# Patient Record
Sex: Female | Born: 1950 | Race: White | Hispanic: No | Smoking: Former smoker
Health system: Southern US, Community
[De-identification: ages and names within clinical notes are randomized; demographics above are authoritative.]

## PROBLEM LIST (undated history)

## (undated) DIAGNOSIS — C4491 Basal cell carcinoma of skin, unspecified: Secondary | ICD-10-CM

## (undated) DIAGNOSIS — C259 Malignant neoplasm of pancreas, unspecified: Secondary | ICD-10-CM

## (undated) DIAGNOSIS — R0789 Other chest pain: Secondary | ICD-10-CM

## (undated) DIAGNOSIS — IMO0001 Reserved for inherently not codable concepts without codable children: Secondary | ICD-10-CM

## (undated) DIAGNOSIS — F329 Major depressive disorder, single episode, unspecified: Secondary | ICD-10-CM

## (undated) DIAGNOSIS — K219 Gastro-esophageal reflux disease without esophagitis: Secondary | ICD-10-CM

## (undated) DIAGNOSIS — F32A Depression, unspecified: Secondary | ICD-10-CM

## (undated) DIAGNOSIS — J449 Chronic obstructive pulmonary disease, unspecified: Secondary | ICD-10-CM

## (undated) DIAGNOSIS — M549 Dorsalgia, unspecified: Secondary | ICD-10-CM

## (undated) DIAGNOSIS — R1084 Generalized abdominal pain: Secondary | ICD-10-CM

## (undated) DIAGNOSIS — I10 Essential (primary) hypertension: Secondary | ICD-10-CM

## (undated) DIAGNOSIS — F419 Anxiety disorder, unspecified: Secondary | ICD-10-CM

## (undated) DIAGNOSIS — R0609 Other forms of dyspnea: Secondary | ICD-10-CM

## (undated) DIAGNOSIS — R0600 Dyspnea, unspecified: Secondary | ICD-10-CM

## (undated) HISTORY — PX: CORONARY ARTERY BYPASS GRAFT: SHX141

## (undated) HISTORY — PX: TONSILLECTOMY: SUR1361

## (undated) HISTORY — DX: Gastro-esophageal reflux disease without esophagitis: K21.9

## (undated) HISTORY — PX: CARPAL TUNNEL RELEASE: SHX101

## (undated) HISTORY — PX: COLONOSCOPY: SHX174

## (undated) HISTORY — DX: Chronic obstructive pulmonary disease, unspecified: J44.9

---

## 1998-05-18 ENCOUNTER — Other Ambulatory Visit: Admission: RE | Admit: 1998-05-18 | Discharge: 1998-05-18 | Payer: Self-pay | Admitting: *Deleted

## 1998-05-31 ENCOUNTER — Ambulatory Visit (HOSPITAL_COMMUNITY): Admission: RE | Admit: 1998-05-31 | Discharge: 1998-05-31 | Payer: Self-pay | Admitting: *Deleted

## 1998-06-04 ENCOUNTER — Ambulatory Visit (HOSPITAL_COMMUNITY): Admission: RE | Admit: 1998-06-04 | Discharge: 1998-06-04 | Payer: Self-pay | Admitting: *Deleted

## 1998-06-04 ENCOUNTER — Encounter: Payer: Self-pay | Admitting: *Deleted

## 1998-06-07 ENCOUNTER — Encounter: Payer: Self-pay | Admitting: *Deleted

## 1998-06-09 ENCOUNTER — Ambulatory Visit (HOSPITAL_COMMUNITY): Admission: RE | Admit: 1998-06-09 | Discharge: 1998-06-09 | Payer: Self-pay | Admitting: *Deleted

## 2004-05-20 ENCOUNTER — Emergency Department: Payer: Self-pay | Admitting: Unknown Physician Specialty

## 2004-05-20 ENCOUNTER — Other Ambulatory Visit: Payer: Self-pay

## 2008-06-30 DIAGNOSIS — I10 Essential (primary) hypertension: Secondary | ICD-10-CM | POA: Insufficient documentation

## 2009-07-03 ENCOUNTER — Emergency Department: Payer: Self-pay | Admitting: Emergency Medicine

## 2010-04-13 ENCOUNTER — Emergency Department: Payer: Self-pay | Admitting: Emergency Medicine

## 2011-04-03 DIAGNOSIS — C4491 Basal cell carcinoma of skin, unspecified: Secondary | ICD-10-CM

## 2011-04-03 HISTORY — DX: Basal cell carcinoma of skin, unspecified: C44.91

## 2011-04-03 HISTORY — PX: BASAL CELL CARCINOMA EXCISION: SHX1214

## 2012-04-10 ENCOUNTER — Encounter (HOSPITAL_COMMUNITY): Payer: Self-pay | Admitting: Emergency Medicine

## 2012-04-10 ENCOUNTER — Emergency Department (HOSPITAL_COMMUNITY)
Admission: EM | Admit: 2012-04-10 | Discharge: 2012-04-10 | Disposition: A | Discharge status: Home / Self Care | Payer: Self-pay | Attending: Emergency Medicine | Admitting: Emergency Medicine

## 2012-04-10 DIAGNOSIS — Z8659 Personal history of other mental and behavioral disorders: Secondary | ICD-10-CM | POA: Insufficient documentation

## 2012-04-10 DIAGNOSIS — Z79899 Other long term (current) drug therapy: Secondary | ICD-10-CM | POA: Insufficient documentation

## 2012-04-10 DIAGNOSIS — R6889 Other general symptoms and signs: Secondary | ICD-10-CM | POA: Insufficient documentation

## 2012-04-10 DIAGNOSIS — IMO0001 Reserved for inherently not codable concepts without codable children: Secondary | ICD-10-CM | POA: Insufficient documentation

## 2012-04-10 DIAGNOSIS — R059 Cough, unspecified: Secondary | ICD-10-CM | POA: Insufficient documentation

## 2012-04-10 DIAGNOSIS — R899 Unspecified abnormal finding in specimens from other organs, systems and tissues: Secondary | ICD-10-CM

## 2012-04-10 DIAGNOSIS — M2550 Pain in unspecified joint: Secondary | ICD-10-CM | POA: Insufficient documentation

## 2012-04-10 DIAGNOSIS — Z87891 Personal history of nicotine dependence: Secondary | ICD-10-CM | POA: Insufficient documentation

## 2012-04-10 DIAGNOSIS — R5381 Other malaise: Secondary | ICD-10-CM | POA: Insufficient documentation

## 2012-04-10 DIAGNOSIS — R05 Cough: Secondary | ICD-10-CM | POA: Insufficient documentation

## 2012-04-10 DIAGNOSIS — R509 Fever, unspecified: Secondary | ICD-10-CM | POA: Insufficient documentation

## 2012-04-10 HISTORY — DX: Depression, unspecified: F32.A

## 2012-04-10 HISTORY — DX: Dorsalgia, unspecified: M54.9

## 2012-04-10 HISTORY — DX: Major depressive disorder, single episode, unspecified: F32.9

## 2012-04-10 LAB — CBC WITH DIFFERENTIAL/PLATELET
Eosinophils Absolute: 0.1 10*3/uL (ref 0.0–0.7)
Hemoglobin: 15.2 g/dL — ABNORMAL HIGH (ref 12.0–15.0)
Lymphocytes Relative: 31 % (ref 12–46)
Lymphs Abs: 2.2 10*3/uL (ref 0.7–4.0)
MCH: 31.1 pg (ref 26.0–34.0)
Monocytes Relative: 8 % (ref 3–12)
Neutro Abs: 4.2 10*3/uL (ref 1.7–7.7)
Neutrophils Relative %: 60 % (ref 43–77)
RBC: 4.89 MIL/uL (ref 3.87–5.11)
WBC: 7.1 10*3/uL (ref 4.0–10.5)

## 2012-04-10 LAB — URINALYSIS, ROUTINE W REFLEX MICROSCOPIC
Bilirubin Urine: NEGATIVE
Nitrite: NEGATIVE
Specific Gravity, Urine: <1.005 — ABNORMAL LOW (ref 1.005–1.030)
pH: 5.5 (ref 5.0–8.0)

## 2012-04-10 LAB — BASIC METABOLIC PANEL
BUN: 9 mg/dL (ref 6–23)
Chloride: 103 mEq/L (ref 96–112)
GFR calc Af Amer: >90 mL/min (ref >90)
Glucose, Bld: 87 mg/dL (ref 70–99)
Potassium: 4.5 mEq/L (ref 3.5–5.1)

## 2012-04-10 NOTE — ED Provider Notes (Signed)
History     CSN: [id number]  Arrival date & time 04/10/12  4098   First MD Initiated Contact with Patient 04/10/12 [id number]      Chief Complaint  Patient presents with  . Abnormal Lab    (Consider location/radiation/quality/duration/timing/severity/associated sxs/prior treatment) HPI Comments: Patient is a 62 year old female who presented to her primary physician earlier this week due to to generally not feeling well, having body aches, and at times coughing up phlegm with clots of blood in it. She was told by her physician that she may have a" bacterial infection" and she was started on antibiotics. The patient also received blood work at that time. The patient was called by her physicians and told that her potassium was 6.6 and she should go to the emergency department on last evening. The patient did not have a ride and presents to the emergency department today for evaluation of this elevated potassium. A the patient denies palpitations. There's been no numbness or tingling. There has been some fatigue and weakness present. The patient denies any polyuria or excessive thirst. She has not taken anything for this issue at this time.  The history is provided by the patient.    Past Medical History  Diagnosis Date  . Back pain   . Depression     Past Surgical History  Procedure Date  . Basal cell carcinoma excision     History reviewed. No pertinent family history.  History  Substance Use Topics  . Smoking status: Former Smoker    Quit date: 03/02/2012  . Smokeless tobacco: Not on file  . Alcohol Use: No    OB History    Grav Para Term Preterm Abortions TAB SAB Ect Mult Living                  Review of Systems  Constitutional: Positive for fever and fatigue. Negative for activity change.       All ROS Neg except as noted in HPI  HENT: Negative for nosebleeds and neck pain.   Eyes: Negative for photophobia and discharge.  Respiratory: Positive for cough. Negative for  shortness of breath and wheezing.   Cardiovascular: Negative for chest pain and palpitations.  Gastrointestinal: Negative for abdominal pain and blood in stool.  Genitourinary: Negative for dysuria, frequency and hematuria.  Musculoskeletal: Positive for myalgias and arthralgias. Negative for back pain.  Skin: Negative.   Neurological: Negative for dizziness, seizures and speech difficulty.  Psychiatric/Behavioral: Negative for hallucinations and confusion.    Allergies  Cortisone; Penicillins; and Tramadol  Home Medications   Current Outpatient Rx  Name  Route  Sig  Dispense  Refill  . DICYCLOMINE HCL 10 MG PO CAPS   Oral   Take 10 mg by mouth 4 (four) times daily.         Marland Kitchen METRONIDAZOLE 500 MG PO TABS   Oral   Take 500 mg by mouth 2 (two) times daily. For 7 days started on 04/08/12         . QUETIAPINE FUMARATE 100 MG PO TABS   Oral   Take 50 mg by mouth at bedtime.           BP 159/117  Pulse 102  SpO2 98%  Physical Exam  Nursing note and vitals reviewed. Constitutional: She is oriented to person, place, and time. She appears well-developed and well-nourished.  Non-toxic appearance.  HENT:  Head: Normocephalic.  Right Ear: Tympanic membrane and external ear normal.  Left Ear: Tympanic membrane  and external ear normal.  Eyes: EOM and lids are normal. Pupils are equal, round, and reactive to light.  Neck: Normal range of motion. Neck supple. Carotid bruit is not present.  Cardiovascular: Normal rate, regular rhythm, normal heart sounds, intact distal pulses and normal pulses.   Pulmonary/Chest: No respiratory distress.       Course breath sounds, no focal consolidation. Symmetrical rise and fall of the chest. Pt speaking in complete sentences.  Abdominal: Soft. Bowel sounds are normal. There is no tenderness. There is no guarding.  Musculoskeletal: Normal range of motion.  Lymphadenopathy:       Head (right side): No submandibular adenopathy present.        Head (left side): No submandibular adenopathy present.    She has no cervical adenopathy.  Neurological: She is alert and oriented to person, place, and time. She has normal strength. No cranial nerve deficit or sensory deficit.  Skin: Skin is warm and dry.  Psychiatric: Her speech is normal. Her mood appears anxious.    ED Course  Procedures (including critical care time)   Labs Reviewed  BASIC METABOLIC PANEL   No results found.   No diagnosis found.    MDM  I have reviewed nursing notes, vital signs, and all appropriate lab and imaging results for this patient. Patient conversing with family member during the entire emergency room visit in complete sentences without any problem whatsoever. There was no hemoptysis noted during this emergency department visit. The patient was told that her potassium was elevated at 6.6 by her primary care physician. The physician requested the patient be evaluated and recheck. The electrocardiogram showed no peak T waves or other abnormalities. The basic metabolic panel was completely normal the potassium at that time was 4.5. The urine analysis was well within normal limits. In the complete blood count was normal.  The patient was informed of these findings. The patient ambulated in the [facility] without any problem whatsoever. It was felt to be safe for the patient be discharged home she will have close followup by her primary care physician.       Kathie Dike, [place] 04/16/12 2225

## 2012-04-10 NOTE — ED Notes (Signed)
Pt went to doctor due to coughing up blood clot. ptstaes also has had diffuse generalized body aches for months. Pt states doctor told her she had bacterial infection and started her on antibiotics. Pt states doctors called and told her K+ level was 6.6.

## 2012-04-17 NOTE — ED Provider Notes (Signed)
Medical screening examination/treatment/procedure(s) were performed by non-physician practitioner and as supervising physician I was immediately available for consultation/collaboration.    [name] M [name], DO 04/17/12 1716 

## 2013-09-10 DIAGNOSIS — J449 Chronic obstructive pulmonary disease, unspecified: Secondary | ICD-10-CM | POA: Insufficient documentation

## 2014-12-27 ENCOUNTER — Ambulatory Visit: Payer: Self-pay | Attending: Oncology

## 2015-01-03 ENCOUNTER — Ambulatory Visit
Admission: RE | Admit: 2015-01-03 | Discharge: 2015-01-03 | Disposition: A | Discharge status: Home / Self Care | Payer: Self-pay | Source: Ambulatory Visit | Attending: Oncology | Admitting: Oncology

## 2015-01-03 ENCOUNTER — Encounter: Payer: Self-pay | Admitting: *Deleted

## 2015-01-03 ENCOUNTER — Ambulatory Visit: Payer: Self-pay | Attending: Oncology | Admitting: *Deleted

## 2015-01-03 VITALS — BP 145/95 | HR 74 | Temp 96.3°F | Ht 63.0 in | Wt 117.0 lb

## 2015-01-03 DIAGNOSIS — Z1231 Encounter for screening mammogram for malignant neoplasm of breast: Secondary | ICD-10-CM | POA: Insufficient documentation

## 2015-01-03 DIAGNOSIS — Z0000 Encounter for general adult medical examination without abnormal findings: Secondary | ICD-10-CM

## 2015-01-03 NOTE — Progress Notes (Signed)
Subjective:     Patient ID: Shirley Barton, female   DOB: 1950/12/26, 64 y.o.   MRN: [id number]  HPI   Review of Systems     Objective:   Physical Exam  Pulmonary/Chest: Right breast exhibits no inverted nipple, no mass, no nipple discharge, no skin change and no tenderness. Left breast exhibits no inverted nipple, no mass, no nipple discharge, no skin change and no tenderness. Breasts are symmetrical.  Bilateral induration noted under bilateral nipples when patient raises her arms       Assessment:     64 year old White female presents to [facility] for clinical breast exam and mammogram.  Patient has never had a mammogram.  Taught self breast awareness.  Patient had a flat affect.  Stated she had depression but could not take her meds because they made her sick.  On clinical breast exam the patient could barely raise her arms, and could only get them to a 45 degree position, due to pain.  Asked if she could tolerate a mammogram, and she did states she would do it.   The breast exam is unremarkable except for bilateral tenderness.  Taught self breast awareness.  Patient has been screened for eligibility.  She does not have any insurance, Medicare or Medicaid.  She also meets financial eligibility.  Hand-out given on the Affordable Care Act.     Plan:     Screening mammogram ordered.  Numbers given to [facility] clinics for further evaluation and treatment of her depression.  Will follow-up per protocol.

## 2015-01-04 ENCOUNTER — Encounter: Payer: Self-pay | Admitting: *Deleted

## 2015-01-04 NOTE — Progress Notes (Signed)
Letter mailed from the [facility] to inform patient of her normal mammogram results.  Patient is to follow-up with annual screening in one year.  HSIS to Christy. 

## 2015-02-07 IMAGING — US US ABDOMEN LIMITED
1 series · 14 of 25 positions shown · non-contrast
Comparison: None.

CLINICAL DATA: Initial evaluation for acute right-sided abdominal
pain, vomiting.

EXAM:
US ABDOMEN LIMITED - RIGHT UPPER QUADRANT

[Series 1: us abdomen limited · 0.22mm/px · 14 of 44 slices shown]
[im 1/44]
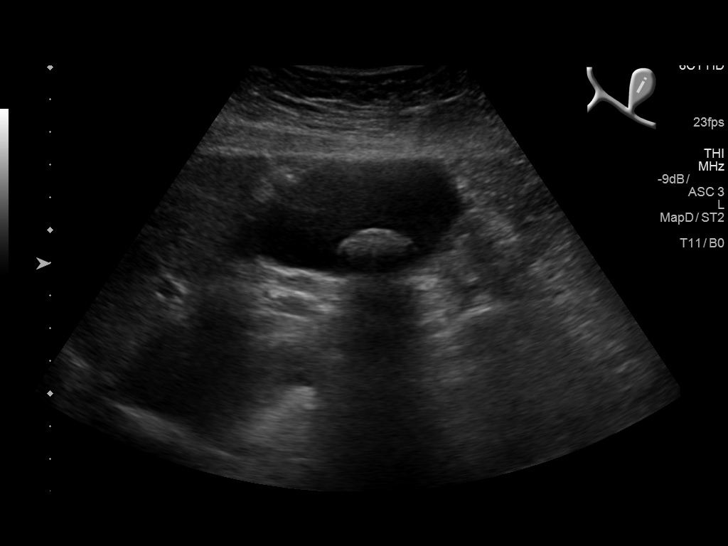
[im 4/44]
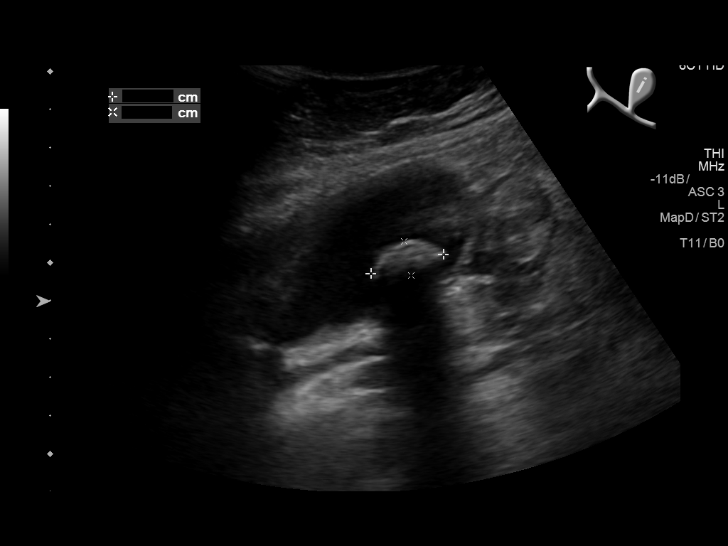
[im 8/44]
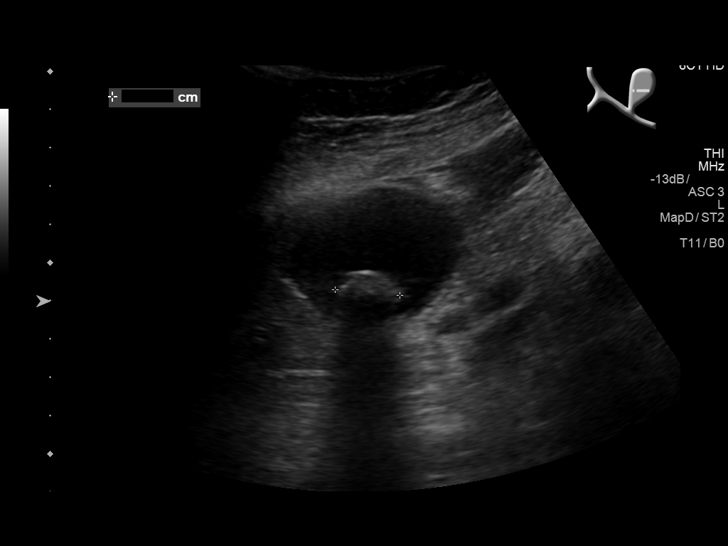
[im 11/44]
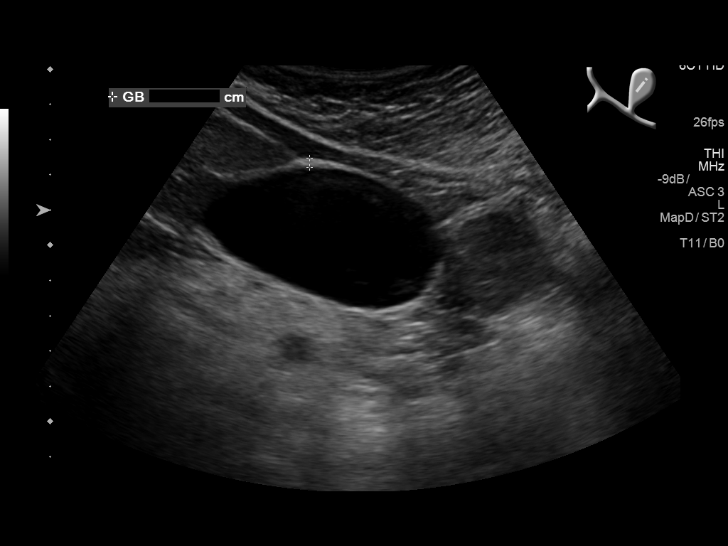
[im 15/44]
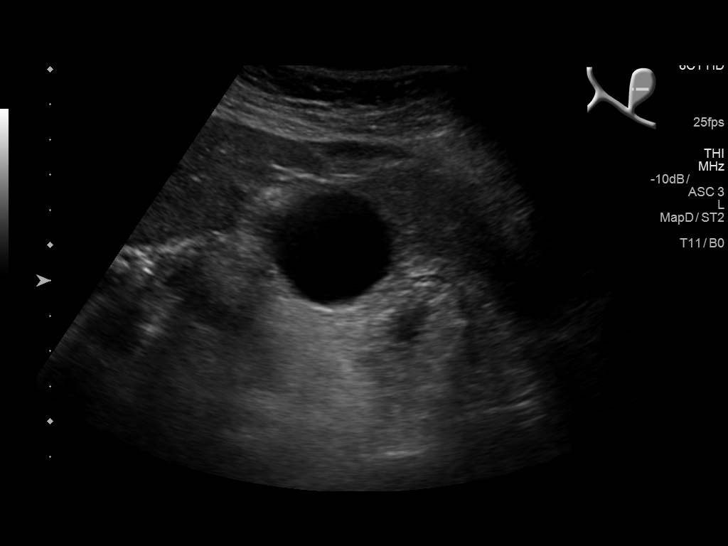
[im 17/44]
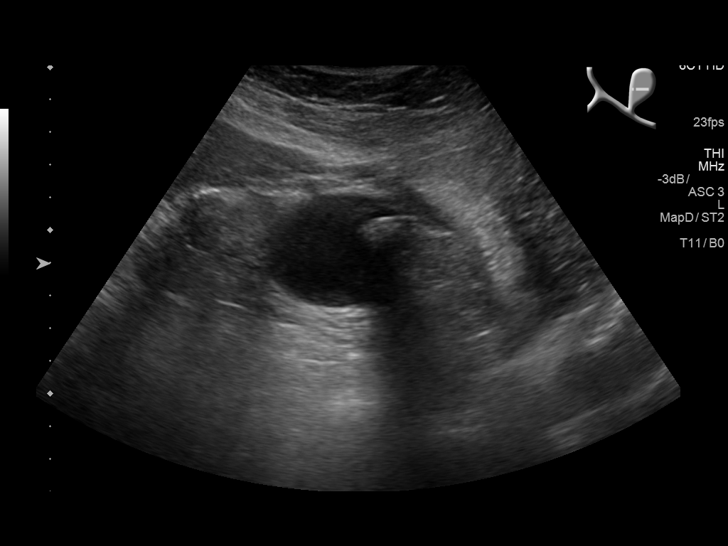
[im 20/44]
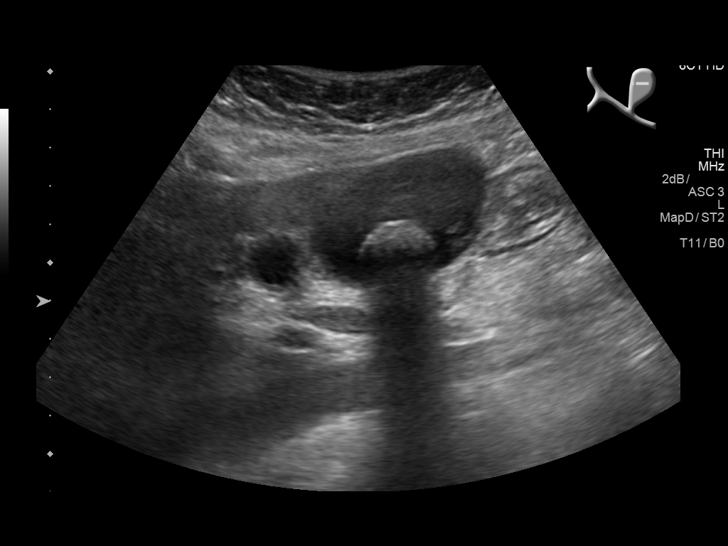
[im 24/44]
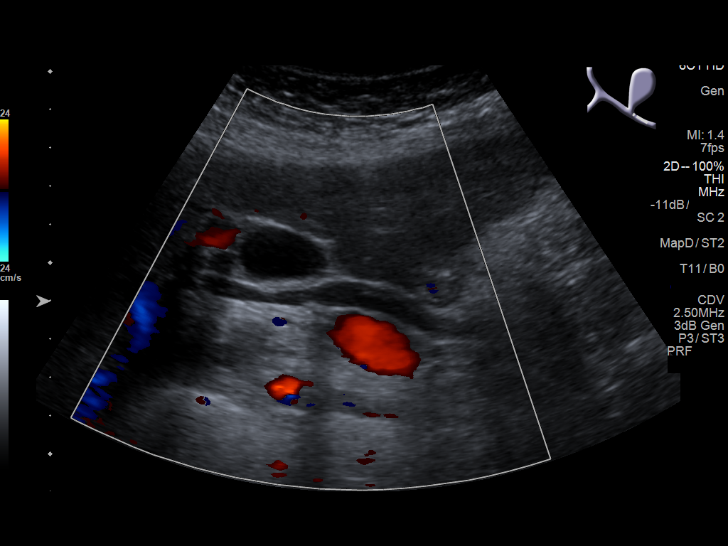
[im 27/44]
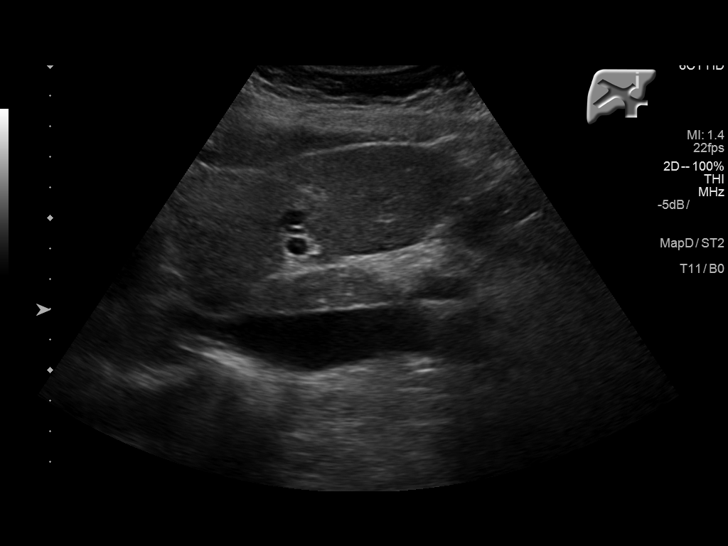
[im 29/44]
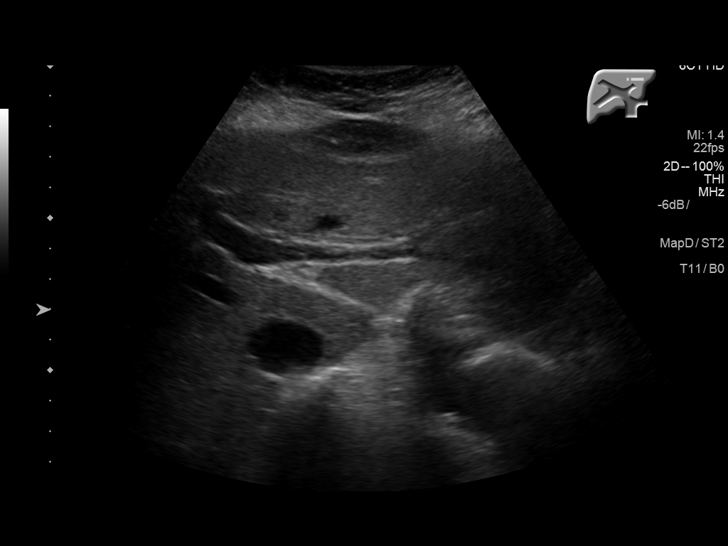
[im 33/44]
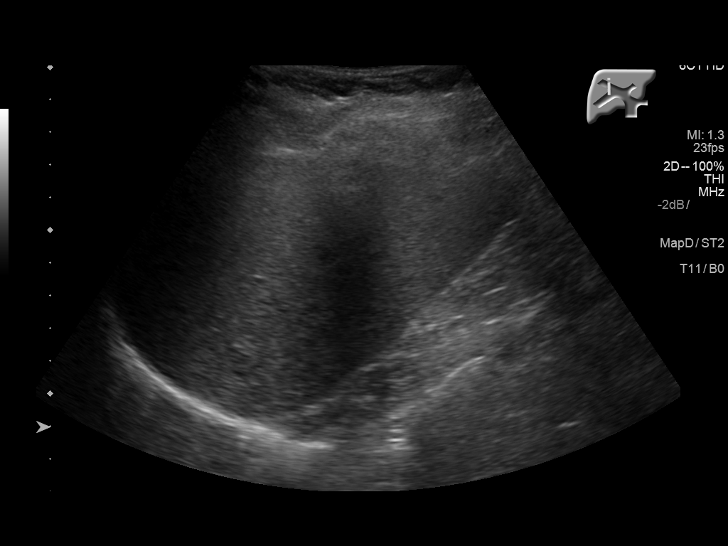
[im 36/44]
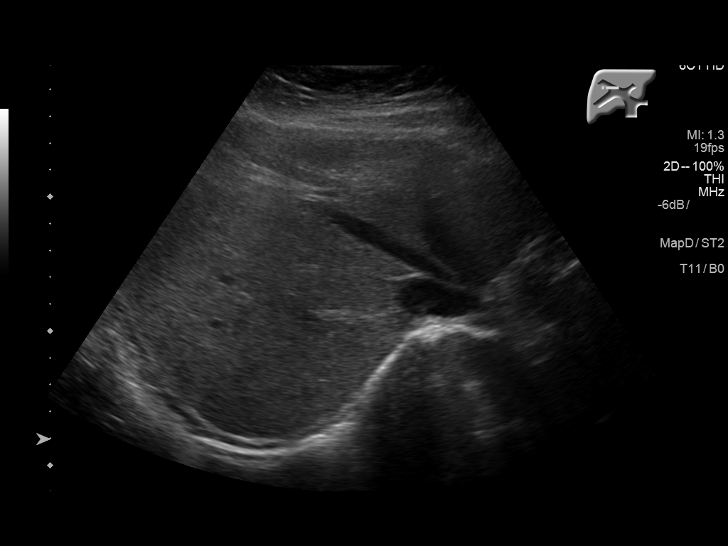
[im 40/44]
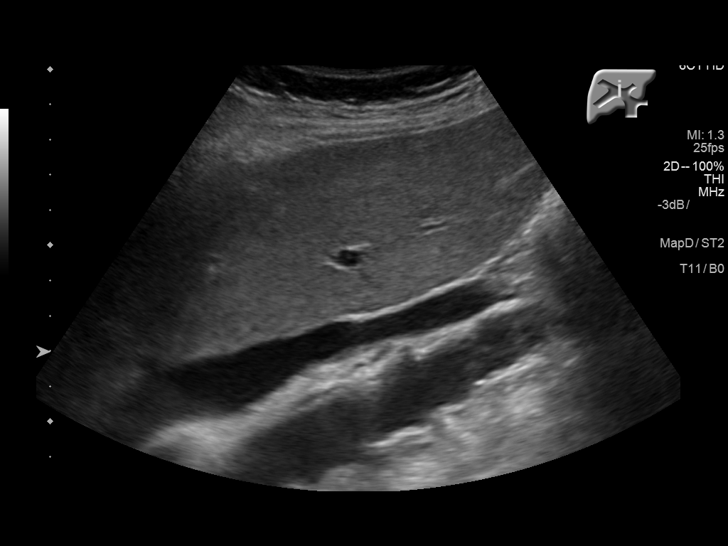
[im 44/44]
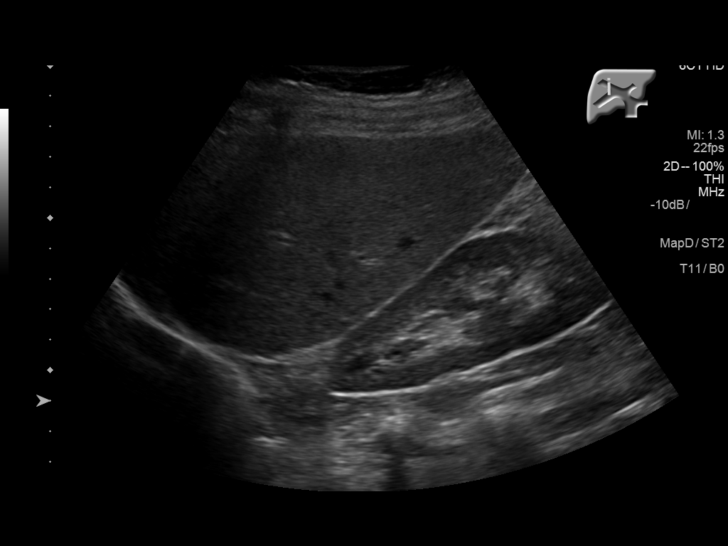

[14 of 25 positions shown; findings below may reference images not displayed]

FINDINGS: Gallbladder:

2 cm shadowing echogenic stone present within the gallbladder lumen.
Small amount of gallbladder sludge. Gallbladder wall measure within
normal limits at 2-3 mm. No free pericholecystic fluid. The
evaluation for sonographic Murphy's sign limited as the patient was
medicated.

Common bile duct:

Diameter: 6 mm

Liver:

No focal lesion identified. Within normal limits in parenchymal
echogenicity.
IMPRESSION: Cholelithiasis with gallbladder sludge. No other sonographic
evidence for acute cholecystitis. No biliary dilatation.

## 2015-08-28 ENCOUNTER — Emergency Department: Payer: Medicare Other

## 2015-08-28 ENCOUNTER — Encounter: Payer: Self-pay | Admitting: Radiology

## 2015-08-28 ENCOUNTER — Emergency Department
Admission: EM | Admit: 2015-08-28 | Discharge: 2015-08-28 | Disposition: A | Discharge status: Home / Self Care | Payer: Medicare Other | Attending: Emergency Medicine | Admitting: Emergency Medicine

## 2015-08-28 DIAGNOSIS — F329 Major depressive disorder, single episode, unspecified: Secondary | ICD-10-CM | POA: Diagnosis not present

## 2015-08-28 DIAGNOSIS — Z87891 Personal history of nicotine dependence: Secondary | ICD-10-CM | POA: Insufficient documentation

## 2015-08-28 DIAGNOSIS — R1031 Right lower quadrant pain: Secondary | ICD-10-CM | POA: Diagnosis present

## 2015-08-28 DIAGNOSIS — K8020 Calculus of gallbladder without cholecystitis without obstruction: Secondary | ICD-10-CM | POA: Diagnosis not present

## 2015-08-28 DIAGNOSIS — R109 Unspecified abdominal pain: Secondary | ICD-10-CM

## 2015-08-28 LAB — CBC
HCT: 47 % (ref 35.0–47.0)
Hemoglobin: 15.9 g/dL (ref 12.0–16.0)
MCH: 29.9 pg (ref 26.0–34.0)
MCHC: 33.9 g/dL (ref 32.0–36.0)
MCV: 88.2 fL (ref 80.0–100.0)
PLATELETS: 177 10*3/uL (ref 150–440)
RBC: 5.33 MIL/uL — ABNORMAL HIGH (ref 3.80–5.20)
RDW: 13.3 % (ref 11.5–14.5)
WBC: 9.7 10*3/uL (ref 3.6–11.0)

## 2015-08-28 LAB — COMPREHENSIVE METABOLIC PANEL
ALT: 17 U/L (ref 14–54)
AST: 22 U/L (ref 15–41)
Albumin: 4.3 g/dL (ref 3.5–5.0)
Alkaline Phosphatase: 54 U/L (ref 38–126)
Anion gap: 9 (ref 5–15)
BILIRUBIN TOTAL: 0.6 mg/dL (ref 0.3–1.2)
BUN: 12 mg/dL (ref 6–20)
CALCIUM: 9.3 mg/dL (ref 8.9–10.3)
CO2: 26 mmol/L (ref 22–32)
CREATININE: 0.86 mg/dL (ref 0.44–1.00)
Chloride: 103 mmol/L (ref 101–111)
GFR calc Af Amer: >60 mL/min (ref >60)
Glucose, Bld: 113 mg/dL — ABNORMAL HIGH (ref 65–99)
POTASSIUM: 3.7 mmol/L (ref 3.5–5.1)
Sodium: 138 mmol/L (ref 135–145)
TOTAL PROTEIN: 7.4 g/dL (ref 6.5–8.1)

## 2015-08-28 LAB — LIPASE, BLOOD: Lipase: 32 U/L (ref 11–51)

## 2015-08-28 LAB — TROPONIN I: Troponin I: <0.03 ng/mL (ref <0.031)

## 2015-08-28 LAB — URINALYSIS COMPLETE WITH MICROSCOPIC (ARMC ONLY)
BILIRUBIN URINE: NEGATIVE
Bacteria, UA: NONE SEEN
GLUCOSE, UA: NEGATIVE mg/dL
Ketones, ur: NEGATIVE mg/dL
NITRITE: NEGATIVE
Protein, ur: NEGATIVE mg/dL
SPECIFIC GRAVITY, URINE: 1.014 (ref 1.005–1.030)
pH: 5 (ref 5.0–8.0)

## 2015-08-28 IMAGING — CT CT ABD-PELV W/ CM
2 of 5 series · 16 of 46 positions shown, 18 images · IV contrast (iopamidol)
Comparison: None.

CLINICAL DATA: Right-sided abdominal pain 6 days.

EXAM:
CT ABDOMEN AND PELVIS WITH CONTRAST
TECHNIQUE: Multidetector CT imaging of the abdomen and pelvis was performed
using the standard protocol following bolus administration of
intravenous contrast.
CONTRAST:  100mL [UI] IOPAMIDOL ([UI]) INJECTION 61%

[Series 2: routine abd pel with · axial · 0.59mm/px · z∈[-662,-308]mm · 13 of 81 slices shown, 15 images]
[im 5/81  soft-tissue]
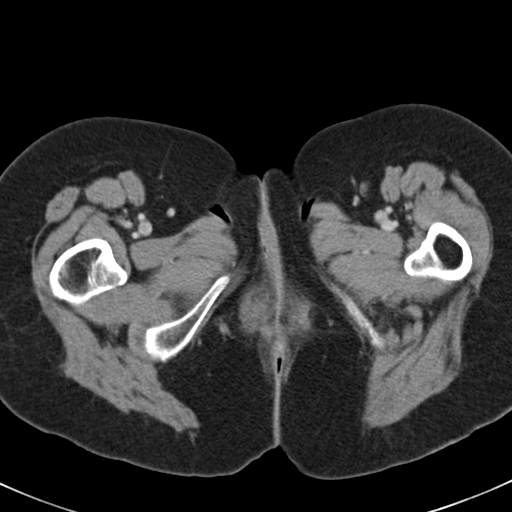
[im 5/81  bone]
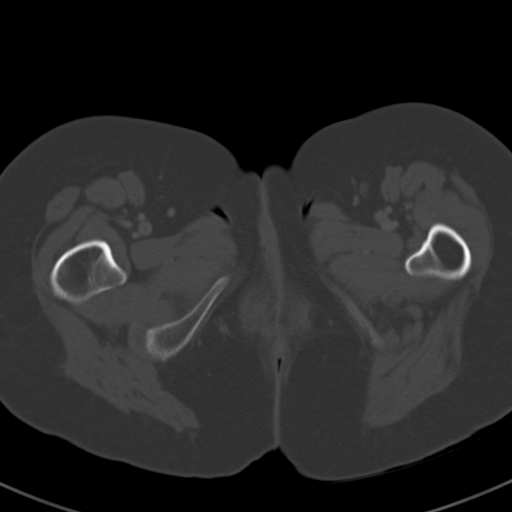
[im 13/81  soft-tissue]
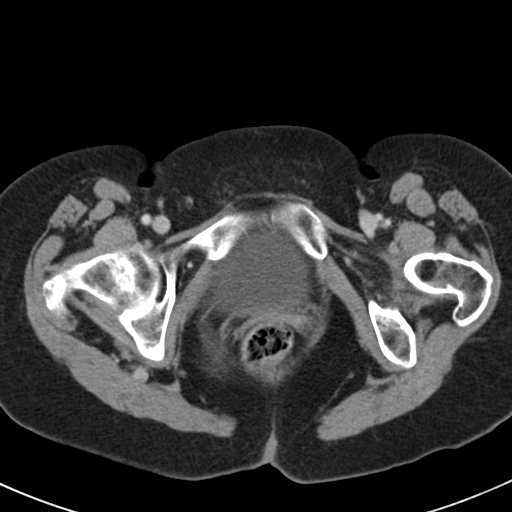
[im 17/81  soft-tissue]
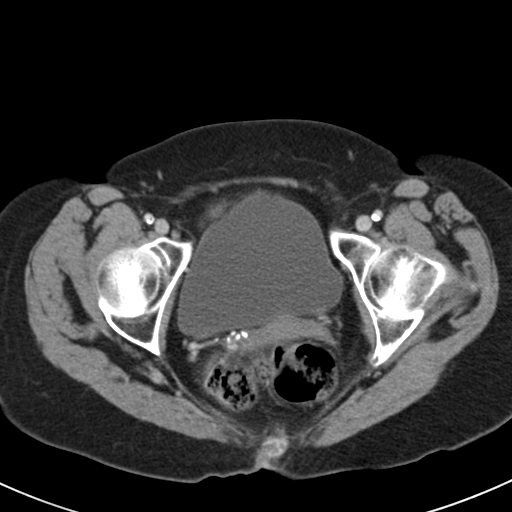
[im 22/81  soft-tissue]
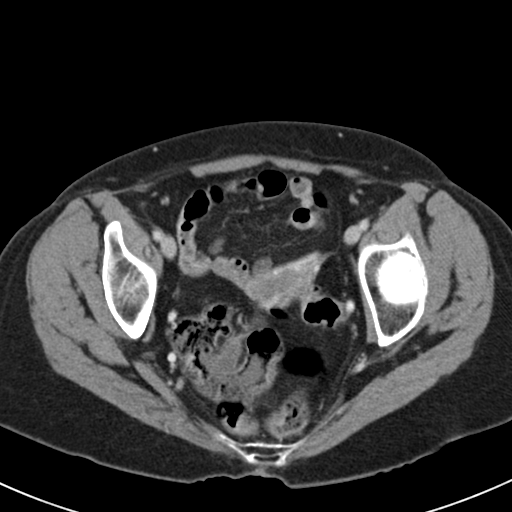
[im 30/81  soft-tissue]
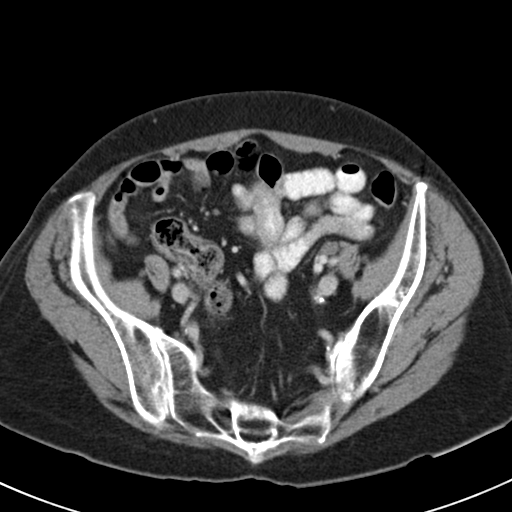
[im 34/81  soft-tissue]
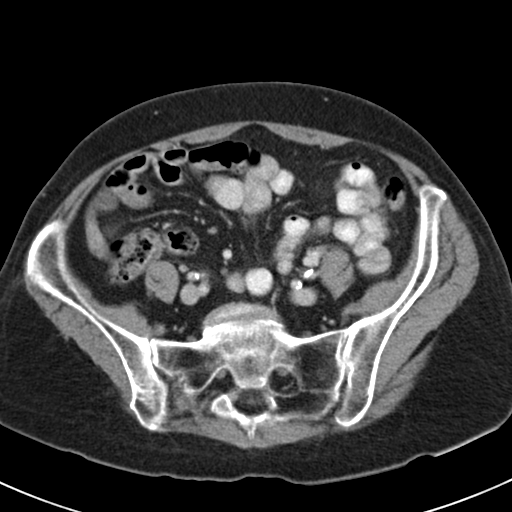
[im 43/81  soft-tissue]
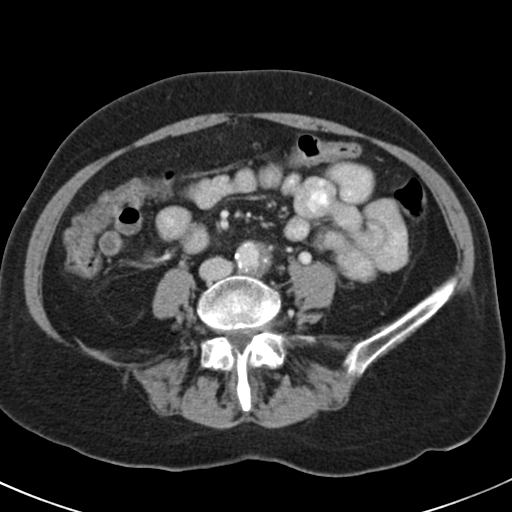
[im 47/81  soft-tissue]
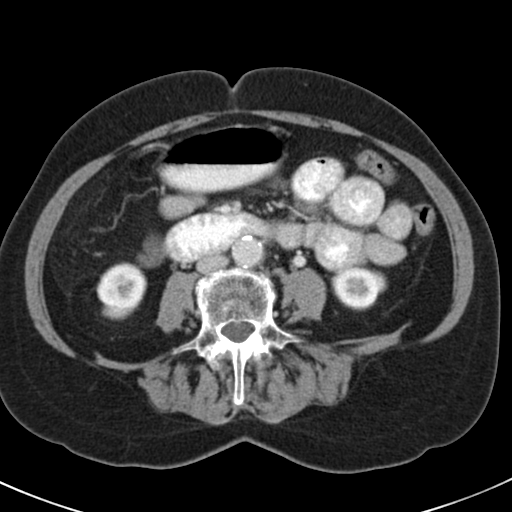
[im 51/81  soft-tissue]
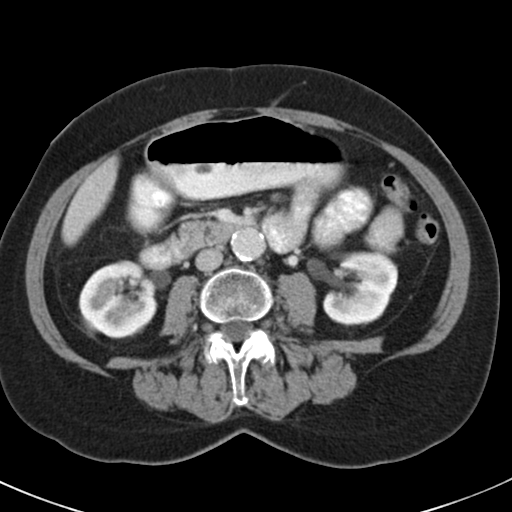
[im 51/81  bone]
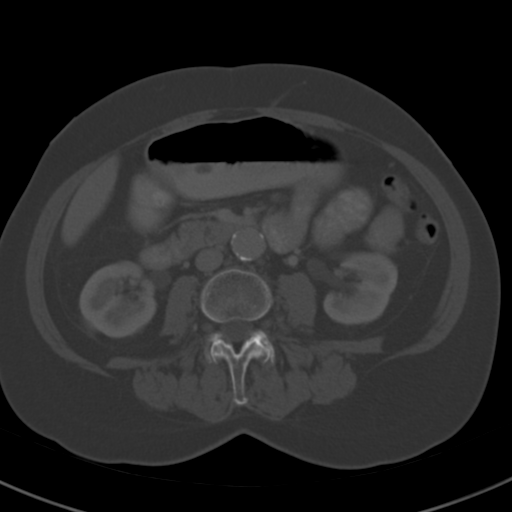
[im 59/81  soft-tissue]
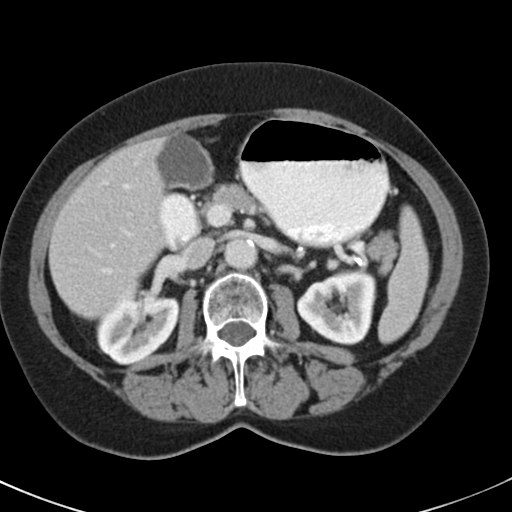
[im 64/81  soft-tissue]
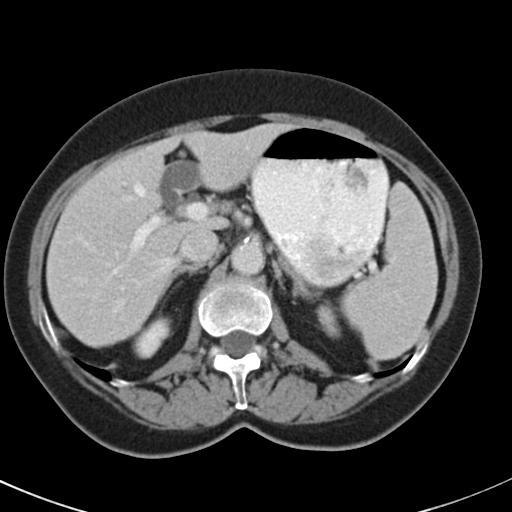
[im 68/81  soft-tissue]
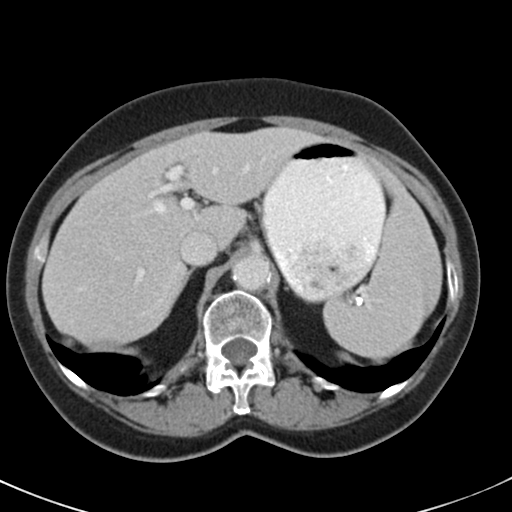
[im 76/81  soft-tissue]
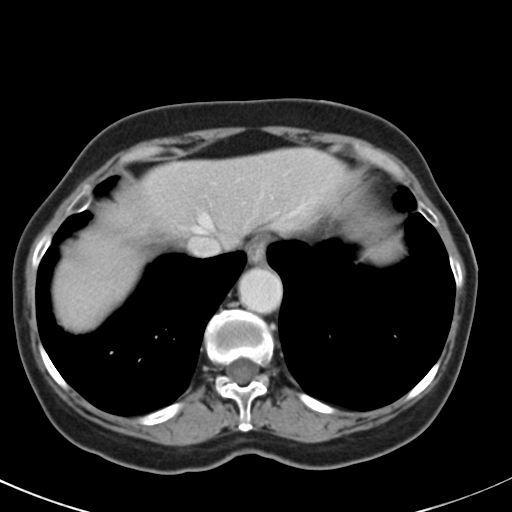

[Series 5: cor routine abd pel with · coronal · 0.56mm/px · 3 of 118 slices shown]
[im 40/118  soft-tissue]
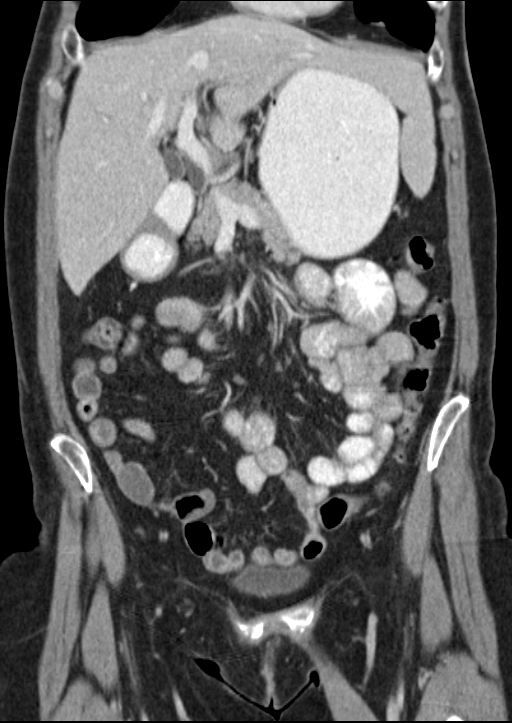
[im 53/118  soft-tissue]
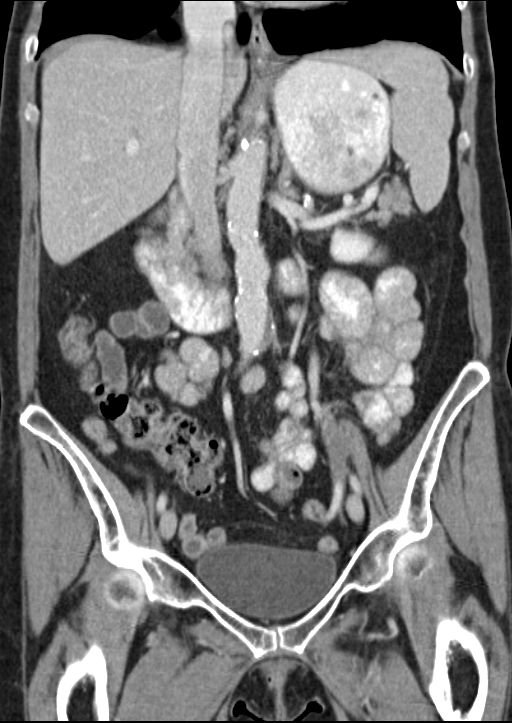
[im 66/118  soft-tissue]
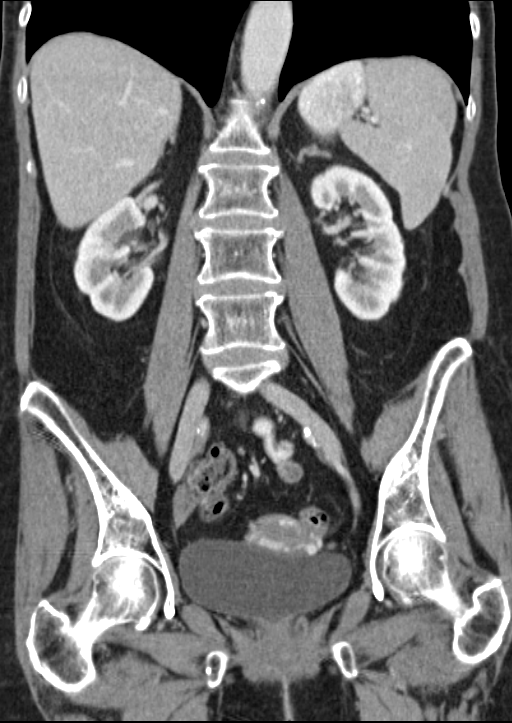

[16 of 46 positions shown; findings below may reference images not displayed]

FINDINGS: Lower chest:  Lung bases clear.  No infiltrate or effusion.

Hepatobiliary: Liver normal in size and contour. No liver lesion.
Gallbladder and bile ducts normal.

Pancreas: Negative

Spleen: Negative

Adrenals/Urinary Tract: Normal and symmetric renal excretion of
contrast. No renal mass or obstruction. No urinary tract calculi.
Urinary bladder normal.

Stomach/Bowel: Negative for bowel obstruction. No bowel mass or
edema. Normal appendix. Cecum deep in the pelvis. Negative for
diverticulitis.

Vascular/Lymphatic: Atherosclerotic calcification in the aorta and
iliacs. No aneurysm. No lymphadenopathy.

Reproductive: Atrophic uterus without mass lesion. Prominent veins
around the uterus and right adnexa. Prominent left gonadal vein. No
pelvic mass.

Other: No free-fluid

Musculoskeletal: No significant skeletal abnormality.
IMPRESSION: No acute abnormality. No cause for right abdominal pain identified.
Normal appendix.

## 2015-08-28 MED ORDER — ONDANSETRON HCL 4 MG/2ML IJ SOLN
4.0000 mg | Freq: Once | INTRAMUSCULAR | Status: AC
  Administered 2015-08-28: 4 mg via INTRAVENOUS

## 2015-08-28 MED ORDER — ONDANSETRON 4 MG PO TBDP: 4.0000 mg | ORAL_TABLET | Freq: Four times a day (QID) | ORAL | Status: DC | PRN

## 2015-08-28 MED ORDER — DIATRIZOATE MEGLUMINE & SODIUM 66-10 % PO SOLN
15.0000 mL | Freq: Once | ORAL | Status: AC
  Administered 2015-08-28: 15 mL via ORAL

## 2015-08-28 MED ORDER — MORPHINE SULFATE (PF) 2 MG/ML IV SOLN
INTRAVENOUS | Status: AC
  Filled 2015-08-28: qty 1

## 2015-08-28 MED ORDER — IOPAMIDOL (ISOVUE-300) INJECTION 61%
100.0000 mL | Freq: Once | INTRAVENOUS | Status: AC | PRN
  Administered 2015-08-28: 100 mL via INTRAVENOUS
  Filled 2015-08-28: qty 100

## 2015-08-28 MED ORDER — MORPHINE SULFATE (PF) 4 MG/ML IV SOLN
4.0000 mg | Freq: Once | INTRAVENOUS | Status: AC
  Administered 2015-08-28: 4 mg via INTRAVENOUS

## 2015-08-28 MED ORDER — SODIUM CHLORIDE 0.9 % IV BOLUS (SEPSIS)
1000.0000 mL | Freq: Once | INTRAVENOUS | Status: AC
  Administered 2015-08-28: 1000 mL via INTRAVENOUS

## 2015-08-28 MED ORDER — ONDANSETRON HCL 4 MG/2ML IJ SOLN
INTRAMUSCULAR | Status: AC
  Filled 2015-08-28: qty 2

## 2015-08-28 MED ORDER — MORPHINE SULFATE (PF) 2 MG/ML IV SOLN
2.0000 mg | Freq: Once | INTRAVENOUS | Status: AC
  Administered 2015-08-28: 2 mg via INTRAVENOUS

## 2015-08-28 MED ORDER — MORPHINE SULFATE (PF) 4 MG/ML IV SOLN
INTRAVENOUS | Status: AC
  Administered 2015-08-28: 4 mg via INTRAVENOUS
  Filled 2015-08-28: qty 1

## 2015-08-28 NOTE — ED Notes (Addendum)
Pt reports right side abd pain for past 6 days and long with multiple episode of vomiting. Denies diarrhea.

## 2015-08-28 NOTE — ED Provider Notes (Signed)
[facility] Emergency Department Provider Note  ____________________________________________  Time seen: Approximately 5:12 PM  I have reviewed the triage vital signs and the nursing notes.   HISTORY  Chief Complaint Abdominal Pain    HPI Shirley Barton is a 65 y.o. female history of hypertension, depression, and chronic lower back pain.  Patient reports she's had increasing pain over the right side of the abdomen with some nausea, occasional episodes of vomiting, and increasing discomfort primarily over the right lower abdomen. She thought she might have "appendicitis."  She reports she has vomited up acid. She is having some radiation of pain into the right lower back. No injury. No weakness numbness or tingling in the lower legs. She has chronic poor circulation in the feet, but reports no changes.     Past Medical History  Diagnosis Date  . Back pain   . Depression     There are no active problems to display for this patient.   Past Surgical History  Procedure Laterality Date  . Basal cell carcinoma excision      Current Outpatient Rx  Name  Route  Sig  Dispense  Refill  . dicyclomine (BENTYL) 10 MG capsule   Oral   Take 10 mg by mouth 4 (four) times daily.         . metroNIDAZOLE (FLAGYL) 500 MG tablet   Oral   Take 500 mg by mouth 2 (two) times daily. For 7 days started on 04/08/12         . ondansetron (ZOFRAN ODT) 4 MG disintegrating tablet   Oral   Take 1 tablet (4 mg total) by mouth every 6 (six) hours as needed for nausea or vomiting.   20 tablet   0   . QUEtiapine (SEROQUEL) 100 MG tablet   Oral   Take 50 mg by mouth at bedtime.           Allergies Cortisone; Tramadol; Cortizone-10; and Penicillins  No family history on file.  Social History Social History  Substance Use Topics  . Smoking status: Former Smoker    Quit date: 03/02/2012  . Smokeless tobacco: None  . Alcohol Use: No    Review of  Systems Constitutional: No fever/chills Eyes: No visual changes. ENT: No sore throat. Cardiovascular: Denies chest pain. Respiratory: Denies shortness of breath. Gastrointestinal: No abdominal pain.  No nausea, no vomiting.  No diarrhea.  No constipation. Genitourinary: Negative for dysuria. Musculoskeletal: Negative for back pain. Skin: Negative for rash. Neurological: Negative for headaches, focal weakness or numbness.  10-point ROS otherwise negative.  ____________________________________________   PHYSICAL EXAM:  VITAL SIGNS: ED Triage Vitals  Enc Vitals Group     BP 08/28/15 1531 155/103 mmHg     Pulse Rate 08/28/15 1531 99     Resp 08/28/15 1531 18     Temp 08/28/15 1531 98.7 F (37.1 C)     Temp Source 08/28/15 1531 Oral     SpO2 08/28/15 1531 95 %     Weight 08/28/15 1531 120 lb (54.432 kg)     Height 08/28/15 1531 5\' 3"  (1.6 m)     Head Cir --      Peak Flow --      Pain Score 08/28/15 1534 10     Pain Loc --      Pain Edu? --      Excl. in [place]? --    Constitutional: Alert and oriented. Well appearing and in no acute distress. Eyes:  Conjunctivae are normal. PERRL. EOMI. Head: Atraumatic. Nose: No congestion/rhinnorhea. Mouth/Throat: Mucous membranes are moist.  Oropharynx non-erythematous. Neck: No stridor.   Cardiovascular: Normal rate, regular rhythm. Grossly normal heart sounds.  Good peripheral circulation. Respiratory: Normal respiratory effort.  No retractions. Lungs CTAB. Gastrointestinal: Soft and nontenderExcept for some moderate discomfort to palpation of the right lower quadrant and right flank without rebound or guarding.. No distention. No abdominal bruits. No CVA tenderness. Musculoskeletal: No lower extremity tenderness nor edema.  No joint effusions.No lumbar or thoracic tenderness. Normal palpable dorsalis pedis and posterior tibial pulses bilateral. 5 out of 5 strength with normal sensation in lower extremities bilateral. Neurologic:  Normal  speech and language. No gross focal neurologic deficits are appreciated. No gait instability. Skin:  Skin is warm, dry and intact. No rash noted. Psychiatric: Mood and affect are normal. Speech and behavior are normal.  ____________________________________________   LABS (all labs ordered are listed, but only abnormal results are displayed)  Labs Reviewed  COMPREHENSIVE METABOLIC PANEL - Abnormal; Notable for the following:    Glucose, Bld 113 (*)    All other components within normal limits  CBC - Abnormal; Notable for the following:    RBC 5.33 (*)    All other components within normal limits  URINALYSIS COMPLETEWITH MICROSCOPIC ([facility] ONLY) - Abnormal; Notable for the following:    Color, Urine YELLOW (*)    APPearance CLEAR (*)    Hgb urine dipstick 2+ (*)    Leukocytes, UA TRACE (*)    Squamous Epithelial / LPF 0-5 (*)    All other components within normal limits  LIPASE, BLOOD  TROPONIN I   ____________________________________________  EKG  ED ECG REPORT I, Kyel Purk, the attending physician, personally viewed and interpreted this ECG.  Date: 08/28/2015 EKG Time: 1540 Rate: 100 Rhythm: normal sinus rhythm QRS Axis: normal Intervals: normal ST/T Wave abnormalities: normal Conduction Disturbances: none Narrative Interpretation: Sinus tachycardia, otherwise normal ____________________________________________  RADIOLOGY  CT Abdomen Pelvis W Contrast (Final result) Result time: 08/28/15 18:29:04   Final result by Rad Results In Interface (08/28/15 18:29:04)   Narrative:   CLINICAL DATA: Right-sided abdominal pain 6 days.  EXAM: CT ABDOMEN AND PELVIS WITH CONTRAST  TECHNIQUE: Multidetector CT imaging of the abdomen and pelvis was performed using the standard protocol following bolus administration of intravenous contrast.  CONTRAST: 176mL ISOVUE-300 IOPAMIDOL (ISOVUE-300) INJECTION 61%  COMPARISON: None.  FINDINGS: Lower chest: Lung bases  clear. No infiltrate or effusion.  Hepatobiliary: Liver normal in size and contour. No liver lesion. Gallbladder and bile ducts normal.  Pancreas: Negative  Spleen: Negative  Adrenals/Urinary Tract: Normal and symmetric renal excretion of contrast. No renal mass or obstruction. No urinary tract calculi. Urinary bladder normal.  Stomach/Bowel: Negative for bowel obstruction. No bowel mass or edema. Normal appendix. Cecum deep in the pelvis. Negative for diverticulitis.  Vascular/Lymphatic: Atherosclerotic calcification in the aorta and iliacs. No aneurysm. No lymphadenopathy.  Reproductive: Atrophic uterus without mass lesion. Prominent veins around the uterus and right adnexa. Prominent left gonadal vein. No pelvic mass.  Other: No free-fluid  Musculoskeletal: No significant skeletal abnormality.  IMPRESSION: No acute abnormality. No cause for right abdominal pain identified. Normal appendix.   Electronically Signed By: Franchot Gallo M.D. On: 08/28/2015 18:29       ____________________________________________   PROCEDURES  Procedure(s) performed: None  Critical Care performed: No  ____________________________________________   INITIAL IMPRESSION / ASSESSMENT AND PLAN / ED COURSE  Pertinent labs & imaging results that were available  during my care of the patient were reviewed by me and considered in my medical decision making (see chart for details).  Differential diagnosis includes but is not limited to, abdominal perforation, aortic dissection, cholecystitis, appendicitis, diverticulitis, colitis, esophagitis/gastritis, kidney stone, pyelonephritis, urinary tract infection, aortic aneurysm. All are considered in decision and treatment plan. Based upon the patient's presentation and risk factors, received a CT scan to evaluate for right-sided etiologies especially appendicitis, cholecystitis, and felt to be less likely an acute vascular disease or other  intra-abdominal etiology such as bowel obstruction.  ----------------------------------------- 7:51 PM on 08/28/2015 -----------------------------------------  Patient reports pain is improved. On repeat exam she has mild tenderness on the right side of the abdomen, but no rebound guarding or peritonitis. She appears comfortable at this time. No vomiting, or nausea. EKG was performed and is appears normal and troponin is negative without acute cardiac or pulmonary complaints.  Her CT does not demonstrate any acute intra-abdominal pathology.  ED ECG REPORT I, Sanchez Hemmer, the attending physician, personally viewed and interpreted this ECG.  Date: 08/28/2015 EKG Time: 1953 Rate: 80 Rhythm: normal sinus rhythm QRS Axis: normal Intervals: normal ST/T Wave abnormalities: normal Conduction Disturbances: none Narrative Interpretation: unremarkable  Discussed with the patient, and after further discussing that there is no clear cause of her pain and she reports that she has had symptoms like this intermittently for quite some time and she seen doctors before and been told that there is no clear cause. Today there is no evidence of acute intra-abdominal pathology are clear cause for her right-sided abdominal pain. She has no cardiac or pulmonary symptoms and her EKG and troponin are normal. I am a little unclear as to cause, and we discussed antiemetics and pain control with careful outpatient follow-up and return precautions which the patient is agreeable to. However, she reports that she is not able to tolerate any medications like tramadol, Percocet or hydrocodone. She will treat her pain with over-the-counter medication, prescription Zofran, and follow-up with her primary.  Return precautions and treatment recommendations and follow-up discussed with the patient who is agreeable with the plan.  Patient's family driving her home    ____________________________________________   FINAL  CLINICAL IMPRESSION(S) / ED DIAGNOSES  Final diagnoses:  Right sided abdominal pain      Delman Kitten, MD 08/28/15 2000

## 2015-08-28 NOTE — Discharge Instructions (Signed)
You were seen in the emergency room for abdominal pain. It is important that you follow up closely with your primary care doctor in the next couple of days.  If you're unable to see her primary care doctor you may return to the emergency room or go to the [place] walk-in clinic in 1 or 2 days for reexam.  Please return to the emergency room right away if you are to develop a fever, severe nausea, your pain becomes severe or worsens, you are unable to keep food down, begin vomiting any dark or bloody fluid, you develop any dark or bloody stools, feel dehydrated, or other new concerns or symptoms arise.   Abdominal Pain, Adult Many things can cause abdominal pain. Usually, abdominal pain is not caused by a disease and will improve without treatment. It can often be observed and treated at home. Your health care provider will do a physical exam and possibly order blood tests and X-rays to help determine the seriousness of your pain. However, in many cases, more time must pass before a clear cause of the pain can be found. Before that point, your health care provider may not know if you need more testing or further treatment. HOME CARE INSTRUCTIONS Monitor your abdominal pain for any changes. The following actions may help to alleviate any discomfort you are experiencing:  Only take over-the-counter or prescription medicines as directed by your health care provider.  Do not take laxatives unless directed to do so by your health care provider.  Try a clear liquid diet (broth, tea, or water) as directed by your health care provider. Slowly move to a bland diet as tolerated. SEEK MEDICAL CARE IF:  You have unexplained abdominal pain.  You have abdominal pain associated with nausea or diarrhea.  You have pain when you urinate or have a bowel movement.  You experience abdominal pain that wakes you in the night.  You have abdominal pain that is worsened or improved by eating food.  You have  abdominal pain that is worsened with eating fatty foods.  You have a fever. SEEK IMMEDIATE MEDICAL CARE IF:  Your pain does not go away within 2 hours.  You keep throwing up (vomiting).  Your pain is felt only in portions of the abdomen, such as the right side or the left lower portion of the abdomen.  You pass bloody or black tarry stools. MAKE SURE YOU:  Understand these instructions.  Will watch your condition.  Will get help right away if you are not doing well or get worse.   This information is not intended to replace advice given to you by your health care provider. Make sure you discuss any questions you have with your health care provider.   Document Released: 12/27/2004 Document Revised: 12/08/2014 Document Reviewed: 11/26/2012 Elsevier Interactive Patient Education [employer].

## 2015-09-01 ENCOUNTER — Other Ambulatory Visit: Payer: Self-pay | Admitting: Student

## 2015-09-01 DIAGNOSIS — M7581 Other shoulder lesions, right shoulder: Secondary | ICD-10-CM

## 2015-09-01 DIAGNOSIS — M25511 Pain in right shoulder: Secondary | ICD-10-CM

## 2015-09-01 DIAGNOSIS — M7541 Impingement syndrome of right shoulder: Secondary | ICD-10-CM

## 2015-09-07 ENCOUNTER — Encounter: Payer: Self-pay | Admitting: *Deleted

## 2015-09-13 ENCOUNTER — Encounter: Payer: Self-pay | Admitting: *Deleted

## 2015-09-14 ENCOUNTER — Other Ambulatory Visit: Payer: Self-pay | Admitting: General Surgery

## 2015-09-14 ENCOUNTER — Encounter: Payer: Self-pay | Admitting: General Surgery

## 2015-09-14 ENCOUNTER — Ambulatory Visit: Payer: Self-pay | Admitting: General Surgery

## 2015-09-14 ENCOUNTER — Ambulatory Visit (INDEPENDENT_AMBULATORY_CARE_PROVIDER_SITE_OTHER): Payer: Medicare Other | Admitting: General Surgery

## 2015-09-14 VITALS — BP 144/84 | HR 68 | Temp 97.9°F | Resp 12 | Ht 64.0 in | Wt 117.0 lb

## 2015-09-14 DIAGNOSIS — K8020 Calculus of gallbladder without cholecystitis without obstruction: Secondary | ICD-10-CM | POA: Diagnosis not present

## 2015-09-14 NOTE — Patient Instructions (Addendum)
The patient is aware to call back for any questions or concerns. Laparoscopic Cholecystectomy Laparoscopic cholecystectomy is surgery to remove the gallbladder. The gallbladder is located in the upper right part of the abdomen, behind the liver. It is a storage sac for bile, which is produced in the liver. Bile aids in the digestion and absorption of fats. Cholecystectomy is often done for inflammation of the gallbladder (cholecystitis). This condition is usually caused by a buildup of gallstones (cholelithiasis) in the gallbladder. Gallstones can block the flow of bile, and that can result in inflammation and pain. In severe cases, emergency surgery may be required. If emergency surgery is not required, you will have time to prepare for the procedure. Laparoscopic surgery is an alternative to open surgery. Laparoscopic surgery has a shorter recovery time. Your common bile duct may also need to be examined during the procedure. If stones are found in the common bile duct, they may be removed. LET [facility] CARE PROVIDER KNOW ABOUT:  Any allergies you have.  All medicines you are taking, including vitamins, herbs, eye drops, creams, and over-the-counter medicines.  Previous problems you or members of your family have had with the use of anesthetics.  Any blood disorders you have.  Previous surgeries you have had.  Any medical conditions you have. RISKS AND COMPLICATIONS Generally, this is a safe procedure. However, problems may occur, including:  Infection.  Bleeding.  Allergic reactions to medicines.  Damage to other structures or organs.  A stone remaining in the common bile duct.  A bile leak from the cyst duct that is clipped when your gallbladder is removed.  The need to convert to open surgery, which requires a larger incision in the abdomen. This may be necessary if your surgeon thinks that it is not safe to continue with a laparoscopic procedure. BEFORE THE PROCEDURE  Ask  your health care provider about:  Changing or stopping your regular medicines. This is especially important if you are taking diabetes medicines or blood thinners.  Taking medicines such as aspirin and ibuprofen. These medicines can thin your blood. Do not take these medicines before your procedure if your health care provider instructs you not to.  Follow instructions from your health care provider about eating or drinking restrictions.  Let your health care provider know if you develop a cold or an infection before surgery.  Plan to have someone take you home after the procedure.  Ask your health care provider how your surgical site will be marked or identified.  You may be given antibiotic medicine to help prevent infection. PROCEDURE  To reduce your risk of infection:  Your health care team will wash or sanitize their hands.  Your skin will be washed with soap.  An IV tube may be inserted into one of your veins.  You will be given a medicine to make you fall asleep (general anesthetic).  A breathing tube will be placed in your mouth.  The surgeon will make several small cuts (incisions) in your abdomen.  A thin, lighted tube (laparoscope) that has a tiny camera on the end will be inserted through one of the small incisions. The camera on the laparoscope will send a picture to a TV screen (monitor) in the operating room. This will give the surgeon a good view inside your abdomen.  A gas will be pumped into your abdomen. This will expand your abdomen to give the surgeon more room to perform the surgery.  Other tools that are needed for  the procedure will be inserted through the other incisions. The gallbladder will be removed through one of the incisions.  After your gallbladder has been removed, the incisions will be closed with stitches (sutures), staples, or skin glue.  Your incisions may be covered with a bandage (dressing). The procedure may vary among health care  providers and hospitals. AFTER THE PROCEDURE  Your blood pressure, heart rate, breathing rate, and blood oxygen level will be monitored often until the medicines you were given have worn off.  You will be given medicines as needed to control your pain.   This information is not intended to replace advice given to you by your health care provider. Make sure you discuss any questions you have with your health care provider.   Document Released: 03/19/2005 Document Revised: 12/08/2014 Document Reviewed: 10/29/2012 Elsevier Interactive Patient Education 2016 [place].   Patient's surgery has been scheduled for 09-16-15 at [facility].

## 2015-09-14 NOTE — H&P (Signed)
HPI Shirley Barton is a 64 y.o. female. Here today for evaluation of gall stones. She states that for a couple of years she has had abdominal pains on and off. This is usually in the right upper quadrant and epigastric area. In November 2016 the pain started getting worse. The pain is in her shoulders, back and under her right ribs. Initially, symptoms were aggravated with food, although there was no particular dietary intolerance. She states the pain has been constant since May. She has been eating liquids, canned fruit and jello for a week. She states clear liquid comes up described as vomit. She does states she has had some diarrhea as well, somewhat explosive at times when the pain is most severe. She states the stool color has been on occasion yellow with mucous, no blood.  The patient reports that her PCP had been somewhat at a loss of the source of her pain. When she questioned the possibility of gallstones she reports being told that her laboratory studies were normal.  CT and ultrasound was 08-28-15 with a trip to the ED. Her weight has fluctuated up and down. No weight loss over 5 pounds over the last month. She is here today with her daughter, Aretha Parrot, who was present for the interview and exam.  HPI  Past Medical History  Diagnosis Date  . Back pain   . Depression   . COPD (chronic obstructive pulmonary disease) ([place])   . GERD (gastroesophageal reflux disease)     Past Surgical History  Procedure Laterality Date  . Basal cell carcinoma excision  2013  . Tonsillectomy    . Colonoscopy  27"s    [place]    Family History  Problem Relation Age of Onset  . Colon polyps Sister   . Cerebral palsy Daughter     Social History Social History  Substance Use Topics  . Smoking status: Former Smoker    Quit date: 03/02/2012  . Smokeless tobacco: Never Used  . Alcohol Use: No    Allergies    Allergen Reactions  . Tramadol   . Cortisone Rash    In 2013  . Cortizone-10 [Hydrocortisone] Rash  . Penicillins Rash    Current Outpatient Prescriptions  Medication Sig Dispense Refill  . Fluticasone-Salmeterol (ADVAIR) 100-50 MCG/DOSE AEPB Inhale 1 puff into the lungs 2 (two) times daily.    . ondansetron (ZOFRAN ODT) 4 MG disintegrating tablet Take 1 tablet (4 mg total) by mouth every 6 (six) hours as needed for nausea or vomiting. 20 tablet 0  . pantoprazole (PROTONIX) 40 MG tablet Take by mouth.    . QUEtiapine (SEROQUEL) 100 MG tablet Take 50 mg by mouth at bedtime.     No current facility-administered medications for this visit.    Review of Systems Review of Systems  Constitutional: Negative.  Respiratory: Negative.  Cardiovascular: Negative.  Gastrointestinal: Positive for nausea, vomiting, abdominal pain and diarrhea.    Blood pressure 144/84, pulse 68, temperature 97.9 F (36.6 C), temperature source Oral, resp. rate 12, height 5\' 4"  (1.626 m), weight 117 lb (53.071 kg), last menstrual period 01/03/1995.  Physical Exam Physical Exam  Constitutional: She is oriented to person, place, and time. She appears well-developed and well-nourished.  HENT:  Mouth/Throat: Oropharynx is clear and moist.  Eyes: Conjunctivae are normal. No scleral icterus.  Neck: Neck supple.  Cardiovascular: Normal rate, regular rhythm and normal heart sounds.  Pulses:  Carotid pulses are 2+ on the right side, and 2+ on the  left side.  Femoral pulses are 2+ on the right side, and 2+ on the left side.  Dorsalis pedis pulses are 1+ on the right side, and 1+ on the left side.   Posterior tibial pulses are 1+ on the right side, and 1+ on the left side.  No lower leg edema.  No carotid bruits identified.  Pulmonary/Chest: Effort normal and breath sounds normal.  Abdominal: Soft. Normal appearance and bowel sounds are  normal. There is tenderness in the right upper quadrant and left upper quadrant. There is no rigidity, no rebound and no guarding.    Lymphadenopathy:   She has no cervical adenopathy.   Right: No inguinal adenopathy present.   Left: No inguinal adenopathy present.  Neurological: She is alert and oriented to person, place, and time.  Skin: Skin is warm and dry.  Psychiatric: Her behavior is normal.    Data Reviewed ED laboratory, CT and ultrasound results independently reviewed. Cholelithiasis without radiologic evidence of acute cholecystitis on ultrasound. CT scan notable for diffuse calcification in the aorta.  GI notes of 09/07/2015 reviewed. History reports dysphagia for solid foods not mentioned today. Plans for upper lower endoscopy when her present subacute abdominal issues have resolved.    Assessment    Symptomatic cholelithiasis.  Family history: Polyps (sister).  History of gastroesophageal reflux.    Plan    Indications for surgery were reviewed. It's unlikely that her reflux symptoms will significantly change after surgery. The possibility that cholecystectomy may not resolve all of her abdominal plain was discussed.     Laparoscopic Cholecystectomy with Intraoperative Cholangiogram. The procedure, including it's potential risks and complications (including but not limited to infection, bleeding, injury to intra-abdominal organs or bile ducts, bile leak, poor cosmetic result, sepsis and death) were discussed with the patient in detail. Non-operative options, including their inherent risks (acute calculous cholecystitis with possible choledocholithiasis or gallstone pancreatitis, with the risk of ascending cholangitis, sepsis, and death) were discussed as well. The patient expressed and understanding of what we discussed and wishes to proceed with laparoscopic cholecystectomy. The patient further understands that if it is technically not possible, or it is  unsafe to proceed laparoscopically, that I will convert to an open cholecystectomy.  Patient's surgery has been scheduled for 09-16-15 at [facility].   PCP: Pricilla Loveless PA This information has been scribed by Karie Fetch RN, BSN,BC.   Robert Bellow 09/14/2015, 10:14 AM

## 2015-09-14 NOTE — Progress Notes (Signed)
Patient ID: Shirley Barton, female   DOB: September 20, 1950, 65 y.o.   MRN: [id number]  Chief Complaint  Patient presents with  . Other    gall stones    HPI Shirley Barton is a 65 y.o. female.  Here today for evaluation of gall stones. She states that for a couple of years she has had abdominal pains on and off. This is usually in the right upper quadrant and epigastric area. In November  2016  the pain started getting worse. The pain is in her shoulders, back and under her right ribs. Initially, symptoms were aggravated with food, although there was no particular dietary intolerance. She states the pain has been constant since May. She has been eating liquids, canned fruit and jello for a week. She states clear liquid comes up described as vomit. She does states she has had some diarrhea as well, somewhat explosive at times when the pain is most severe. She states the stool color has been on occasion yellow with mucous, no blood.  The patient reports that her PCP had been somewhat at a loss of the source of her pain. When she questioned the possibility of gallstones she reports being told that her laboratory studies were normal.  CT and ultrasound was 08-28-15 with a trip to the ED. Her weight has fluctuated up and down. No weight loss over 5 pounds over the last month. She is here today with her daughter, Shirley Barton, who was present for the interview and exam.  HPI  Past Medical History  Diagnosis Date  . Back pain   . Depression   . COPD (chronic obstructive pulmonary disease) ([place])   . GERD (gastroesophageal reflux disease)     Past Surgical History  Procedure Laterality Date  . Basal cell carcinoma excision  2013  . Tonsillectomy    . Colonoscopy  41"s        Family History  Problem Relation Age of Onset  . Colon polyps Sister   . Cerebral palsy Daughter     Social History Social History  Substance Use Topics  . Smoking status: Former Smoker    Quit date:  03/02/2012  . Smokeless tobacco: Never Used  . Alcohol Use: No    Allergies  Allergen Reactions  . Tramadol   . Cortisone Rash    In 2013  . Cortizone-10 [Hydrocortisone] Rash  . Penicillins Rash    Current Outpatient Prescriptions  Medication Sig Dispense Refill  . Fluticasone-Salmeterol (ADVAIR) 100-50 MCG/DOSE AEPB Inhale 1 puff into the lungs 2 (two) times daily.    . ondansetron (ZOFRAN ODT) 4 MG disintegrating tablet Take 1 tablet (4 mg total) by mouth every 6 (six) hours as needed for nausea or vomiting. 20 tablet 0  . pantoprazole (PROTONIX) 40 MG tablet Take by mouth.    . QUEtiapine (SEROQUEL) 100 MG tablet Take 50 mg by mouth at bedtime.     No current facility-administered medications for this visit.    Review of Systems Review of Systems  Constitutional: Negative.   Respiratory: Negative.   Cardiovascular: Negative.   Gastrointestinal: Positive for nausea, vomiting, abdominal pain and diarrhea.    Blood pressure 144/84, pulse 68, temperature 97.9 F (36.6 C), temperature source Oral, resp. rate 12, height 5\' 4"  (1.626 m), weight 117 lb (53.071 kg), last menstrual period 01/03/1995.  Physical Exam Physical Exam  Constitutional: She is oriented to person, place, and time. She appears well-developed and well-nourished.  HENT:  Mouth/Throat: Oropharynx is clear  and moist.  Eyes: Conjunctivae are normal. No scleral icterus.  Neck: Neck supple.  Cardiovascular: Normal rate, regular rhythm and normal heart sounds.   Pulses:      Carotid pulses are 2+ on the right side, and 2+ on the left side.      Femoral pulses are 2+ on the right side, and 2+ on the left side.      Dorsalis pedis pulses are 1+ on the right side, and 1+ on the left side.       Posterior tibial pulses are 1+ on the right side, and 1+ on the left side.  No lower leg edema.  No carotid bruits identified.  Pulmonary/Chest: Effort normal and breath sounds normal.  Abdominal: Soft. Normal  appearance and bowel sounds are normal. There is tenderness in the right upper quadrant and left upper quadrant. There is no rigidity, no rebound and no guarding.    Lymphadenopathy:    She has no cervical adenopathy.       Right: No inguinal adenopathy present.       Left: No inguinal adenopathy present.  Neurological: She is alert and oriented to person, place, and time.  Skin: Skin is warm and dry.  Psychiatric: Her behavior is normal.    Data Reviewed ED laboratory, CT and ultrasound results independently reviewed. Cholelithiasis without radiologic evidence of acute cholecystitis on ultrasound. CT scan notable for diffuse calcification in the aorta.  GI notes of 09/07/2015 reviewed. History reports dysphagia for solid foods not mentioned today. Plans for upper lower endoscopy when her present subacute abdominal issues have resolved.    Assessment    Symptomatic cholelithiasis.  Family history: Polyps (sister).  History of gastroesophageal reflux.    Plan    Indications for surgery were reviewed. It's unlikely that her reflux symptoms will significantly change after surgery. The possibility that cholecystectomy may not resolve all of her abdominal plain was discussed.     Laparoscopic Cholecystectomy with Intraoperative Cholangiogram. The procedure, including it's potential risks and complications (including but not limited to infection, bleeding, injury to intra-abdominal organs or bile ducts, bile leak, poor cosmetic result, sepsis and death) were discussed with the patient in detail. Non-operative options, including their inherent risks (acute calculous cholecystitis with possible choledocholithiasis or gallstone pancreatitis, with the risk of ascending cholangitis, sepsis, and death) were discussed as well. The patient expressed and understanding of what we discussed and wishes to proceed with laparoscopic cholecystectomy. The patient further understands that if it is  technically not possible, or it is unsafe to proceed laparoscopically, that I will convert to an open cholecystectomy.  Patient's surgery has been scheduled for 09-16-15 at [facility].   PCP:  Pricilla Loveless PA This information has been scribed by Karie Fetch RN, BSN,BC.   Robert Bellow 09/14/2015, 10:14 AM

## 2015-09-15 ENCOUNTER — Encounter: Payer: Self-pay | Admitting: *Deleted

## 2015-09-15 ENCOUNTER — Other Ambulatory Visit: Payer: Medicare Other

## 2015-09-15 NOTE — Patient Instructions (Signed)
  Your procedure is scheduled on: 09/16/15 Report to Day Surgery. [facility] SECOND FLOOR To find out your arrival time please call [phone] between 1PM - 3PM on  09/15/15  Remember: Instructions that are not followed completely may result in serious medical risk, up to and including death, or upon the discretion of your surgeon and anesthesiologist your surgery may need to be rescheduled.    __X__ 1. Do not eat food or drink liquids after midnight. No gum chewing or hard candies.     __X__ 2. No Alcohol for 24 hours before or after surgery.   __X__ 3. Do Not Smoke For 24 Hours Prior to Your Surgery.   ____ 4. Bring all medications with you on the day of surgery if instructed.    __X__ 5. Notify your doctor if there is any change in your medical condition     (cold, fever, infections).       Do not wear jewelry, make-up, hairpins, clips or nail polish.  Do not wear lotions, powders, or perfumes. You may wear deodorant.  Do not shave 48 hours prior to surgery. Men may shave face and neck.  Do not bring valuables to the hospital.    [facility] is not responsible for any belongings or valuables.               Contacts, dentures or bridgework may not be worn into surgery.  Leave your suitcase in the car. After surgery it may be brought to your room.  For patients admitted to the hospital, discharge time is determined by your                treatment team.   Patients discharged the day of surgery will not be allowed to drive home.   Please read over the following fact sheets that you were given:   Surgical Site Infection Prevention and Anesthesia Post-op Instructions   _X___ Take these medicines the morning of surgery with A SIP OF WATER:    1. PROTONIX  2.   3.   4.  5.  6.  ____ Fleet Enema (as directed)   ____ Use CHG Soap as directed       COULD NOT COME TO GET CHG SOAP  __X__ Use inhalers on the day of surgery  ____ Stop metformin 2 days prior to  surgery    ____ Take 1/2 of usual insulin dose the night before surgery and none on the morning of surgery.   ____ Stop Coumadin/Plavix/aspirin on   ____ Stop Anti-inflammatories on    ____ Stop supplements until after surgery.    ____ Bring C-Pap to the hospital.

## 2015-09-16 ENCOUNTER — Ambulatory Visit: Payer: Medicare Other | Admitting: Anesthesiology

## 2015-09-16 ENCOUNTER — Encounter: Payer: Self-pay | Admitting: *Deleted

## 2015-09-16 ENCOUNTER — Encounter
Admission: RE | Disposition: A | Discharge status: Home / Self Care | Payer: Self-pay | Source: Ambulatory Visit | Attending: General Surgery

## 2015-09-16 ENCOUNTER — Ambulatory Visit: Payer: Medicare Other

## 2015-09-16 ENCOUNTER — Ambulatory Visit
Admission: RE | Admit: 2015-09-16 | Discharge: 2015-09-16 | Disposition: A | Discharge status: Home / Self Care | Payer: Medicare Other | Source: Ambulatory Visit | Attending: General Surgery | Admitting: General Surgery

## 2015-09-16 DIAGNOSIS — K8010 Calculus of gallbladder with chronic cholecystitis without obstruction: Secondary | ICD-10-CM | POA: Insufficient documentation

## 2015-09-16 DIAGNOSIS — J449 Chronic obstructive pulmonary disease, unspecified: Secondary | ICD-10-CM | POA: Insufficient documentation

## 2015-09-16 DIAGNOSIS — K219 Gastro-esophageal reflux disease without esophagitis: Secondary | ICD-10-CM | POA: Insufficient documentation

## 2015-09-16 DIAGNOSIS — Z820 Family history of epilepsy and other diseases of the nervous system: Secondary | ICD-10-CM | POA: Insufficient documentation

## 2015-09-16 DIAGNOSIS — Z85828 Personal history of other malignant neoplasm of skin: Secondary | ICD-10-CM | POA: Insufficient documentation

## 2015-09-16 DIAGNOSIS — Z79899 Other long term (current) drug therapy: Secondary | ICD-10-CM | POA: Diagnosis not present

## 2015-09-16 DIAGNOSIS — Z880 Allergy status to penicillin: Secondary | ICD-10-CM | POA: Insufficient documentation

## 2015-09-16 DIAGNOSIS — Z87891 Personal history of nicotine dependence: Secondary | ICD-10-CM | POA: Diagnosis not present

## 2015-09-16 DIAGNOSIS — Z8371 Family history of colonic polyps: Secondary | ICD-10-CM | POA: Insufficient documentation

## 2015-09-16 DIAGNOSIS — F329 Major depressive disorder, single episode, unspecified: Secondary | ICD-10-CM | POA: Diagnosis not present

## 2015-09-16 DIAGNOSIS — Z888 Allergy status to other drugs, medicaments and biological substances status: Secondary | ICD-10-CM | POA: Insufficient documentation

## 2015-09-16 DIAGNOSIS — K8044 Calculus of bile duct with chronic cholecystitis without obstruction: Secondary | ICD-10-CM | POA: Diagnosis not present

## 2015-09-16 DIAGNOSIS — K8020 Calculus of gallbladder without cholecystitis without obstruction: Secondary | ICD-10-CM

## 2015-09-16 HISTORY — PX: CHOLECYSTECTOMY: SHX55

## 2015-09-16 IMAGING — CR DG CHOLANGIOGRAM OPERATIVE
1 series · 1 of 1 positions shown · non-contrast
Comparison: Ultrasound [DATE]

CLINICAL DATA: Cholelithiasis

EXAM:
INTRAOPERATIVE CHOLANGIOGRAM
TECHNIQUE: Cholangiographic image from the C-arm fluoroscopic device submitted
for interpretation post-operatively. Please see the procedural
report for the amount of contrast and the fluoroscopy time utilized.

[1]
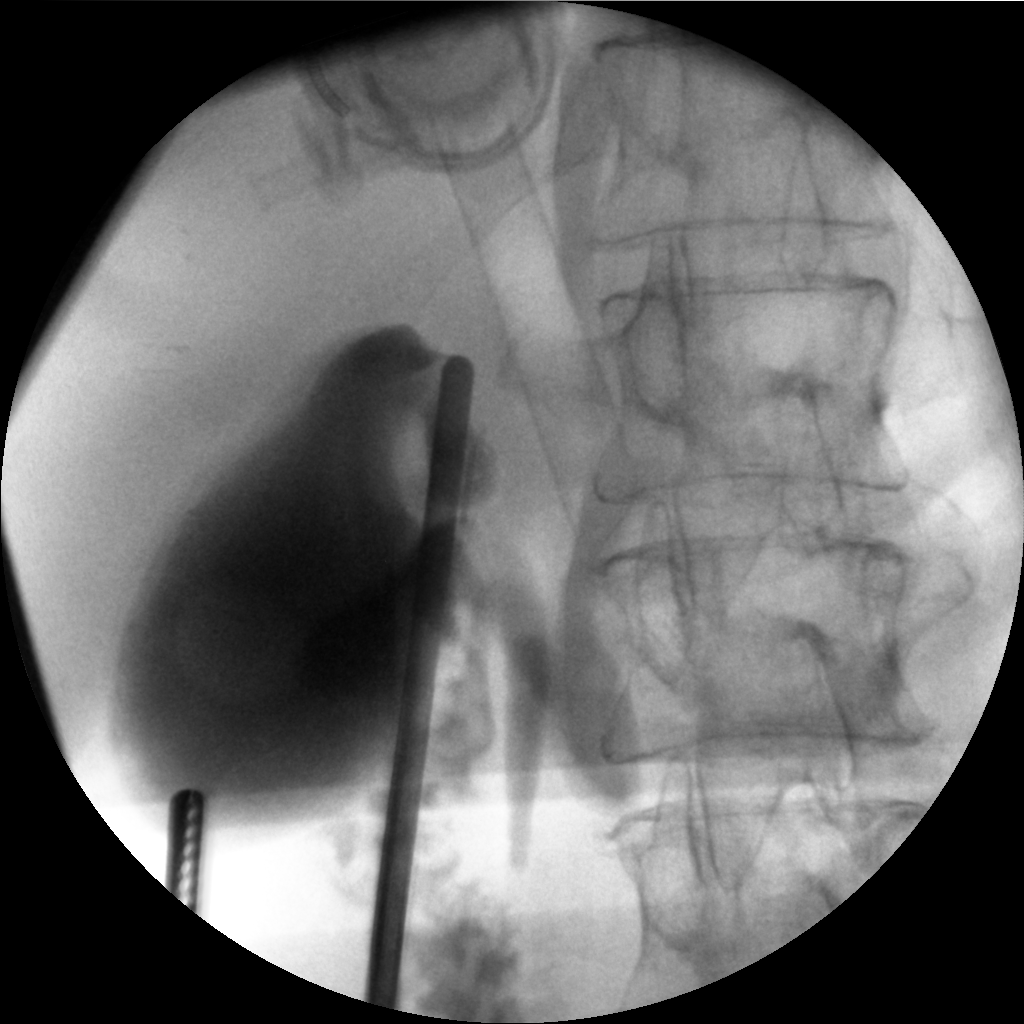

[1 of 1 positions shown; findings below may reference images not displayed]

FINDINGS: incomplete opacification of the CBD. Some contrast is noted in the
duodenum. The intrahepatic ducts are non-opacified. There is
contrast in the gallbladder.
IMPRESSION: 1. Incomplete opacification of CBD.

## 2015-09-16 SURGERY — LAPAROSCOPIC CHOLECYSTECTOMY WITH INTRAOPERATIVE CHOLANGIOGRAM
Anesthesia: General | Wound class: Clean Contaminated

## 2015-09-16 MED ORDER — ROCURONIUM BROMIDE 100 MG/10ML IV SOLN
INTRAVENOUS | Status: DC | PRN
  Administered 2015-09-16: 10 mg via INTRAVENOUS
  Administered 2015-09-16: 40 mg via INTRAVENOUS

## 2015-09-16 MED ORDER — SODIUM CHLORIDE 0.9 % IJ SOLN
INTRAMUSCULAR | Status: AC
  Filled 2015-09-16: qty 50

## 2015-09-16 MED ORDER — EPHEDRINE SULFATE 50 MG/ML IJ SOLN
INTRAMUSCULAR | Status: DC | PRN
  Administered 2015-09-16: 10 mg via INTRAVENOUS

## 2015-09-16 MED ORDER — HYDROMORPHONE HCL 1 MG/ML IJ SOLN
0.2500 mg | INTRAMUSCULAR | Status: DC | PRN
  Administered 2015-09-16 (×2): 0.5 mg via INTRAVENOUS

## 2015-09-16 MED ORDER — HYDROCODONE-ACETAMINOPHEN 5-325 MG PO TABS
1.0000 | ORAL_TABLET | ORAL | Status: DC | PRN
  Administered 2015-09-16: 1 via ORAL

## 2015-09-16 MED ORDER — SUGAMMADEX SODIUM 200 MG/2ML IV SOLN
INTRAVENOUS | Status: DC | PRN
  Administered 2015-09-16: 200 mg via INTRAVENOUS

## 2015-09-16 MED ORDER — METOCLOPRAMIDE HCL 5 MG/ML IJ SOLN
INTRAMUSCULAR | Status: AC
  Filled 2015-09-16: qty 2

## 2015-09-16 MED ORDER — HYDROCODONE-ACETAMINOPHEN 5-325 MG PO TABS: 1.0000 | ORAL_TABLET | ORAL | Status: DC | PRN

## 2015-09-16 MED ORDER — ONDANSETRON HCL 4 MG/2ML IJ SOLN
INTRAMUSCULAR | Status: AC
  Filled 2015-09-16: qty 2

## 2015-09-16 MED ORDER — DEXAMETHASONE SODIUM PHOSPHATE 10 MG/ML IJ SOLN
4.0000 mg | Freq: Once | INTRAMUSCULAR | Status: AC
  Administered 2015-09-16: 4 mg via INTRAVENOUS

## 2015-09-16 MED ORDER — LACTATED RINGERS IV SOLN
INTRAVENOUS | Status: DC
  Administered 2015-09-16 (×3): via INTRAVENOUS

## 2015-09-16 MED ORDER — FENTANYL CITRATE (PF) 100 MCG/2ML IJ SOLN
25.0000 ug | INTRAMUSCULAR | Status: DC | PRN
  Administered 2015-09-16 (×2): 50 ug via INTRAVENOUS
  Administered 2015-09-16 (×2): 25 ug via INTRAVENOUS

## 2015-09-16 MED ORDER — PROPOFOL 10 MG/ML IV BOLUS
INTRAVENOUS | Status: DC | PRN
  Administered 2015-09-16: 120 mg via INTRAVENOUS

## 2015-09-16 MED ORDER — DEXAMETHASONE SODIUM PHOSPHATE 10 MG/ML IJ SOLN
INTRAMUSCULAR | Status: DC | PRN
  Administered 2015-09-16: 10 mg via INTRAVENOUS

## 2015-09-16 MED ORDER — HYDROMORPHONE HCL 1 MG/ML IJ SOLN
INTRAMUSCULAR | Status: AC
  Administered 2015-09-16: 0.5 mg via INTRAVENOUS
  Filled 2015-09-16: qty 1

## 2015-09-16 MED ORDER — LIDOCAINE HCL (CARDIAC) 20 MG/ML IV SOLN
INTRAVENOUS | Status: DC | PRN
  Administered 2015-09-16: 100 mg via INTRAVENOUS

## 2015-09-16 MED ORDER — DEXAMETHASONE SODIUM PHOSPHATE 10 MG/ML IJ SOLN
INTRAMUSCULAR | Status: AC
  Filled 2015-09-16: qty 1

## 2015-09-16 MED ORDER — KETOROLAC TROMETHAMINE 30 MG/ML IJ SOLN
INTRAMUSCULAR | Status: DC | PRN
  Administered 2015-09-16: 30 mg via INTRAVENOUS

## 2015-09-16 MED ORDER — METOCLOPRAMIDE HCL 5 MG/ML IJ SOLN
10.0000 mg | Freq: Once | INTRAMUSCULAR | Status: AC | PRN
  Administered 2015-09-16: 10 mg via INTRAVENOUS

## 2015-09-16 MED ORDER — ACETAMINOPHEN 10 MG/ML IV SOLN
INTRAVENOUS | Status: AC
  Filled 2015-09-16: qty 100

## 2015-09-16 MED ORDER — SUCCINYLCHOLINE CHLORIDE 20 MG/ML IJ SOLN
INTRAMUSCULAR | Status: DC | PRN
  Administered 2015-09-16: 100 mg via INTRAVENOUS

## 2015-09-16 MED ORDER — ONDANSETRON HCL 4 MG/2ML IJ SOLN
INTRAMUSCULAR | Status: DC | PRN
  Administered 2015-09-16: 4 mg via INTRAVENOUS

## 2015-09-16 MED ORDER — MIDAZOLAM HCL 2 MG/2ML IJ SOLN
INTRAMUSCULAR | Status: DC | PRN
  Administered 2015-09-16: 2 mg via INTRAVENOUS

## 2015-09-16 MED ORDER — FENTANYL CITRATE (PF) 100 MCG/2ML IJ SOLN
INTRAMUSCULAR | Status: DC | PRN
  Administered 2015-09-16: 50 ug via INTRAVENOUS
  Administered 2015-09-16: 100 ug via INTRAVENOUS
  Administered 2015-09-16 (×2): 50 ug via INTRAVENOUS

## 2015-09-16 MED ORDER — PROMETHAZINE HCL 25 MG/ML IJ SOLN
12.5000 mg | INTRAMUSCULAR | Status: AC
  Administered 2015-09-16: 12.5 mg via INTRAVENOUS
  Filled 2015-09-16: qty 1

## 2015-09-16 MED ORDER — FENTANYL CITRATE (PF) 100 MCG/2ML IJ SOLN
INTRAMUSCULAR | Status: AC
  Administered 2015-09-16: 50 ug via INTRAVENOUS
  Filled 2015-09-16: qty 2

## 2015-09-16 MED ORDER — ONDANSETRON HCL 4 MG/2ML IJ SOLN
4.0000 mg | Freq: Once | INTRAMUSCULAR | Status: AC
  Administered 2015-09-16: 4 mg via INTRAVENOUS

## 2015-09-16 MED ORDER — ONDANSETRON HCL 4 MG/2ML IJ SOLN
INTRAMUSCULAR | Status: AC
  Administered 2015-09-16: 4 mg via INTRAVENOUS
  Filled 2015-09-16: qty 2

## 2015-09-16 MED ORDER — PHENYLEPHRINE HCL 10 MG/ML IJ SOLN
INTRAMUSCULAR | Status: DC | PRN
  Administered 2015-09-16: 100 ug via INTRAVENOUS

## 2015-09-16 MED ORDER — MIDAZOLAM HCL 5 MG/5ML IJ SOLN
1.0000 mg | Freq: Once | INTRAMUSCULAR | Status: AC
  Administered 2015-09-16: 1 mg via INTRAVENOUS

## 2015-09-16 MED ORDER — ACETAMINOPHEN 10 MG/ML IV SOLN
INTRAVENOUS | Status: DC | PRN
  Administered 2015-09-16: 1000 mg via INTRAVENOUS

## 2015-09-16 MED ORDER — SODIUM CHLORIDE 0.9 % IV SOLN
INTRAVENOUS | Status: DC | PRN
  Administered 2015-09-16: 30 mL

## 2015-09-16 MED ORDER — SODIUM CHLORIDE 0.9 % IJ SOLN
INTRAMUSCULAR | Status: AC
  Filled 2015-09-16: qty 10

## 2015-09-16 MED ORDER — HYDROCODONE-ACETAMINOPHEN 5-325 MG PO TABS
ORAL_TABLET | ORAL | Status: AC
  Filled 2015-09-16: qty 1

## 2015-09-16 MED ORDER — MIDAZOLAM HCL 5 MG/5ML IJ SOLN
INTRAMUSCULAR | Status: AC
  Filled 2015-09-16: qty 5

## 2015-09-16 SURGICAL SUPPLY — 42 items
APPLIER CLIP ROT 10 11.4 M/L (STAPLE) ×2
APR CLP MED LRG 11.4X10 (STAPLE) ×1
BLADE SURG 11 STRL SS SAFETY (MISCELLANEOUS) ×2 IMPLANT
CANISTER SUCT 1200ML W/VALVE (MISCELLANEOUS) ×2 IMPLANT
CANNULA DILATOR 10 W/SLV (CANNULA) ×2 IMPLANT
CANNULA DILATOR 5 W/SLV (CANNULA) ×4 IMPLANT
CATH CHOLANG 76X19 KUMAR (CATHETERS) ×2 IMPLANT
CHLORAPREP W/TINT 26ML (MISCELLANEOUS) ×2 IMPLANT
CLIP APPLIE ROT 10 11.4 M/L (STAPLE) ×1 IMPLANT
CONRAY 60ML FOR OR (MISCELLANEOUS) ×2 IMPLANT
DISSECTOR KITTNER STICK (MISCELLANEOUS) ×1 IMPLANT
DISSECTORS/KITTNER STICK (MISCELLANEOUS)
DRAPE SHEET LG 3/4 BI-LAMINATE (DRAPES) ×2 IMPLANT
DRESSING TELFA 4X3 1S ST N-ADH (GAUZE/BANDAGES/DRESSINGS) ×2 IMPLANT
DRSG TEGADERM 2-3/8X2-3/4 SM (GAUZE/BANDAGES/DRESSINGS) ×8 IMPLANT
DRSG TEGADERM 4X4.75 (GAUZE/BANDAGES/DRESSINGS) ×1 IMPLANT
DRSG TELFA 3X8 NADH (GAUZE/BANDAGES/DRESSINGS) ×2 IMPLANT
ELECT REM PT RETURN 9FT ADLT (ELECTROSURGICAL) ×2
ELECTRODE REM PT RTRN 9FT ADLT (ELECTROSURGICAL) ×1 IMPLANT
ENDOPOUCH RETRIEVER 10 (MISCELLANEOUS) ×1 IMPLANT
GLOVE BIO SURGEON STRL SZ7.5 (GLOVE) ×5 IMPLANT
GLOVE INDICATOR 8.0 STRL GRN (GLOVE) ×4 IMPLANT
GOWN STRL REUS W/ TWL LRG LVL3 (GOWN DISPOSABLE) ×3 IMPLANT
GOWN STRL REUS W/TWL LRG LVL3 (GOWN DISPOSABLE) ×6
IRRIGATION STRYKERFLOW (MISCELLANEOUS) ×1 IMPLANT
IRRIGATOR STRYKERFLOW (MISCELLANEOUS) ×2
IV LACTATED RINGERS 1000ML (IV SOLUTION) ×2 IMPLANT
KIT RM TURNOVER STRD PROC AR (KITS) ×2 IMPLANT
LABEL OR SOLS (LABEL) ×1 IMPLANT
NDL INSUFF ACCESS 14 VERSASTEP (NEEDLE) ×2 IMPLANT
NS IRRIG 500ML POUR BTL (IV SOLUTION) ×2 IMPLANT
PACK LAP CHOLECYSTECTOMY (MISCELLANEOUS) ×2 IMPLANT
PAD DRESSING TELFA 3X8 NADH (GAUZE/BANDAGES/DRESSINGS) IMPLANT
SCISSORS METZENBAUM CVD 33 (INSTRUMENTS) ×2 IMPLANT
SEAL FOR SCOPE WARMER C3101 (MISCELLANEOUS) ×1 IMPLANT
STRIP CLOSURE SKIN 1/2X4 (GAUZE/BANDAGES/DRESSINGS) ×2 IMPLANT
SUT VIC AB 0 CT2 27 (SUTURE) ×2 IMPLANT
SUT VIC AB 4-0 FS2 27 (SUTURE) ×2 IMPLANT
SWABSTK COMLB BENZOIN TINCTURE (MISCELLANEOUS) ×2 IMPLANT
TROCAR XCEL NON-BLD 11X100MML (ENDOMECHANICALS) ×2 IMPLANT
TUBING INSUFFLATOR HI FLOW (MISCELLANEOUS) ×2 IMPLANT
WATER STERILE IRR 1000ML POUR (IV SOLUTION) ×1 IMPLANT

## 2015-09-16 NOTE — Transfer of Care (Signed)
Immediate Anesthesia Transfer of Care Note  Patient: Shirley Barton  Procedure(s) Performed: Procedure(s): LAPAROSCOPIC CHOLECYSTECTOMY WITH INTRAOPERATIVE CHOLANGIOGRAM (N/A)  Patient Location: PACU  Anesthesia Type:General  Level of Consciousness: sedated  Airway & Oxygen Therapy: Patient Spontanous Breathing and Patient connected to face mask oxygen  Post-op Assessment: Report given to RN and Post -op Vital signs reviewed and stable  Post vital signs: Reviewed and stable  Last Vitals:  Filed Vitals:   09/16/15 0748  BP: 130/93  Pulse: 92  Temp: 36.8 C  Resp: 16    Last Pain:  Filed Vitals:   09/16/15 0753  PainSc: 8          Complications: No apparent anesthesia complications

## 2015-09-16 NOTE — Op Note (Signed)
Preoperative diagnosis: Chronic cholecystitis and cholelithiasis.  Postoperative diagnosis: Same.  Operative procedure: Laparoscopic cholecystectomy with intraoperative cholangiograms, lysis of adhesions.  Operating surgeon: Ollen Bowl, M.D.  Anesthesia: Gen. endotracheal.  Estimated blood loss: 5 mL.  Clinical note: This 65 year old woman has had episodic abdominal pain and ultrasound showed evidence of cholelithiasis. She had somewhat atypical symptoms were no other pathology identified she was offered cholecystectomy in an attempt to relieve her abdominal discomfort.  Operative note: With the patient under adequate general endotracheal anesthesia the abdomen was prepped with ChloraPrep and draped. An Trendelenburg position a varies needle was placed through trans-umbilical incision. After assuring intra-abdominal location without pain drop test the abdomen was insufflated with CO2 a 10 mmHg pressure. A 10 mm Depth port was expanded and inspection showed that there were adhesions of the omentum to the anterior abdominal wall. A free space was identified and an 11 mm XL port was placed in the epigastrium. Looking from the epigastric port there were just his band of adhesions that could be taken down once the lateral ports were placed. No evidence of intestinal involvement or injury. No other intra-abdominal pathology. 2-5 mm Step ports were placed under direct vision. The patient was placed in the reverse Trendelenburg position and rolled to the left. The gallbladder was rarely tensely distended but did not require decompression prior to dissection. The neck of the gallbladder was cleared and the cystic duct and cystic duct junction with the common bile duct was identified. Cystic artery was appreciated. Cholangiogram showed free flow in the distal common bile duct and the duodenum. I do repositioning the patient and additional fluid of the proximal portion was not visualized. With clear  visualization of the anatomy the cystic duct was doubly clipped and divided as was the cystic artery. The gallbladder was removed from the liver bed making some hook cautery dissection. It was delivered to the umbilical port site. The gallbladder was opened and a large stone crushed to allow extraction. Inspection showed good hemostasis. The right upper quadrant was irrigated with lactated Ringer solution. The abdomen was then desufflated and ports removed under direct vision. The fascia at the umbilicus was closed with a 0 Vicryl figure-of-eight suture. Skin incisions were closed with 4-0 Vicryl subcuticular suture. Benzoin, Steri-Strips, Telfa and Tegaderm dressings were applied.  The patient tolerated the procedure well and was taken to recovery in stable condition.

## 2015-09-16 NOTE — Progress Notes (Signed)
C/o nausea; Dr. Benjamine Mola notified and ordered decadron. Given as per order

## 2015-09-16 NOTE — Discharge Instructions (Signed)

## 2015-09-16 NOTE — Progress Notes (Signed)
Continues to c/o nausea. Phenergan ordered and given.

## 2015-09-16 NOTE — Anesthesia Preprocedure Evaluation (Signed)
Anesthesia Evaluation  Patient identified by MRN, date of birth, ID band Patient awake    Reviewed: Allergy & Precautions, NPO status , Patient's Chart, lab work & pertinent test results  Airway Mallampati: I  TM Distance: >3 FB Neck ROM: Full    Dental  (+) Upper Dentures, Lower Dentures   Pulmonary COPD,  COPD inhaler, former smoker,    Pulmonary exam normal        Cardiovascular Exercise Tolerance: Good negative cardio ROS   Rhythm:Regular Rate:Normal     Neuro/Psych Depression    GI/Hepatic GERD  Medicated and Controlled,  Endo/Other    Renal/GU      Musculoskeletal   Abdominal Normal abdominal exam  (+)   Peds  Hematology   Anesthesia Other Findings   Reproductive/Obstetrics                             Anesthesia Physical Anesthesia Plan  ASA: II  Anesthesia Plan: General   Post-op Pain Management:    Induction: Intravenous  Airway Management Planned: Oral ETT  Additional Equipment:   Intra-op Plan:   Post-operative Plan: Extubation in OR  Informed Consent: I have reviewed the patients History and Physical, chart, labs and discussed the procedure including the risks, benefits and alternatives for the proposed anesthesia with the patient or authorized representative who has indicated his/her understanding and acceptance.     Plan Discussed with: CRNA and Surgeon  Anesthesia Plan Comments:         Anesthesia Quick Evaluation

## 2015-09-16 NOTE — Anesthesia Postprocedure Evaluation (Signed)
Anesthesia Post Note  Patient: Shirley Barton  Procedure(s) Performed: Procedure(s) (LRB): LAPAROSCOPIC CHOLECYSTECTOMY WITH INTRAOPERATIVE CHOLANGIOGRAM (N/A)  Patient location during evaluation: PACU Level of consciousness: awake Pain management: pain level controlled Vital Signs Assessment: post-procedure vital signs reviewed and stable Respiratory status: spontaneous breathing Cardiovascular status: blood pressure returned to baseline Postop Assessment: no headache Anesthetic complications: no    Last Vitals:  Filed Vitals:   09/16/15 1010 09/16/15 1020  BP: 138/91 156/93  Pulse: 68 92  Temp:    Resp: 12 15    Last Pain:  Filed Vitals:   09/16/15 1023  PainSc: 8                  Dennise Bamber M

## 2015-09-16 NOTE — H&P (Signed)
No change in clinical exam or history. Less anxious today. For cholecystectomy.

## 2015-09-16 NOTE — Anesthesia Procedure Notes (Signed)
Procedure Name: Intubation Date/Time: 09/16/2015 8:43 AM Performed by: Nelda Marseille Pre-anesthesia Checklist: Patient identified, Patient being monitored, Timeout performed, Emergency Drugs available and Suction available Patient Re-evaluated:Patient Re-evaluated prior to inductionOxygen Delivery Method: Circle system utilized Preoxygenation: Pre-oxygenation with 100% oxygen Intubation Type: IV induction Ventilation: Mask ventilation without difficulty Laryngoscope Size: Mac and 3 Grade View: Grade I Tube type: Oral Tube size: 7.0 mm Number of attempts: 1 Airway Equipment and Method: Stylet Placement Confirmation: ETT inserted through vocal cords under direct vision,  positive ETCO2 and breath sounds checked- equal and bilateral Secured at: 21 cm Tube secured with: Tape Dental Injury: Teeth and Oropharynx as per pre-operative assessment

## 2015-09-19 ENCOUNTER — Encounter: Payer: Self-pay | Admitting: General Surgery

## 2015-09-19 LAB — SURGICAL PATHOLOGY

## 2015-09-26 ENCOUNTER — Ambulatory Visit (INDEPENDENT_AMBULATORY_CARE_PROVIDER_SITE_OTHER): Payer: Medicare Other | Admitting: General Surgery

## 2015-09-26 VITALS — BP 140/86 | HR 89 | Temp 98.0°F | Resp 13 | Ht 64.0 in | Wt 117.0 lb

## 2015-09-26 DIAGNOSIS — K8020 Calculus of gallbladder without cholecystitis without obstruction: Secondary | ICD-10-CM

## 2015-09-26 NOTE — Patient Instructions (Signed)
May use a heating pad for comfort. Try to eat all meals. Follow up in 2 months.

## 2015-09-26 NOTE — Progress Notes (Signed)
Patient ID: Shirley Barton, female   DOB: 1950/06/15, 65 y.o.   MRN: [id number]  Chief Complaint  Patient presents with  . Routine Post Op    HPI Shirley Barton is a 65 y.o. female here for a post op visit from gallbladder removal surgery done on 09/16/15. She reports that she has had minimal stomach and right sided pain since surgery, markedly improved over the preoperative condition.. She reports some chills for the past 2 days, but no fevers. She reports she does not have much of an appetite, but no problems with any foods that she does eat. Nausea and vomiting which had been prominent prior to the procedure has resolved completely. I personally reviewed the patient's history. HPI  Past Medical History  Diagnosis Date  . Back pain   . Depression   . COPD (chronic obstructive pulmonary disease) ([place])   . GERD (gastroesophageal reflux disease)     Past Surgical History  Procedure Laterality Date  . Basal cell carcinoma excision  2013  . Tonsillectomy    . Colonoscopy  1990"s    [place]  . Carpal tunnel release Left 90s  . Cholecystectomy N/A 09/16/2015    Procedure: LAPAROSCOPIC CHOLECYSTECTOMY WITH INTRAOPERATIVE CHOLANGIOGRAM;  Surgeon: Robert Bellow, MD;  Location: [facility] ORS;  Service: General;  Laterality: N/A;    Family History  Problem Relation Age of Onset  . Colon polyps Sister   . Cerebral palsy Daughter     Social History Social History  Substance Use Topics  . Smoking status: Former Smoker    Quit date: 03/02/2012  . Smokeless tobacco: Never Used  . Alcohol Use: No    Allergies  Allergen Reactions  . Tramadol   . Cortisone Rash    In 2013  . Cortizone-10 [Hydrocortisone] Rash  . Penicillins Rash    Current Outpatient Prescriptions  Medication Sig Dispense Refill  . Fluticasone-Salmeterol (ADVAIR) 100-50 MCG/DOSE AEPB Inhale 1 puff into the lungs 2 (two) times daily.    Marland Kitchen HYDROcodone-acetaminophen (NORCO) 5-325 MG tablet Take 1 tablet by mouth every  4 (four) hours as needed for moderate pain or severe pain. 30 tablet 0  . ondansetron (ZOFRAN ODT) 4 MG disintegrating tablet Take 1 tablet (4 mg total) by mouth every 6 (six) hours as needed for nausea or vomiting. 20 tablet 0  . pantoprazole (PROTONIX) 40 MG tablet Take by mouth.    . QUEtiapine (SEROQUEL) 100 MG tablet Take 50 mg by mouth at bedtime.     No current facility-administered medications for this visit.    Review of Systems Review of Systems  Constitutional: Negative.   Respiratory: Negative.   Cardiovascular: Negative.   Gastrointestinal: Negative.     Blood pressure 140/86, pulse 89, temperature 98 F (36.7 C), resp. rate 13, height 5\' 4"  (1.626 m), weight 117 lb (53.071 kg), last menstrual period 01/03/1995.  Physical Exam Physical Exam  Constitutional: She is oriented to person, place, and time. She appears well-developed and well-nourished.  Eyes: Conjunctivae are normal. No scleral icterus.  Neck: Neck supple.  Cardiovascular: Normal rate, regular rhythm and normal heart sounds.   Pulmonary/Chest: Effort normal and breath sounds normal.  Abdominal: Soft. Normal appearance and bowel sounds are normal.  Lymphadenopathy:    She has no cervical adenopathy.  Neurological: She is alert and oriented to person, place, and time.  Skin: Skin is warm and dry.  There is a 4 x 8 mm scab on the left lower chest.  Small amount  of bruising at the lateral port site.   Psychiatric: She has a normal mood and affect.    Data Reviewed A. GALLBLADDER; LAPAROSCOPIC CHOLECYSTECTOMY:  - CHRONIC CHOLECYSTITIS WITH CHOLELITHIASIS.  - NEGATIVE FOR MALIGNANCY.   Assessment    Doing well status post cholecystectomy.    Plan    I anticipate the patient will continue to show study improvement. The patient will return in 2 months for final exam.    PCP: Franchot Gallo This has been scribed by Lesly Rubenstein LPN    Robert Bellow 09/27/2015, 7:02 PM

## 2015-10-05 ENCOUNTER — Telehealth: Payer: Self-pay | Admitting: *Deleted

## 2015-10-05 NOTE — Telephone Encounter (Signed)
Patient had gallbladder surgery on 09/16/15 and is feeling sick and can not eat. She wants to know is there anything that she can take and should she be feeling like this after almost a month?

## 2015-10-05 NOTE — Telephone Encounter (Signed)
I talked with the patient and she states that since thursday late evening she has been having sweats with chills. She has not taken her temperature. Her vision is "fuzzy", she states it is like this when she has an infection. She vomited this morning but none since. She is having right sided abdominal pain. She has had 4 BM's today runny to soft and brown in color. No appetite but drinking Gatorade. She is aware that Dr Bary Castilla has gone for the today and I will get in touch with her tomorrow.

## 2015-10-06 ENCOUNTER — Encounter: Payer: Self-pay | Admitting: General Surgery

## 2015-10-06 ENCOUNTER — Ambulatory Visit (INDEPENDENT_AMBULATORY_CARE_PROVIDER_SITE_OTHER): Payer: Medicare Other | Admitting: General Surgery

## 2015-10-06 VITALS — BP 144/92 | HR 92 | Temp 98.8°F | Resp 12 | Ht 64.5 in | Wt 117.0 lb

## 2015-10-06 DIAGNOSIS — R109 Unspecified abdominal pain: Secondary | ICD-10-CM | POA: Insufficient documentation

## 2015-10-06 DIAGNOSIS — G8929 Other chronic pain: Secondary | ICD-10-CM | POA: Insufficient documentation

## 2015-10-06 DIAGNOSIS — R1084 Generalized abdominal pain: Secondary | ICD-10-CM

## 2015-10-06 MED ORDER — METOCLOPRAMIDE HCL 5 MG PO TABS: 5.0000 mg | ORAL_TABLET | Freq: Two times a day (BID) | ORAL | Status: DC

## 2015-10-06 MED ORDER — METOCLOPRAMIDE HCL 10 MG PO TABS: 10.0000 mg | ORAL_TABLET | Freq: Four times a day (QID) | ORAL | Status: DC

## 2015-10-06 MED ORDER — METOCLOPRAMIDE HCL 10 MG PO TABS: 5.0000 mg | ORAL_TABLET | Freq: Two times a day (BID) | ORAL | Status: DC

## 2015-10-06 NOTE — Patient Instructions (Signed)
The patient is aware to call back for any questions or concerns. Additional nutritional supplement to diet

## 2015-10-06 NOTE — Progress Notes (Signed)
Patient ID: Shirley Barton, female   DOB: 12-10-1950, 65 y.o.   MRN: [id number]  Chief Complaint  Patient presents with  . Follow-up    HPI Shirley Barton is a 65 y.o. female.  the patient and she states that since thursday late evening she has been having sweats with chills. She states she is The sweats begin at the waist and then rise up to her scalp which will be drenched at the end. This has occurred about 3 times. He reports since her teenage years when she ran a high fever for the better part of a week her temperature does not rise when she has infections and that her blood work always remains normal. She has not taken her temperature. Her vision is "fuzzy", she states it is like this when she has an infection. She vomited (regurgitated) this morning but none since. She is having right sided abdominal pain. She has had 4 BM's today runny to soft and brown in color. No appetite but drinking Gatorade.  Today she states she has had 3 BM brown/yellow and loose. She states her abdomen is still painful which is making her feel sick on her stomach. She spit up clear liquid this morning.This was a frequent occurrence for several years prior to her recent cholecystectomy. She reports that she just leans over the commode and liquid comes out as she reported like a " spigot". She states her abdomen is "puffy". The pronounced nausea and vomiting that she was experiencing prior to cholecystectomy has resolved.  She is here today with her sister, [name].  I personally reviewed the patient's history.  HPI  Past Medical History  Diagnosis Date  . Back pain   . Depression   . COPD (chronic obstructive pulmonary disease) ([place])   . GERD (gastroesophageal reflux disease)     Past Surgical History  Procedure Laterality Date  . Basal cell carcinoma excision  2013  . Tonsillectomy    . Colonoscopy  1990"s    [place]  . Carpal tunnel release Left 90s  . Cholecystectomy N/A 09/16/2015   Procedure: LAPAROSCOPIC CHOLECYSTECTOMY WITH INTRAOPERATIVE CHOLANGIOGRAM;  Surgeon: Robert Bellow, MD;  Location: [facility] ORS;  Service: General;  Laterality: N/A;    Family History  Problem Relation Age of Onset  . Colon polyps Sister   . Cerebral palsy Daughter     Social History Social History  Substance Use Topics  . Smoking status: Former Smoker    Quit date: 03/02/2012  . Smokeless tobacco: Never Used  . Alcohol Use: No    Allergies  Allergen Reactions  . Tramadol   . Cortisone Rash    In 2013  . Cortizone-10 [Hydrocortisone] Rash  . Penicillins Rash    Current Outpatient Prescriptions  Medication Sig Dispense Refill  . acetaminophen (TYLENOL) 325 MG tablet Take 650 mg by mouth every 6 (six) hours as needed.    . Fluticasone-Salmeterol (ADVAIR) 100-50 MCG/DOSE AEPB Inhale 1 puff into the lungs 2 (two) times daily.    . ondansetron (ZOFRAN ODT) 4 MG disintegrating tablet Take 1 tablet (4 mg total) by mouth every 6 (six) hours as needed for nausea or vomiting. 20 tablet 0  . pantoprazole (PROTONIX) 40 MG tablet Take by mouth.    . QUEtiapine (SEROQUEL) 100 MG tablet Take 50 mg by mouth at bedtime.    . metoCLOPramide (REGLAN) 5 MG tablet Take 1 tablet (5 mg total) by mouth 2 (two) times daily. 30 tablet 0  No current facility-administered medications for this visit.    Review of Systems Review of Systems  Constitutional: Positive for chills.  Respiratory: Negative.   Gastrointestinal: Positive for nausea, vomiting and abdominal pain. Negative for constipation.  Neurological: Positive for dizziness and light-headedness.    Blood pressure 144/92, pulse 92, temperature 98.8 F (37.1 C), temperature source Oral, resp. rate 12, height 5' 4.5" (1.638 m), weight 117 lb (53.071 kg), last menstrual period 01/03/1995, SpO2 95 %.  The patient's weight is unchanged from last week.  Physical Exam Physical Exam  Constitutional: She is oriented to person, place, and  time. She appears well-developed and well-nourished.  HENT:  Mouth/Throat: Oropharynx is clear and moist.  Eyes: Conjunctivae are normal. No scleral icterus.  Neck: Neck supple.  Cardiovascular: Normal rate, regular rhythm and normal heart sounds.   Pulmonary/Chest: Effort normal and breath sounds normal.  Abdominal: Soft. Normal appearance and bowel sounds are normal. She exhibits no fluid wave and no ascites. There is no hepatosplenomegaly. There is tenderness in the right upper quadrant, right lower quadrant and left lower quadrant. There is CVA tenderness.    Abdominal port sites well healed. Tender lower abdomen. Right CVA tenderness and lower rib cage.   Musculoskeletal:       Back:  Lymphadenopathy:    She has no cervical adenopathy.  Neurological: She is alert and oriented to person, place, and time.  Skin: Skin is warm and dry.  Psychiatric: Her behavior is normal.    Data Reviewed Cholangiograms did not show filling of the hepatic ducts. Chronic cholecystitis on pathology.  Assessment    The patient's report of sweating beginning at the waist and extending of the head is very peculiar. This is difficult to explain based on any infectious process. Her blood pressures at the high end of normal, but nothing to make me suspect pheochromocytoma. (CT scan did not show any adrenal abnormality in May 2017).  The presence of brown stools would speak against a bile duct injury, but not necessarily a small leak.    Plan         Recommend Met B, CBC, Hepatic function, and U/A. Additional nutritional supplement to diet (ensure/boost slashed Slim fast)  The patient will benefit from endoscopy sometime in the future, and presently has a follow-up at the GI clinic on July 10. PCP:  [place], Lattie Corns  This information has been scribed by Karie Fetch RN, BSN,BC.   Robert Bellow 10/06/2015, 7:27 PM

## 2015-10-07 LAB — CBC WITH DIFFERENTIAL/PLATELET
BASOS: 0 %
Basophils Absolute: 0 10*3/uL (ref 0.0–0.2)
EOS (ABSOLUTE): 0.1 10*3/uL (ref 0.0–0.4)
EOS: 1 %
HEMATOCRIT: 49.2 % — AB (ref 34.0–46.6)
HEMOGLOBIN: 16 g/dL — AB (ref 11.1–15.9)
IMMATURE GRANS (ABS): 0 10*3/uL (ref 0.0–0.1)
IMMATURE GRANULOCYTES: 0 %
Lymphocytes Absolute: 2.7 10*3/uL (ref 0.7–3.1)
Lymphs: 29 %
MCH: 29.6 pg (ref 26.6–33.0)
MCHC: 32.5 g/dL (ref 31.5–35.7)
MCV: 91 fL (ref 79–97)
MONOCYTES: 6 %
MONOS ABS: 0.6 10*3/uL (ref 0.1–0.9)
NEUTROS PCT: 64 %
Neutrophils Absolute: 5.9 10*3/uL (ref 1.4–7.0)
Platelets: 251 10*3/uL (ref 150–379)
RBC: 5.41 x10E6/uL — ABNORMAL HIGH (ref 3.77–5.28)
RDW: 13.8 % (ref 12.3–15.4)
WBC: 9.3 10*3/uL (ref 3.4–10.8)

## 2015-10-07 LAB — HEPATIC FUNCTION PANEL
ALK PHOS: 69 IU/L (ref 39–117)
ALT: 16 IU/L (ref 0–32)
AST: 19 IU/L (ref 0–40)
Albumin: 4.7 g/dL (ref 3.6–4.8)
BILIRUBIN TOTAL: 0.5 mg/dL (ref 0.0–1.2)
BILIRUBIN, DIRECT: 0.13 mg/dL (ref 0.00–0.40)
Total Protein: 7.5 g/dL (ref 6.0–8.5)

## 2015-10-07 LAB — URINALYSIS, ROUTINE W REFLEX MICROSCOPIC
Bilirubin, UA: NEGATIVE
GLUCOSE, UA: NEGATIVE
Ketones, UA: NEGATIVE
Nitrite, UA: NEGATIVE
PH UA: 6 (ref 5.0–7.5)
PROTEIN UA: NEGATIVE
RBC, UA: NEGATIVE
Specific Gravity, UA: 1.02 (ref 1.005–1.030)
Urobilinogen, Ur: 0.2 mg/dL (ref 0.2–1.0)

## 2015-10-07 LAB — MICROSCOPIC EXAMINATION
BACTERIA UA: NONE SEEN
CASTS: NONE SEEN /LPF

## 2015-10-07 LAB — BASIC METABOLIC PANEL
BUN/Creatinine Ratio: 9 — ABNORMAL LOW (ref 12–28)
BUN: 7 mg/dL — ABNORMAL LOW (ref 8–27)
CO2: 26 mmol/L (ref 18–29)
CREATININE: 0.77 mg/dL (ref 0.57–1.00)
Calcium: 9.9 mg/dL (ref 8.7–10.3)
Chloride: 97 mmol/L (ref 96–106)
GFR calc Af Amer: 95 mL/min/{1.73_m2} (ref >59)
GFR, EST NON AFRICAN AMERICAN: 82 mL/min/{1.73_m2} (ref >59)
Glucose: 90 mg/dL (ref 65–99)
Potassium: 4.4 mmol/L (ref 3.5–5.2)
SODIUM: 141 mmol/L (ref 134–144)

## 2015-10-10 ENCOUNTER — Other Ambulatory Visit: Payer: Self-pay | Admitting: Gastroenterology

## 2015-10-10 DIAGNOSIS — R1084 Generalized abdominal pain: Secondary | ICD-10-CM

## 2015-10-11 DIAGNOSIS — R0789 Other chest pain: Secondary | ICD-10-CM | POA: Insufficient documentation

## 2015-10-11 DIAGNOSIS — R0609 Other forms of dyspnea: Secondary | ICD-10-CM | POA: Insufficient documentation

## 2015-10-13 ENCOUNTER — Telehealth: Payer: Self-pay | Admitting: *Deleted

## 2015-10-13 NOTE — Telephone Encounter (Signed)
-----   Message from Robert Bellow, MD sent at 10/13/2015 10:04 AM EDT ----- Reviewed notes from GI and cardiology evaluation. See how abdominal pain is doing. Thanks. ----- Message -----    From: [facility] Lab Results In Interface    Sent: 10/07/2015   5:37 AM      To: Robert Bellow, MD

## 2015-10-13 NOTE — Telephone Encounter (Signed)
I called and she states the pain is maybe some better but still has occasional sharp pains. She has a CT scan scheduled for next week., July 19th. Appreciates all your care.

## 2015-10-19 ENCOUNTER — Ambulatory Visit
Admission: RE | Admit: 2015-10-19 | Discharge: 2015-10-19 | Disposition: A | Discharge status: Home / Self Care | Payer: Medicare Other | Source: Ambulatory Visit | Attending: Gastroenterology | Admitting: Gastroenterology

## 2015-10-19 DIAGNOSIS — I700 Atherosclerosis of aorta: Secondary | ICD-10-CM | POA: Diagnosis not present

## 2015-10-19 DIAGNOSIS — J439 Emphysema, unspecified: Secondary | ICD-10-CM | POA: Insufficient documentation

## 2015-10-19 DIAGNOSIS — R1084 Generalized abdominal pain: Secondary | ICD-10-CM

## 2015-10-19 HISTORY — DX: Essential (primary) hypertension: I10

## 2015-10-19 IMAGING — CT CT ABD-PELV W/ CM
1 of 3 series · 13 of 32 positions shown, 17 images · IV contrast (APPLIED)
Comparison: Intraoperative images during laparoscopic
cholecystectomy - [DATE]; CT abdomen pelvis - [DATE]; right
upper quadrant abdominal ultrasound - [DATE]

CLINICAL DATA: History of cholecystectomy, now with postprandial
abdominal pain.

EXAM:
CT ABDOMEN AND PELVIS WITH CONTRAST
TECHNIQUE: Multidetector CT imaging of the abdomen and pelvis was performed
using the standard protocol following bolus administration of
intravenous contrast.
CONTRAST:  100mL [GZ] IOPAMIDOL ([GZ]) INJECTION 61%

[Series 2: axial st · axial · 0.61mm/px · z∈[-1160,-814]mm · 13 of 79 slices shown, 17 images]
[im 5/79  soft-tissue]
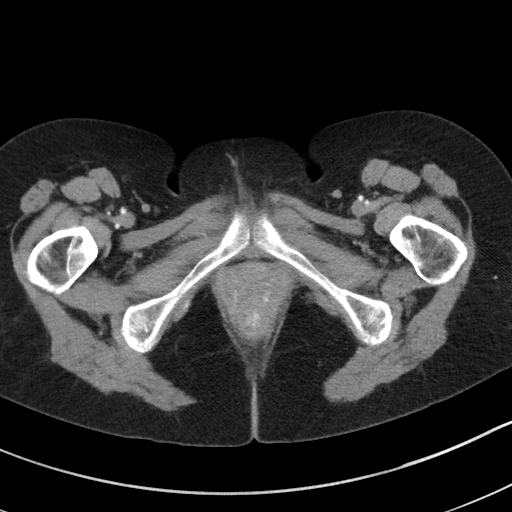
[im 5/79  bone]
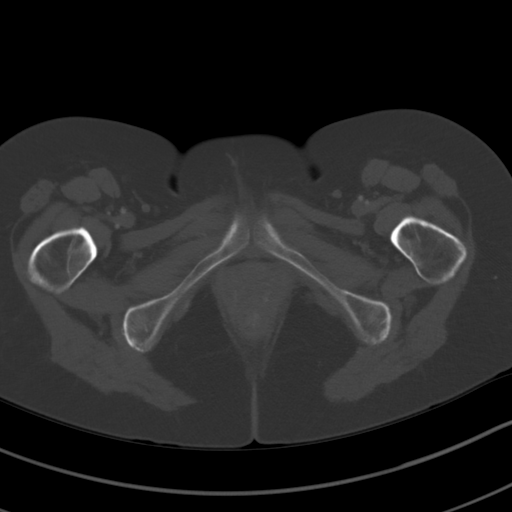
[im 13/79  soft-tissue]
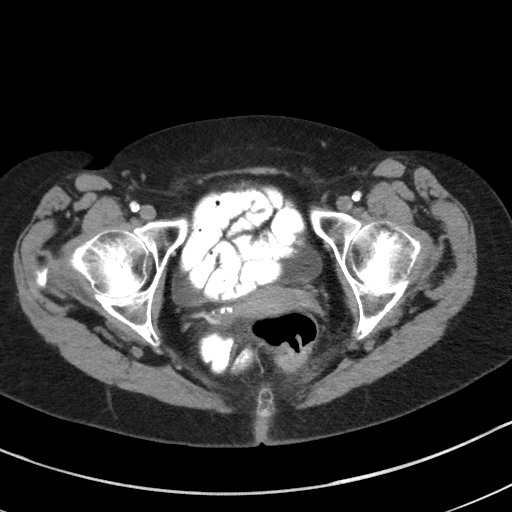
[im 21/79  soft-tissue]
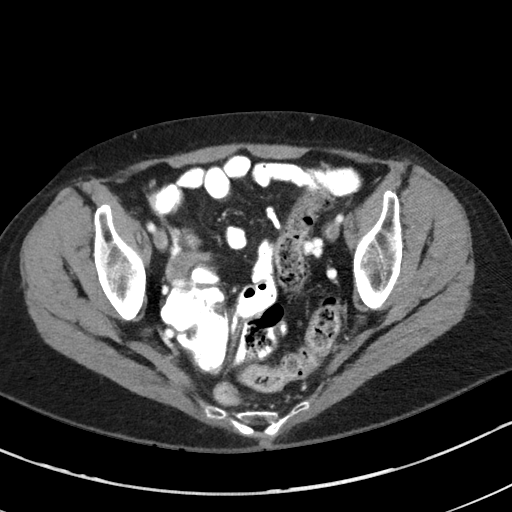
[im 25/79  soft-tissue]
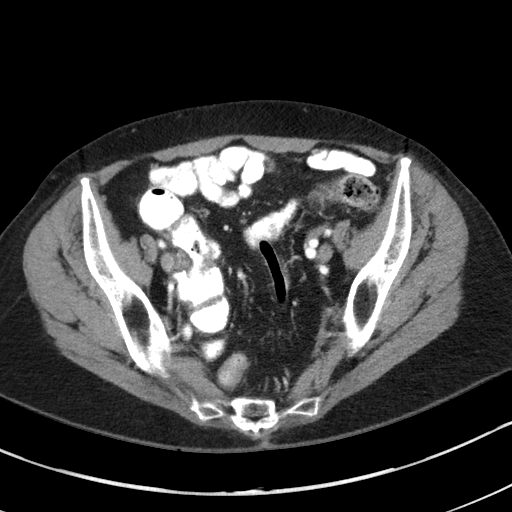
[im 33/79  soft-tissue]
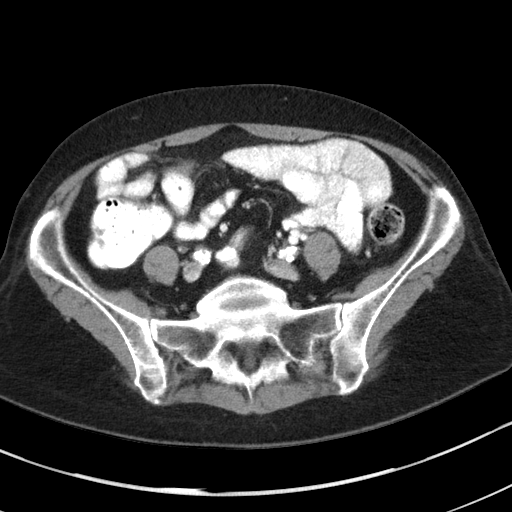
[im 42/79  soft-tissue]
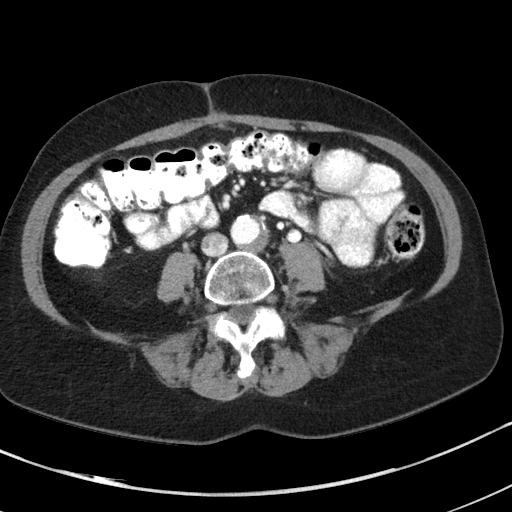
[im 46/79  soft-tissue]
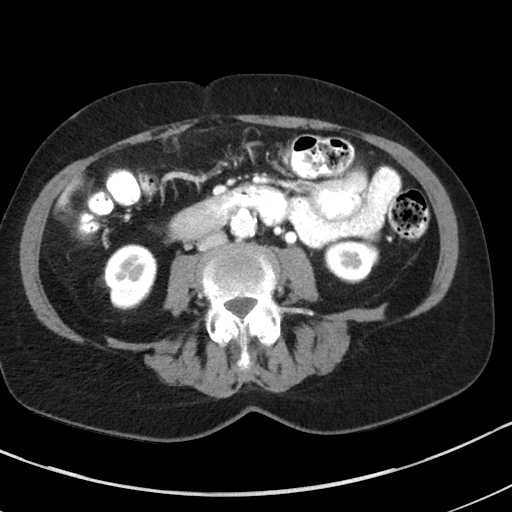
[im 54/79  soft-tissue]
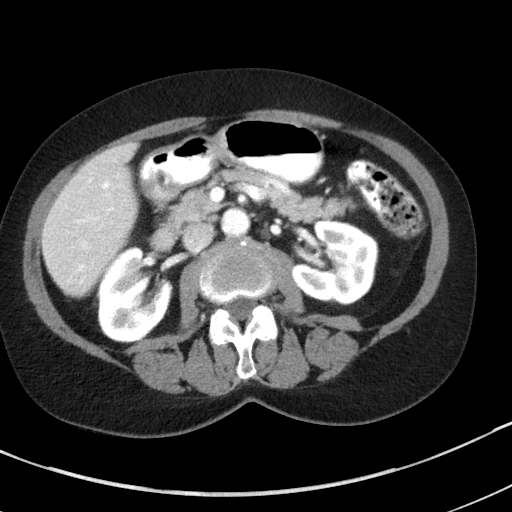
[im 58/79  soft-tissue]
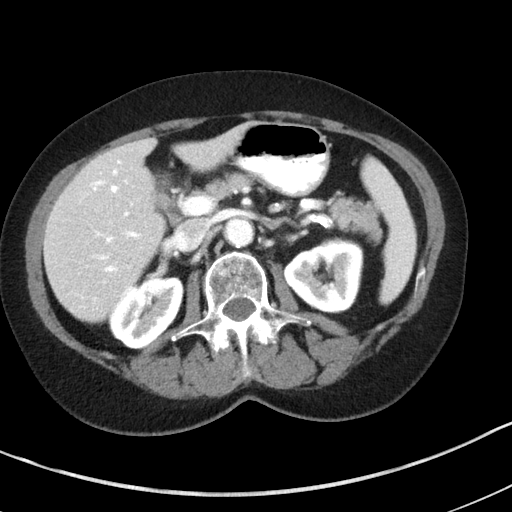
[im 58/79  bone]
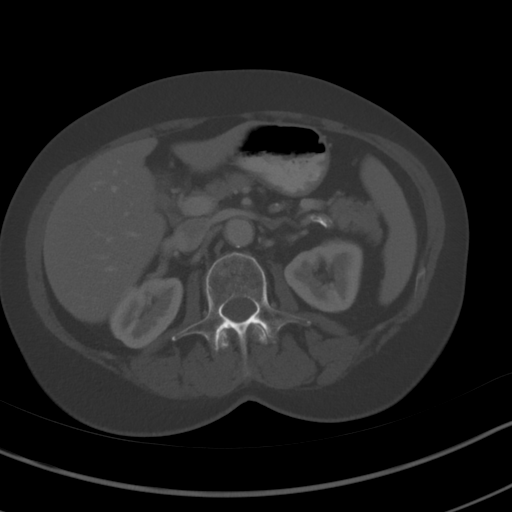
[im 62/79  lung]
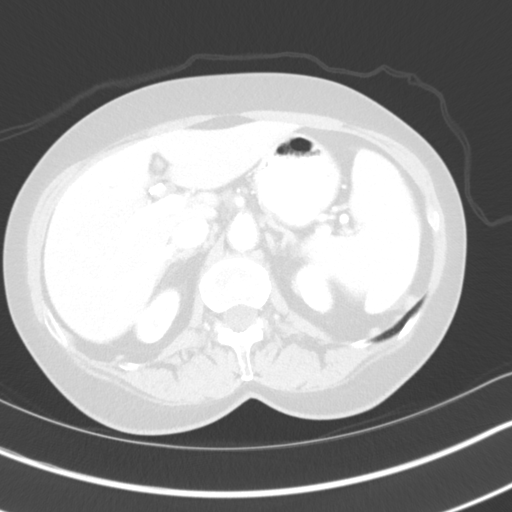
[im 66/79  soft-tissue]
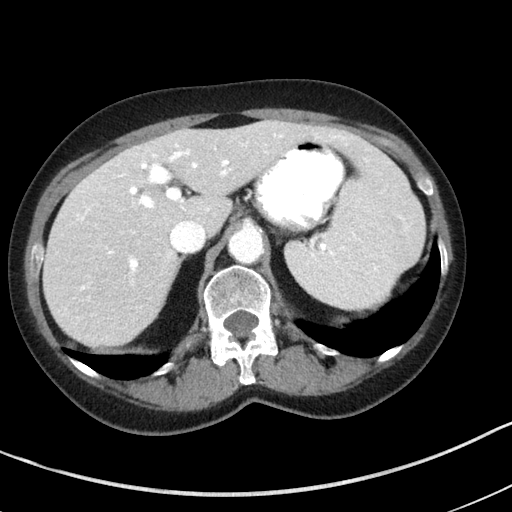
[im 66/79  lung]
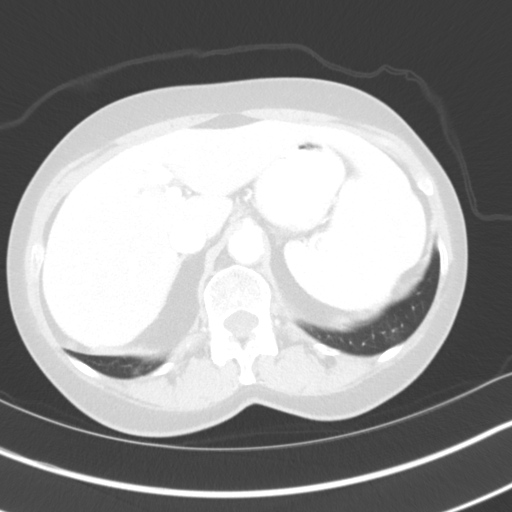
[im 70/79  lung]
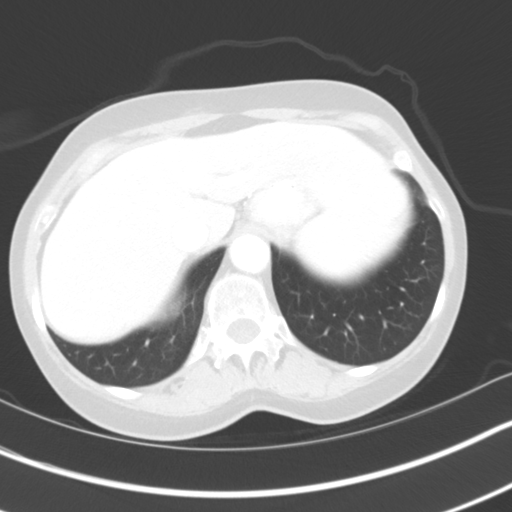
[im 74/79  soft-tissue]
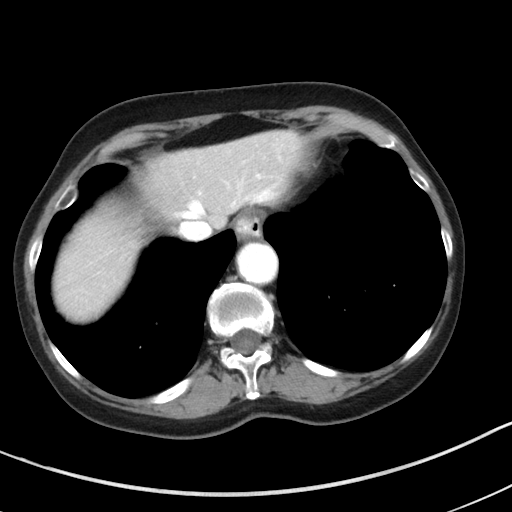
[im 74/79  lung]
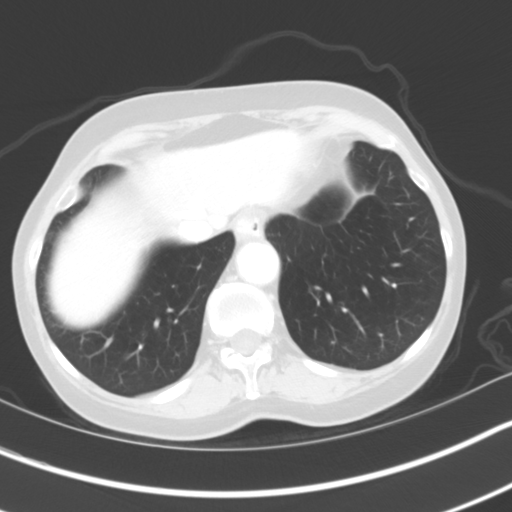

[13 of 32 positions shown; findings below may reference images not displayed]

FINDINGS: Lower chest: Limited visualization of the lower thorax demonstrates
advanced centrilobular emphysematous change. No focal airspace
opacities. No pleural effusion or pneumothorax.

Hepatobiliary: Normal hepatic contour. There is a minimal amount of
focal fatty infiltration adjacent to the fissure for ligamentum
teres. No discrete hepatic lesions. Post cholecystectomy. No intra
or extrahepatic biliary duct dilatation. No evidence of radiopaque
choledocholithiasis. No ascites.

Pancreas: Normal appearance of the pancreas.

Spleen: Normal appearance of the spleen.

Adrenals/Urinary Tract: There is symmetric enhancement and excretion
of the bilateral kidneys. No definite renal stones this postcontrast
examination. No discrete renal lesions. No urine obstruction or
perinephric stranding.

Normal appearance of the bilateral adrenal glands.

Normal appearance of the urinary bladder given degree distention.

Stomach/Bowel: Suspected small hiatal hernia. Ingested enteric
contrast extends to the level of the proximal descending colon.
Moderate large stool burden without evidence of enteric obstruction.
Normal appearance of the terminal ileum and air-filled appendix. No
evidence of enteric obstruction. No pneumoperitoneum, pneumatosis or
portal venous gas.

Vascular/Lymphatic: Moderate to large amount of eccentric mixed
calcified and noncalcified atherosclerotic plaque within a mildly
ectatic abdominal aorta. The abdominal aorta measures approximately
2.1 x 2.3 cm (as measured in greatest oblique short axis axial -
image 39, series 2 and coronal - image 41, series 5) dimensions
respectively. There is a moderate amount of crescentic largely
noncalcified thrombus/plaque within this dominant ectatic portion of
the abdominal aorta. No abdominal aortic dissection or periaortic
stranding.

Suspected severe hemodynamically significant narrowings involving
the bilateral common iliac arteries as well as the origins of the
bilateral external iliac arteries, incompletely evaluated on this
non CTA examination.

Reproductive: Age-appropriate atrophy of the pelvic organs. No
discrete adnexal lesion. No free fluid in the pelvic cul-de-sac.

Other: Regional soft tissues appear normal.

Musculoskeletal: No acute or aggressive osseous abnormalities. Mild
to moderate DDD of L5-S1 with disc space height loss, endplate
irregularity and small posteriorly directed disc osteophyte complex
at this location.
IMPRESSION: 1. No explanation for patient's persistent postprandial abdominal
pain. Specifically, no evidence of complication following recent
cholecystectomy. No radio opaque choledocholithiasis or intra /
extrahepatic biliary duct dilatation
2. Moderate to large amount of atherosclerotic plaque within the
abdominal aorta with suspected hemodynamically significant
narrowings involving the bilateral common and external iliac
arteries, incompletely evaluated on this non CTA examination.
Correlation for PAD symptoms is recommended - aortic Atherosclerosis
([GZ]-170.0)
3. Mild ectasia of the infrarenal abdominal aorta measuring 2.3 cm
in diameter.
4. Advanced emphysematous change within the imaged lung bases.
Emphysema. ([GZ]-[GZ])

## 2015-10-19 MED ORDER — IOPAMIDOL (ISOVUE-300) INJECTION 61%
100.0000 mL | Freq: Once | INTRAVENOUS | Status: AC | PRN
  Administered 2015-10-19: 100 mL via INTRAVENOUS

## 2015-11-01 ENCOUNTER — Other Ambulatory Visit: Payer: Self-pay | Admitting: Gastroenterology

## 2015-11-01 DIAGNOSIS — R109 Unspecified abdominal pain: Secondary | ICD-10-CM

## 2015-11-01 DIAGNOSIS — R935 Abnormal findings on diagnostic imaging of other abdominal regions, including retroperitoneum: Secondary | ICD-10-CM

## 2015-11-14 ENCOUNTER — Ambulatory Visit
Admission: RE | Admit: 2015-11-14 | Discharge: 2015-11-14 | Disposition: A | Discharge status: Home / Self Care | Payer: Medicare Other | Source: Ambulatory Visit | Attending: Gastroenterology | Admitting: Gastroenterology

## 2015-11-14 DIAGNOSIS — J439 Emphysema, unspecified: Secondary | ICD-10-CM | POA: Insufficient documentation

## 2015-11-14 DIAGNOSIS — I708 Atherosclerosis of other arteries: Secondary | ICD-10-CM | POA: Insufficient documentation

## 2015-11-14 DIAGNOSIS — Z9049 Acquired absence of other specified parts of digestive tract: Secondary | ICD-10-CM | POA: Diagnosis not present

## 2015-11-14 DIAGNOSIS — R935 Abnormal findings on diagnostic imaging of other abdominal regions, including retroperitoneum: Secondary | ICD-10-CM | POA: Insufficient documentation

## 2015-11-14 DIAGNOSIS — I700 Atherosclerosis of aorta: Secondary | ICD-10-CM | POA: Diagnosis not present

## 2015-11-14 DIAGNOSIS — R109 Unspecified abdominal pain: Secondary | ICD-10-CM | POA: Insufficient documentation

## 2015-11-14 DIAGNOSIS — K449 Diaphragmatic hernia without obstruction or gangrene: Secondary | ICD-10-CM | POA: Insufficient documentation

## 2015-11-14 HISTORY — DX: Basal cell carcinoma of skin, unspecified: C44.91

## 2015-11-14 LAB — POCT I-STAT CREATININE: Creatinine, Ser: 0.9 mg/dL (ref 0.44–1.00)

## 2015-11-14 IMAGING — CT CT CTA ABD/PEL W/CM AND/OR W/O CM
3 of 4 series · 10 of 36 positions shown, 16 images · IV contrast (isovue)
Comparison: [DATE]

CLINICAL DATA: Right leg pain with peripheral vascular disease.
Aortoiliac atherosclerosis by recent CT [DATE].

EXAM:
CT ANGIOGRAPHY ABDOMEN AND PELVIS
TECHNIQUE: Multidetector CT imaging of the abdomen and pelvis was performed
using the standard protocol during bolus administration of
intravenous contrast. Multiplanar reconstructed images including
MIPs were obtained and reviewed to evaluate the vascular anatomy.
CONTRAST:  100 cc Isovue 370

[Series 4: axial arterial · axial · arterial · 0.65mm/px · z∈[-1038,-936]mm · 3 of 215 slices shown]
[im 21/215  soft-tissue]
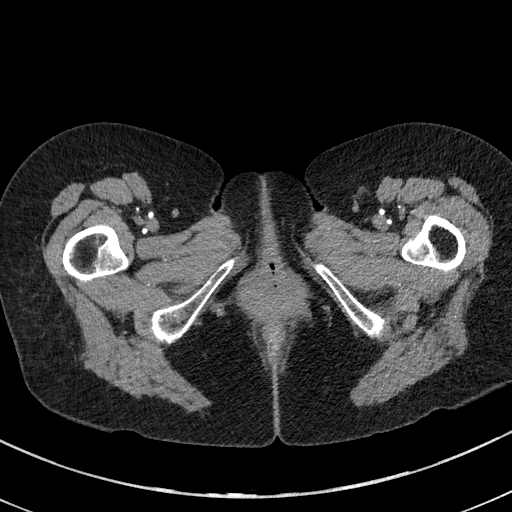
[im 41/215  soft-tissue]
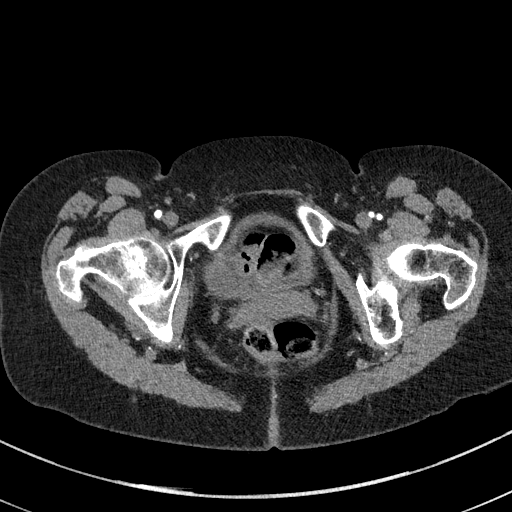
[im 72/215  soft-tissue]
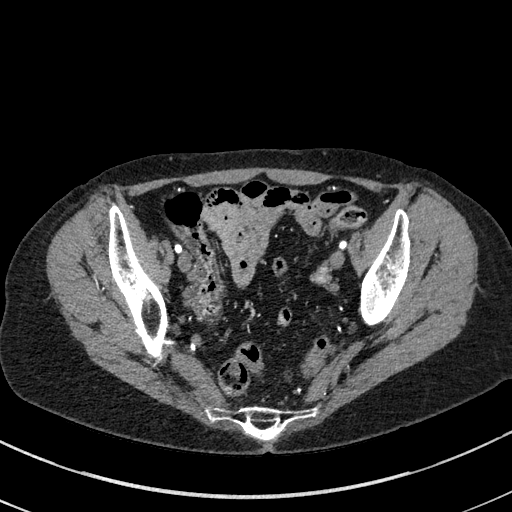

[Series 7: axial venous · axial · portal-venous · 0.63mm/px · z∈[-1015,-715]mm · 6 of 86 slices shown, 11 images]
[im 13/86  soft-tissue]
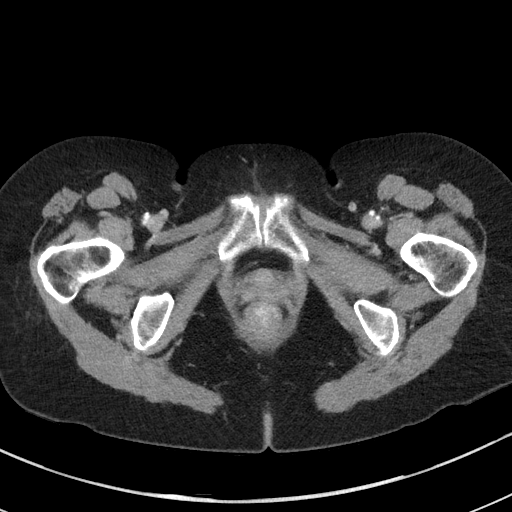
[im 13/86  bone]
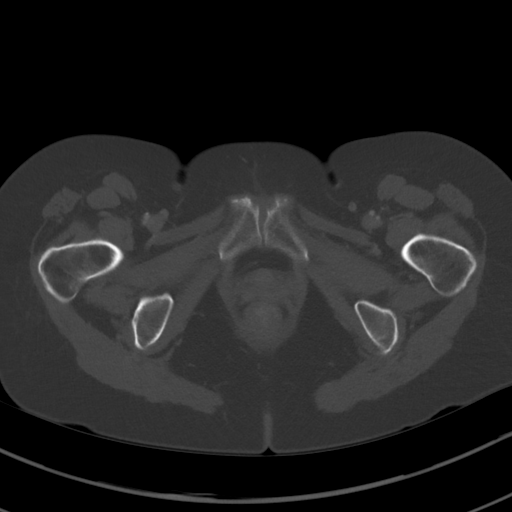
[im 25/86  soft-tissue]
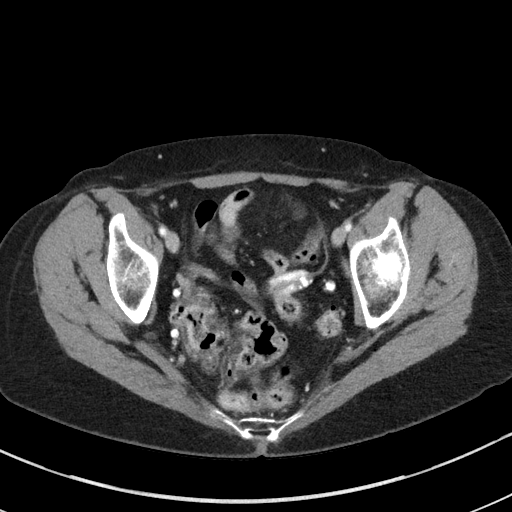
[im 37/86  soft-tissue]
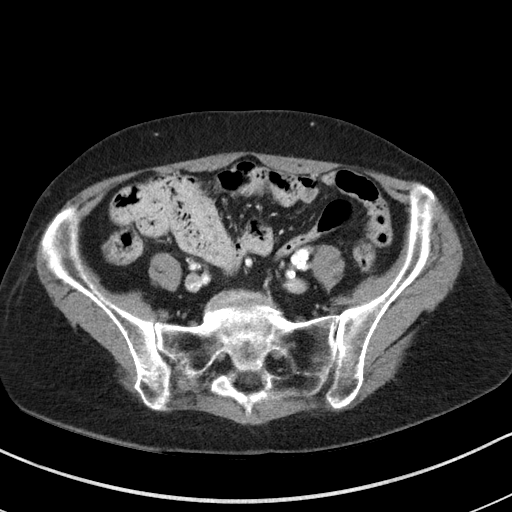
[im 37/86  lung]
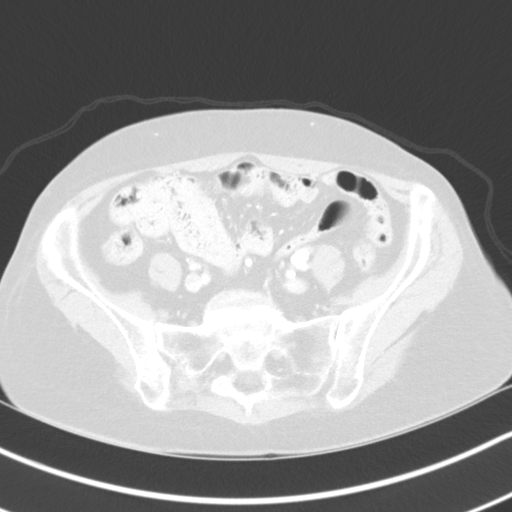
[im 49/86  soft-tissue]
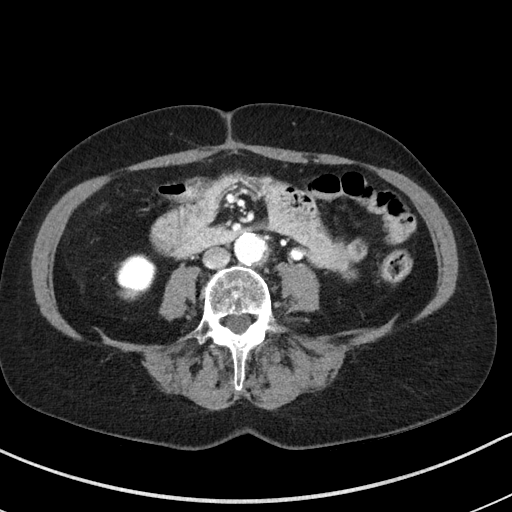
[im 49/86  lung]
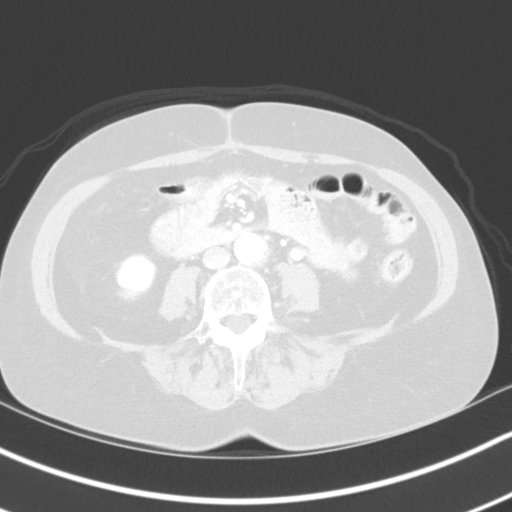
[im 61/86  soft-tissue]
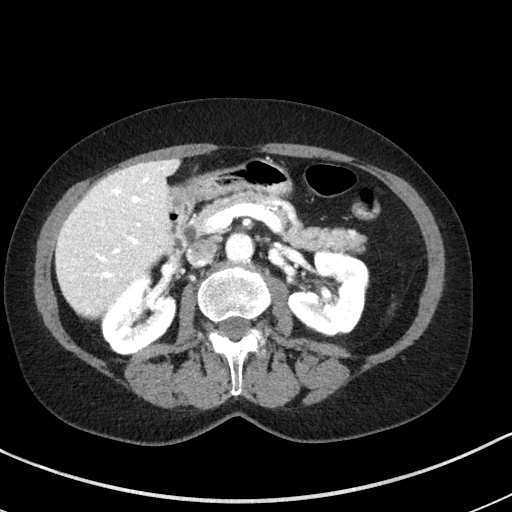
[im 61/86  lung]
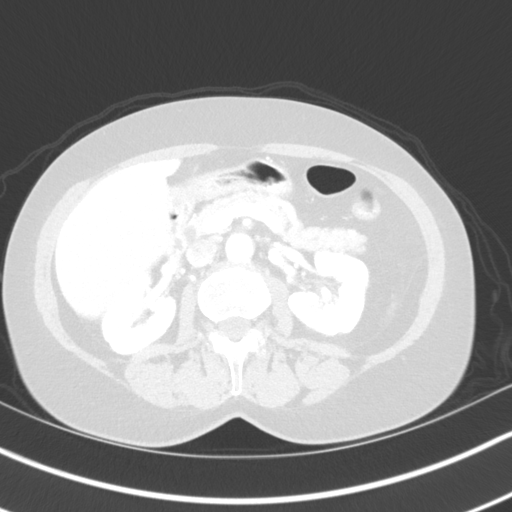
[im 73/86  soft-tissue]
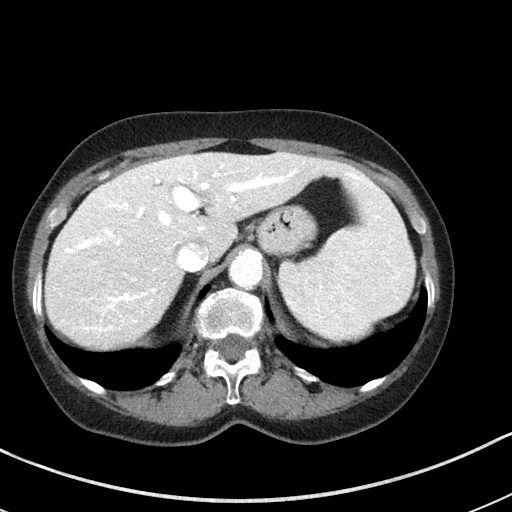
[im 73/86  lung]
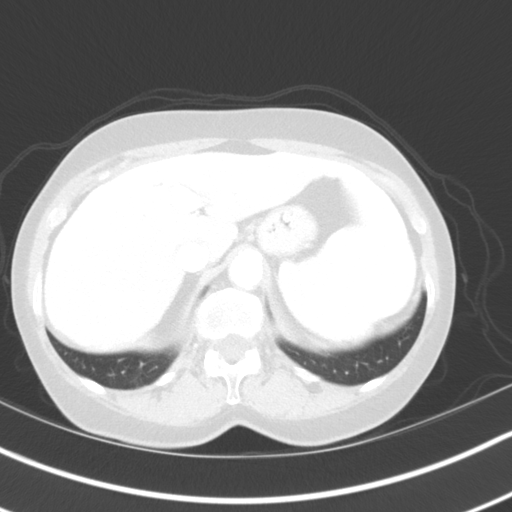

[Series 14: sagittals · sagittal · 0.48mm/px · 1 of 167 slices shown, 2 images]
[im 56/167  soft-tissue]
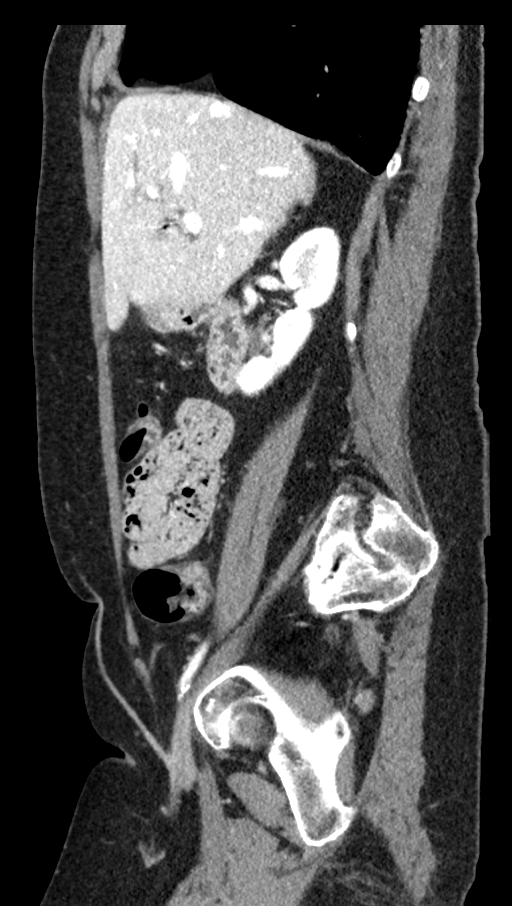
[im 56/167  bone]
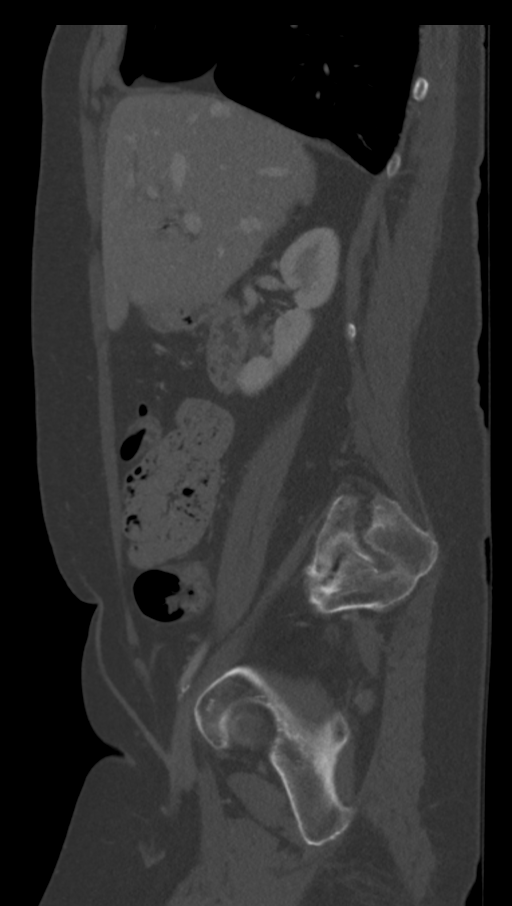

[10 of 36 positions shown; findings below may reference images not displayed]

FINDINGS: Arterial findings:

Aorta: Diffuse atherosclerotic changes of the lower thoracic and
abdominal aorta with scattered calcification and ectasia. Distal
infrarenal aorta maximal diameter 2.3 x 2.3 cm, image 39 series 7.
Negative for dissection or significant aneurysm. No occlusion or
retroperitoneal hemorrhage.

Celiac axis:          Widely patent origin and branches.

Superior mesenteric: Minor atherosclerotic origin with slight
luminal narrowing, less than 50%.

Left renal: Minor atherosclerotic origin without stenosis or
occlusion. No accessory renal artery.

Right renal: Irregular atherosclerotic origin with a small ulcerated
plaque proximally. No associated significant stenosis or occlusion.
No accessory renal artery.

Inferior mesenteric:  Small in caliber but remains patent.

Left iliac: Heavily calcified atherosclerotic left common iliac
artery with luminal narrowing estimated greater than 50%, image 112.
Irregularity and luminal narrowings appear to extend throughout the
entire left common iliac artery without occlusion. Left internal and
external iliac arteries are also atherosclerotic and small in
caliber but remain patent.

Right iliac: Heavily calcified right common iliac artery without
occlusion. Luminal narrowing estimated less than 50%. Right internal
and external iliac arteries are atherosclerotic, small in caliber
but remain patent. No iliac occlusion.

Venous findings: Hepatic, portal, splenic, mesenteric, and renal
veins appear patent. IVC and iliac veins are poorly opacified.

Review of the MIP images confirms the above findings.

Nonvascular findings:

Lower chest: Lung bases appear hyperinflated compatible with
emphysema. Small hiatal hernia noted. No pericardial or pleural
effusion.

Remote cholecystectomy evident. Stable mild biliary prominence as
before. No biliary obstruction or intrahepatic biliary dilatation.
No focal hepatic abnormality. Spleen is mildly enlarged, unchanged.

Pancreas, adrenal glands, and kidneys are within normal limits for
age and demonstrate no acute process.

Negative for bowel obstruction, significant dilatation, ileus, or
free air. Low-lying cecum within the pelvis. Stable air distended
appendix without acute inflammation. No abdominal or pelvic fluid
collection, abscess, or hemorrhage.

No inguinal abnormality or hernia.

No acute osseous finding. Minor thoracic and lumbar degenerative
changes.
IMPRESSION: Abdominal aortoiliac atherosclerosis with mild infrarenal distal
aorta ectasia, maximal diameter 2.3 cm.

Bilateral pelvic iliac atherosclerosis left greater the right. Left
common iliac proximal stenosis is estimated greater than 50%. Right
iliac mild luminal narrowing estimated less than 50%. No iliac
occlusion.

Small hiatal hernia

Pulmonary emphysema

Remote cholecystectomy

## 2015-11-14 MED ORDER — IOPAMIDOL (ISOVUE-370) INJECTION 76%
100.0000 mL | Freq: Once | INTRAVENOUS | Status: AC | PRN
  Administered 2015-11-14: 100 mL via INTRAVENOUS

## 2015-11-28 ENCOUNTER — Ambulatory Visit: Payer: Medicare Other | Admitting: General Surgery

## 2015-12-06 ENCOUNTER — Other Ambulatory Visit: Payer: Self-pay | Admitting: Vascular Surgery

## 2015-12-26 ENCOUNTER — Other Ambulatory Visit
Admission: RE | Admit: 2015-12-26 | Discharge: 2015-12-26 | Disposition: A | Discharge status: Home / Self Care | Payer: Medicare Other | Source: Ambulatory Visit | Attending: Vascular Surgery | Admitting: Vascular Surgery

## 2015-12-26 DIAGNOSIS — Z79899 Other long term (current) drug therapy: Secondary | ICD-10-CM | POA: Insufficient documentation

## 2015-12-26 DIAGNOSIS — Z880 Allergy status to penicillin: Secondary | ICD-10-CM | POA: Diagnosis not present

## 2015-12-26 DIAGNOSIS — Z888 Allergy status to other drugs, medicaments and biological substances status: Secondary | ICD-10-CM | POA: Insufficient documentation

## 2015-12-26 DIAGNOSIS — J449 Chronic obstructive pulmonary disease, unspecified: Secondary | ICD-10-CM | POA: Diagnosis not present

## 2015-12-26 DIAGNOSIS — K219 Gastro-esophageal reflux disease without esophagitis: Secondary | ICD-10-CM | POA: Insufficient documentation

## 2015-12-26 DIAGNOSIS — I70219 Atherosclerosis of native arteries of extremities with intermittent claudication, unspecified extremity: Secondary | ICD-10-CM | POA: Diagnosis not present

## 2015-12-26 DIAGNOSIS — Z87891 Personal history of nicotine dependence: Secondary | ICD-10-CM | POA: Diagnosis not present

## 2015-12-26 DIAGNOSIS — Z01812 Encounter for preprocedural laboratory examination: Secondary | ICD-10-CM | POA: Diagnosis present

## 2015-12-26 DIAGNOSIS — R1013 Epigastric pain: Secondary | ICD-10-CM | POA: Insufficient documentation

## 2015-12-26 LAB — CREATININE, SERUM: Creatinine, Ser: 0.69 mg/dL (ref 0.44–1.00)

## 2015-12-26 LAB — BUN: BUN: 6 mg/dL (ref 6–20)

## 2015-12-27 ENCOUNTER — Ambulatory Visit
Admission: RE | Admit: 2015-12-27 | Discharge: 2015-12-27 | Disposition: A | Discharge status: Home / Self Care | Payer: Medicare Other | Source: Ambulatory Visit | Attending: Vascular Surgery | Admitting: Vascular Surgery

## 2015-12-27 ENCOUNTER — Encounter: Payer: Self-pay | Admitting: *Deleted

## 2015-12-27 ENCOUNTER — Encounter
Admission: RE | Disposition: A | Discharge status: Home / Self Care | Payer: Self-pay | Source: Ambulatory Visit | Attending: Vascular Surgery

## 2015-12-27 DIAGNOSIS — E119 Type 2 diabetes mellitus without complications: Secondary | ICD-10-CM | POA: Diagnosis not present

## 2015-12-27 DIAGNOSIS — I719 Aortic aneurysm of unspecified site, without rupture: Secondary | ICD-10-CM | POA: Diagnosis not present

## 2015-12-27 DIAGNOSIS — Z8673 Personal history of transient ischemic attack (TIA), and cerebral infarction without residual deficits: Secondary | ICD-10-CM | POA: Diagnosis not present

## 2015-12-27 DIAGNOSIS — I509 Heart failure, unspecified: Secondary | ICD-10-CM | POA: Insufficient documentation

## 2015-12-27 DIAGNOSIS — Z880 Allergy status to penicillin: Secondary | ICD-10-CM | POA: Diagnosis not present

## 2015-12-27 DIAGNOSIS — Z9049 Acquired absence of other specified parts of digestive tract: Secondary | ICD-10-CM | POA: Insufficient documentation

## 2015-12-27 DIAGNOSIS — J449 Chronic obstructive pulmonary disease, unspecified: Secondary | ICD-10-CM | POA: Diagnosis not present

## 2015-12-27 DIAGNOSIS — K219 Gastro-esophageal reflux disease without esophagitis: Secondary | ICD-10-CM | POA: Diagnosis not present

## 2015-12-27 DIAGNOSIS — Z8679 Personal history of other diseases of the circulatory system: Secondary | ICD-10-CM | POA: Diagnosis not present

## 2015-12-27 DIAGNOSIS — Z85118 Personal history of other malignant neoplasm of bronchus and lung: Secondary | ICD-10-CM | POA: Insufficient documentation

## 2015-12-27 DIAGNOSIS — I110 Hypertensive heart disease with heart failure: Secondary | ICD-10-CM | POA: Insufficient documentation

## 2015-12-27 DIAGNOSIS — E7800 Pure hypercholesterolemia, unspecified: Secondary | ICD-10-CM | POA: Insufficient documentation

## 2015-12-27 DIAGNOSIS — I499 Cardiac arrhythmia, unspecified: Secondary | ICD-10-CM | POA: Diagnosis not present

## 2015-12-27 DIAGNOSIS — Z853 Personal history of malignant neoplasm of breast: Secondary | ICD-10-CM | POA: Insufficient documentation

## 2015-12-27 DIAGNOSIS — Z888 Allergy status to other drugs, medicaments and biological substances status: Secondary | ICD-10-CM | POA: Insufficient documentation

## 2015-12-27 DIAGNOSIS — I252 Old myocardial infarction: Secondary | ICD-10-CM | POA: Diagnosis not present

## 2015-12-27 DIAGNOSIS — I70213 Atherosclerosis of native arteries of extremities with intermittent claudication, bilateral legs: Secondary | ICD-10-CM | POA: Diagnosis not present

## 2015-12-27 HISTORY — DX: Reserved for inherently not codable concepts without codable children: IMO0001

## 2015-12-27 HISTORY — DX: Anxiety disorder, unspecified: F41.9

## 2015-12-27 HISTORY — PX: PERIPHERAL VASCULAR CATHETERIZATION: SHX172C

## 2015-12-27 SURGERY — LOWER EXTREMITY ANGIOGRAPHY
Anesthesia: Moderate Sedation | Laterality: Bilateral

## 2015-12-27 MED ORDER — MIDAZOLAM HCL 2 MG/2ML IJ SOLN
INTRAMUSCULAR | Status: DC | PRN
  Administered 2015-12-27 (×2): 1 mg via INTRAVENOUS
  Administered 2015-12-27: 0.5 mg via INTRAVENOUS
  Administered 2015-12-27: 2 mg via INTRAVENOUS
  Administered 2015-12-27: 0.5 mg via INTRAVENOUS

## 2015-12-27 MED ORDER — LIDOCAINE HCL (PF) 1 % IJ SOLN
INTRAMUSCULAR | Status: AC
  Filled 2015-12-27: qty 30

## 2015-12-27 MED ORDER — CLINDAMYCIN PHOSPHATE 300 MG/50ML IV SOLN
300.0000 mg | Freq: Once | INTRAVENOUS | Status: AC
  Administered 2015-12-27: 300 mg via INTRAVENOUS

## 2015-12-27 MED ORDER — DIPHENHYDRAMINE HCL 50 MG/ML IJ SOLN
12.5000 mg | Freq: Once | INTRAMUSCULAR | Status: AC
  Administered 2015-12-27: 12.5 mg via INTRAVENOUS

## 2015-12-27 MED ORDER — ONDANSETRON HCL 4 MG/2ML IJ SOLN
INTRAMUSCULAR | Status: AC
  Filled 2015-12-27: qty 2

## 2015-12-27 MED ORDER — LABETALOL HCL 5 MG/ML IV SOLN
10.0000 mg | INTRAVENOUS | Status: DC | PRN
  Administered 2015-12-27: 10 mg via INTRAVENOUS

## 2015-12-27 MED ORDER — ASPIRIN 325 MG PO TABS
325.0000 mg | ORAL_TABLET | ORAL | Status: AC
  Administered 2015-12-27: 325 mg via ORAL

## 2015-12-27 MED ORDER — MORPHINE SULFATE (PF) 2 MG/ML IV SOLN
INTRAVENOUS | Status: AC
  Filled 2015-12-27: qty 1

## 2015-12-27 MED ORDER — HYDRALAZINE HCL 20 MG/ML IJ SOLN: 5.0000 mg | INTRAMUSCULAR | Status: DC | PRN

## 2015-12-27 MED ORDER — METOPROLOL TARTRATE 5 MG/5ML IV SOLN: 5.0000 mg | Freq: Four times a day (QID) | INTRAVENOUS | Status: DC

## 2015-12-27 MED ORDER — HEPARIN SODIUM (PORCINE) 1000 UNIT/ML IJ SOLN
INTRAMUSCULAR | Status: AC
  Filled 2015-12-27: qty 1

## 2015-12-27 MED ORDER — DOCUSATE SODIUM 100 MG PO CAPS: 100.0000 mg | ORAL_CAPSULE | Freq: Every day | ORAL | Status: DC

## 2015-12-27 MED ORDER — FENTANYL CITRATE (PF) 100 MCG/2ML IJ SOLN
INTRAMUSCULAR | Status: AC
  Filled 2015-12-27: qty 2

## 2015-12-27 MED ORDER — METHYLPREDNISOLONE SODIUM SUCC 125 MG IJ SOLR: 125.0000 mg | INTRAMUSCULAR | Status: DC | PRN

## 2015-12-27 MED ORDER — FENTANYL CITRATE (PF) 100 MCG/2ML IJ SOLN
INTRAMUSCULAR | Status: DC | PRN
  Administered 2015-12-27: 25 ug via INTRAVENOUS
  Administered 2015-12-27: 50 ug via INTRAVENOUS
  Administered 2015-12-27 (×3): 25 ug via INTRAVENOUS
  Administered 2015-12-27: 50 ug via INTRAVENOUS

## 2015-12-27 MED ORDER — CLOPIDOGREL BISULFATE 75 MG PO TABS: 75.0000 mg | ORAL_TABLET | Freq: Every day | ORAL | 4 refills | Status: DC

## 2015-12-27 MED ORDER — DIPHENHYDRAMINE HCL 50 MG/ML IJ SOLN
INTRAMUSCULAR | Status: AC
  Filled 2015-12-27: qty 1

## 2015-12-27 MED ORDER — HYDROMORPHONE HCL 1 MG/ML IJ SOLN
1.0000 mg | Freq: Once | INTRAMUSCULAR | Status: AC
  Administered 2015-12-27: 1 mg via INTRAVENOUS

## 2015-12-27 MED ORDER — CLINDAMYCIN PHOSPHATE 300 MG/50ML IV SOLN
INTRAVENOUS | Status: AC
  Filled 2015-12-27: qty 50

## 2015-12-27 MED ORDER — ALUM & MAG HYDROXIDE-SIMETH 200-200-20 MG/5ML PO SUSP
ORAL | Status: AC
  Filled 2015-12-27: qty 30

## 2015-12-27 MED ORDER — MIDAZOLAM HCL 5 MG/5ML IJ SOLN
INTRAMUSCULAR | Status: AC
  Filled 2015-12-27: qty 5

## 2015-12-27 MED ORDER — OXYCODONE HCL 5 MG PO TABS
5.0000 mg | ORAL_TABLET | ORAL | Status: DC | PRN
  Filled 2015-12-27: qty 2

## 2015-12-27 MED ORDER — PANTOPRAZOLE SODIUM 40 MG PO TBEC: 40.0000 mg | DELAYED_RELEASE_TABLET | Freq: Every day | ORAL | Status: DC

## 2015-12-27 MED ORDER — CLOPIDOGREL BISULFATE 75 MG PO TABS
75.0000 mg | ORAL_TABLET | ORAL | Status: AC
  Administered 2015-12-27: 75 mg via ORAL

## 2015-12-27 MED ORDER — ACETAMINOPHEN 325 MG RE SUPP
325.0000 mg | RECTAL | Status: DC | PRN
  Filled 2015-12-27: qty 2

## 2015-12-27 MED ORDER — SODIUM CHLORIDE 0.9 % IV SOLN
INTRAVENOUS | Status: DC
  Administered 2015-12-27: 10:00:00 via INTRAVENOUS

## 2015-12-27 MED ORDER — LABETALOL HCL 5 MG/ML IV SOLN
INTRAVENOUS | Status: AC
  Filled 2015-12-27: qty 4

## 2015-12-27 MED ORDER — ALUM & MAG HYDROXIDE-SIMETH 200-200-20 MG/5ML PO SUSP
30.0000 mL | Freq: Once | ORAL | Status: AC
  Administered 2015-12-27: 30 mL via ORAL

## 2015-12-27 MED ORDER — ASPIRIN EC 325 MG PO TBEC
DELAYED_RELEASE_TABLET | ORAL | Status: AC
  Filled 2015-12-27: qty 1

## 2015-12-27 MED ORDER — LABETALOL HCL 5 MG/ML IV SOLN
INTRAVENOUS | Status: AC
  Administered 2015-12-27: 10 mg via INTRAVENOUS
  Filled 2015-12-27: qty 4

## 2015-12-27 MED ORDER — CLOPIDOGREL BISULFATE 75 MG PO TABS
ORAL_TABLET | ORAL | Status: AC
  Administered 2015-12-27: 75 mg via ORAL
  Filled 2015-12-27: qty 1

## 2015-12-27 MED ORDER — HEPARIN (PORCINE) IN NACL 2-0.9 UNIT/ML-% IJ SOLN
INTRAMUSCULAR | Status: AC
  Filled 2015-12-27: qty 1000

## 2015-12-27 MED ORDER — ACETAMINOPHEN 325 MG PO TABS: 325.0000 mg | ORAL_TABLET | ORAL | Status: DC | PRN

## 2015-12-27 MED ORDER — MORPHINE SULFATE (PF) 4 MG/ML IV SOLN
2.0000 mg | INTRAVENOUS | Status: DC | PRN
  Administered 2015-12-27: 2 mg via INTRAVENOUS

## 2015-12-27 MED ORDER — ONDANSETRON HCL 4 MG/2ML IJ SOLN
4.0000 mg | Freq: Four times a day (QID) | INTRAMUSCULAR | Status: DC | PRN
  Administered 2015-12-27: 4 mg via INTRAVENOUS

## 2015-12-27 MED ORDER — HEPARIN SODIUM (PORCINE) 1000 UNIT/ML IJ SOLN
INTRAMUSCULAR | Status: DC | PRN
  Administered 2015-12-27: 4000 [IU] via INTRAVENOUS

## 2015-12-27 MED ORDER — FAMOTIDINE 20 MG PO TABS: 40.0000 mg | ORAL_TABLET | ORAL | Status: DC | PRN

## 2015-12-27 MED ORDER — ONDANSETRON HCL 4 MG/2ML IJ SOLN: 4.0000 mg | Freq: Four times a day (QID) | INTRAMUSCULAR | Status: DC | PRN

## 2015-12-27 MED ORDER — HYDROMORPHONE HCL 1 MG/ML IJ SOLN
INTRAMUSCULAR | Status: AC
  Administered 2015-12-27: 1 mg via INTRAVENOUS
  Filled 2015-12-27: qty 1

## 2015-12-27 MED ORDER — ALUM & MAG HYDROXIDE-SIMETH 200-200-20 MG/5ML PO SUSP: 15.0000 mL | ORAL | Status: DC | PRN

## 2015-12-27 SURGICAL SUPPLY — 19 items
BALLN LUTONIX DCB 5X80X130 (BALLOONS) ×4
BALLN LUTONIX DCB 7X40X130 (BALLOONS) ×2
BALLN LUTONIX DCB 7X60X130 (BALLOONS) ×2
BALLOON LUTONIX DCB 5X80X130 (BALLOONS) IMPLANT
BALLOON LUTONIX DCB 7X40X130 (BALLOONS) IMPLANT
BALLOON LUTONIX DCB 7X60X130 (BALLOONS) IMPLANT
CATH PIG 70CM (CATHETERS) ×2 IMPLANT
DEVICE CLOSURE MYNXGRIP 6/7F (Vascular Products) ×1 IMPLANT
DEVICE PRESTO INFLATION (MISCELLANEOUS) ×2 IMPLANT
DEVICE STARCLOSE SE CLOSURE (Vascular Products) ×1 IMPLANT
PACK ANGIOGRAPHY (CUSTOM PROCEDURE TRAY) ×2 IMPLANT
SET INTRO CAPELLA COAXIAL (SET/KITS/TRAYS/PACK) ×3 IMPLANT
SHEATH BRITE TIP 5FRX11 (SHEATH) ×2 IMPLANT
SHEATH BRITE TIP 6FRX11 (SHEATH) ×2 IMPLANT
STENT OMNILINK ELITE 6X39X80 (Permanent Stent) ×2 IMPLANT
SYR MEDRAD MARK V 150ML (SYRINGE) ×2 IMPLANT
TUBING CONTRAST HIGH PRESS 72 (TUBING) ×2 IMPLANT
WIRE J 3MM .035X145CM (WIRE) ×2 IMPLANT
WIRE MAGIC TORQUE 260C (WIRE) ×2 IMPLANT

## 2015-12-27 NOTE — Discharge Instructions (Signed)
Angiogram, Care After °Refer to this sheet in the next few weeks. These instructions provide you with information about caring for yourself after your procedure. Your health care provider may also give you more specific instructions. Your treatment has been planned according to current medical practices, but problems sometimes occur. Call your health care provider if you have any problems or questions after your procedure. °WHAT TO EXPECT AFTER THE PROCEDURE °After your procedure, it is typical to have the following: °· Bruising at the catheter insertion site that usually fades within 1-2 weeks. °· Blood collecting in the tissue (hematoma) that may be painful to the touch. It should usually decrease in size and tenderness within 1-2 weeks. °HOME CARE INSTRUCTIONS °· Take medicines only as directed by your health care provider. °· You may shower 24-48 hours after the procedure or as directed by your health care provider. Remove the bandage (dressing) and gently wash the site with plain soap and water. Pat the area dry with a clean towel. Do not rub the site, because this may cause bleeding. °· Do not take baths, swim, or use a hot tub until your health care provider approves. °· Check your insertion site every day for redness, swelling, or drainage. °· Do not apply powder or lotion to the site. °· Do not lift over 10 lb (4.5 kg) for 5 days after your procedure or as directed by your health care provider. °· Ask your health care provider when it is okay to: °¨ Return to work or school. °¨ Resume usual physical activities or sports. °¨ Resume sexual activity. °· Do not drive home if you are discharged the same day as the procedure. Have someone else drive you. °· You may drive 24 hours after the procedure unless otherwise instructed by your health care provider. °· Do not operate machinery or power tools for 24 hours after the procedure or as directed by your health care provider. °· If your procedure was done as an  outpatient procedure, which means that you went home the same day as your procedure, a responsible adult should be with you for the first 24 hours after you arrive home. °· Keep all follow-up visits as directed by your health care provider. This is important. °SEEK MEDICAL CARE IF: °· You have a fever. °· You have chills. °· You have increased bleeding from the catheter insertion site. Hold pressure on the site. °SEEK IMMEDIATE MEDICAL CARE IF: °· You have unusual pain at the catheter insertion site. °· You have redness, warmth, or swelling at the catheter insertion site. °· You have drainage (other than a small amount of blood on the dressing) from the catheter insertion site. °· The catheter insertion site is bleeding, and the bleeding does not stop after 30 minutes of holding steady pressure on the site. °· The area near or just beyond the catheter insertion site becomes pale, cool, tingly, or numb. °  °This information is not intended to replace advice given to you by your health care provider. Make sure you discuss any questions you have with your health care provider. °  °Document Released: 10/05/2004 Document Revised: 04/09/2014 Document Reviewed: 08/20/2012 °Elsevier Interactive Patient Education ©2016 [employer] ° °

## 2015-12-27 NOTE — Op Note (Signed)
[facility]  Percutaneous Study/Intervention Procedural Note   Date of Surgery: 12/27/2015  Surgeon:Currie Dennin, Dolores Lory   Pre-operative Diagnosis: Atherosclerotic occlusive disease bilateral lower extremities with lifestyle limiting claudication bilaterally and mild rest pain symptoms on the left  Post-operative diagnosis:  Same  Procedure(s) Performed:  1.  Abdominal aortogram  2.  Bilateral distal runoff  3.  Percutaneous transluminal angioplasty and stent placement right common iliac artery; "kissing balloon" technique  4.  Percutaneous transluminal and plasty and stent placement left common iliac artery; "kissing balloon" technique  5.  Percutaneous transluminal angioplasty right external iliac to 5 mm  6.  Percutaneous transluminal angioplasty left external iliac to 5 mm  7.  Ultrasound guided access bilateral common femoral arteries   Anesthesia: Conscious sedation was administered under my direct supervision. IV Versed plus fentanyl were utilized. Continuous ECG, pulse oximetry and blood pressure was monitored throughout the entire procedure. Conscious sedation was for a total of 70 minutes.  Sheath: 6 French sheath retrograde right common femoral artery; 6 French sheath retrograde left common femoral artery  Contrast: 80 cc  Fluoroscopy Time: 26.4 minutes  Indications:  Patient was sent to the office for abdominal pain of a nonspecific etiology is in association with severe leg pain. CT angios had already been done and on review of the study with the patient her visceral vessels were noted to be widely patent however her common iliac and external iliac arteries were noted to have profound disease. The risks and benefits for iliac intervention were reviewed all questions were answered patient has agreed to proceed.  Procedure:  Shirley Barton a 65 y.o. female who was identified and appropriate procedural time out was performed.  The patient was then  placed supine on the table and prepped and draped in the usual sterile fashion.  Ultrasound was used to evaluate the right common femoral artery.  It was patent .  A digital ultrasound image was acquired.  A Seldinger needle was used to access the right common femoral artery under direct ultrasound guidance and a permanent image was performed.  A 0.035 J wire was advanced without resistance and a 5Fr sheath was placed.    The pigtail catheter was then positioned at the level of T12 and an AP image of the aorta was obtained. After review the images the pigtail catheter was repositioned above the aortic bifurcation and bilateral oblique views of the pelvis were obtained. Subsequently the detector was returned to the AP position and bilateral lower extremity runoff was obtained.  After review the images the ultrasound was delivered back onto the sterile field. The left common femoral was then imaged with the ultrasound it was noted to be echolucent and pulsatile indicating patency. Images recorded for the permanent record. Under real-time visualization a microneedle was inserted into the anterior wall the common femoral artery microwire was then advanced without difficulty under fluoroscopic guidance followed by placement of the micro-sheath.  A Magic torque wire was then negotiated under fluoroscopic guidance into the aorta. 6 French sheath was then placed.  4000 Units of heparin was given and allowed to circulate for proximally 4 minutes.  The right sheath was then upsized to a 6 [place] sheath as well after a Magic torque wire was advanced through the pigtail catheter. Magnified images of the aortic bifurcation were then made using hand injection contrast from the femoral sheaths. After appropriate sizing a 6 x 40 Omnilink stent was selected for the right and a 6  x 40 Omnilink stent was selected for the left. There were then advanced and positioned just above the aortic bifurcation. Insufflation for full  expansion of the stents was performed simultaneously. Follow-up imaging was then performed and a 7 x 40 Lutonix balloon was advanced up the right and then a 7 x 60 Lutonix balloon was advanced up the left. Simultaneous inflation to 10 atm for was performed for 1 minute. Follow-up imaging demonstrated excellent result.  Attention was then turned to the external iliac arteries LAO projection was then obtained of the right external iliac artery and subsequently a 5 x 80 Lutonix balloon was advanced up the right side inflation was to 10 atm for 2 full minutes. Follow-up imaging demonstrated an excellent result with wide patency and less then 5% residual stenosis throughout the entire course of the external iliac. In a similar fashion the left side was then treated in the RAO projection a 5 x 80 Lutonix balloon was used and the inflation was to 10 atm 2 full minutes. Residual stenosis noted on follow-up imaging was less than 5%.  Oblique views were then obtained of the groins in succession and a minx device was used on the left Star close device was used on the right. Both were deployed without difficulty. There were no immediate complication on the right on the left a small clinically insignificant hematoma was noted and so pressure was held for 10 minutes with complete resolution.   Findings:   Aortogram:  The abdominal aorta is opacified with a bolus injection contrast. Demonstrates diffuse disease but there are no hemodynamically significant lesions noted until the distal aortic bifurcation where bilateral greater than 70% ostial iliac lesions are identified. There is moderate poststenotic dilatation noted of the common iliac arteries as well. There is greater than 60% stenosis of the external iliac arteries bilaterally.  Right Lower Extremity:  The right common femoral is patent as is the profunda femoris and SFA.  Left Lower Extremity:  The left common femoral is patent although quite small and has  moderate disease this is the reason the minx device was selected for this side. Profunda femoris and SFA are patent  Following placement of the iliac stents there is now wide patency with less than 5% residual stenosis with rapid flow through the aortic bifurcation bilaterally. Also successful intervention within the external iliacs with smooth widely patent external iliac arteries.  Summary:  Successful reconstruction of the distal aorta and bilateral iliac arteries  Disposition: Patient was taken to the recovery room in stable condition having tolerated the procedure well.  Aarav Burgett, Dolores Lory 12/27/2015,12:21 PM

## 2015-12-27 NOTE — H&P (Signed)
[facility] History & Physical Update  The patient was interviewed and re-examined.  The patient's previous History and Physical has been reviewed and is unchanged.  There is no change in the plan of care. We plan to proceed with the scheduled procedure.  Shirley Barton, Dolores Lory, MD  12/27/2015, 10:49 AM

## 2015-12-28 ENCOUNTER — Encounter: Payer: Self-pay | Admitting: Vascular Surgery

## 2016-01-10 ENCOUNTER — Telehealth (INDEPENDENT_AMBULATORY_CARE_PROVIDER_SITE_OTHER): Payer: Self-pay

## 2016-01-10 NOTE — Telephone Encounter (Signed)
Spoke with patient regarding what Shirley Barton. Stated was the best course of action. See note below.

## 2016-01-10 NOTE — Telephone Encounter (Signed)
If any fever, nausea, vomiting or drainage from wound she should be seen. Right now she can apply warm compresses to the area with elevation. If it does not start to decrease in size in one week we should bring her in to be seen.

## 2016-01-10 NOTE — Telephone Encounter (Signed)
Patient called stating she has a knot at her left side incision the size of an egg, it creates pain down her leg and in her private parts. No fever what to do ?

## 2016-01-23 ENCOUNTER — Encounter (INDEPENDENT_AMBULATORY_CARE_PROVIDER_SITE_OTHER): Payer: Medicare Other

## 2016-01-23 ENCOUNTER — Ambulatory Visit (INDEPENDENT_AMBULATORY_CARE_PROVIDER_SITE_OTHER): Payer: Medicare Other | Admitting: Vascular Surgery

## 2016-01-25 ENCOUNTER — Ambulatory Visit (INDEPENDENT_AMBULATORY_CARE_PROVIDER_SITE_OTHER): Payer: Medicare Other

## 2016-01-25 ENCOUNTER — Encounter (INDEPENDENT_AMBULATORY_CARE_PROVIDER_SITE_OTHER): Payer: Self-pay

## 2016-01-25 ENCOUNTER — Other Ambulatory Visit (INDEPENDENT_AMBULATORY_CARE_PROVIDER_SITE_OTHER): Payer: Self-pay | Admitting: Vascular Surgery

## 2016-01-25 ENCOUNTER — Encounter (INDEPENDENT_AMBULATORY_CARE_PROVIDER_SITE_OTHER): Payer: Self-pay | Admitting: Vascular Surgery

## 2016-01-25 ENCOUNTER — Ambulatory Visit (INDEPENDENT_AMBULATORY_CARE_PROVIDER_SITE_OTHER): Payer: Medicare Other | Admitting: Vascular Surgery

## 2016-01-25 VITALS — BP 136/98 | HR 98 | Resp 16 | Ht 64.5 in | Wt 124.0 lb

## 2016-01-25 DIAGNOSIS — I739 Peripheral vascular disease, unspecified: Secondary | ICD-10-CM | POA: Diagnosis not present

## 2016-01-25 DIAGNOSIS — I70213 Atherosclerosis of native arteries of extremities with intermittent claudication, bilateral legs: Secondary | ICD-10-CM

## 2016-01-25 DIAGNOSIS — I771 Stricture of artery: Secondary | ICD-10-CM

## 2016-01-25 NOTE — Progress Notes (Signed)
Subjective:    Patient ID: Shirley Barton, female    DOB: 09-05-1950, 65 y.o.   MRN: [id number] Chief Complaint  Patient presents with  . Routine Post Op    3 week follow up   Patient presents for her first post-procedure follow up. She is s/p angioplasty and stent placement of her bilateral iliac arteries on 12/27/15. Her post-procedure course has been uneventful. She reports an overall improvement in her symptoms. The patient underwent an ABI which showed Right ABI: 0.98 and Left 0.98 (no previous for comparison). The patient denies any claudication like symptoms, rest pain or ulcers to her feet / toes.    Review of Systems  Constitutional: Negative.   HENT: Negative.   Eyes: Negative.   Respiratory: Negative.   Cardiovascular: Negative.   Gastrointestinal: Negative.   Endocrine: Negative.   Genitourinary: Negative.   Musculoskeletal: Negative.   Skin: Negative.   Allergic/Immunologic: Negative.   Neurological: Negative.   Hematological: Negative.   Psychiatric/Behavioral: Negative.       Objective:   Physical Exam  Constitutional: She is oriented to person, place, and time. She appears well-developed and well-nourished.  HENT:  Head: Normocephalic and atraumatic.  Eyes: Conjunctivae and EOM are normal. Pupils are equal, round, and reactive to light.  Neck: Normal range of motion.  Cardiovascular: Normal rate, normal heart sounds and intact distal pulses.   Pulses:      Radial pulses are 2+ on the right side, and 2+ on the left side.       Dorsalis pedis pulses are 2+ on the right side, and 2+ on the left side.       Posterior tibial pulses are 2+ on the right side, and 2+ on the left side.  Pulmonary/Chest: Effort normal and breath sounds normal.  Abdominal: Soft. Bowel sounds are normal.  Musculoskeletal: Normal range of motion. She exhibits no edema.  Neurological: She is alert and oriented to person, place, and time.  Skin: Skin is warm and dry.  Psychiatric: She  has a normal mood and affect. Her behavior is normal. Judgment and thought content normal.   BP (!) 136/98 (BP Location: Left Arm)   Pulse 98   Resp 16   Ht 5' 4.5" (1.638 m)   Wt 124 lb (56.2 kg)   LMP 01/03/1995 (Approximate)   BMI 20.96 kg/m   Past Medical History:  Diagnosis Date  . Anxiety   . Back pain   . Basal cell carcinoma of skin 2013   resected from Left scalp area.   Marland Kitchen COPD (chronic obstructive pulmonary disease) ([place])   . Depression   . GERD (gastroesophageal reflux disease)   . Hypertension   . Shortness of breath dyspnea    Social History   Social History  . Marital status: Divorced    Spouse name: N/A  . Number of children: N/A  . Years of education: N/A   Occupational History  . Not on file.   Social History Main Topics  . Smoking status: Former Smoker    Quit date: 03/02/2012  . Smokeless tobacco: Never Used  . Alcohol use No  . Drug use: No  . Sexual activity: Not on file   Other Topics Concern  . Not on file   Social History Narrative  . No narrative on file   Past Surgical History:  Procedure Laterality Date  . BASAL CELL CARCINOMA EXCISION  2013  . CARPAL TUNNEL RELEASE Left 90s  . CHOLECYSTECTOMY N/A 09/16/2015  Procedure: LAPAROSCOPIC CHOLECYSTECTOMY WITH INTRAOPERATIVE CHOLANGIOGRAM;  Surgeon: Robert Bellow, MD;  Location: [facility] ORS;  Service: General;  Laterality: N/A;  . COLONOSCOPY  [id number]   [place]  . PERIPHERAL VASCULAR CATHETERIZATION Bilateral 12/27/2015   Procedure: Lower Extremity Angiography;  Surgeon: Katha Cabal, MD;  Location: [facility];  Service: Cardiovascular;  Laterality: Bilateral;  . TONSILLECTOMY     Family History  Problem Relation Age of Onset  . Colon polyps Sister   . Cerebral palsy Daughter    Allergies  Allergen Reactions  . Tramadol   . Cortisone Rash    In 2013  . Cortizone-10 [Hydrocortisone] Rash  . Penicillins Rash      Assessment & Plan:  Patient presents for her  first post-procedure follow up. She is s/p angioplasty and stent placement of her bilateral iliac arteries on 12/27/15. Her post-procedure course has been uneventful. She reports an overall improvement in her symptoms. The patient underwent an ABI which showed Right ABI: 0.98 and Left 0.98 (no previous for comparison). The patient denies any claudication like symptoms, rest pain or ulcers to her feet / toes.   1. PAD (peripheral artery disease) (HCC) - Improved Normal ABI's and improvement in symptoms s/p intervention. No additional intervention needed at this time. Patient to follow up in three months with ABI and aorto-iliac duplex.   2. Iliac artery stenosis, bilateral (HCC) - Improved Bilateral iliac stents in place with improvement in symptoms. Patient to continue taking Plavix and ASA daily.   3. Atherosclerosis of native artery of both lower extremities with intermittent claudication (HCC) - Improved Symptoms improved with normal ABI's s/p intervention.  Current Outpatient Prescriptions on File Prior to Visit  Medication Sig Dispense Refill  . acetaminophen (TYLENOL) 325 MG tablet Take 650 mg by mouth every 6 (six) hours as needed.    . clopidogrel (PLAVIX) 75 MG tablet Take 1 tablet (75 mg total) by mouth daily. 30 tablet 4  . Fluticasone-Salmeterol (ADVAIR) 100-50 MCG/DOSE AEPB Inhale 1 puff into the lungs 2 (two) times daily.    . metoCLOPramide (REGLAN) 5 MG tablet Take 1 tablet (5 mg total) by mouth 2 (two) times daily. 30 tablet 0  . ondansetron (ZOFRAN ODT) 4 MG disintegrating tablet Take 1 tablet (4 mg total) by mouth every 6 (six) hours as needed for nausea or vomiting. 20 tablet 0  . pantoprazole (PROTONIX) 40 MG tablet Take by mouth.     No current facility-administered medications on file prior to visit.     There are no Patient Instructions on file for this visit. Return in about 3 months (around 04/26/2016) for ABI and Aorto-Iliac.   Merrissa Giacobbe A Kodi Guerrera, PA-C

## 2016-01-26 ENCOUNTER — Telehealth: Payer: Self-pay | Admitting: *Deleted

## 2016-01-26 NOTE — Telephone Encounter (Signed)
I had called patient since she was unable to come to her 2 month follow up post lap cholecystectomy on 09-16-15. She has been seeing Dr Delana Meyer in vascular and has not had her upper endoscopy as of yet. She states she is doing well from her surgery and wants to tell Dr Bary Castilla "Thank you-Thank You-Thank You". The patient is aware to call back for any questions or concerns.

## 2016-04-26 ENCOUNTER — Encounter (INDEPENDENT_AMBULATORY_CARE_PROVIDER_SITE_OTHER): Payer: Medicare Other

## 2016-04-26 ENCOUNTER — Ambulatory Visit (INDEPENDENT_AMBULATORY_CARE_PROVIDER_SITE_OTHER): Payer: Medicare Other | Admitting: Vascular Surgery

## 2016-05-17 ENCOUNTER — Encounter: Payer: Self-pay | Admitting: *Deleted

## 2016-05-18 ENCOUNTER — Encounter: Payer: Self-pay | Admitting: Anesthesiology

## 2016-05-18 ENCOUNTER — Encounter: Admission: RE | Payer: Self-pay | Source: Ambulatory Visit

## 2016-05-18 ENCOUNTER — Ambulatory Visit: Admission: RE | Admit: 2016-05-18 | Payer: Medicare Other | Source: Ambulatory Visit | Admitting: Gastroenterology

## 2016-05-18 SURGERY — ESOPHAGOGASTRODUODENOSCOPY (EGD) WITH PROPOFOL
Anesthesia: General

## 2016-05-18 SURGERY — EGD (ESOPHAGOGASTRODUODENOSCOPY)
Anesthesia: General

## 2016-06-07 ENCOUNTER — Ambulatory Visit (INDEPENDENT_AMBULATORY_CARE_PROVIDER_SITE_OTHER): Payer: Medicare Other | Admitting: Vascular Surgery

## 2016-06-07 ENCOUNTER — Encounter (INDEPENDENT_AMBULATORY_CARE_PROVIDER_SITE_OTHER): Payer: Medicare Other

## 2016-07-26 ENCOUNTER — Other Ambulatory Visit (INDEPENDENT_AMBULATORY_CARE_PROVIDER_SITE_OTHER): Payer: Self-pay | Admitting: Vascular Surgery

## 2016-09-20 ENCOUNTER — Other Ambulatory Visit: Payer: Self-pay | Admitting: Gastroenterology

## 2016-09-20 DIAGNOSIS — R1310 Dysphagia, unspecified: Secondary | ICD-10-CM

## 2016-09-27 ENCOUNTER — Ambulatory Visit
Admission: RE | Admit: 2016-09-27 | Discharge: 2016-09-27 | Disposition: A | Discharge status: Home / Self Care | Payer: Medicare Other | Source: Ambulatory Visit | Attending: Gastroenterology | Admitting: Gastroenterology

## 2016-09-27 DIAGNOSIS — K449 Diaphragmatic hernia without obstruction or gangrene: Secondary | ICD-10-CM | POA: Diagnosis not present

## 2016-09-27 DIAGNOSIS — R1310 Dysphagia, unspecified: Secondary | ICD-10-CM | POA: Diagnosis not present

## 2016-09-27 IMAGING — RF DG ESOPHAGUS
7 series · 14 of 14 positions shown · non-contrast
Comparison: None in PACs

CLINICAL DATA: Four years of dysphagia, gastroesophageal reflux.
The patient has chronic nausea and vomiting. Previous
cholecystectomy.

EXAM:
ESOPHOGRAM / BARIUM SWALLOW / BARIUM TABLET STUDY
TECHNIQUE: Combined double contrast and single contrast examination performed
using effervescent crystals, thick barium liquid, and thin barium
liquid. The patient was observed with fluoroscopy swallowing a 13 mm
barium sulphate tablet.
FLUOROSCOPY TIME:  Fluoroscopy Time:  0 minutes, 48 seconds
Radiation Exposure Index (if provided by the fluoroscopic device):
407 micro Gy per meters square
Number of Acquired Spot Images: 4 +3 video loops.

[Series 1: fluoro_barium 2fps_bw · 0.18mm/px · 1 of 1 slices shown (1 of 7)]
[im 1/1]
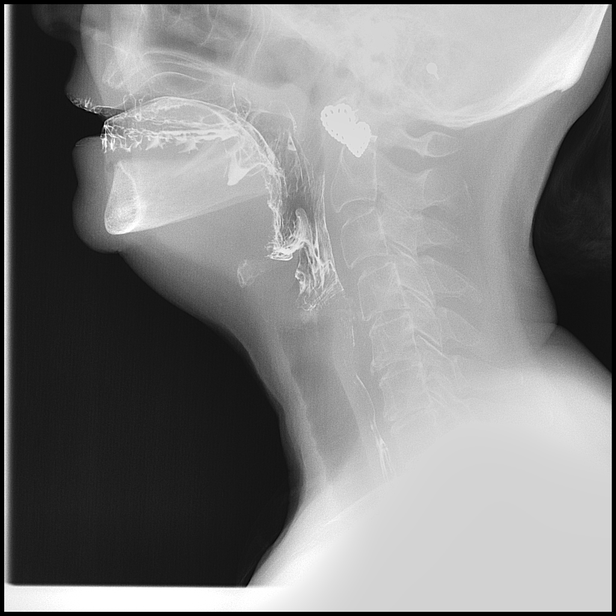

[Series 2: fluoro_barium 2fps_bw · 0.18mm/px · 1 of 1 slices shown (2 of 7)]
[im 1/1]
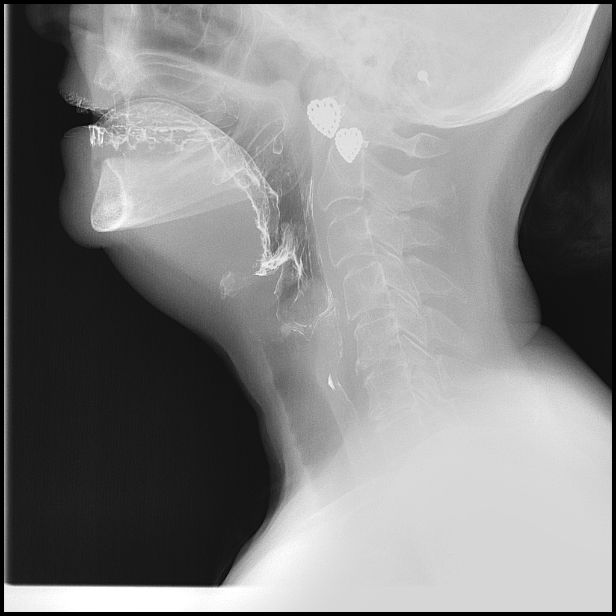

[Series 3: fluoro_barium 2fps_bw · 0.18mm/px · 1 of 1 slices shown (3 of 7)]
[im 1/1]
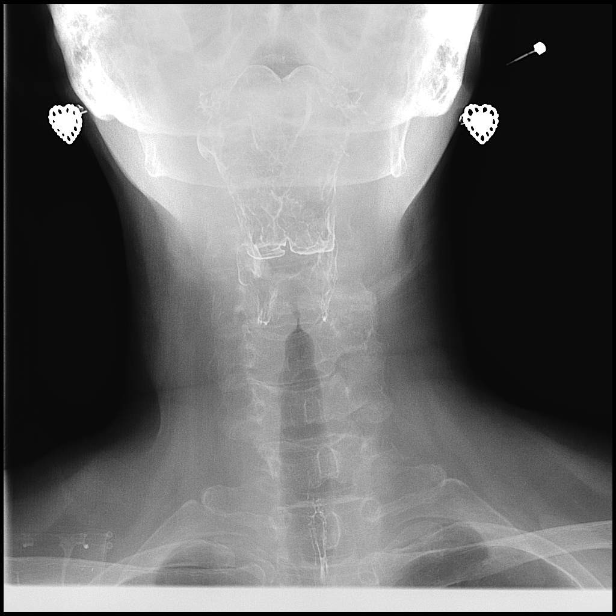

[Series 4: fluoro_barium 2fps_bw · 0.18mm/px · 3 of 12 frames shown (4 of 7)]
[frame 2/12]
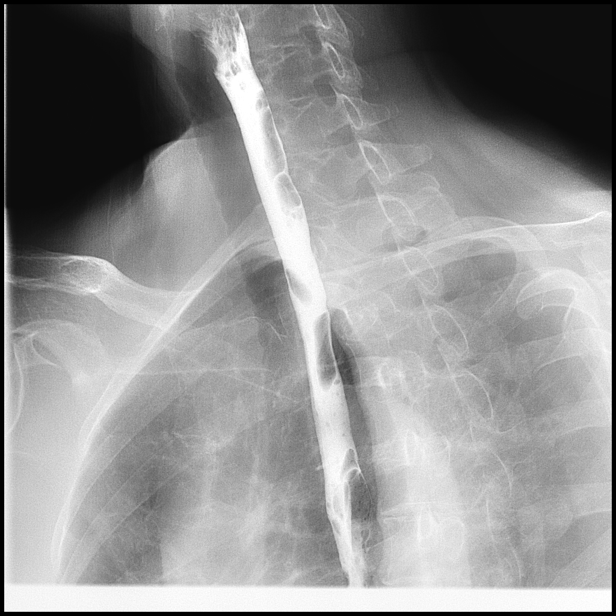
[frame 7/12]
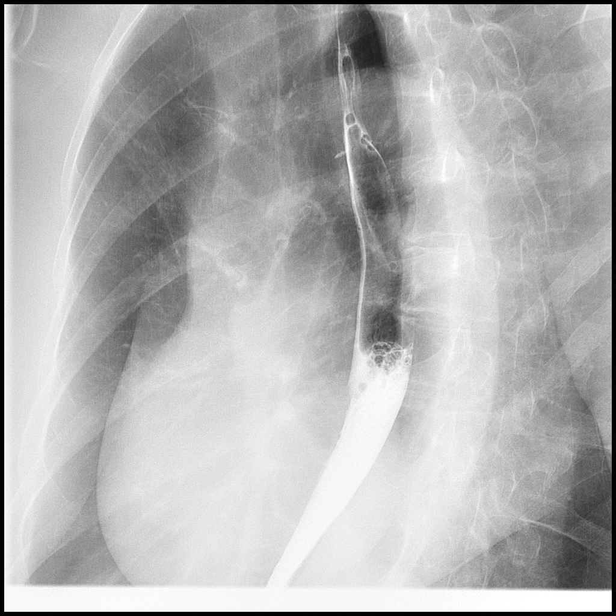
[frame 11/12]
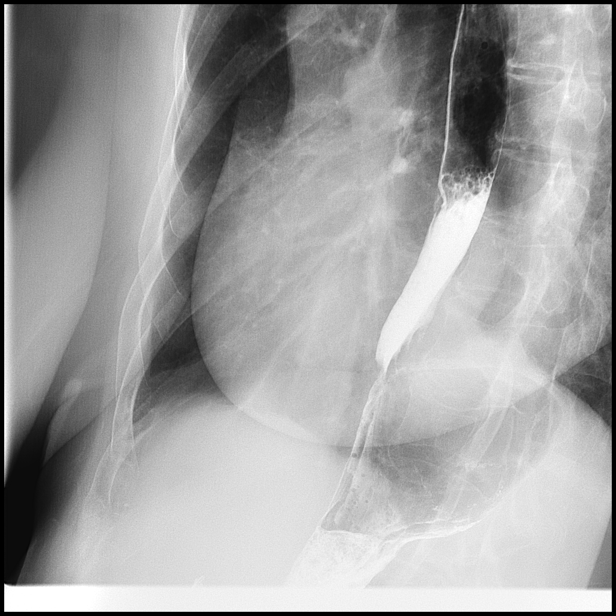

[Series 5: fluoro_barium 2fps_bw · 0.18mm/px · 4 of 11 frames shown (5 of 7)]
[frame 2/11]
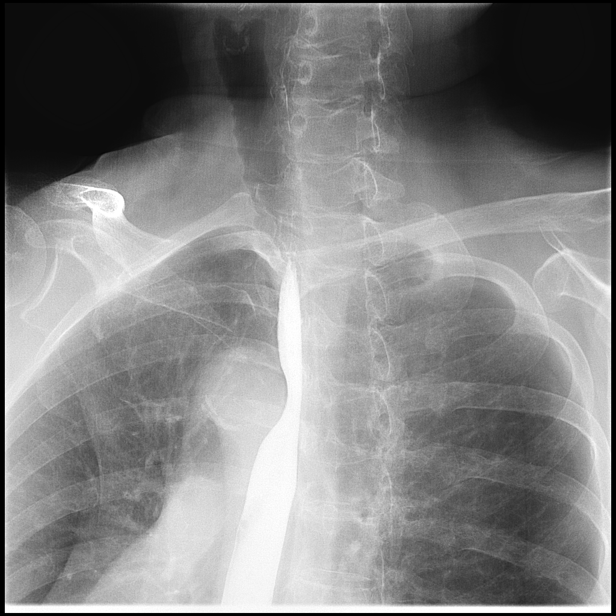
[frame 5/11]
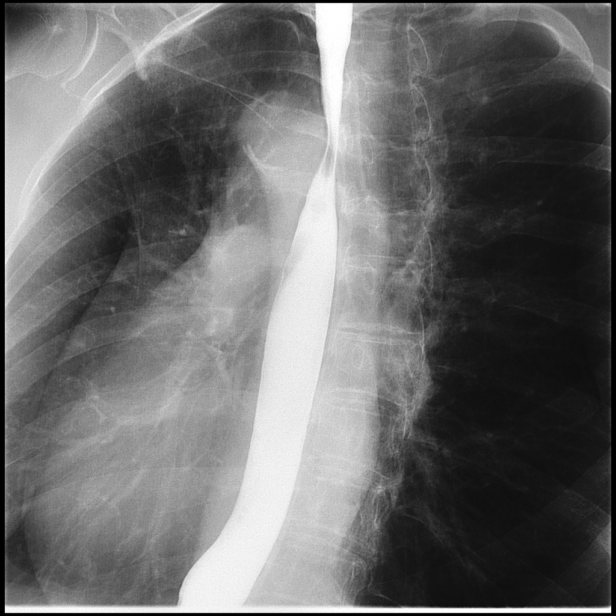
[frame 6/11]
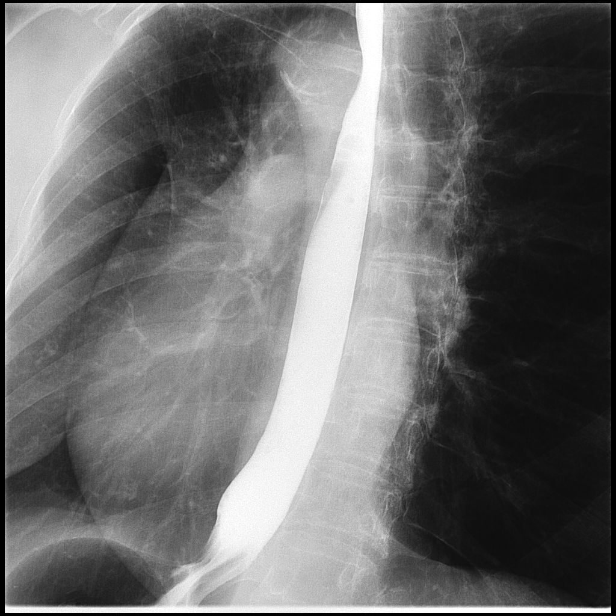
[frame 10/11]
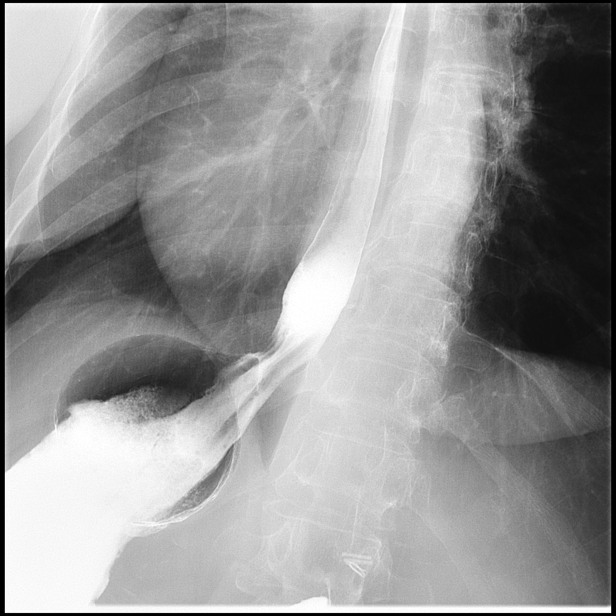

[Series 6: fluoro_barium 2fps_bw · 0.18mm/px · 3 of 20 frames shown (6 of 7)]
[frame 4/20]
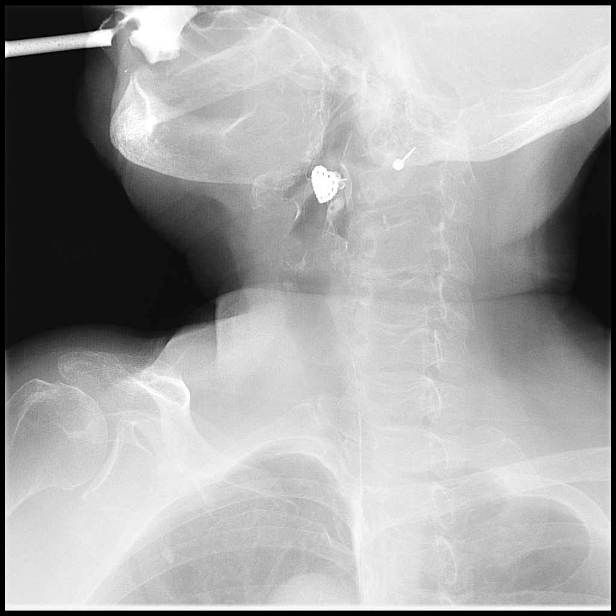
[frame 11/20]
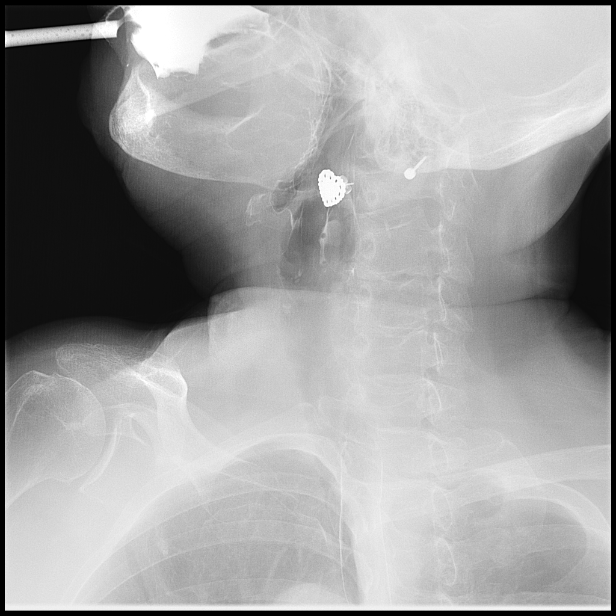
[frame 18/20]
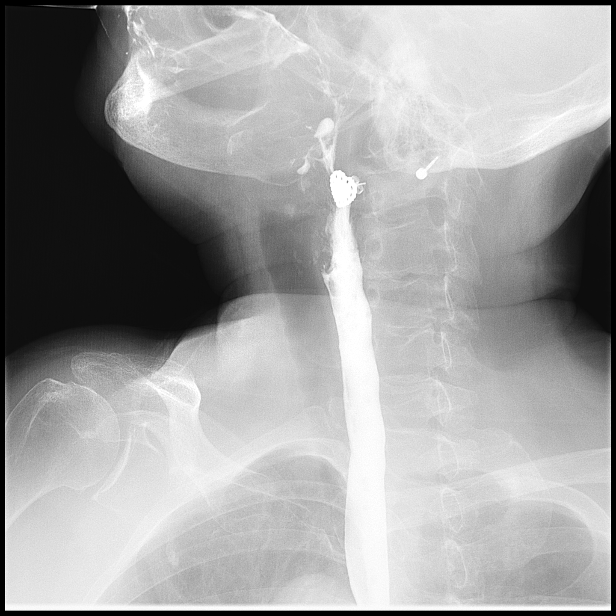

[Series 7: fluoro_barium 2fps_bw · 0.18mm/px · 1 of 1 slices shown (7 of 7)]
[im 1/1]
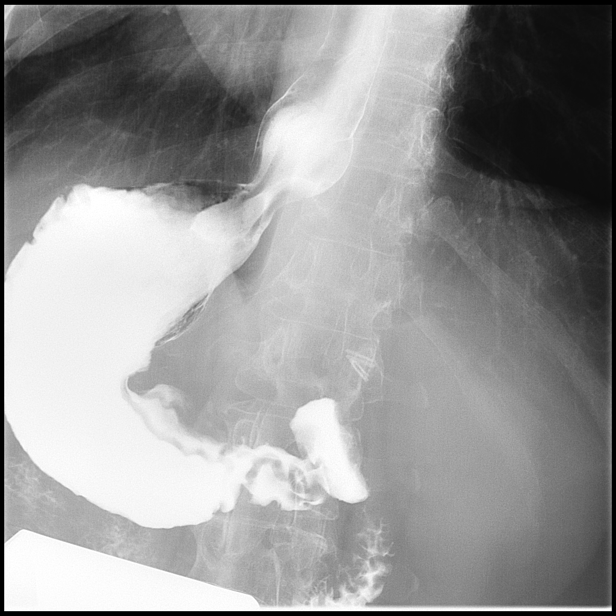

[14 of 14 positions shown; findings below may reference images not displayed]

FINDINGS: The patient was nauseated at the outset of the study. This became
worse during the study in resulted in limitation of the technique.
The cervical esophagus distended well. There was no laryngeal
penetration of the barium. Mild prominence of the cricopharyngeus
muscle impression on the posterior wall of the cervical esophagus
was noted. There are mild degenerative disc changes in the mid and
lower cervical spine. The thoracic esophagus distended well. There
was a small reducible hiatal hernia. No reflux was observed. The
barium tablet passed without difficulty. Survey views of the stomach
were normal.
IMPRESSION: Limited study due to the patient's nausea. Normal esophageal
motility. Small reducible hiatal hernia. No evidence of stricture
nor esophagitis.

## 2016-11-08 ENCOUNTER — Encounter (INDEPENDENT_AMBULATORY_CARE_PROVIDER_SITE_OTHER): Payer: Medicare Other

## 2016-11-08 ENCOUNTER — Ambulatory Visit (INDEPENDENT_AMBULATORY_CARE_PROVIDER_SITE_OTHER): Payer: Medicare Other | Admitting: Vascular Surgery

## 2017-02-04 ENCOUNTER — Encounter: Payer: Self-pay | Admitting: *Deleted

## 2017-02-05 ENCOUNTER — Ambulatory Visit: Payer: Medicare Other | Admitting: Anesthesiology

## 2017-02-05 ENCOUNTER — Ambulatory Visit
Admission: RE | Admit: 2017-02-05 | Discharge: 2017-02-05 | Disposition: A | Discharge status: Home / Self Care | Payer: Medicare Other | Source: Ambulatory Visit | Attending: Gastroenterology | Admitting: Gastroenterology

## 2017-02-05 ENCOUNTER — Encounter
Admission: RE | Disposition: A | Discharge status: Home / Self Care | Payer: Self-pay | Source: Ambulatory Visit | Attending: Gastroenterology

## 2017-02-05 DIAGNOSIS — J449 Chronic obstructive pulmonary disease, unspecified: Secondary | ICD-10-CM | POA: Insufficient documentation

## 2017-02-05 DIAGNOSIS — R112 Nausea with vomiting, unspecified: Secondary | ICD-10-CM | POA: Insufficient documentation

## 2017-02-05 DIAGNOSIS — Z880 Allergy status to penicillin: Secondary | ICD-10-CM | POA: Insufficient documentation

## 2017-02-05 DIAGNOSIS — K449 Diaphragmatic hernia without obstruction or gangrene: Secondary | ICD-10-CM | POA: Insufficient documentation

## 2017-02-05 DIAGNOSIS — I739 Peripheral vascular disease, unspecified: Secondary | ICD-10-CM | POA: Insufficient documentation

## 2017-02-05 DIAGNOSIS — F418 Other specified anxiety disorders: Secondary | ICD-10-CM | POA: Insufficient documentation

## 2017-02-05 DIAGNOSIS — Z87891 Personal history of nicotine dependence: Secondary | ICD-10-CM | POA: Insufficient documentation

## 2017-02-05 DIAGNOSIS — Z7902 Long term (current) use of antithrombotics/antiplatelets: Secondary | ICD-10-CM | POA: Diagnosis not present

## 2017-02-05 DIAGNOSIS — K219 Gastro-esophageal reflux disease without esophagitis: Secondary | ICD-10-CM | POA: Diagnosis not present

## 2017-02-05 DIAGNOSIS — R1013 Epigastric pain: Secondary | ICD-10-CM | POA: Diagnosis present

## 2017-02-05 DIAGNOSIS — K2970 Gastritis, unspecified, without bleeding: Secondary | ICD-10-CM | POA: Insufficient documentation

## 2017-02-05 DIAGNOSIS — Z791 Long term (current) use of non-steroidal anti-inflammatories (NSAID): Secondary | ICD-10-CM | POA: Diagnosis not present

## 2017-02-05 DIAGNOSIS — I10 Essential (primary) hypertension: Secondary | ICD-10-CM | POA: Insufficient documentation

## 2017-02-05 DIAGNOSIS — Z79899 Other long term (current) drug therapy: Secondary | ICD-10-CM | POA: Insufficient documentation

## 2017-02-05 HISTORY — DX: Other forms of dyspnea: R06.09

## 2017-02-05 HISTORY — DX: Other chest pain: R07.89

## 2017-02-05 HISTORY — PX: ESOPHAGOGASTRODUODENOSCOPY (EGD) WITH PROPOFOL: SHX5813

## 2017-02-05 HISTORY — DX: Generalized abdominal pain: R10.84

## 2017-02-05 HISTORY — DX: Dyspnea, unspecified: R06.00

## 2017-02-05 SURGERY — ESOPHAGOGASTRODUODENOSCOPY (EGD) WITH PROPOFOL
Anesthesia: General

## 2017-02-05 MED ORDER — FENTANYL CITRATE (PF) 100 MCG/2ML IJ SOLN
INTRAMUSCULAR | Status: DC | PRN
  Administered 2017-02-05 (×2): 50 ug via INTRAVENOUS

## 2017-02-05 MED ORDER — LIDOCAINE HCL (PF) 1 % IJ SOLN
INTRAMUSCULAR | Status: AC
  Administered 2017-02-05: 0.3 mL via INTRADERMAL
  Filled 2017-02-05: qty 2

## 2017-02-05 MED ORDER — SODIUM CHLORIDE 0.9 % IV SOLN
INTRAVENOUS | Status: DC
  Administered 2017-02-05: 1000 mL via INTRAVENOUS

## 2017-02-05 MED ORDER — PROPOFOL 500 MG/50ML IV EMUL
INTRAVENOUS | Status: DC | PRN
  Administered 2017-02-05: 120 ug/kg/min via INTRAVENOUS

## 2017-02-05 MED ORDER — PHENYLEPHRINE HCL 10 MG/ML IJ SOLN
INTRAMUSCULAR | Status: AC
  Filled 2017-02-05: qty 1

## 2017-02-05 MED ORDER — PROPOFOL 500 MG/50ML IV EMUL
INTRAVENOUS | Status: AC
  Filled 2017-02-05: qty 50

## 2017-02-05 MED ORDER — SODIUM CHLORIDE 0.9 % IV SOLN: INTRAVENOUS | Status: DC

## 2017-02-05 MED ORDER — LIDOCAINE HCL (PF) 2 % IJ SOLN
INTRAMUSCULAR | Status: AC
  Filled 2017-02-05: qty 10

## 2017-02-05 MED ORDER — MIDAZOLAM HCL 2 MG/2ML IJ SOLN
INTRAMUSCULAR | Status: DC | PRN
  Administered 2017-02-05: 2 mg via INTRAVENOUS

## 2017-02-05 MED ORDER — LIDOCAINE HCL (PF) 1 % IJ SOLN
2.0000 mL | Freq: Once | INTRAMUSCULAR | Status: AC
  Administered 2017-02-05: 0.3 mL via INTRADERMAL

## 2017-02-05 MED ORDER — PROPOFOL 10 MG/ML IV BOLUS
INTRAVENOUS | Status: DC | PRN
  Administered 2017-02-05: 30 mg via INTRAVENOUS

## 2017-02-05 MED ORDER — FENTANYL CITRATE (PF) 100 MCG/2ML IJ SOLN
INTRAMUSCULAR | Status: AC
  Filled 2017-02-05: qty 2

## 2017-02-05 MED ORDER — MIDAZOLAM HCL 2 MG/2ML IJ SOLN
INTRAMUSCULAR | Status: AC
  Filled 2017-02-05: qty 2

## 2017-02-05 MED ORDER — PHENYLEPHRINE HCL 10 MG/ML IJ SOLN
INTRAMUSCULAR | Status: DC | PRN
  Administered 2017-02-05: 100 ug via INTRAVENOUS

## 2017-02-05 NOTE — Anesthesia Post-op Follow-up Note (Signed)
Anesthesia QCDR form completed.        

## 2017-02-05 NOTE — Anesthesia Postprocedure Evaluation (Signed)
Anesthesia Post Note  Patient: Shirley Barton  Procedure(s) Performed: ESOPHAGOGASTRODUODENOSCOPY (EGD) WITH PROPOFOL (N/A )  Patient location during evaluation: PACU Anesthesia Type: General Level of consciousness: awake Pain management: pain level controlled Vital Signs Assessment: post-procedure vital signs reviewed and stable Respiratory status: spontaneous breathing Cardiovascular status: stable Anesthetic complications: no     Last Vitals:  Vitals:   02/05/17 1100 02/05/17 1110  BP: (!) 119/104 (!) 132/118  Pulse: 81 82  Resp: 15 15  Temp:    SpO2: 100% 99%    Last Pain:  Vitals:   02/05/17 1040  TempSrc: Tympanic                 VAN STAVEREN,Ceylin Dreibelbis

## 2017-02-05 NOTE — Transfer of Care (Signed)
Immediate Anesthesia Transfer of Care Note  Patient: Shirley Barton  Procedure(s) Performed: ESOPHAGOGASTRODUODENOSCOPY (EGD) WITH PROPOFOL (N/A )  Patient Location: PACU  Anesthesia Type:General  Level of Consciousness: awake  Airway & Oxygen Therapy: Patient Spontanous Breathing and Patient connected to nasal cannula oxygen  Post-op Assessment: Report given to RN and Post -op Vital signs reviewed and stable  Post vital signs: Reviewed  Last Vitals:  Vitals:   02/05/17 0854  BP: (!) 119/95  Pulse: 89  Resp: 17  Temp: (!) 35.6 C  SpO2: 99%    Last Pain:  Vitals:   02/05/17 0854  TempSrc: Tympanic         Complications: No apparent anesthesia complications

## 2017-02-05 NOTE — Anesthesia Preprocedure Evaluation (Signed)
Anesthesia Evaluation  Patient identified by MRN, date of birth, ID band Patient awake    Reviewed: Allergy & Precautions, NPO status , Patient's Chart, lab work & pertinent test results  Airway Mallampati: II       Dental  (+) Upper Dentures, Lower Dentures   Pulmonary COPD,  COPD inhaler, former smoker,     + decreased breath sounds      Cardiovascular Exercise Tolerance: Good hypertension, Pt. on medications + Peripheral Vascular Disease and + DOE   Rhythm:Regular     Neuro/Psych Anxiety Depression    GI/Hepatic Neg liver ROS, GERD  Medicated,  Endo/Other  negative endocrine ROS  Renal/GU negative Renal ROS     Musculoskeletal   Abdominal Normal abdominal exam  (+)   Peds negative pediatric ROS (+)  Hematology negative hematology ROS (+)   Anesthesia Other Findings   Reproductive/Obstetrics                             Anesthesia Physical Anesthesia Plan  ASA: III  Anesthesia Plan: General   Post-op Pain Management:    Induction: Intravenous  PONV Risk Score and Plan: 3 and 0  Airway Management Planned: Natural Airway and Nasal Cannula  Additional Equipment:   Intra-op Plan:   Post-operative Plan:   Informed Consent: I have reviewed the patients History and Physical, chart, labs and discussed the procedure including the risks, benefits and alternatives for the proposed anesthesia with the patient or authorized representative who has indicated his/her understanding and acceptance.     Plan Discussed with: Surgeon  Anesthesia Plan Comments:         Anesthesia Quick Evaluation

## 2017-02-05 NOTE — H&P (Signed)
Outpatient short stay form Pre-procedure 02/05/2017 10:10 AM Lollie Sails MD  Primary Physician: Dr. Ellard Artis Ashkin  Reason for visit:  EGD  History of present illness:  Patient is a 66 year old female presenting today as above. She has been having problems with epigastric discomfort nausea and vomiting. This is a chronic issue for at least 8-9 months. She will vomit undigested foods or bile. She does not regurgitate foods. It have a barium upper GI series it was essentially normal with the exception of a small hiatal hernia. It is of note that there was no evidence of stricture or mass.  Patient does take Plavix daily however is held that for a week.    Current Facility-Administered Medications:  .  0.9 %  sodium chloride infusion, , Intravenous, Continuous, Lollie Sails, MD, Last Rate: 20 mL/hr at 02/05/17 0913, 1,000 mL at 02/05/17 0913 .  0.9 %  sodium chloride infusion, , Intravenous, Continuous, Lollie Sails, MD  Medications Prior to Admission  Medication Sig Dispense Refill Last Dose  . cyanocobalamin 2000 MCG tablet Take 2,000 mcg daily by mouth.     . dicyclomine (BENTYL) 10 MG capsule Take 10 mg 4 (four) times daily -  before meals and at bedtime by mouth.     Marland Kitchen ipratropium (ATROVENT) 0.03 % nasal spray Place 2 sprays every 12 (twelve) hours into both nostrils.     . mometasone (ELOCON) 0.1 % lotion Apply daily topically.     . sucralfate (CARAFATE) 1 g tablet Take 1 g 4 (four) times daily -  with meals and at bedtime by mouth.     Marland Kitchen acetaminophen (TYLENOL) 325 MG tablet Take 650 mg by mouth every 6 (six) hours as needed.   Taking  . clopidogrel (PLAVIX) 75 MG tablet TAKE 1 TABLET BY MOUTH ONCE A DAY 30 tablet 4 01/29/2017  . Fluticasone-Salmeterol (ADVAIR) 100-50 MCG/DOSE AEPB Inhale 1 puff into the lungs 2 (two) times daily.   Taking  . metoCLOPramide (REGLAN) 5 MG tablet Take 1 tablet (5 mg total) by mouth 2 (two) times daily. 30 tablet 0 Taking  . ondansetron  (ZOFRAN ODT) 4 MG disintegrating tablet Take 1 tablet (4 mg total) by mouth every 6 (six) hours as needed for nausea or vomiting. 20 tablet 0 Taking  . pantoprazole (PROTONIX) 40 MG tablet Take by mouth.   Taking     Allergies  Allergen Reactions  . Tramadol   . Cortisone Rash    In 2013  . Cortizone-10 [Hydrocortisone] Rash  . Penicillins Rash     Past Medical History:  Diagnosis Date  . Abdominal pain, generalized   . Anxiety   . Atypical chest pain   . Back pain   . Basal cell carcinoma of skin 2013   resected from Left scalp area.   Marland Kitchen COPD (chronic obstructive pulmonary disease) ([place])   . Depression   . DOE (dyspnea on exertion)   . GERD (gastroesophageal reflux disease)   . Hypertension   . Shortness of breath dyspnea     Review of systems:      Physical Exam    Heart and lungs: Regular rate and rhythm without rub or gallop, lungs are bilaterally clear.    HEENT: Normocephalic atraumatic eyes are anicteric    Other:     Pertinant exam for procedure: Soft mild tenderness to palpation the epigastric region. There are no masses or rebound. Bowel sounds are positive normoactive    Planned proceedures: EGD and  indicated procedures. I have discussed the risks benefits and complications of procedures to include not limited to bleeding, infection, perforation and the risk of sedation and the patient wishes to proceed.    Lollie Sails, MD Gastroenterology 02/05/2017  10:10 AM

## 2017-02-05 NOTE — Op Note (Signed)
[facility] Gastroenterology Patient Name: Shirley Barton Procedure Date: 02/05/2017 9:59 AM MRN: [id number] Account #: [id number] Date of Birth: 09/23/1950 Admit Type: Outpatient Age: 66 Room: [facility] ENDO ROOM 3 Gender: Female Note Status: Finalized Procedure:            Upper GI endoscopy Indications:          Epigastric abdominal pain, Nausea with vomiting Providers:            Lollie Sails, MD Referring MD:         Neldon Labella. Ashkin MD (Referring MD) Medicines:            Monitored Anesthesia Care Complications:        No immediate complications. Procedure:            Pre-Anesthesia Assessment:                       - ASA Grade Assessment: III - A patient with severe                        systemic disease.                       After obtaining informed consent, the endoscope was                        passed under direct vision. Throughout the procedure,                        the patient's blood pressure, pulse, and oxygen                        saturations were monitored continuously. The Endoscope                        was introduced through the and advanced to the third                        part of duodenum. The upper GI endoscopy was                        accomplished without difficulty. The patient tolerated                        the procedure well. Findings:      The Z-line was variable and was found 41 cm from the incisors. Biopsies       were taken with a cold forceps for histology.      A small hiatal hernia was found. The Z-line was a variable distance from       incisors; the hiatal hernia was sliding. Biopsies were taken with a cold       forceps for histology.      Diffuse and patchy moderate inflammation characterized by adherent       blood, congestion (edema), erosions, erythema and friability was found       in the gastric body and in the gastric antrum. Biopsies were taken with       a cold forceps for histology. Biopsies were taken  with a cold forceps       for Helicobacter pylori testing.      The examined duodenum was normal.  The cardia and gastric fundus were normal on retroflexion otherwise. Impression:           - Z-line variable, 41 cm from the incisors. Biopsied.                       - Small hiatal hernia. Biopsied.                       - Erosive gastritis. Biopsied.                       - Normal examined duodenum. Recommendation:       - Discharge patient to home.                       - Use Protonix (pantoprazole) 40 mg PO BID for 1 month.                       - Use Protonix (pantoprazole) 40 mg PO daily.                       - Use metoclopramide (Reglan) 5 mg PO QID; 30 min AC                        and HS.                       - Return to GI clinic in 1 month. Procedure Code(s):    --- Professional ---                       [phone], Esophagogastroduodenoscopy, flexible, transoral;                        with biopsy, single or multiple Diagnosis Code(s):    --- Professional ---                       K22.8, Other specified diseases of esophagus                       K44.9, Diaphragmatic hernia without obstruction or                        gangrene                       K29.60, Other gastritis without bleeding                       R10.13, Epigastric pain                       R11.2, Nausea with vomiting, unspecified CPT copyright 2016 American Medical Association. All rights reserved. The codes documented in this report are preliminary and upon coder review may  be revised to meet current compliance requirements. Lollie Sails, MD 02/05/2017 10:49:10 AM This report has been signed electronically. Number of Addenda: 0 Note Initiated On: 02/05/2017 9:59 AM      [facility]

## 2017-02-06 ENCOUNTER — Encounter: Payer: Self-pay | Admitting: Gastroenterology

## 2017-02-06 LAB — SURGICAL PATHOLOGY

## 2017-04-06 ENCOUNTER — Emergency Department
Admission: EM | Admit: 2017-04-06 | Discharge: 2017-04-06 | Disposition: A | Discharge status: Home / Self Care | Payer: Commercial Managed Care - HMO | Attending: Student in an Organized Health Care Education/Training Program | Admitting: Student in an Organized Health Care Education/Training Program

## 2017-04-06 ENCOUNTER — Encounter: Payer: Self-pay | Admitting: Emergency Medicine

## 2017-04-06 ENCOUNTER — Emergency Department: Payer: Commercial Managed Care - HMO

## 2017-04-06 DIAGNOSIS — Z951 Presence of aortocoronary bypass graft: Secondary | ICD-10-CM | POA: Diagnosis not present

## 2017-04-06 DIAGNOSIS — I10 Essential (primary) hypertension: Secondary | ICD-10-CM | POA: Diagnosis not present

## 2017-04-06 DIAGNOSIS — N760 Acute vaginitis: Secondary | ICD-10-CM | POA: Insufficient documentation

## 2017-04-06 DIAGNOSIS — Z7902 Long term (current) use of antithrombotics/antiplatelets: Secondary | ICD-10-CM | POA: Diagnosis not present

## 2017-04-06 DIAGNOSIS — J449 Chronic obstructive pulmonary disease, unspecified: Secondary | ICD-10-CM | POA: Insufficient documentation

## 2017-04-06 DIAGNOSIS — Z79899 Other long term (current) drug therapy: Secondary | ICD-10-CM | POA: Diagnosis not present

## 2017-04-06 DIAGNOSIS — Z87891 Personal history of nicotine dependence: Secondary | ICD-10-CM | POA: Insufficient documentation

## 2017-04-06 DIAGNOSIS — Z85828 Personal history of other malignant neoplasm of skin: Secondary | ICD-10-CM | POA: Insufficient documentation

## 2017-04-06 DIAGNOSIS — Z880 Allergy status to penicillin: Secondary | ICD-10-CM | POA: Diagnosis not present

## 2017-04-06 DIAGNOSIS — R1031 Right lower quadrant pain: Secondary | ICD-10-CM | POA: Diagnosis present

## 2017-04-06 DIAGNOSIS — B9689 Other specified bacterial agents as the cause of diseases classified elsewhere: Secondary | ICD-10-CM | POA: Diagnosis not present

## 2017-04-06 LAB — COMPREHENSIVE METABOLIC PANEL
ALT: 11 U/L — ABNORMAL LOW (ref 14–54)
ANION GAP: 8 (ref 5–15)
AST: 21 U/L (ref 15–41)
Albumin: 4.8 g/dL (ref 3.5–5.0)
Alkaline Phosphatase: 66 U/L (ref 38–126)
BILIRUBIN TOTAL: 1.4 mg/dL — AB (ref 0.3–1.2)
BUN: 7 mg/dL (ref 6–20)
CO2: 29 mmol/L (ref 22–32)
Calcium: 9.5 mg/dL (ref 8.9–10.3)
Chloride: 103 mmol/L (ref 101–111)
Creatinine, Ser: 0.71 mg/dL (ref 0.44–1.00)
GFR calc Af Amer: >60 mL/min (ref >60)
Glucose, Bld: 86 mg/dL (ref 65–99)
Potassium: 4.4 mmol/L (ref 3.5–5.1)
Sodium: 140 mmol/L (ref 135–145)
TOTAL PROTEIN: 8.2 g/dL — AB (ref 6.5–8.1)

## 2017-04-06 LAB — URINALYSIS, COMPLETE (UACMP) WITH MICROSCOPIC
BACTERIA UA: NONE SEEN
BILIRUBIN URINE: NEGATIVE
GLUCOSE, UA: NEGATIVE mg/dL
HGB URINE DIPSTICK: NEGATIVE
Ketones, ur: NEGATIVE mg/dL
LEUKOCYTES UA: NEGATIVE
NITRITE: NEGATIVE
Protein, ur: NEGATIVE mg/dL
pH: 6 (ref 5.0–8.0)

## 2017-04-06 LAB — LIPASE, BLOOD: Lipase: 24 U/L (ref 11–51)

## 2017-04-06 LAB — CBC
HEMATOCRIT: 48.1 % — AB (ref 35.0–47.0)
HEMOGLOBIN: 16.1 g/dL — AB (ref 12.0–16.0)
MCH: 29.9 pg (ref 26.0–34.0)
MCHC: 33.4 g/dL (ref 32.0–36.0)
MCV: 89.6 fL (ref 80.0–100.0)
Platelets: 181 10*3/uL (ref 150–440)
RBC: 5.37 MIL/uL — ABNORMAL HIGH (ref 3.80–5.20)
RDW: 13.4 % (ref 11.5–14.5)
WBC: 9 10*3/uL (ref 3.6–11.0)

## 2017-04-06 LAB — WET PREP, GENITAL
SPERM: NONE SEEN
Trich, Wet Prep: NONE SEEN
Yeast Wet Prep HPF POC: NONE SEEN

## 2017-04-06 LAB — CHLAMYDIA/NGC RT PCR (ARMC ONLY)
Chlamydia Tr: NOT DETECTED
N gonorrhoeae: NOT DETECTED

## 2017-04-06 IMAGING — CT CT ABD-PELV W/ CM
2 of 5 series · 16 of 46 positions shown, 18 images · IV contrast (iopamidol)
Comparison: [DATE]

CLINICAL DATA: Pt reports RLQ pain since yesterday, was seen [REDACTED]
and sent here for appendicitis eval. Pt reports nausea, denies
vomiting and diarrhea. No oral contrast per ULBIO MANUEL.

EXAM:
CT ABDOMEN AND PELVIS WITH CONTRAST
TECHNIQUE: Multidetector CT imaging of the abdomen and pelvis was performed
using the standard protocol following bolus administration of
intravenous contrast.
CONTRAST:  75mL [QE] IOPAMIDOL ([QE]) INJECTION 61%

[Series 2: routine abd/pel with · axial · 0.66mm/px · z∈[-479,-129]mm · 13 of 80 slices shown, 15 images]
[im 5/80  soft-tissue]
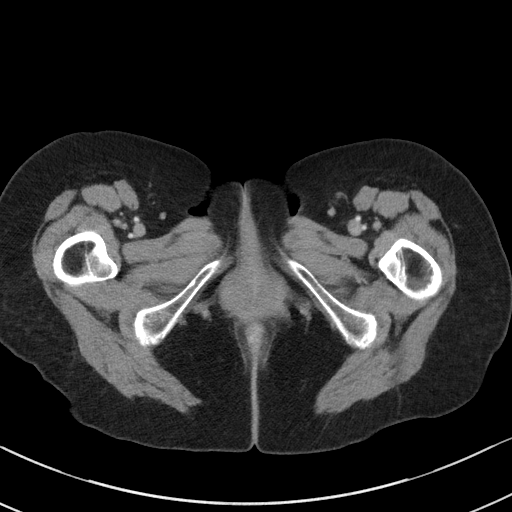
[im 5/80  bone]
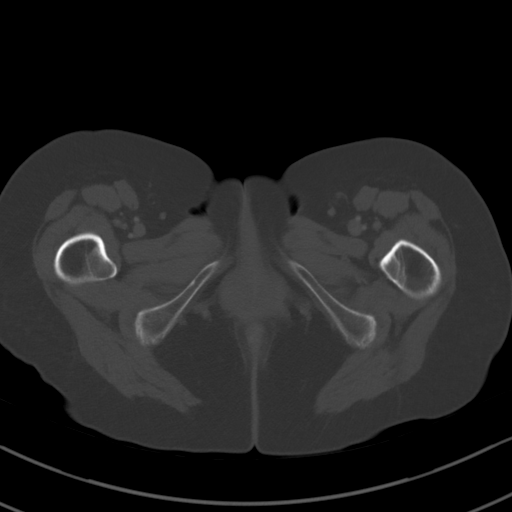
[im 13/80  soft-tissue]
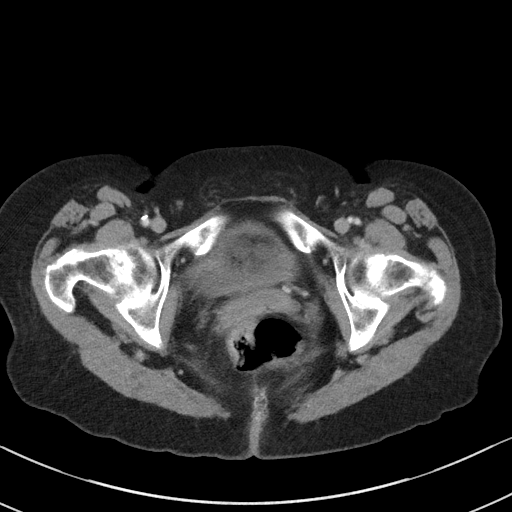
[im 17/80  soft-tissue]
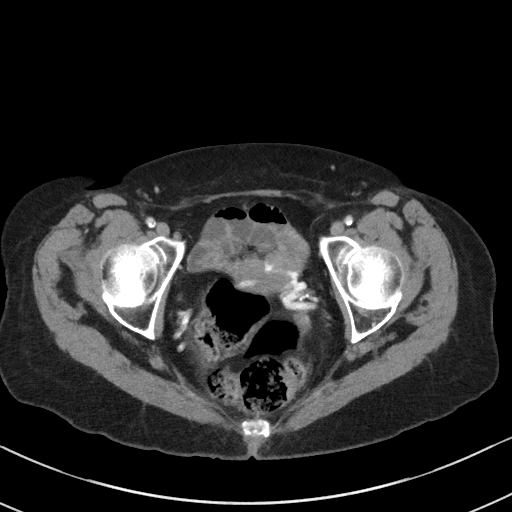
[im 21/80  soft-tissue]
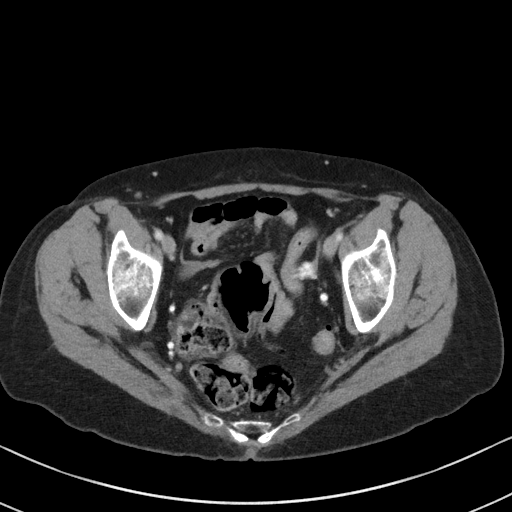
[im 30/80  soft-tissue]
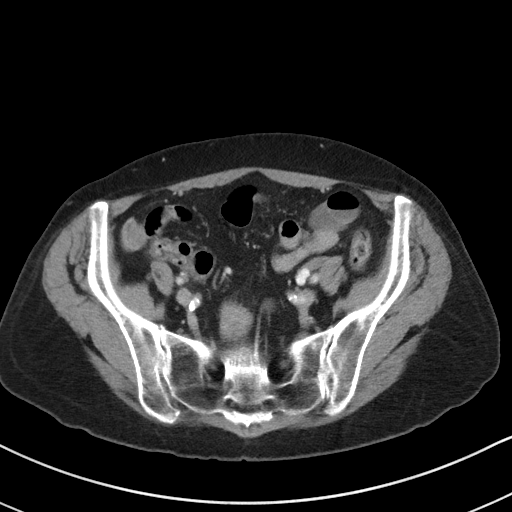
[im 34/80  soft-tissue]
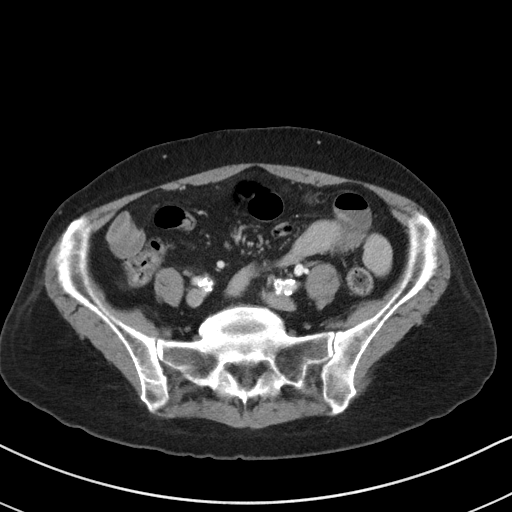
[im 42/80  soft-tissue]
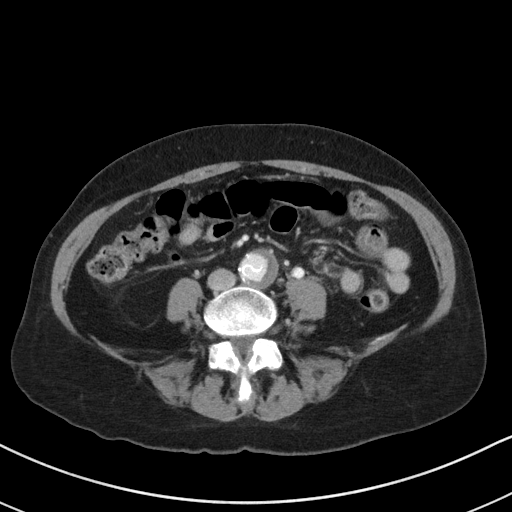
[im 46/80  soft-tissue]
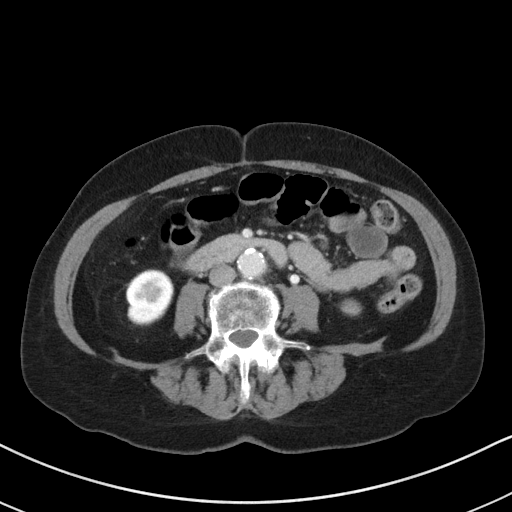
[im 50/80  soft-tissue]
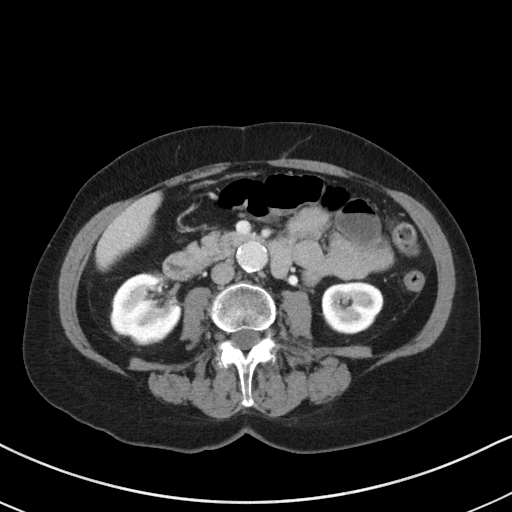
[im 50/80  bone]
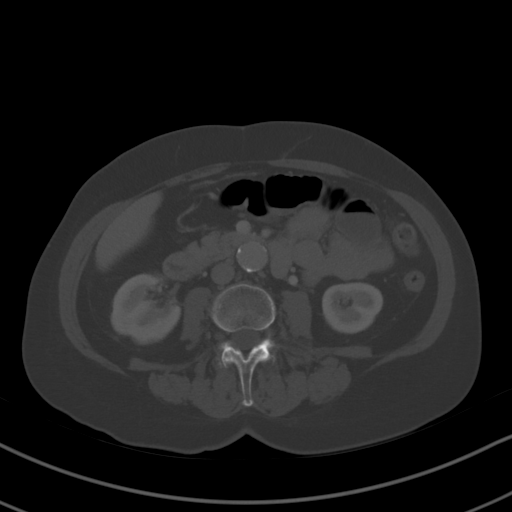
[im 59/80  soft-tissue]
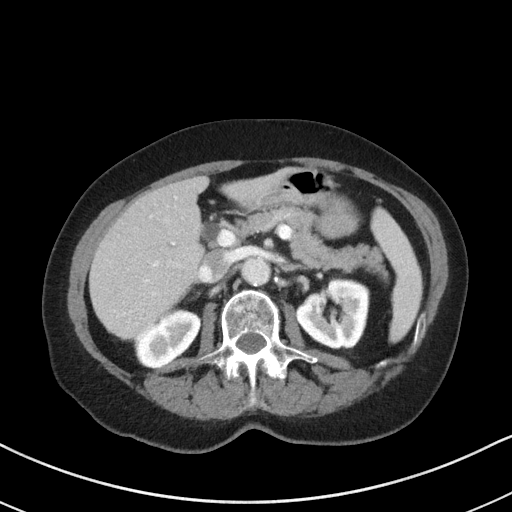
[im 63/80  soft-tissue]
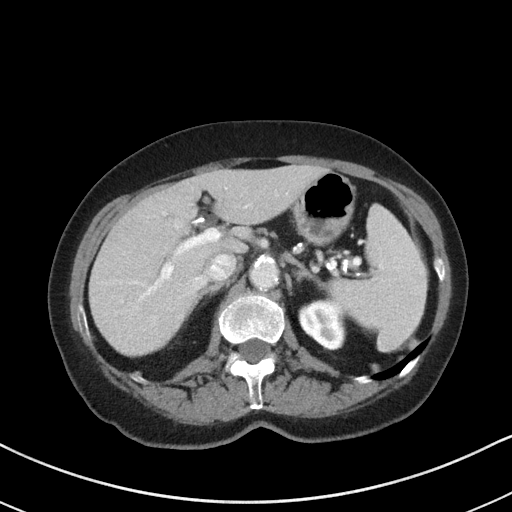
[im 67/80  soft-tissue]
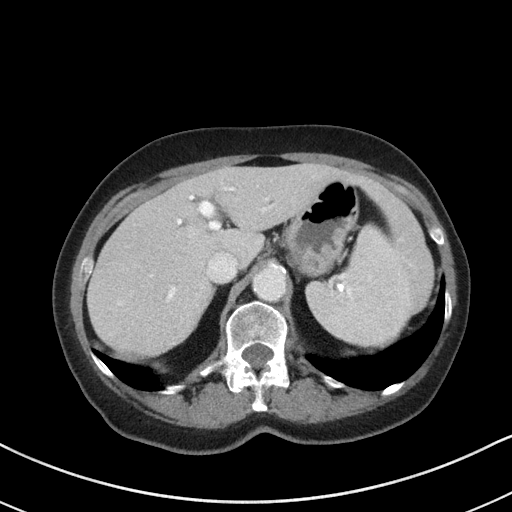
[im 75/80  soft-tissue]
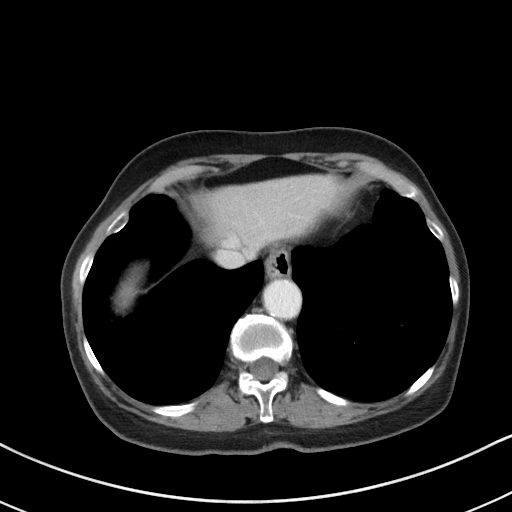

[Series 5: coronal st · coronal · 0.62mm/px · 3 of 72 slices shown]
[im 24/72  soft-tissue]
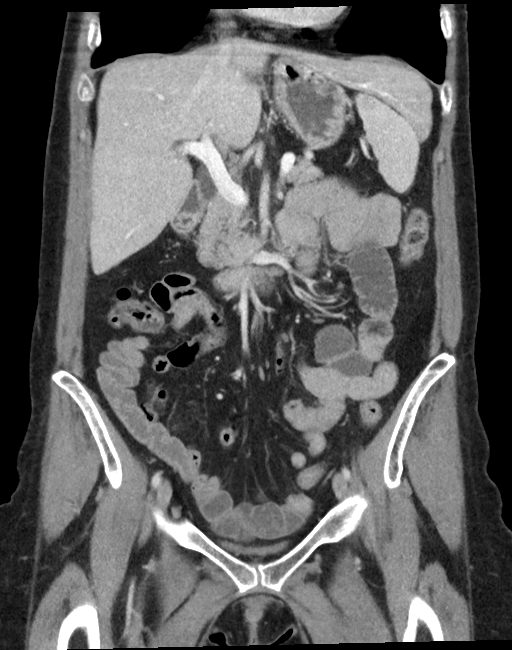
[im 32/72  soft-tissue]
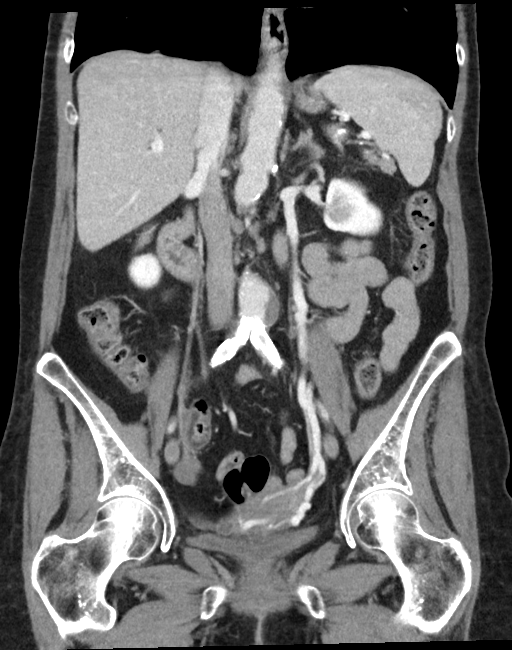
[im 40/72  soft-tissue]
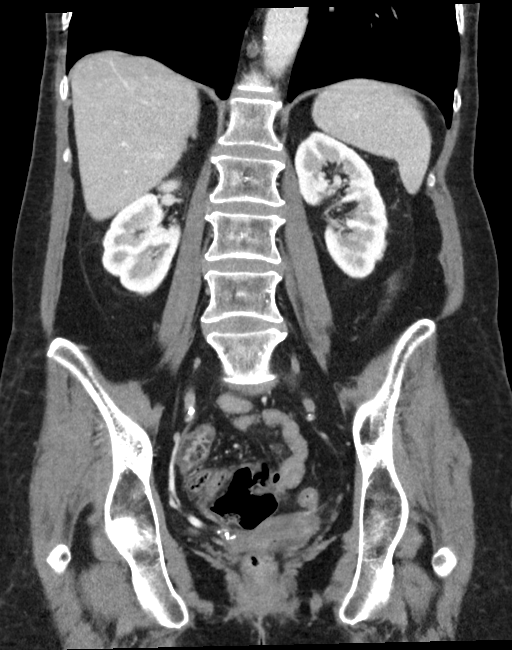

[16 of 46 positions shown; findings below may reference images not displayed]

FINDINGS: Lower chest: No acute abnormality.

Hepatobiliary: Status post cholecystectomy. Mild intra and
extrahepatic biliary duct dilatation. No focal liver lesions.

Pancreas: Unremarkable. No pancreatic ductal dilatation or
surrounding inflammatory changes.

Spleen: Normal in size without focal abnormality.

Adrenals/Urinary Tract: Adrenal glands are normal.

Stomach/Bowel: The stomach and small bowel loops are normal in
appearance. The appendix is well seen and has a normal appearance.
There scattered colonic diverticula but no acute diverticulitis.
Moderate stool burden.

Vascular/Lymphatic: There is dense atherosclerotic calcification of
the abdominal aorta. Moderate mural thrombus. Just above the iliac
bifurcation there is a focal dilatation of the aorta, measuring
cm, previously 2.3 cm. No retroperitoneal or mesenteric adenopathy.

Reproductive: The uterus is present.  No adnexal mass.

Other: No abdominal wall hernia or abnormality. No abdominopelvic
ascites.

Musculoskeletal: No acute or significant osseous findings.
IMPRESSION: 1.  No evidence for acute  abnormality.
2. Normal appendix.
3. Status post cholecystectomy.
4. 2.6 cm infrarenal abdominal aorta. Ectatic abdominal aorta at
risk for aneurysm development. Recommend followup by ultrasound in 5
years. This recommendation follows ACR consensus guidelines: White
Paper of the ACR Incidental Findings Committee II on Vascular
Findings. [HOSPITAL] [QE]; [DATE].
5. Diverticulosis without acute diverticulitis. Moderate stool
burden.

## 2017-04-06 MED ORDER — ONDANSETRON 4 MG PO TBDP
4.0000 mg | ORAL_TABLET | Freq: Once | ORAL | Status: AC | PRN
  Administered 2017-04-06: 4 mg via ORAL
  Filled 2017-04-06: qty 1

## 2017-04-06 MED ORDER — IOPAMIDOL (ISOVUE-300) INJECTION 61%
100.0000 mL | Freq: Once | INTRAVENOUS | Status: AC | PRN
  Administered 2017-04-06: 75 mL via INTRAVENOUS
  Filled 2017-04-06: qty 100

## 2017-04-06 MED ORDER — OXYCODONE-ACETAMINOPHEN 5-325 MG PO TABS
ORAL_TABLET | ORAL | Status: AC
  Filled 2017-04-06: qty 1

## 2017-04-06 MED ORDER — OXYCODONE-ACETAMINOPHEN 5-325 MG PO TABS
1.0000 | ORAL_TABLET | ORAL | Status: DC | PRN
  Administered 2017-04-06: 1 via ORAL

## 2017-04-06 MED ORDER — METRONIDAZOLE 500 MG PO TABS: 500.0000 mg | ORAL_TABLET | Freq: Two times a day (BID) | ORAL | 0 refills | Status: AC

## 2017-04-06 MED ORDER — PROMETHAZINE HCL 12.5 MG PO TABS: 12.5000 mg | ORAL_TABLET | Freq: Four times a day (QID) | ORAL | 0 refills | Status: DC | PRN

## 2017-04-06 MED ORDER — PROMETHAZINE HCL 25 MG/ML IJ SOLN
12.5000 mg | Freq: Four times a day (QID) | INTRAMUSCULAR | Status: DC | PRN
  Administered 2017-04-06: 12.5 mg via INTRAVENOUS
  Filled 2017-04-06: qty 1

## 2017-04-06 NOTE — ED Notes (Signed)
UA sent at this time -

## 2017-04-06 NOTE — Discharge Instructions (Addendum)
°  IMPRESSION:  1.  No evidence for acute  abnormality.  2. Normal appendix.  3. Status post cholecystectomy.  4. 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  risk for aneurysm development. Recommend followup by ultrasound in 5  years. This recommendation follows ACR consensus guidelines: White  Paper of the ACR Incidental Findings Committee II on Vascular  Findings. J Am Coll Radiol 2013; 10:789-794.  5. Diverticulosis without acute diverticulitis. Moderate stool  Burden.     You have been seen in the emergency department for emergency care. It is important that you contact your own doctor, specialist or the closest clinic for follow-up care. Please bring this instruction sheet, all medications and X-ray copies with you when you are seen for follow-up care.  Determining the exact cause for all patients with abdominal pain is extremely difficult in the emergency department. Our primary focus is to rule-out immediate life-threatening diseases. If no immediate source of pain is found the definitive diagnosis frequently needs to be determined over time.Many times your primary care physician can determine the cause by following the symptoms over time. Sometimes, specialist are required such as Gastroenterologists, Gynecologists, Urologists or Surgeons. Please return immediately to the Emergency Department for fever>101, Vomiting or Intractable Pain. You should return to the emergency department or see your primary care provider in 12-24hrs if your pain is no better and sooner if your pain becomes worse.

## 2017-04-06 NOTE — ED Notes (Signed)
Patient transported to CT 

## 2017-04-06 NOTE — ED Triage Notes (Signed)
Pt reports RLQ pain since yesterday, was seen at [facility] and sent here for appendicitis eval. Pt reports nausea, denies vomiting and diarrhea.

## 2017-04-06 NOTE — ED Provider Notes (Signed)
[facility] Emergency Department Provider Note    First MD Initiated Contact with Patient 04/06/17 1516     (approximate)  I have reviewed the triage vital signs and the nursing notes.   HISTORY  Chief Complaint Abdominal Pain    HPI Shirley Barton is a 67 y.o. female lower quadrant abdominal pain since yesterday.  Patient was seen in urgent care today.  Does still have her appendix.  Denies any fevers nausea or vomiting.  States that she has had vaginal discharge but is not sexually active.  States she has had history of bacterial vaginosis states that this feels similar she has had pain radiating to her back.  Denies any trauma.  No diarrhea.  No blood in her stool.  No dysuria or flank pain.  No chest pain or shortness of breath.  Pain currently is mild to moderate without radiation.  Past Medical History:  Diagnosis Date  . Abdominal pain, generalized   . Anxiety   . Atypical chest pain   . Back pain   . Basal cell carcinoma of skin 2013   resected from Left scalp area.   Marland Kitchen COPD (chronic obstructive pulmonary disease) ([place])   . Depression   . DOE (dyspnea on exertion)   . GERD (gastroesophageal reflux disease)   . Hypertension   . Shortness of breath dyspnea    Family History  Problem Relation Age of Onset  . Colon polyps Sister   . Cerebral palsy Daughter    Past Surgical History:  Procedure Laterality Date  . BASAL CELL CARCINOMA EXCISION  2013  . CARPAL TUNNEL RELEASE Left 90s  . CHOLECYSTECTOMY N/A 09/16/2015   Procedure: LAPAROSCOPIC CHOLECYSTECTOMY WITH INTRAOPERATIVE CHOLANGIOGRAM;  Surgeon: Robert Bellow, MD;  Location: [facility] ORS;  Service: General;  Laterality: N/A;  . COLONOSCOPY  8341"D   [place]  . CORONARY ARTERY BYPASS GRAFT    . ESOPHAGOGASTRODUODENOSCOPY (EGD) WITH PROPOFOL N/A 02/05/2017   Procedure: ESOPHAGOGASTRODUODENOSCOPY (EGD) WITH PROPOFOL;  Surgeon: Lollie Sails, MD;  Location: [facility] ENDOSCOPY;  Service:  Endoscopy;  Laterality: N/A;  . PERIPHERAL VASCULAR CATHETERIZATION Bilateral 12/27/2015   Procedure: Lower Extremity Angiography;  Surgeon: Katha Cabal, MD;  Location: [place] CV LAB;  Service: Cardiovascular;  Laterality: Bilateral;  . TONSILLECTOMY     Patient Active Problem List   Diagnosis Date Noted  . PAD (peripheral artery disease) ([place]) 01/25/2016  . Iliac artery stenosis, bilateral ([place]) 01/25/2016  . Atherosclerosis of native artery of both lower extremities with intermittent claudication ([place]) 01/25/2016  . Generalized abdominal pain 10/06/2015      Prior to Admission medications   Medication Sig Start Date End Date Taking? Authorizing Provider  acetaminophen (TYLENOL) 325 MG tablet Take 650 mg by mouth every 6 (six) hours as needed.    [provider]  clopidogrel (PLAVIX) 75 MG tablet TAKE 1 TABLET BY MOUTH ONCE A DAY 07/26/16   Schnier, Dolores Lory, MD  cyanocobalamin 2000 MCG tablet Take 2,000 mcg daily by mouth.    [provider]  dicyclomine (BENTYL) 10 MG capsule Take 10 mg 4 (four) times daily -  before meals and at bedtime by mouth.    [provider]  Fluticasone-Salmeterol (ADVAIR) 100-50 MCG/DOSE AEPB Inhale 1 puff into the lungs 2 (two) times daily.    [provider]  ipratropium (ATROVENT) 0.03 % nasal spray Place 2 sprays every 12 (twelve) hours into both nostrils.    [provider]  metoCLOPramide (  REGLAN) 5 MG tablet Take 1 tablet (5 mg total) by mouth 2 (two) times daily. 10/06/15   Robert Bellow, MD  mometasone (ELOCON) 0.1 % lotion Apply daily topically.    [provider]  ondansetron (ZOFRAN ODT) 4 MG disintegrating tablet Take 1 tablet (4 mg total) by mouth every 6 (six) hours as needed for nausea or vomiting. 08/28/15   Delman Kitten, MD  pantoprazole (PROTONIX) 40 MG tablet Take by mouth. 09/07/15   [provider]  sucralfate (CARAFATE) 1 g tablet Take 1 g 4 (four) times daily -   with meals and at bedtime by mouth.    [provider]    Allergies Tramadol; Cortisone; Cortizone-10 [hydrocortisone]; and Penicillins    Social History Social History   Tobacco Use  . Smoking status: Former Smoker    Last attempt to quit: 03/02/2012    Years since quitting: 5.0  . Smokeless tobacco: Never Used  Substance Use Topics  . Alcohol use: No    Alcohol/week: 0.0 oz  . Drug use: No    Review of Systems Patient denies headaches, rhinorrhea, blurry vision, numbness, shortness of breath, chest pain, edema, cough, abdominal pain, nausea, vomiting, diarrhea, dysuria, fevers, rashes or hallucinations unless otherwise stated above in HPI. ____________________________________________   PHYSICAL EXAM:  VITAL SIGNS: Vitals:   04/06/17 1416 04/06/17 1418  BP:  (!) 145/85  Pulse: 91   Resp: 16   Temp: 98.1 F (36.7 C)   SpO2: 97%     Constitutional: Alert and oriented. Well appearing and in no acute distress. Eyes: Conjunctivae are normal.  Head: Atraumatic. Nose: No congestion/rhinnorhea. Mouth/Throat: Mucous membranes are moist.   Neck: No stridor. Painless ROM.  Cardiovascular: Normal rate, regular rhythm. Grossly normal heart sounds.  Good peripheral circulation. Respiratory: Normal respiratory effort.  No retractions. Lungs CTAB. Gastrointestinal: Soft with mild right lower quadrant tenderness to palpation no distention. No abdominal bruits. No CVA tenderness. Genitourinary: deferred Musculoskeletal: No lower extremity tenderness nor edema.  No joint effusions. Neurologic:  Normal speech and language. No gross focal neurologic deficits are appreciated. No facial droop Skin:  Skin is warm, dry and intact. No rash noted. Psychiatric: Mood and affect are normal. Speech and behavior are normal.  ____________________________________________   LABS (all labs ordered are listed, but only abnormal results are displayed)  Results for orders placed or  performed during the hospital encounter of 04/06/17 (from the past 24 hour(s))  Lipase, blood     Status: None   Collection Time: 04/06/17  2:14 PM  Result Value Ref Range   Lipase 24 11 - 51 U/L  Comprehensive metabolic panel     Status: Abnormal   Collection Time: 04/06/17  2:14 PM  Result Value Ref Range   Sodium 140 135 - 145 mmol/L   Potassium 4.4 3.5 - 5.1 mmol/L   Chloride 103 101 - 111 mmol/L   CO2 29 22 - 32 mmol/L   Glucose, Bld 86 65 - 99 mg/dL   BUN 7 6 - 20 mg/dL   Creatinine, Ser 0.71 0.44 - 1.00 mg/dL   Calcium 9.5 8.9 - 10.3 mg/dL   Total Protein 8.2 (H) 6.5 - 8.1 g/dL   Albumin 4.8 3.5 - 5.0 g/dL   AST 21 15 - 41 U/L   ALT 11 (L) 14 - 54 U/L   Alkaline Phosphatase 66 38 - 126 U/L   Total Bilirubin 1.4 (H) 0.3 - 1.2 mg/dL   GFR calc non Af Amer >  60 >60 mL/min   GFR calc Af Amer >60 >60 mL/min   Anion gap 8 5 - 15  CBC     Status: Abnormal   Collection Time: 04/06/17  2:14 PM  Result Value Ref Range   WBC 9.0 3.6 - 11.0 K/uL   RBC 5.37 (H) 3.80 - 5.20 MIL/uL   Hemoglobin 16.1 (H) 12.0 - 16.0 g/dL   HCT 48.1 (H) 35.0 - 47.0 %   MCV 89.6 80.0 - 100.0 fL   MCH 29.9 26.0 - 34.0 pg   MCHC 33.4 32.0 - 36.0 g/dL   RDW 13.4 11.5 - 14.5 %   Platelets 181 150 - 440 K/uL   ____________________________________________ ____________________________________________  RADIOLOGY  I personally reviewed all radiographic images ordered to evaluate for the above acute complaints and reviewed radiology reports and findings.  These findings were personally discussed with the patient.  Please see medical record for radiology report.  ____________________________________________   PROCEDURES  Procedure(s) performed:  Procedures    Critical Care performed: no ____________________________________________   INITIAL IMPRESSION / ASSESSMENT AND PLAN / ED COURSE  Pertinent labs & imaging results that were available during my care of the patient were reviewed by me and  considered in my medical decision making (see chart for details).  DDX: Appendicitis, UTI, pyelonephritis, stone, colitis, TOA, BV or PID, muscular skeletal strain  LINSAY VOGT is a 67 y.o. who presents to the ED with described above.  Patient's afebrile hemodynamic stable.  Blood work is reassuring.  CT imaging ordered due to concern for right lower quadrant pain concern for appendicitis shows no evidence of acute appendicitis.  No evidence of abscess, mass or colitis.  No evidence of stone.  Urinalysis shows no evidence of infection.  Regarding her history of BV.  Recommended ordering swabs.  I have offered pelvic exam but patient has declined this.  Patient will be started on Flagyl for evidence of bacterial vaginosis.  Have discussed with the patient and available family all diagnostics and treatments performed thus far and all questions were answered to the best of my ability. The patient demonstrates understanding and agreement with plan.       ____________________________________________   FINAL CLINICAL IMPRESSION(S) / ED DIAGNOSES  Final diagnoses:  Right lower quadrant abdominal pain  Bacterial vaginosis      NEW MEDICATIONS STARTED DURING THIS VISIT:  This SmartLink is deprecated. Use AVSMEDLIST instead to display the medication list for a patient.   Note:  This document was prepared using Dragon voice recognition software and may include unintentional dictation errors.    Merlyn Lot, MD 04/06/17 [phone]

## 2017-04-06 NOTE — ED Notes (Addendum)
Pt still unable to provide urine specimen 

## 2017-04-06 NOTE — ED Triage Notes (Signed)
FIRST NURSE NOTE-abdominal pain sent for r/o appendicitis

## 2017-04-06 NOTE — ED Notes (Signed)
Cultures sent at this time.

## 2017-04-06 NOTE — ED Notes (Signed)
Pt back from CT at this time 

## 2017-05-24 ENCOUNTER — Other Ambulatory Visit: Payer: Self-pay | Admitting: Family Medicine

## 2017-05-24 DIAGNOSIS — Z1231 Encounter for screening mammogram for malignant neoplasm of breast: Secondary | ICD-10-CM

## 2017-06-12 ENCOUNTER — Ambulatory Visit
Admission: RE | Admit: 2017-06-12 | Discharge: 2017-06-12 | Disposition: A | Discharge status: Home / Self Care | Payer: Medicare HMO | Source: Ambulatory Visit | Attending: Family Medicine | Admitting: Family Medicine

## 2017-06-12 DIAGNOSIS — Z1231 Encounter for screening mammogram for malignant neoplasm of breast: Secondary | ICD-10-CM | POA: Insufficient documentation

## 2017-12-07 DIAGNOSIS — R112 Nausea with vomiting, unspecified: Secondary | ICD-10-CM | POA: Insufficient documentation

## 2017-12-07 DIAGNOSIS — R197 Diarrhea, unspecified: Secondary | ICD-10-CM | POA: Insufficient documentation

## 2018-03-17 ENCOUNTER — Encounter: Payer: Self-pay | Admitting: Emergency Medicine

## 2018-03-17 ENCOUNTER — Emergency Department
Admission: EM | Admit: 2018-03-17 | Discharge: 2018-03-17 | Disposition: A | Discharge status: Home / Self Care | Payer: Medicare HMO | Attending: Emergency Medicine | Admitting: Emergency Medicine

## 2018-03-17 ENCOUNTER — Other Ambulatory Visit: Payer: Self-pay

## 2018-03-17 DIAGNOSIS — Z87891 Personal history of nicotine dependence: Secondary | ICD-10-CM | POA: Insufficient documentation

## 2018-03-17 DIAGNOSIS — I10 Essential (primary) hypertension: Secondary | ICD-10-CM | POA: Insufficient documentation

## 2018-03-17 DIAGNOSIS — J449 Chronic obstructive pulmonary disease, unspecified: Secondary | ICD-10-CM | POA: Insufficient documentation

## 2018-03-17 DIAGNOSIS — M545 Low back pain, unspecified: Secondary | ICD-10-CM

## 2018-03-17 DIAGNOSIS — R300 Dysuria: Secondary | ICD-10-CM | POA: Diagnosis not present

## 2018-03-17 DIAGNOSIS — Z79899 Other long term (current) drug therapy: Secondary | ICD-10-CM | POA: Insufficient documentation

## 2018-03-17 LAB — WET PREP, GENITAL
Clue Cells Wet Prep HPF POC: NONE SEEN
SPERM: NONE SEEN
Trich, Wet Prep: NONE SEEN
YEAST WET PREP: NONE SEEN

## 2018-03-17 LAB — URINALYSIS, COMPLETE (UACMP) WITH MICROSCOPIC
BACTERIA UA: NONE SEEN
BILIRUBIN URINE: NEGATIVE
Glucose, UA: NEGATIVE mg/dL
KETONES UR: NEGATIVE mg/dL
LEUKOCYTES UA: NEGATIVE
NITRITE: NEGATIVE
PROTEIN: NEGATIVE mg/dL
SQUAMOUS EPITHELIAL / LPF: NONE SEEN (ref 0–5)
Specific Gravity, Urine: 1.005 (ref 1.005–1.030)
pH: 6 (ref 5.0–8.0)

## 2018-03-17 MED ORDER — CYCLOBENZAPRINE HCL 5 MG PO TABS: 5.0000 mg | ORAL_TABLET | Freq: Three times a day (TID) | ORAL | 0 refills | Status: DC | PRN

## 2018-03-17 MED ORDER — ORPHENADRINE CITRATE 30 MG/ML IJ SOLN
60.0000 mg | INTRAMUSCULAR | Status: AC
  Administered 2018-03-17: 60 mg via INTRAMUSCULAR
  Filled 2018-03-17: qty 2

## 2018-03-17 MED ORDER — KETOROLAC TROMETHAMINE 10 MG PO TABS: 10.0000 mg | ORAL_TABLET | Freq: Three times a day (TID) | ORAL | 0 refills | Status: DC

## 2018-03-17 MED ORDER — KETOROLAC TROMETHAMINE 30 MG/ML IJ SOLN
30.0000 mg | Freq: Once | INTRAMUSCULAR | Status: AC
  Administered 2018-03-17: 30 mg via INTRAMUSCULAR
  Filled 2018-03-17: qty 1

## 2018-03-17 NOTE — ED Triage Notes (Signed)
States this am noted low back pain going down R leg. Hx sciatica. Denies knew injury.

## 2018-03-17 NOTE — ED Provider Notes (Signed)
[facility] Emergency Department Provider Note ____________________________________________  Time seen: 1715  I have reviewed the triage vital signs and the nursing notes.  HISTORY  Chief Complaint  Back Pain  HPI Shirley Barton is a 67 y.o. female with a history of degenerative disc disease presents to the ED for evaluation of right-sided low back pain.  Patient also describes some pelvic discomfort as well.  She is describing some dysuria without hematuria or urinary frequency.  She reports the symptoms over the last 2 to 3 days.  She denies any preceding trauma to her back pain.  She describes awakening this morning with some right-sided low back pain.  She has been evaluated in the past for similar symptoms.  Denies any bladder or bowel incontinence, saddle anesthesias, or footdrop.  Past Medical History:  Diagnosis Date  . Abdominal pain, generalized   . Anxiety   . Atypical chest pain   . Back pain   . Basal cell carcinoma of skin 2013   resected from Left scalp area.   Marland Kitchen COPD (chronic obstructive pulmonary disease) ([place])   . Depression   . DOE (dyspnea on exertion)   . GERD (gastroesophageal reflux disease)   . Hypertension   . Shortness of breath dyspnea     Patient Active Problem List   Diagnosis Date Noted  . PAD (peripheral artery disease) ([place]) 01/25/2016  . Iliac artery stenosis, bilateral ([place]) 01/25/2016  . Atherosclerosis of native artery of both lower extremities with intermittent claudication ([place]) 01/25/2016  . Generalized abdominal pain 10/06/2015    Past Surgical History:  Procedure Laterality Date  . BASAL CELL CARCINOMA EXCISION  2013  . CARPAL TUNNEL RELEASE Left 90s  . CHOLECYSTECTOMY N/A 09/16/2015   Procedure: LAPAROSCOPIC CHOLECYSTECTOMY WITH INTRAOPERATIVE CHOLANGIOGRAM;  Surgeon: Robert Bellow, MD;  Location: [facility] ORS;  Service: General;  Laterality: N/A;  . COLONOSCOPY  8676"H   [place]  . CORONARY ARTERY BYPASS  GRAFT    . ESOPHAGOGASTRODUODENOSCOPY (EGD) WITH PROPOFOL N/A 02/05/2017   Procedure: ESOPHAGOGASTRODUODENOSCOPY (EGD) WITH PROPOFOL;  Surgeon: Lollie Sails, MD;  Location: [facility] ENDOSCOPY;  Service: Endoscopy;  Laterality: N/A;  . PERIPHERAL VASCULAR CATHETERIZATION Bilateral 12/27/2015   Procedure: Lower Extremity Angiography;  Surgeon: Katha Cabal, MD;  Location: [place] CV LAB;  Service: Cardiovascular;  Laterality: Bilateral;  . TONSILLECTOMY      Prior to Admission medications   Medication Sig Start Date End Date Taking? Authorizing Provider  acetaminophen (TYLENOL) 325 MG tablet Take 650 mg by mouth every 6 (six) hours as needed.    [provider]  clopidogrel (PLAVIX) 75 MG tablet TAKE 1 TABLET BY MOUTH ONCE A DAY 07/26/16   Schnier, Dolores Lory, MD  cyanocobalamin 2000 MCG tablet Take 2,000 mcg daily by mouth.    [provider]  cyclobenzaprine (FLEXERIL) 5 MG tablet Take 1 tablet (5 mg total) by mouth 3 (three) times daily as needed for muscle spasms. 03/17/18   Xabi Wittler, Dannielle Karvonen, PA-C  dicyclomine (BENTYL) 10 MG capsule Take 10 mg 4 (four) times daily -  before meals and at bedtime by mouth.    [provider]  Fluticasone-Salmeterol (ADVAIR) 100-50 MCG/DOSE AEPB Inhale 1 puff into the lungs 2 (two) times daily.    [provider]  ipratropium (ATROVENT) 0.03 % nasal spray Place 2 sprays every 12 (twelve) hours into both nostrils.    [provider]  ketorolac (TORADOL) 10 MG tablet Take 1 tablet (10 mg  total) by mouth every 8 (eight) hours. 03/17/18   Tyleah Loh, Dannielle Karvonen, PA-C  metoCLOPramide (REGLAN) 5 MG tablet Take 1 tablet (5 mg total) by mouth 2 (two) times daily. 10/06/15   Robert Bellow, MD  mometasone (ELOCON) 0.1 % lotion Apply daily topically.    [provider]  ondansetron (ZOFRAN ODT) 4 MG disintegrating tablet Take 1 tablet (4 mg total) by mouth every 6 (six) hours as needed for nausea or  vomiting. 08/28/15   Delman Kitten, MD  pantoprazole (PROTONIX) 40 MG tablet Take by mouth. 09/07/15   [provider]  promethazine (PHENERGAN) 12.5 MG tablet Take 1 tablet (12.5 mg total) by mouth every 6 (six) hours as needed for nausea or vomiting. 04/06/17   Merlyn Lot, MD  sucralfate (CARAFATE) 1 g tablet Take 1 g 4 (four) times daily -  with meals and at bedtime by mouth.    [provider]    Allergies Tramadol; Cortisone; Cortizone-10 [hydrocortisone]; and Penicillins  Family History  Problem Relation Age of Onset  . Colon polyps Sister   . Cerebral palsy Daughter   . Breast cancer Neg Hx     Social History Social History   Tobacco Use  . Smoking status: Former Smoker    Last attempt to quit: 03/02/2012    Years since quitting: 6.0  . Smokeless tobacco: Never Used  Substance Use Topics  . Alcohol use: No    Alcohol/week: 0.0 standard drinks  . Drug use: No    Review of Systems  Constitutional: Negative for fever. Eyes: Negative for visual changes. ENT: Negative for sore throat. Cardiovascular: Negative for chest pain. Respiratory: Negative for shortness of breath. Gastrointestinal: Negative for abdominal pain, vomiting and diarrhea. Genitourinary: Negative for dysuria. Musculoskeletal: Negative for back pain. Skin: Negative for rash. Neurological: Negative for headaches, focal weakness or numbness. ____________________________________________  PHYSICAL EXAM:  VITAL SIGNS: ED Triage Vitals  Enc Vitals Group     BP 03/17/18 1430 136/75     Pulse Rate 03/17/18 1430 99     Resp 03/17/18 1430 20     Temp 03/17/18 1430 98.9 F (37.2 C)     Temp Source 03/17/18 1430 Oral     SpO2 03/17/18 1430 100 %     Weight 03/17/18 1431 115 lb (52.2 kg)     Height 03/17/18 1431 5' 4.5" (1.638 m)     Head Circumference --      Peak Flow --      Pain Score 03/17/18 1430 10     Pain Loc --      Pain Edu? --      Excl. in [place]? --     Constitutional:  Alert and oriented. Well appearing and in no distress. Head: Normocephalic and atraumatic. Eyes: Conjunctivae are normal. PERRL. Normal extraocular movements Ears: Canals clear. TMs intact bilaterally. Nose: No congestion/rhinorrhea/epistaxis. Mouth/Throat: Mucous membranes are moist. Neck: Supple. No thyromegaly. Hematological/Lymphatic/Immunological: No cervical lymphadenopathy. Cardiovascular: Normal rate, regular rhythm. Normal distal pulses. Respiratory: Normal respiratory effort. No wheezes/rales/rhonchi. Gastrointestinal: Soft and nontender. No distention. Musculoskeletal: Nontender with normal range of motion in all extremities.  Neurologic:  Normal gait without ataxia. Normal speech and language. No gross focal neurologic deficits are appreciated. Skin:  Skin is warm, dry and intact. No rash noted. Psychiatric: Mood and affect are normal. Patient exhibits appropriate insight and judgment. ____________________________________________   LABS (pertinent positives/negatives)  Labs Reviewed  WET PREP, GENITAL - Abnormal; Notable for the following components:  Result Value   WBC, Wet Prep HPF POC MODERATE (*)    All other components within normal limits  URINALYSIS, COMPLETE (UACMP) WITH MICROSCOPIC - Abnormal; Notable for the following components:   Color, Urine STRAW (*)    APPearance CLEAR (*)    Hgb urine dipstick SMALL (*)    All other components within normal limits  URINE CULTURE  ____________________________________________  PROCEDURES  Procedures Toradol 30 mg IM Norflex 60 mg IM ____________________________________________  INITIAL IMPRESSION / ASSESSMENT AND PLAN / ED COURSE  Patient with ED evaluation of acute low back pain as well as some dysuria.  Her urinalysis is negative for any acute cystitis.  Urine culture will be sent as patient has had previous symptoms despite having normal urinalysis.  Treatment will depend on the disposition of her urine  culture.  She will be discharged with instructions for ketorolac and Flexeril to take as needed.  She is to follow with primary provider or return to the ED as needed. ____________________________________________  FINAL CLINICAL IMPRESSION(S) / ED DIAGNOSES  Final diagnoses:  Acute right-sided low back pain without sciatica  Dysuria      Melvenia Needles, PA-C 03/17/18 1831    Nance Pear, MD 03/17/18 [phone]

## 2018-03-17 NOTE — ED Notes (Addendum)
Patient presents to the ED with neck pain, lower back pain, right hip pain radiating down right leg.  Patient states, "I feel like I have burning lower down."  Patient holding her pelvic/lower abdominal area.  Patient reports vaginal itching and burning with urination.  Patient states her right leg is numb.  Patient states her right arm feels sore.  Patient states after waking up this morning she couldn't walk which has never happened before.  Patient reports shortness of breath with walking short distances.  Patient reports history of COPD.

## 2018-03-17 NOTE — Discharge Instructions (Addendum)
Your exam and labs are essentially normal at this time. You have a urine culture pending and will be notified if treatment is necessary. Take the prescription meds as directed. Follow-up with your provider for ongoing symptoms.

## 2018-03-19 LAB — URINE CULTURE
Culture: <10000 — AB
Special Requests: NORMAL

## 2018-03-27 ENCOUNTER — Other Ambulatory Visit: Payer: Self-pay

## 2018-03-27 DIAGNOSIS — Z1211 Encounter for screening for malignant neoplasm of colon: Secondary | ICD-10-CM

## 2018-04-09 ENCOUNTER — Telehealth: Payer: Self-pay | Admitting: Gastroenterology

## 2018-04-09 ENCOUNTER — Telehealth: Payer: Self-pay

## 2018-04-09 NOTE — Telephone Encounter (Signed)
Patient called and would like to cancel her colonoscopy scheduled for 04-14-17 with Dr Vicente Males and reschedule in around March 2020. 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

## 2018-04-09 NOTE — Telephone Encounter (Signed)
LVM for pt to call office back to reschedule her procedure to March.  Her procedure has not been canceled yet-I'm awating call back to reschedule.  Thanks [employer]

## 2018-04-09 NOTE — Telephone Encounter (Signed)
Patient returned call.  She stated that she will call back in a few weeks to reschedule.  She wants to see how things go with her daughter.  No Cancellation Fee  Thanks Sharyn Lull

## 2018-04-14 ENCOUNTER — Ambulatory Visit: Admission: RE | Admit: 2018-04-14 | Payer: Medicare HMO | Source: Home / Self Care | Admitting: Gastroenterology

## 2018-04-14 ENCOUNTER — Encounter: Admission: RE | Payer: Self-pay | Source: Home / Self Care

## 2018-04-14 SURGERY — COLONOSCOPY WITH PROPOFOL
Anesthesia: General

## 2018-08-20 ENCOUNTER — Other Ambulatory Visit: Payer: Self-pay

## 2018-08-20 ENCOUNTER — Encounter: Payer: Self-pay | Admitting: Licensed Clinical Social Worker

## 2018-08-20 ENCOUNTER — Ambulatory Visit (INDEPENDENT_AMBULATORY_CARE_PROVIDER_SITE_OTHER): Payer: Medicare HMO | Admitting: Licensed Clinical Social Worker

## 2018-08-20 DIAGNOSIS — F4310 Post-traumatic stress disorder, unspecified: Secondary | ICD-10-CM | POA: Diagnosis not present
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
## 2018-08-27 ENCOUNTER — Encounter: Payer: Self-pay | Admitting: Psychiatry

## 2018-08-27 ENCOUNTER — Other Ambulatory Visit: Payer: Self-pay

## 2018-08-27 ENCOUNTER — Ambulatory Visit (INDEPENDENT_AMBULATORY_CARE_PROVIDER_SITE_OTHER): Payer: Medicare HMO | Admitting: Psychiatry

## 2018-08-27 DIAGNOSIS — F1021 Alcohol dependence, in remission: Secondary | ICD-10-CM

## 2018-08-27 DIAGNOSIS — F321 Major depressive disorder, single episode, moderate: Secondary | ICD-10-CM | POA: Diagnosis not present

## 2018-08-27 DIAGNOSIS — F4310 Post-traumatic stress disorder, unspecified: Secondary | ICD-10-CM | POA: Diagnosis not present

## 2018-08-27 MED ORDER — SERTRALINE HCL 50 MG PO TABS: 50.0000 mg | ORAL_TABLET | Freq: Every day | ORAL | 1 refills | Status: DC

## 2018-08-27 MED ORDER — TRAZODONE HCL 50 MG PO TABS: 25.0000 mg | ORAL_TABLET | Freq: Every evening | ORAL | 1 refills | Status: DC | PRN
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
## 2018-09-08 ENCOUNTER — Ambulatory Visit (INDEPENDENT_AMBULATORY_CARE_PROVIDER_SITE_OTHER): Payer: Medicare HMO | Admitting: Licensed Clinical Social Worker

## 2018-09-08 ENCOUNTER — Other Ambulatory Visit: Payer: Self-pay

## 2018-09-08 ENCOUNTER — Encounter: Payer: Self-pay | Admitting: Licensed Clinical Social Worker

## 2018-09-08 DIAGNOSIS — F4310 Post-traumatic stress disorder, unspecified: Secondary | ICD-10-CM | POA: Diagnosis not present

## 2018-09-08 DIAGNOSIS — F321 Major depressive disorder, single episode, moderate: Secondary | ICD-10-CM

## 2018-09-08 NOTE — Progress Notes (Signed)
Virtual Visit via Telephone Note  I connected with Shirley Barton on 09/08/18 at 11:00 AM EDT by telephone and verified that I am speaking with the correct person using two identifiers.   I discussed the limitations, risks, security and privacy concerns of performing an evaluation and management service by telephone and the availability of in person appointments. I also discussed with the patient that there may be a patient responsible charge related to this service. The patient expressed understanding and agreed to proceed.  I discussed the assessment and treatment plan with the patient. The patient was provided an opportunity to ask questions and all were answered. The patient agreed with the plan and demonstrated an understanding of the instructions.   The patient was advised to call back or seek an in-person evaluation if the symptoms worsen or if the condition fails to improve as anticipated.  I provided 55 minutes of non-face-to-face time during this encounter.   Alden Hipp, LCSW    THERAPIST PROGRESS NOTE  Session Time: 1100  Participation Level: Active  Behavioral Response: NeatAlertAnxious  Type of Therapy: Individual Therapy  Treatment Goals addressed: Anxiety  Interventions: CBT  Summary: Shirley Barton is a 68 y.o. female who presents with continued symptoms related to her diagnosis. She reports doing, "okay," since our last session. She reports ongoing conflict in her relationships with her children. She went into several conflicts she's had with her oldest son and daughter. Shirley Barton reported conflicts that started several months ago, "and I just don't know what to do about it anymore." LCSW encouraged Shirley Barton to start looking at what parts of situations she has control over and asked her to focus on those. LCSW explained if she cannot control a situation, she likely will need to rely on communication with the others involved to come to any sort of resolution. She reported her  children were not willing to talk to her at this time. LCSW highlighted how that is an aspect of the situation she cannot control, and she should not spend energy focusing on that part--but rather what she can say when they do decide to continue communication. Shirley Barton expressed understanding and agreement with this idea. She went on to discussing not wanting to leave the house, and being worried when she leaves. "I had to talk myself into going to the end of the road to get the trashcan." LCSW helped Shirley Barton recognize that, although she had to talk herself through it, she was ultimately able to reach the goal she'd set for herself. Shirley Barton expressed understanding and agreement. LCSW encouraged Shirley Barton to continue using positive self talk to meet daily goals. [place] expressed agreement.   Suicidal/Homicidal: No  Therapist Response: Shirley Barton continues to work towards her tx goals but has not yet reached them. We will continue to work on processing past trauma and improving emotional regulation skills moving forward.   Plan: Return again in 2 weeks.  Diagnosis: Axis I: Post Traumatic Stress Disorder    Axis II: No diagnosis    Alden Hipp, LCSW 09/08/2018

## 2018-09-22 ENCOUNTER — Other Ambulatory Visit: Payer: Self-pay | Admitting: Psychiatry

## 2018-09-22 DIAGNOSIS — F4310 Post-traumatic stress disorder, unspecified: Secondary | ICD-10-CM

## 2018-09-22 DIAGNOSIS — F321 Major depressive disorder, single episode, moderate: Secondary | ICD-10-CM

## 2018-09-24 ENCOUNTER — Other Ambulatory Visit: Payer: Self-pay

## 2018-09-24 ENCOUNTER — Encounter: Payer: Self-pay | Admitting: Licensed Clinical Social Worker

## 2018-09-24 ENCOUNTER — Ambulatory Visit (INDEPENDENT_AMBULATORY_CARE_PROVIDER_SITE_OTHER): Payer: Medicare HMO | Admitting: Licensed Clinical Social Worker

## 2018-09-24 DIAGNOSIS — F4310 Post-traumatic stress disorder, unspecified: Secondary | ICD-10-CM

## 2018-09-24 NOTE — Progress Notes (Signed)
Virtual Visit via Video Note  I connected with Shirley Barton on 09/24/18 at  9:00 AM EDT by a video enabled telemedicine application and verified that I am speaking with the correct person using two identifiers.   I discussed the limitations of evaluation and management by telemedicine and the availability of in person appointments. The patient expressed understanding and agreed to proceed.  I discussed the assessment and treatment plan with the patient. The patient was provided an opportunity to ask questions and all were answered. The patient agreed with the plan and demonstrated an understanding of the instructions.   The patient was advised to call back or seek an in-person evaluation if the symptoms worsen or if the condition fails to improve as anticipated.  I provided 56 minutes of non-face-to-face time during this encounter.   Alden Hipp, LCSW    THERAPIST PROGRESS NOTE  Session Time: 0900  Participation Level: Active  Behavioral Response: Well GroomedAlertDepressed  Type of Therapy: Individual Therapy  Treatment Goals addressed: Anxiety  Interventions: Supportive  Summary: Shirley Barton is a 68 y.o. female who presents with continued symptoms related to her diagnosis. Shirley Barton reports doing well since our last session, but reports "not a lot has changed." She reports staying in bed most of the day, and feeling indifferent about other activities. She reports no contact with her children, except to wish them a happy fathers and mothers day. She reports they both text back and said thank you. She reports missing her children and not knowing how to repair their relationships. We spent time discussing what their relationships looked like prior to them having a falling out, and what patterns had developed in their relationships. She was able to identify her son-in-law as a primary source of stress on her relationship with her daughter. She reports her son in law is a "hateful person,"  and can easily "get her to think differently about me." LCSW encouraged Shirley Barton to reach out to her daughter at time when she didn't think the husband would be at home. She reported feeling nervous her daughter would not respond. We discussed different options, but ultimately resolved Shirley Barton would likely need to make the first step to mending the fence between the two of them. Shirley Barton expressed frustration and fear around the situation. LCSW normalized and validated these feelings, and we discussed ways to improve communication when she finally makes contact.   Suicidal/Homicidal: No  Therapist Response: Shirley Barton continues to work towards her tx goals but has not yet reached them. We will continue to work on emotional regulation skills moving forward.   Plan: Return again in 4 weeks.  Diagnosis: Axis I: Post Traumatic Stress Disorder    Axis II: No diagnosis    Alden Hipp, LCSW 09/24/2018

## 2018-10-16 ENCOUNTER — Encounter: Payer: Self-pay | Admitting: Licensed Clinical Social Worker

## 2018-10-16 ENCOUNTER — Other Ambulatory Visit: Payer: Self-pay

## 2018-10-16 ENCOUNTER — Ambulatory Visit (INDEPENDENT_AMBULATORY_CARE_PROVIDER_SITE_OTHER): Payer: Medicare HMO | Admitting: Licensed Clinical Social Worker

## 2018-10-16 DIAGNOSIS — F4310 Post-traumatic stress disorder, unspecified: Secondary | ICD-10-CM

## 2018-10-16 DIAGNOSIS — F321 Major depressive disorder, single episode, moderate: Secondary | ICD-10-CM | POA: Diagnosis not present
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
## 2018-10-22 ENCOUNTER — Other Ambulatory Visit: Payer: Self-pay | Admitting: Psychiatry

## 2018-10-22 DIAGNOSIS — F4310 Post-traumatic stress disorder, unspecified: Secondary | ICD-10-CM

## 2018-10-22 DIAGNOSIS — F321 Major depressive disorder, single episode, moderate: Secondary | ICD-10-CM

## 2018-10-28 MED ORDER — SERTRALINE HCL 50 MG PO TABS: 50.0000 mg | ORAL_TABLET | Freq: Every day | ORAL | 1 refills | Status: DC

## 2018-10-28 NOTE — Telephone Encounter (Signed)
Sent Zoloft to pharmacy

## 2018-11-11 ENCOUNTER — Encounter: Payer: Self-pay | Admitting: Licensed Clinical Social Worker

## 2018-11-11 ENCOUNTER — Ambulatory Visit (INDEPENDENT_AMBULATORY_CARE_PROVIDER_SITE_OTHER): Payer: Medicare HMO | Admitting: Licensed Clinical Social Worker

## 2018-11-11 ENCOUNTER — Other Ambulatory Visit: Payer: Self-pay

## 2018-11-11 DIAGNOSIS — F321 Major depressive disorder, single episode, moderate: Secondary | ICD-10-CM | POA: Diagnosis not present

## 2018-11-11 DIAGNOSIS — F4310 Post-traumatic stress disorder, unspecified: Secondary | ICD-10-CM

## 2018-11-11 NOTE — Progress Notes (Signed)
Virtual Visit via Video Note  I connected with Shirley Barton on 11/11/18 at  1:30 PM EDT by a video enabled telemedicine application and verified that I am speaking with the correct person using two identifiers.   I discussed the limitations of evaluation and management by telemedicine and the availability of in person appointments. The patient expressed understanding and agreed to proceed.   I discussed the assessment and treatment plan with the patient. The patient was provided an opportunity to ask questions and all were answered. The patient agreed with the plan and demonstrated an understanding of the instructions.   The patient was advised to call back or seek an in-person evaluation if the symptoms worsen or if the condition fails to improve as anticipated.  I provided 45 minutes of non-face-to-face time during this encounter.   Alden Hipp, LCSW   THERAPIST PROGRESS NOTE  Session Time: 1330  Participation Level: Active  Behavioral Response: CasualAlertAnxious and Depressed  Type of Therapy: Individual Therapy  Treatment Goals addressed: Anxiety  Interventions: Supportive  Summary: Shirley Barton is a 68 y.o. female who presents with continued symptoms related to her diagnosis. Shirley Barton reports doing well since our last session. She reports she spent a lot of time with her youngest son, which she believes helped her mood significantly. She reports she was finally able to reach out to her daughter. She sent balloons and a teddy bear to the hospital where her daughter was having treatment. This opened the door for her daughter to communicate with her after. Shirley Barton was able to have a "heart to heart," with her daughter, and they were able to mend fences. Shirley Barton recounted this situation for LCSW, and was tearful at times while discussing it. She reported feeling guilty about the situation. LCSW encouraged Shirley Barton to recognize she did not create the problem by herself, and it was such a  wonderful thing she was able to make the first step. Shirley Barton was able to recognize this as well, and reported her mood has sincerely improved since she was able to speak with her daughter. She reports they have continued speaking since the bigger conversation, and Shirley Barton has noticed a change in her mood. LCSW validated this idea and noted how resolutions of conflict can play such a large role in our mental health. Shirley Barton expressed agreement with this idea.   Suicidal/Homicidal: No  Therapist Response: Shirley Barton continues to work towards her tx goals but has not yet reached them. We will continue to work on emotional regulation skills moving forward as well as improving communication skills.   Plan: Return again in 4 weeks.  Diagnosis: Axis I: Post Traumatic Stress Disorder    Axis II: No diagnosis    Alden Hipp, LCSW 11/11/2018

## 2018-12-12 ENCOUNTER — Encounter: Payer: Self-pay | Admitting: Licensed Clinical Social Worker

## 2018-12-12 ENCOUNTER — Ambulatory Visit (INDEPENDENT_AMBULATORY_CARE_PROVIDER_SITE_OTHER): Payer: Medicare HMO | Admitting: Licensed Clinical Social Worker

## 2018-12-12 ENCOUNTER — Other Ambulatory Visit: Payer: Self-pay

## 2018-12-12 DIAGNOSIS — F4310 Post-traumatic stress disorder, unspecified: Secondary | ICD-10-CM | POA: Diagnosis not present

## 2018-12-12 NOTE — Progress Notes (Signed)
Virtual Visit via Telephone Note  I connected with Shirley Barton on 12/12/18 at 11:00 AM EDT by telephone and verified that I am speaking with the correct person using two identifiers.   I discussed the limitations, risks, security and privacy concerns of performing an evaluation and management service by telephone and the availability of in person appointments. I also discussed with the patient that there may be a patient responsible charge related to this service. The patient expressed understanding and agreed to proceed.   I discussed the assessment and treatment plan with the patient. The patient was provided an opportunity to ask questions and all were answered. The patient agreed with the plan and demonstrated an understanding of the instructions.   The patient was advised to call back or seek an in-person evaluation if the symptoms worsen or if the condition fails to improve as anticipated.  I provided 53 minutes of non-face-to-face time during this encounter.   Alden Hipp, LCSW    THERAPIST PROGRESS NOTE  Session Time: 1100  Participation Level: Active  Behavioral Response: CasualAlertDepressed  Type of Therapy: Individual Therapy  Treatment Goals addressed: Coping  Interventions: Supportive  Summary: Shirley Barton is a 68 y.o. female who presents with continued symptoms related to her diagnosis. Shirley Barton reports she has not been doing well since our last session because her daughter passed away. Shirley Barton was tearful during our session and, at times, was unable to complete thoughts. LCSW held space for Shirley Barton to talk about her daughter, in any way that she wanted or needed to. Shirley Barton discussed going to see her for the last time, and "when she came home from the Barton, I thought she was doing better. I thought she'd be able to talk, but she wasn't." LCSW validated these feelings and allowed Shirley Barton to continue processing out loud. She reported feeling resentful of her daughter's husband  and daughter, as they had not been there for her daughter over the last five years. We discussed how, often, when someone passes away, it can seem like a competition of who knew the person better. Shirley Barton reported feeling that way often, which contributes to her resentment of others. She reports since her passing, Shirley Barton has not been eating regularly, and has had difficulty doing daily activities. Today, however, she is going to stay with her youngest son for a few weeks until she begins to feel better. LCSW discussed grief, and how it is not a linear process. We also discussed that, when she starts to feel better, she may feel guilty because of that. We discussed ways to process those feelings and manage them in the moment.   Suicidal/Homicidal: No  Therapist Response: Shirley Barton continues to work towards her tx goals but has not yet reached them. We will continue to work on emotional regulation skills moving forward and processing her recent loss.   Plan: Return again in 4 weeks.  Diagnosis: Axis I: Post Traumatic Stress Disorder    Axis II: No diagnosis    Alden Hipp, LCSW 12/12/2018

## 2019-01-12 ENCOUNTER — Ambulatory Visit: Payer: Self-pay | Admitting: Licensed Clinical Social Worker

## 2019-02-05 ENCOUNTER — Encounter: Payer: Self-pay | Admitting: Internal Medicine

## 2019-02-05 ENCOUNTER — Telehealth: Payer: Self-pay

## 2019-02-05 NOTE — Telephone Encounter (Signed)
Gastroenterology Pre-Procedure Review  Request Date: PENDING (Pt needs to workout transportation) Requesting Physician: Dr. PENDING  PATIENT REVIEW QUESTIONS: The patient responded to the following health history questions as indicated:    1. Are you having any GI issues? yes (stomach pain on the right side) 2. Do you have a personal history of Polyps? no 3. Do you have a family history of Colon Cancer or Polyps? no 4. Diabetes Mellitus? no 5. Joint replacements in the past 12 months?no 6. Major health problems in the past 3 months?no 7. Any artificial heart valves, MVP, or defibrillator?no    MEDICATIONS & ALLERGIES:    Patient reports the following regarding taking any anticoagulation/antiplatelet therapy:   Plavix, Coumadin, Eliquis, Xarelto, Lovenox, Pradaxa, Brilinta, or Effient? no Aspirin? 81mg   Patient confirms/reports the following medications:  Current Outpatient Medications  Medication Sig Dispense Refill  . acetaminophen (TYLENOL) 325 MG tablet Take 650 mg by mouth every 6 (six) hours as needed.    . cetirizine (ZYRTEC) 10 MG tablet Take 10 mg by mouth daily.    . cyanocobalamin 2000 MCG tablet Take 2,000 mcg daily by mouth.    . cyclobenzaprine (FLEXERIL) 5 MG tablet Take 1 tablet (5 mg total) by mouth 3 (three) times daily as needed for muscle spasms. 15 tablet 0  . dicyclomine (BENTYL) 10 MG capsule Take 10 mg 4 (four) times daily -  before meals and at bedtime by mouth.    . Fluticasone-Salmeterol (ADVAIR) 100-50 MCG/DOSE AEPB Inhale 1 puff into the lungs 2 (two) times daily.    Marland Kitchen ipratropium (ATROVENT) 0.03 % nasal spray Place 2 sprays every 12 (twelve) hours into both nostrils.    Marland Kitchen ketorolac (TORADOL) 10 MG tablet Take 1 tablet (10 mg total) by mouth every 8 (eight) hours. 15 tablet 0  . metoCLOPramide (REGLAN) 5 MG tablet Take 1 tablet (5 mg total) by mouth 2 (two) times daily. 30 tablet 0  . mometasone (ELOCON) 0.1 % lotion Apply daily topically.    . ondansetron  (ZOFRAN ODT) 4 MG disintegrating tablet Take 1 tablet (4 mg total) by mouth every 6 (six) hours as needed for nausea or vomiting. 20 tablet 0  . pantoprazole (PROTONIX) 40 MG tablet Take by mouth.    . promethazine (PHENERGAN) 12.5 MG tablet Take 1 tablet (12.5 mg total) by mouth every 6 (six) hours as needed for nausea or vomiting. 12 tablet 0  . sertraline (ZOLOFT) 50 MG tablet Take 1 tablet (50 mg total) by mouth daily with breakfast. 30 tablet 1  . sucralfate (CARAFATE) 1 g tablet Take 1 g 4 (four) times daily -  with meals and at bedtime by mouth.    . traZODone (DESYREL) 50 MG tablet TAKE 1/2 TO 1 TABLET BY MOUTH AT BEDTIME AS NEEDED FOR SLEEP 90 tablet 1  . Vitamin D, Ergocalciferol, (DRISDOL) 1.25 MG (50000 UT) CAPS capsule TAKE 1 CAPSULE BY MOUTH ONE TIME PER WEEK     No current facility-administered medications for this visit.     Patient confirms/reports the following allergies:  Allergies  Allergen Reactions  . Tramadol   . Cortisone Rash    In 2013  . Cortizone-10 [Hydrocortisone] Rash  . Penicillins Rash    No orders of the defined types were placed in this encounter.   AUTHORIZATION INFORMATION Primary Insurance: 1D#: Group #:  Secondary Insurance: 1D#: Group #:  SCHEDULE INFORMATION: Date: PENDING Time: Location:[facility]

## 2019-02-20 ENCOUNTER — Encounter: Payer: Self-pay | Admitting: *Deleted

## 2019-03-06 ENCOUNTER — Other Ambulatory Visit: Payer: Self-pay | Admitting: Student

## 2019-03-06 DIAGNOSIS — M5441 Lumbago with sciatica, right side: Secondary | ICD-10-CM

## 2019-03-06 DIAGNOSIS — M4807 Spinal stenosis, lumbosacral region: Secondary | ICD-10-CM

## 2019-03-06 DIAGNOSIS — M47816 Spondylosis without myelopathy or radiculopathy, lumbar region: Secondary | ICD-10-CM

## 2019-03-06 DIAGNOSIS — G8929 Other chronic pain: Secondary | ICD-10-CM

## 2019-03-06 DIAGNOSIS — M5416 Radiculopathy, lumbar region: Secondary | ICD-10-CM

## 2019-03-14 ENCOUNTER — Other Ambulatory Visit: Payer: Self-pay

## 2019-03-14 ENCOUNTER — Ambulatory Visit
Admission: RE | Admit: 2019-03-14 | Discharge: 2019-03-14 | Disposition: A | Discharge status: Home / Self Care | Payer: Medicare HMO | Source: Ambulatory Visit | Attending: Student | Admitting: Student

## 2019-03-14 ENCOUNTER — Encounter (INDEPENDENT_AMBULATORY_CARE_PROVIDER_SITE_OTHER): Payer: Self-pay

## 2019-03-14 DIAGNOSIS — M5441 Lumbago with sciatica, right side: Secondary | ICD-10-CM | POA: Diagnosis not present

## 2019-03-14 DIAGNOSIS — G8929 Other chronic pain: Secondary | ICD-10-CM

## 2019-03-14 DIAGNOSIS — M47816 Spondylosis without myelopathy or radiculopathy, lumbar region: Secondary | ICD-10-CM | POA: Diagnosis present

## 2019-03-14 DIAGNOSIS — M4807 Spinal stenosis, lumbosacral region: Secondary | ICD-10-CM | POA: Diagnosis present

## 2019-03-14 DIAGNOSIS — M5416 Radiculopathy, lumbar region: Secondary | ICD-10-CM | POA: Diagnosis present

## 2019-03-14 IMAGING — MR MR LUMBAR SPINE W/O CM
4 of 5 series · 30 of 48 positions shown · non-contrast
Comparison: CT Abdomen and Pelvis [DATE].

CLINICAL DATA: 68-year-old female with recurrent right side low
back pain radiating to the hip with weakness for 1 year. No known
injury.

EXAM:
MRI LUMBAR SPINE WITHOUT CONTRAST
TECHNIQUE: Multiplanar, multisequence MR imaging of the lumbar spine was
performed. No intravenous contrast was administered.

[Series 5: T2 · sagittal · 4.0mm · 0.81mm/px · 6 of 15 slices shown (1 of 2)]
[im 1/15]
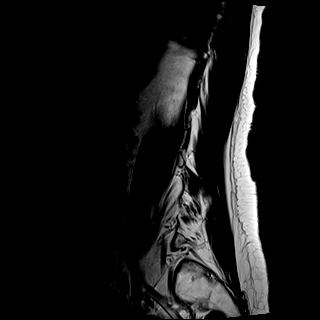
[im 3/15]
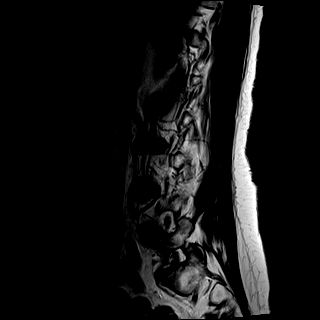
[im 6/15]
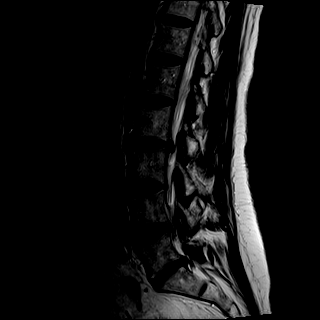
[im 9/15]
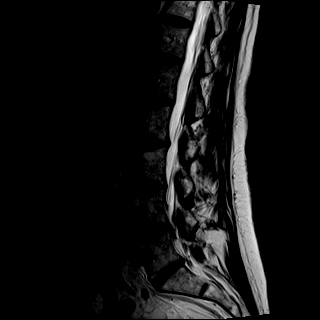
[im 12/15]
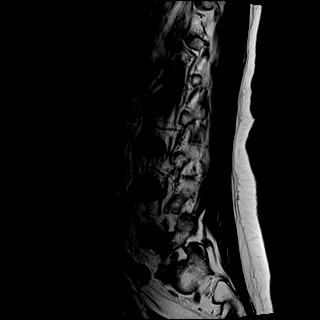
[im 15/15]
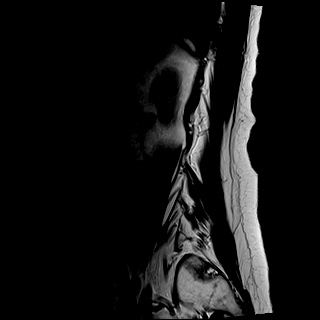

[Series 6: T1 · sagittal · 4.0mm · 0.81mm/px · 6 of 15 slices shown (1 of 2)]
[im 1/15]
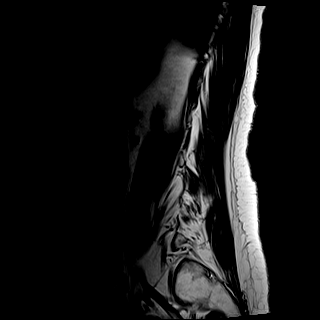
[im 3/15]
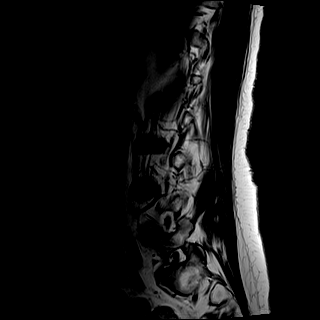
[im 6/15]
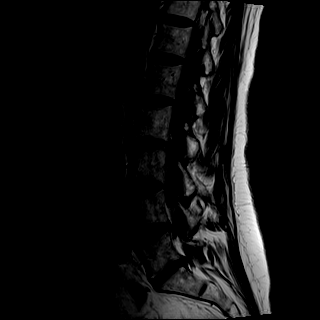
[im 9/15]
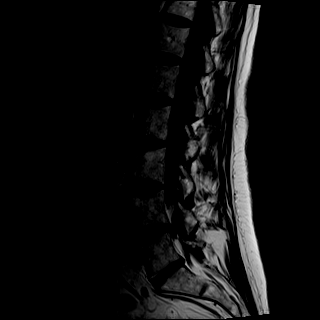
[im 12/15]
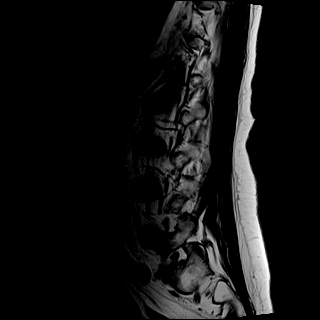
[im 15/15]
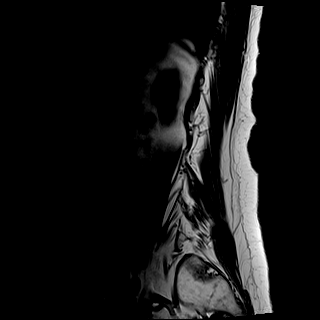

[Series 8: T2 · axial · 4.0mm · 0.78mm/px · z∈[-157,+57]mm · 9 of 38 slices shown (2 of 2)]
[im 1/38]
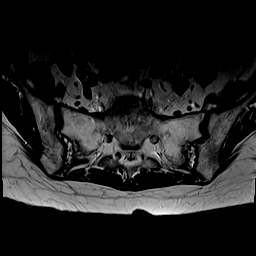
[im 6/38]
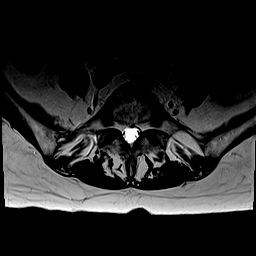
[im 11/38]
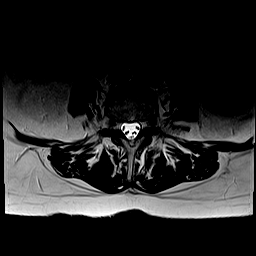
[im 16/38]
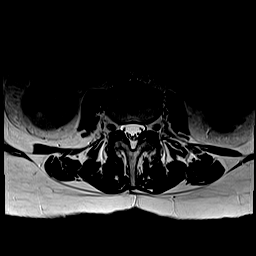
[im 19/38]
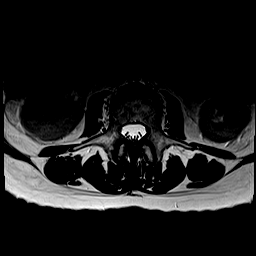
[im 22/38]
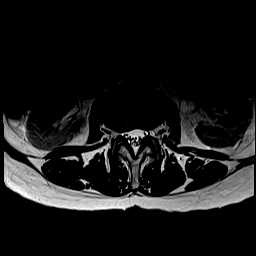
[im 27/38]
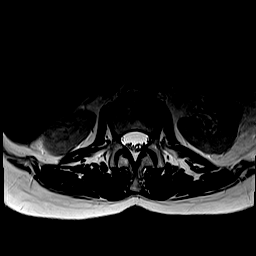
[im 32/38]
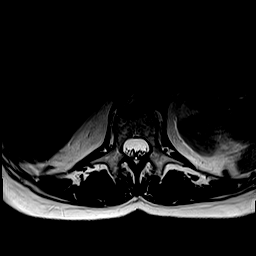
[im 38/38]
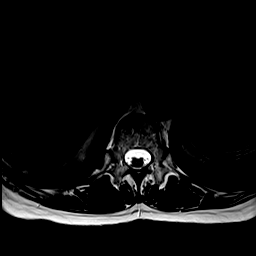

[Series 9: T1 · axial · 4.0mm · 0.39mm/px · z∈[-157,+57]mm · 9 of 38 slices shown (2 of 2)]
[im 1/38]
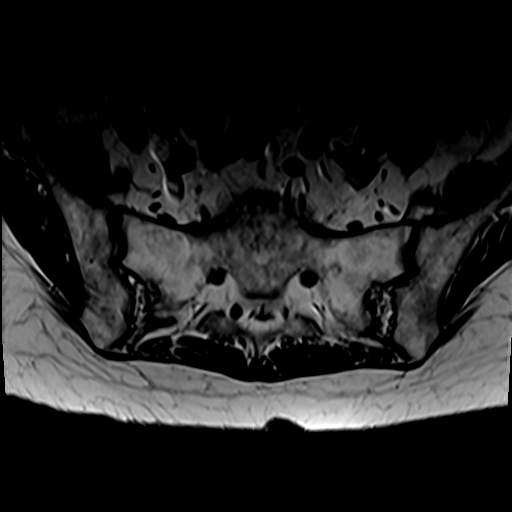
[im 6/38]
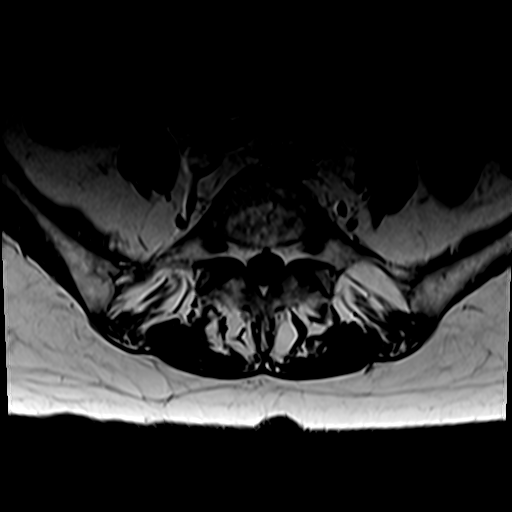
[im 11/38]
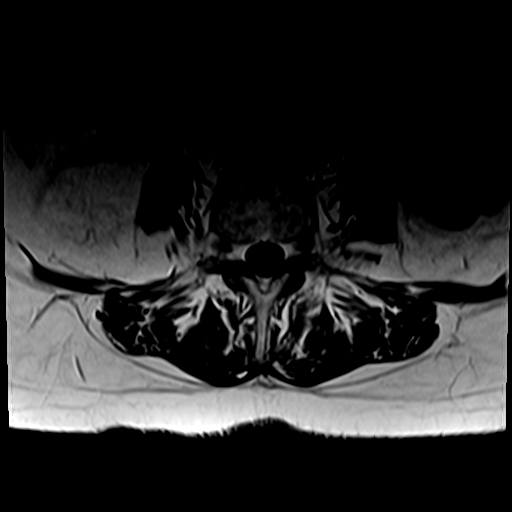
[im 16/38]
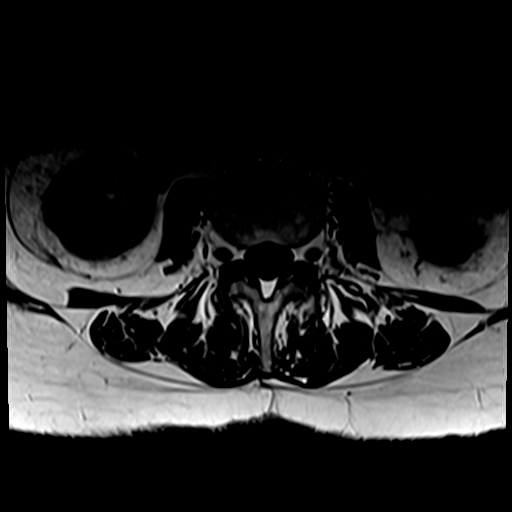
[im 19/38]
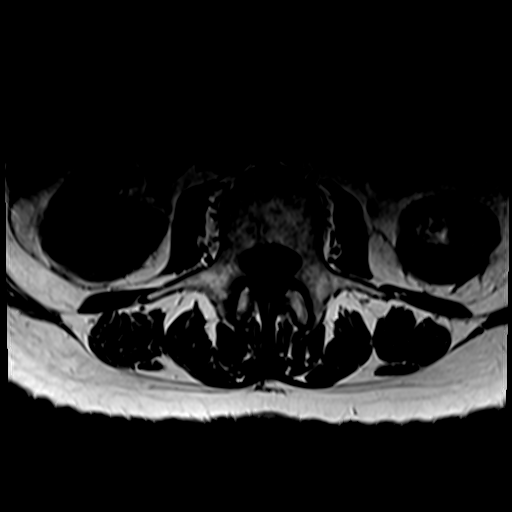
[im 22/38]
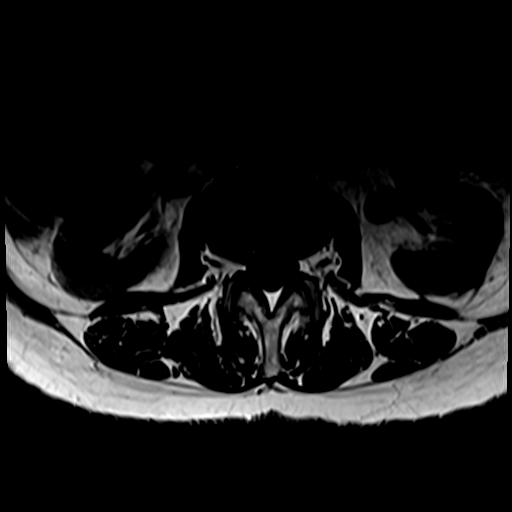
[im 27/38]
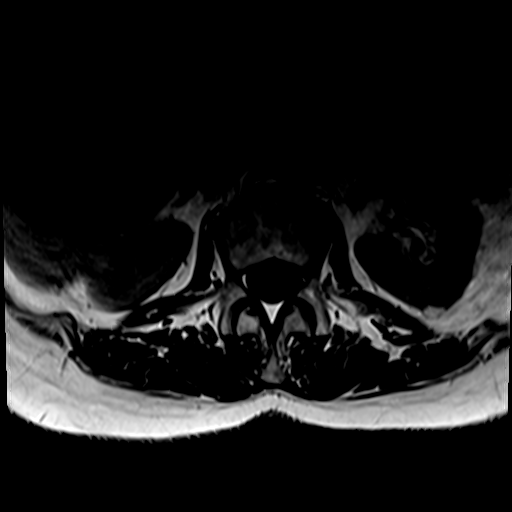
[im 32/38]
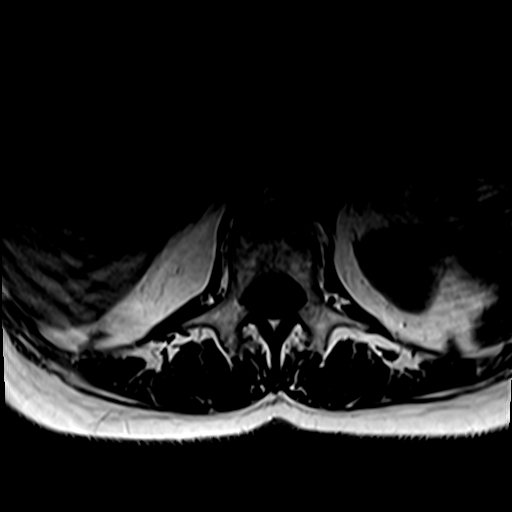
[im 38/38]
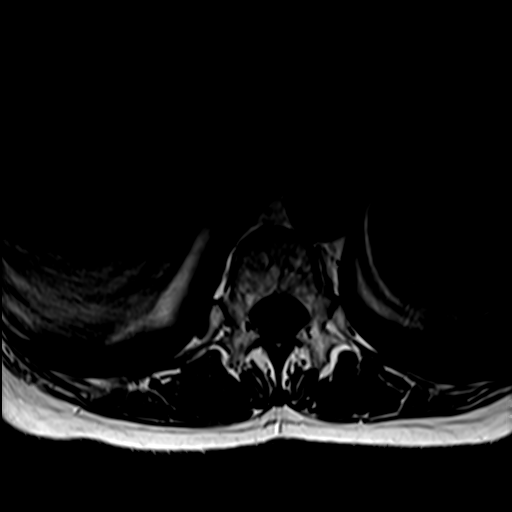

[30 of 48 positions shown; findings below may reference images not displayed]

FINDINGS: Segmentation:  Normal on the comparison.

Alignment: Stable lumbar lordosis. Subtle retrolisthesis of L5 on S1
is stable.

Vertebrae: No marrow edema or evidence of acute osseous abnormality.
Visualized bone marrow signal is within normal limits. Intact
visible sacrum and SI joints.

Conus medullaris and cauda equina: Conus extends to the T12-L1
level. No lower spinal cord or conus signal abnormality.

Paraspinal and other soft tissues: Chronic atherosclerosis of the
abdominal aorta with borderline aneurysmal enlargement in [R4]. The
infrarenal abdominal aorta now measures up to 29 millimeters
diameter (series 5 images 9 through 11 and series 8, image 28) with
chronic mural plaque or thrombus. Superimposed chronic bilateral
common iliac artery stents.

Otherwise negative visualized abdominal viscera and paraspinal soft
tissues.

Disc levels:

Negative visible lower thoracic levels through T12-L1.

L1-L2:  Minimal disc bulge.

L2-L3: Right eccentric disc bulging with broad-based right
subarticular small to moderate size disc extrusion (series 5, image
6 and series 8, image 18). Mild superimposed posterior element
hypertrophy. No spinal or foraminal stenosis. Mild to moderate right
lateral recess stenosis (right L3 nerve level).

L3-L4: Negative disc. Mild to moderate facet hypertrophy without
stenosis.

L4-L5: Minimal disc bulge. Mild to moderate posterior element
hypertrophy. No spinal stenosis. Borderline to mild lateral recess
stenosis greater on the left (L5 nerve levels). No foraminal
stenosis.

L5-S1: Chronic mild retrolisthesis with circumferential disc
osteophyte complex, mostly far lateral. Mild posterior element
hypertrophy. Mild right lateral recess stenosis (right S1 nerve
level). Mild right L5 foraminal stenosis.
IMPRESSION: 1. Evidence of progressed ectasia of the infrarenal abdominal aorta
since [R4], now mildly aneurysmal up to 29 mm diameter. Recommend
followup by Ultrasound in 3 years.
This recommendation follows ACR consensus guidelines: White Paper of
the ACR Incidental Findings Committee II on Vascular Findings. [HOSPITAL] [R4]; [DATE]. Aortic aneurysm NOS ([R4]-[R4]).

2. The symptomatic level with regard to pain radiating to the right
might be either L2-L3 or L5-S1.
- L2-L3 right subarticular disc herniation with mild to moderate
right lateral recess stenosis. Query right L3 radiculitis.
- L5-S1 chronic disc and endplate degeneration eccentric to the
right with up to mild foraminal and lateral recess stenosis. Query
right L5 and/or S1 radiculitis.

3. Mild for age visible spine degeneration otherwise.

## 2019-04-08 ENCOUNTER — Other Ambulatory Visit: Payer: Self-pay | Admitting: Family Medicine

## 2019-04-08 DIAGNOSIS — Z1231 Encounter for screening mammogram for malignant neoplasm of breast: Secondary | ICD-10-CM

## 2020-07-19 ENCOUNTER — Other Ambulatory Visit: Payer: Self-pay | Admitting: Family Medicine

## 2020-07-19 DIAGNOSIS — Z1231 Encounter for screening mammogram for malignant neoplasm of breast: Secondary | ICD-10-CM

## 2020-08-04 ENCOUNTER — Emergency Department: Payer: Medicare (Managed Care)

## 2020-08-04 ENCOUNTER — Inpatient Hospital Stay
Admission: EM | Admit: 2020-08-04 | Discharge: 2020-08-07 | DRG: 438 | Disposition: A | Discharge status: Home / Self Care | Payer: Medicare (Managed Care) | Attending: Internal Medicine | Admitting: Internal Medicine

## 2020-08-04 ENCOUNTER — Other Ambulatory Visit: Payer: Self-pay

## 2020-08-04 DIAGNOSIS — Z79899 Other long term (current) drug therapy: Secondary | ICD-10-CM

## 2020-08-04 DIAGNOSIS — F32A Depression, unspecified: Secondary | ICD-10-CM | POA: Diagnosis present

## 2020-08-04 DIAGNOSIS — E785 Hyperlipidemia, unspecified: Secondary | ICD-10-CM | POA: Diagnosis present

## 2020-08-04 DIAGNOSIS — Z7951 Long term (current) use of inhaled steroids: Secondary | ICD-10-CM | POA: Diagnosis not present

## 2020-08-04 DIAGNOSIS — R748 Abnormal levels of other serum enzymes: Secondary | ICD-10-CM

## 2020-08-04 DIAGNOSIS — Z9049 Acquired absence of other specified parts of digestive tract: Secondary | ICD-10-CM | POA: Diagnosis not present

## 2020-08-04 DIAGNOSIS — K831 Obstruction of bile duct: Secondary | ICD-10-CM | POA: Diagnosis present

## 2020-08-04 DIAGNOSIS — J449 Chronic obstructive pulmonary disease, unspecified: Secondary | ICD-10-CM | POA: Diagnosis present

## 2020-08-04 DIAGNOSIS — G8929 Other chronic pain: Secondary | ICD-10-CM | POA: Diagnosis present

## 2020-08-04 DIAGNOSIS — I714 Abdominal aortic aneurysm, without rupture, unspecified: Secondary | ICD-10-CM | POA: Diagnosis present

## 2020-08-04 DIAGNOSIS — R7401 Elevation of levels of liver transaminase levels: Secondary | ICD-10-CM

## 2020-08-04 DIAGNOSIS — Z85828 Personal history of other malignant neoplasm of skin: Secondary | ICD-10-CM

## 2020-08-04 DIAGNOSIS — K838 Other specified diseases of biliary tract: Secondary | ICD-10-CM | POA: Diagnosis present

## 2020-08-04 DIAGNOSIS — I10 Essential (primary) hypertension: Secondary | ICD-10-CM | POA: Diagnosis present

## 2020-08-04 DIAGNOSIS — Z951 Presence of aortocoronary bypass graft: Secondary | ICD-10-CM | POA: Diagnosis not present

## 2020-08-04 DIAGNOSIS — I739 Peripheral vascular disease, unspecified: Secondary | ICD-10-CM | POA: Diagnosis present

## 2020-08-04 DIAGNOSIS — K8689 Other specified diseases of pancreas: Principal | ICD-10-CM | POA: Diagnosis present

## 2020-08-04 DIAGNOSIS — R109 Unspecified abdominal pain: Secondary | ICD-10-CM | POA: Diagnosis present

## 2020-08-04 DIAGNOSIS — K219 Gastro-esophageal reflux disease without esophagitis: Secondary | ICD-10-CM | POA: Diagnosis present

## 2020-08-04 DIAGNOSIS — Z87891 Personal history of nicotine dependence: Secondary | ICD-10-CM | POA: Diagnosis not present

## 2020-08-04 DIAGNOSIS — R1084 Generalized abdominal pain: Secondary | ICD-10-CM

## 2020-08-04 DIAGNOSIS — Z20822 Contact with and (suspected) exposure to covid-19: Secondary | ICD-10-CM | POA: Diagnosis present

## 2020-08-04 LAB — CBC
HCT: 46.8 % — ABNORMAL HIGH (ref 36.0–46.0)
Hemoglobin: 15.2 g/dL — ABNORMAL HIGH (ref 12.0–15.0)
MCH: 29.9 pg (ref 26.0–34.0)
MCHC: 32.5 g/dL (ref 30.0–36.0)
MCV: 92.1 fL (ref 80.0–100.0)
Platelets: 228 10*3/uL (ref 150–400)
RBC: 5.08 MIL/uL (ref 3.87–5.11)
RDW: 12.8 % (ref 11.5–15.5)
WBC: 6.7 10*3/uL (ref 4.0–10.5)
nRBC: 0 % (ref 0.0–0.2)

## 2020-08-04 LAB — URINALYSIS, COMPLETE (UACMP) WITH MICROSCOPIC
Glucose, UA: NEGATIVE mg/dL
Ketones, ur: 5 mg/dL — AB
Leukocytes,Ua: NEGATIVE
Nitrite: NEGATIVE
Protein, ur: NEGATIVE mg/dL
Specific Gravity, Urine: >1.046 — ABNORMAL HIGH (ref 1.005–1.030)
WBC, UA: NONE SEEN WBC/hpf (ref 0–5)
pH: 5 (ref 5.0–8.0)

## 2020-08-04 LAB — TROPONIN I (HIGH SENSITIVITY)
Troponin I (High Sensitivity): 3 ng/L (ref <18)
Troponin I (High Sensitivity): 3 ng/L (ref <18)

## 2020-08-04 LAB — COMPREHENSIVE METABOLIC PANEL
ALT: 398 U/L — ABNORMAL HIGH (ref 0–44)
AST: 155 U/L — ABNORMAL HIGH (ref 15–41)
Albumin: 4.2 g/dL (ref 3.5–5.0)
Alkaline Phosphatase: 205 U/L — ABNORMAL HIGH (ref 38–126)
Anion gap: 12 (ref 5–15)
BUN: <5 mg/dL — ABNORMAL LOW (ref 8–23)
CO2: 26 mmol/L (ref 22–32)
Calcium: 9.9 mg/dL (ref 8.9–10.3)
Chloride: 100 mmol/L (ref 98–111)
Creatinine, Ser: 0.52 mg/dL (ref 0.44–1.00)
GFR, Estimated: >60 mL/min (ref >60)
Glucose, Bld: 104 mg/dL — ABNORMAL HIGH (ref 70–99)
Potassium: 3.8 mmol/L (ref 3.5–5.1)
Sodium: 138 mmol/L (ref 135–145)
Total Bilirubin: 5.6 mg/dL — ABNORMAL HIGH (ref 0.3–1.2)
Total Protein: 7.8 g/dL (ref 6.5–8.1)

## 2020-08-04 LAB — CK: Total CK: 49 U/L (ref 38–234)

## 2020-08-04 LAB — RESP PANEL BY RT-PCR (FLU A&B, COVID) ARPGX2
Influenza A by PCR: NEGATIVE
Influenza B by PCR: NEGATIVE
SARS Coronavirus 2 by RT PCR: NEGATIVE

## 2020-08-04 LAB — ACETAMINOPHEN LEVEL: Acetaminophen (Tylenol), Serum: <10 ug/mL — ABNORMAL LOW (ref 10–30)

## 2020-08-04 LAB — LIPASE, BLOOD: Lipase: 94 U/L — ABNORMAL HIGH (ref 11–51)

## 2020-08-04 LAB — PROTIME-INR
INR: 0.9 (ref 0.8–1.2)
Prothrombin Time: 12.3 seconds (ref 11.4–15.2)

## 2020-08-04 LAB — BILIRUBIN, DIRECT: Bilirubin, Direct: 3.7 mg/dL — ABNORMAL HIGH (ref 0.0–0.2)

## 2020-08-04 IMAGING — MR MR 3D RECON AT SCANNER
2 series · 12 of 16 positions shown · IV contrast (gadavist)
Comparison: CT [DATE], [DATE]

CLINICAL DATA: [age] female with intra and extrahepatic
biliary duct dilatation. Postcholecystectomy.



[Series 15: MRCP · coronal · 1.2mm · 0.49mm/px · 10 of 48 slices shown (1 of 2)]
[im 1/48]
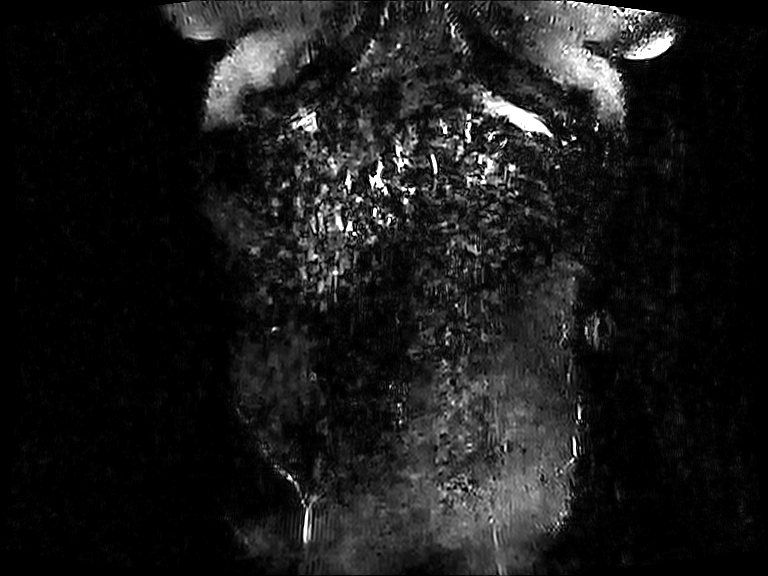
[im 8/48]
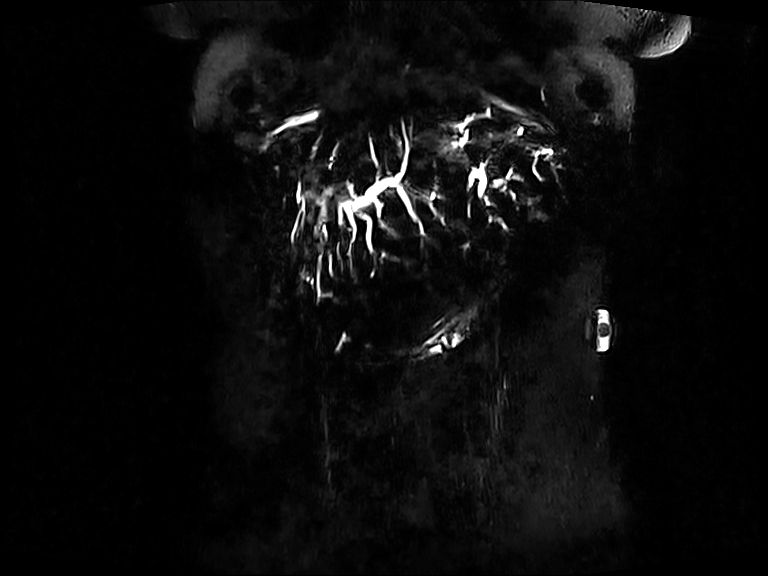
[im 11/48]
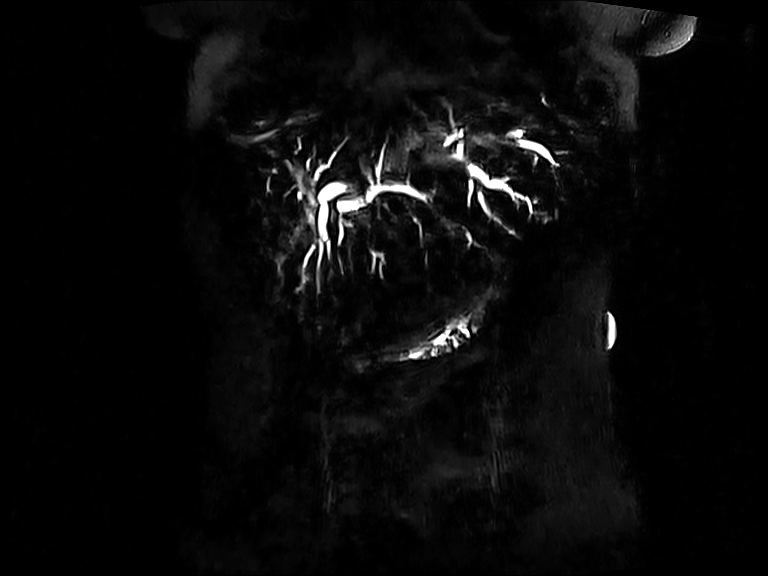
[im 15/48]
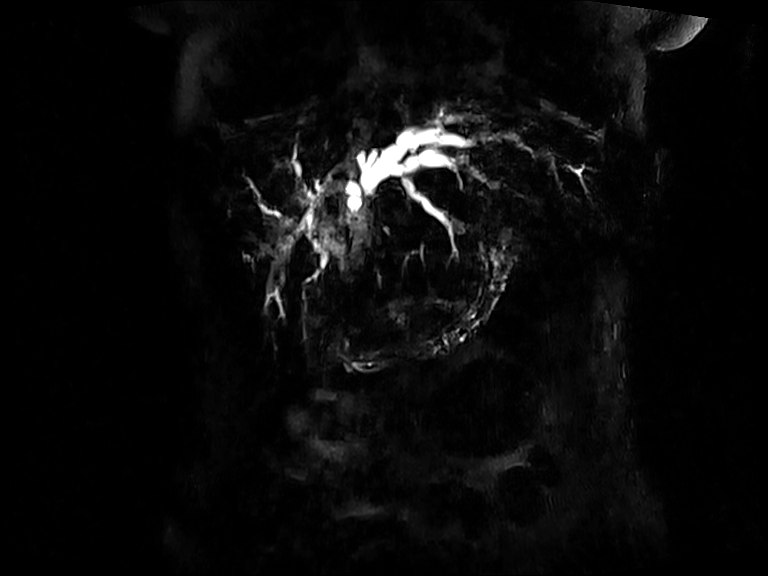
[im 22/48]
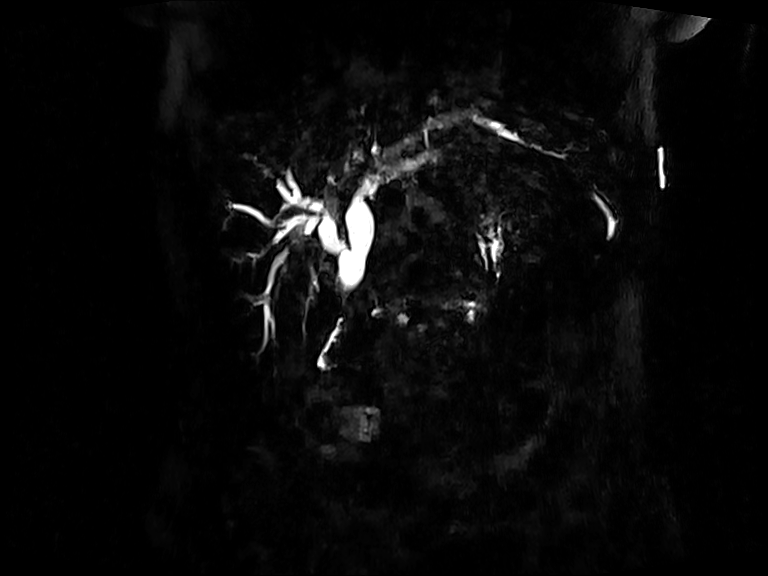
[im 26/48]
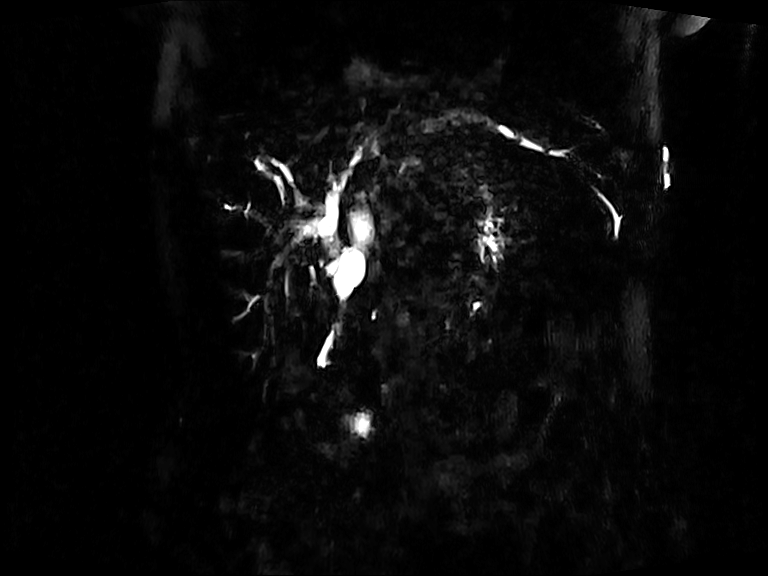
[im 29/48]
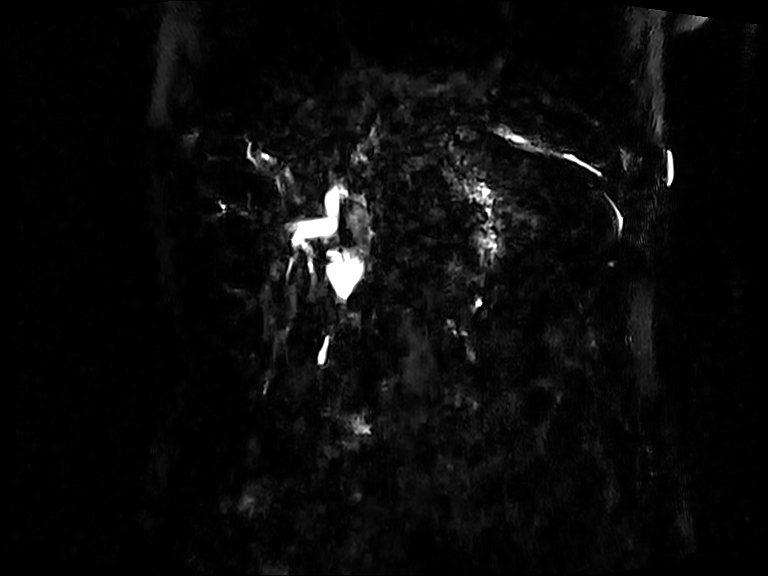
[im 37/48]
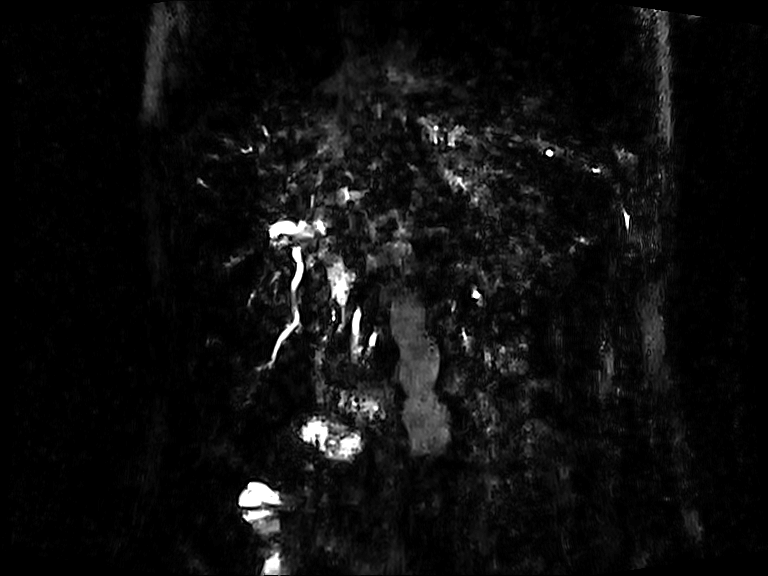
[im 40/48]
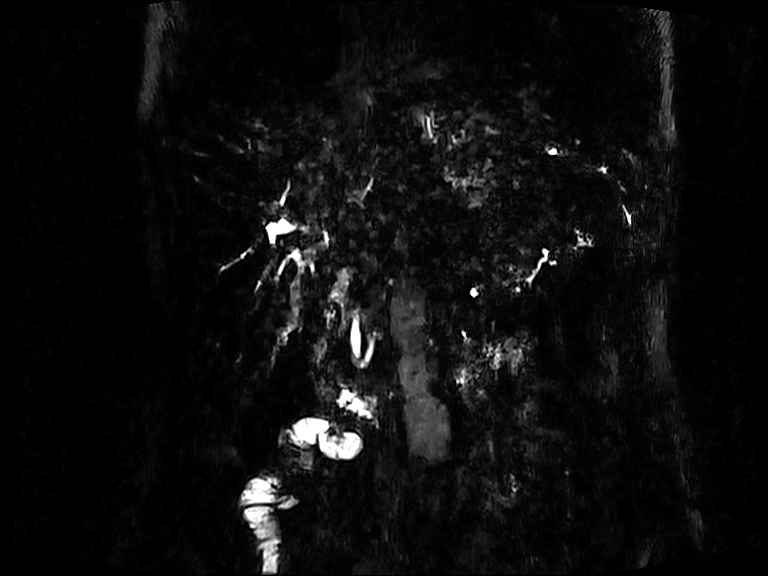
[im 44/48]
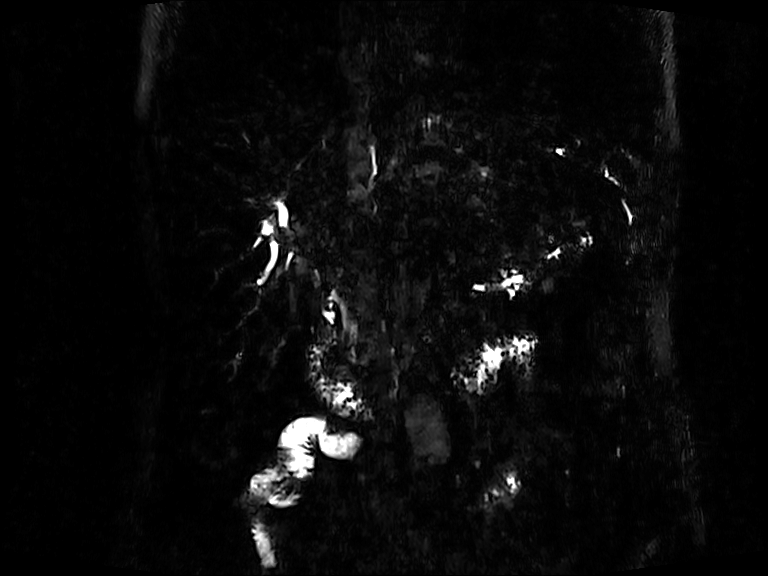

[Series 1054: MRCP · sagittal · 0.5mm · 0.47mm/px · 2 of 7 slices shown (2 of 2)]
[im 1/7]
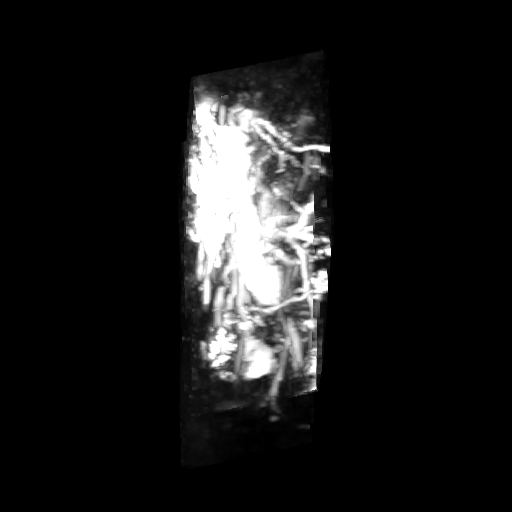
[im 7/7]
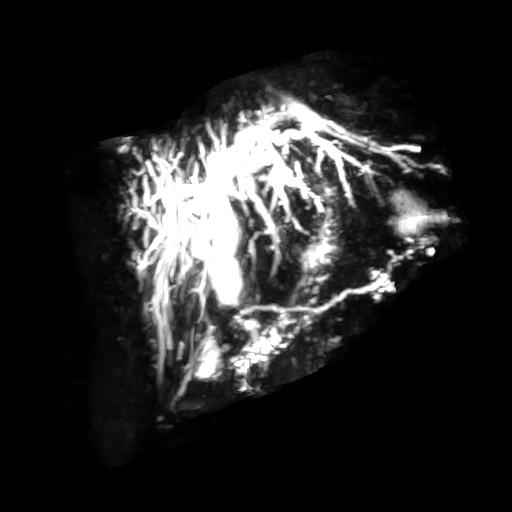

[12 of 16 positions shown; findings below may reference images not displayed]

FINDINGS: Lower chest:  Lung bases clear

Hepatobiliary:

Marked intra and extrahepatic biliary duct dilatation. Extrahepatic
bile ducts measure up to 13 mm. Common bile duct measures up to 13
mm. No filling defect within the common bile duct.

There is abrupt narrowing of the distal common bile duct with near
complete loss of the lumen over a 7 mm segment (image 10/MRCP MIP
sequence). The most distal common bile duct reconstitutes to normal
caliber (3 mm) over a 26 mm segment leading up to the ampulla. This
stricturing is seen on coronal postcontrast T1 weighted imaging
(image 40/25).

There is enhancing a lesion the RIGHT hepatic lobe (image [DATE])
appears to have a vascular pattern. Lesion is subtly evident on more
delayed imaging (series 21). Lesion fades to isointense on the 90
second imaging. No additional enhancing hepatic lesions.

Pancreas: Within the lateral aspect of the pancreatic head, there is
a subtle hypointensity on noncontrast T1 weighted imaging measuring
10 mm x 10 mm on image 41/series 18. There is mild pancreatic duct
dilatation proximal to this lesion on same image. The duct is lost
through this region. On the delayed vascular imaging this same
region is more difficult to define. There subtle ductal interruption
in the same vicinity of the stricturing of the common bile duct.
(Image 43/24 )

No periportal or peripancreatic lymphadenopathy.

Spleen: Normal spleen.

Adrenals/urinary tract: Adrenal glands and kidneys are normal.

Stomach/Bowel: Stomach and limited of the small bowel is
unremarkable

Vascular/Lymphatic: Abdominal aortic normal caliber. No
retroperitoneal periportal lymphadenopathy.

Musculoskeletal: No aggressive osseous lesion
IMPRESSION: 1. Abrupt stricturing of the distal common bile duct with
reconstitution through the head of the pancreas. Differential would
include benign and malignant stricturing of the distal common bile
duct versus is external mass lesion constricting the duct. Benign
stricturing would include inflammation versus cholangiocarcinoma.
External compression differential would include pancreatic
adenocarcinoma.
2. Subtle lesion in the head of the pancreas. Subtle interruption of
the pancreatic duct through the head of the pancreas. These findings
favor a pancreatic neoplasm (adenocarcinoma) as source of ductal
obstruction.
3. Enhancing lesion the RIGHT hepatic lobe is favored benign
vascular lesion.
4. No lymphadenopathy identified.
5. Recommend endoscopic ultrasound for evaluation of the pancreatic
head / distal common bile duct.

## 2020-08-04 IMAGING — CT CT ABD-PELV W/ CM
2 of 5 series · 14 of 46 positions shown, 16 images · IV contrast (APPLIED)
Comparison: [DATE]

CLINICAL DATA: Abdominal pain with nausea and vomiting

EXAM:
CT ABDOMEN AND PELVIS WITH CONTRAST
TECHNIQUE: Multidetector CT imaging of the abdomen and pelvis was performed
using the standard protocol following bolus administration of
intravenous contrast.
CONTRAST:  75mL OMNIPAQUE IOHEXOL 300 MG/ML  SOLN

[Series 2: routine abd/pel with · axial · 0.66mm/px · z∈[-973,-613]mm · 11 of 82 slices shown, 13 images]
[im 5/82  soft-tissue]
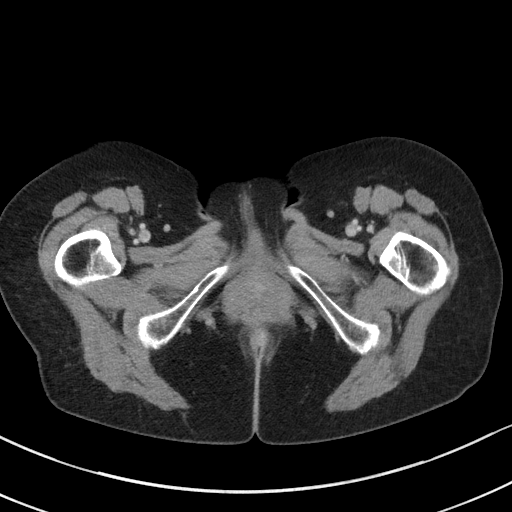
[im 5/82  bone]
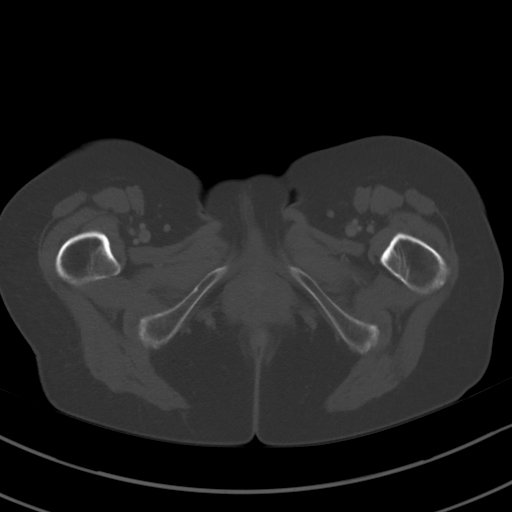
[im 15/82  soft-tissue]
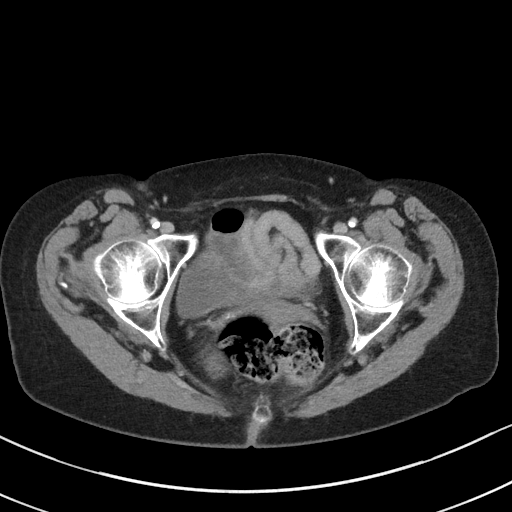
[im 20/82  soft-tissue]
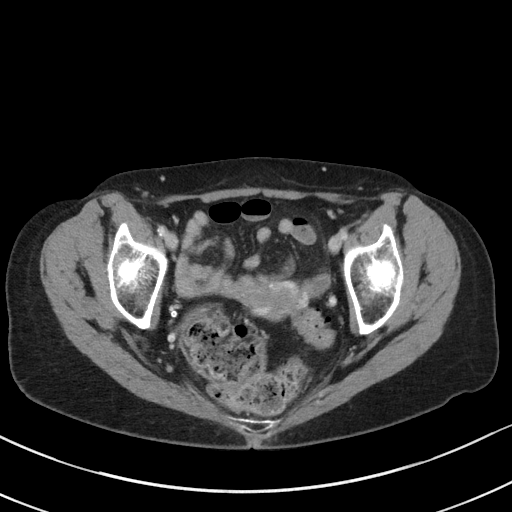
[im 29/82  soft-tissue]
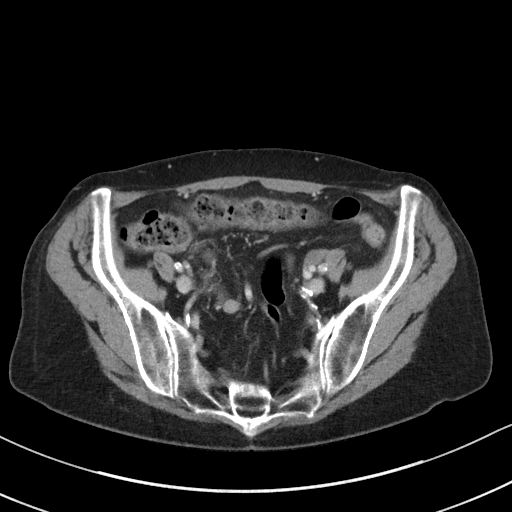
[im 34/82  soft-tissue]
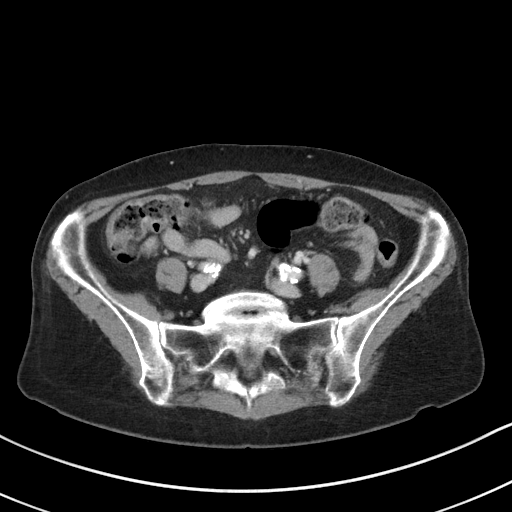
[im 43/82  soft-tissue]
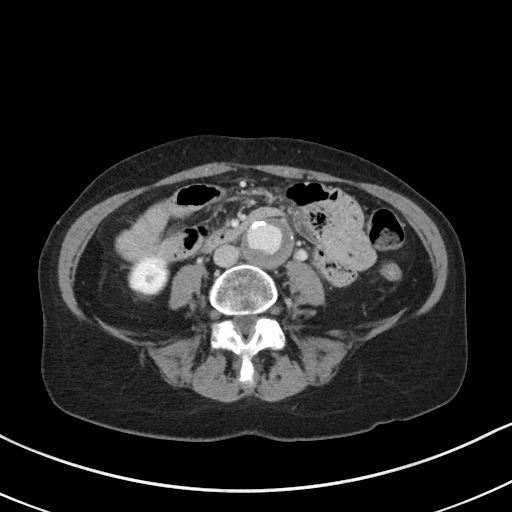
[im 48/82  soft-tissue]
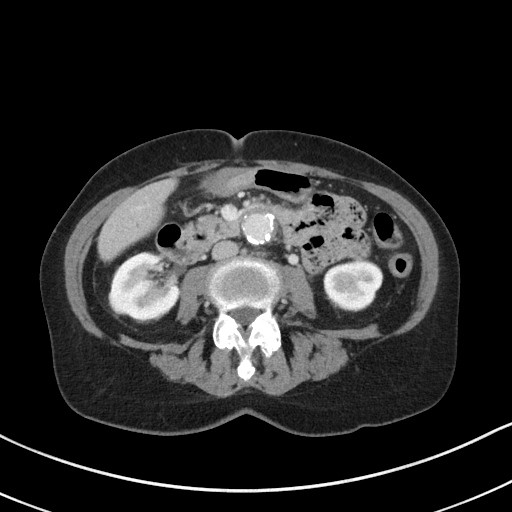
[im 53/82  soft-tissue]
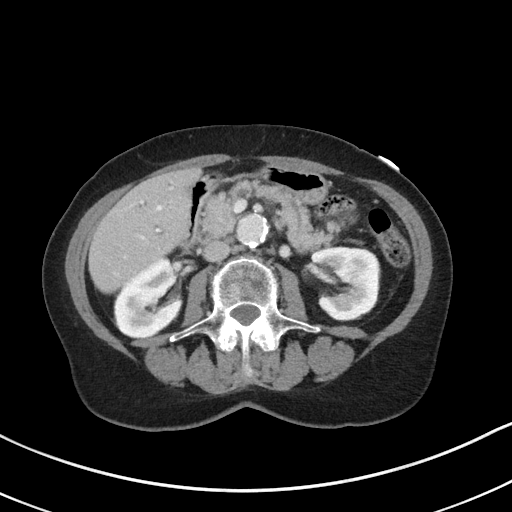
[im 62/82  soft-tissue]
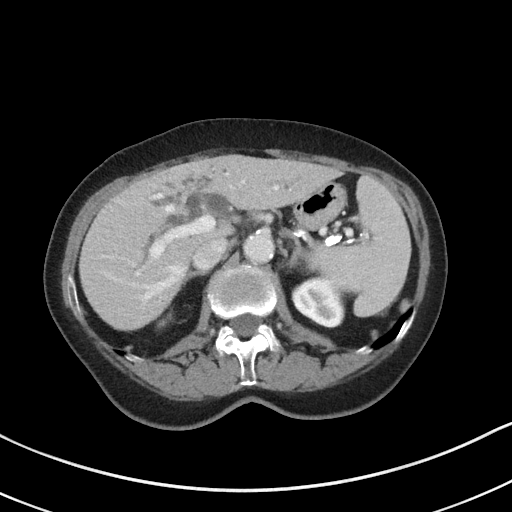
[im 62/82  bone]
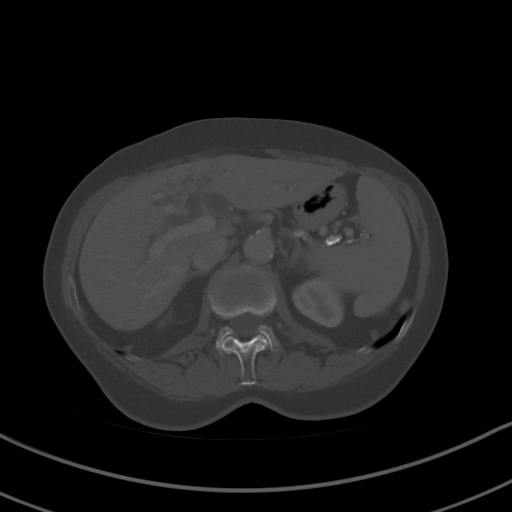
[im 67/82  soft-tissue]
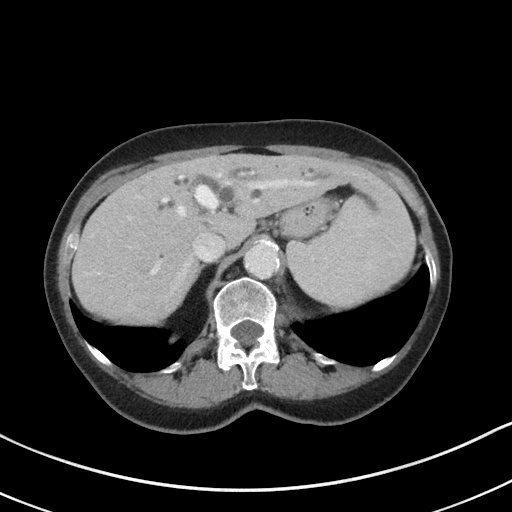
[im 77/82  soft-tissue]
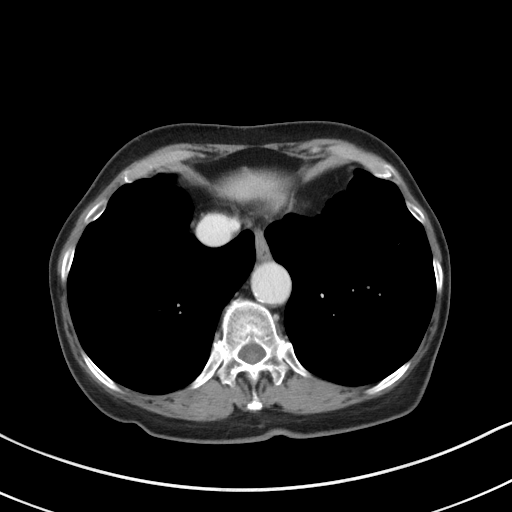

[Series 5: coronal st · coronal · 0.66mm/px · 3 of 75 slices shown]
[im 25/75  soft-tissue]
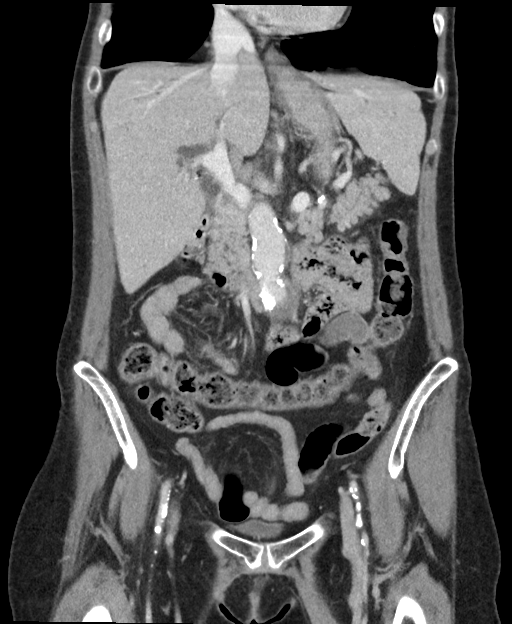
[im 33/75  soft-tissue]
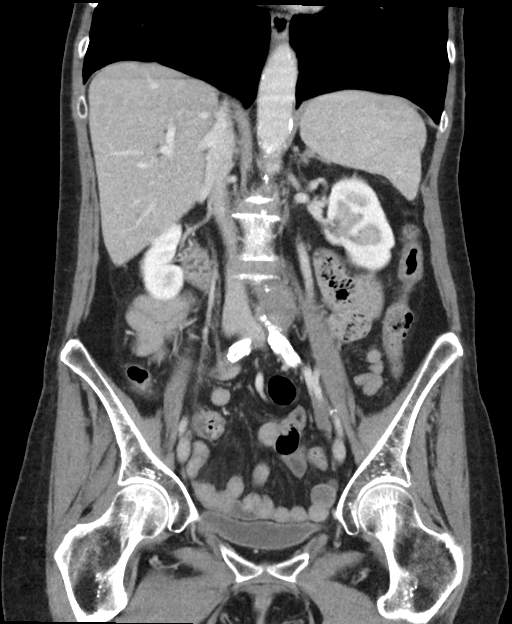
[im 42/75  soft-tissue]
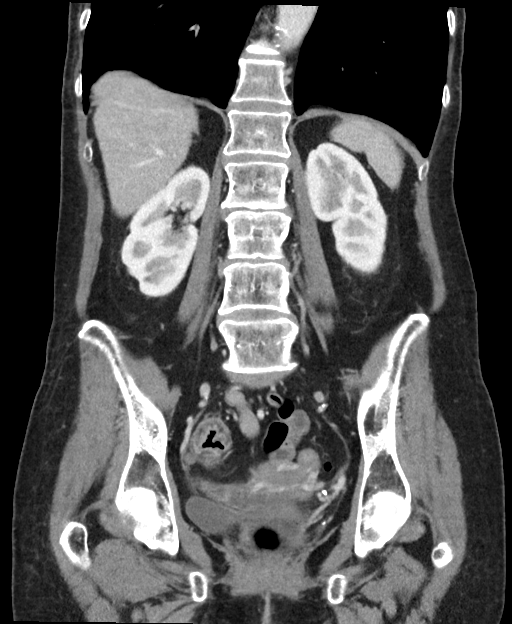

[14 of 46 positions shown; findings below may reference images not displayed]

FINDINGS: Lower chest: No lung base edema or consolidation evident. Suggestion
of bibasilar underlying emphysematous change.

Hepatobiliary: No focal liver lesions are evident. Gallbladder is
absent. There is generalized intrahepatic biliary duct dilatation.
There is dilatation of the common hepatic and common bile ducts to
the level of the ampulla. There is mild enhancement at the level of
the ampulla without well-defined mass appreciable.

Pancreas: There is pancreatic duct dilatation measuring just under 5
mm at its maximum. No well-defined pancreatic mass is seen by CT. No
peripancreatic fluid.

Spleen: No splenic lesions are evident.

Adrenals/Urinary Tract: Adrenals bilaterally appear normal. There is
a 4 mm apparent cyst in the lower pole right kidney. No evident
hydronephrosis on either side. Extrarenal pelvis on each side, an
anatomic variant. No appreciable renal or ureteral calculus on
either side. Urinary bladder is midline with wall thickness within
normal limits.

Stomach/Bowel: Moderate stool throughout colon noted. There is wall
thickening in the gastric antrum. There is no appreciable small or
large bowel wall thickening. No evident bowel obstruction. Terminal
ileum appears normal. No evident appendiceal inflammation. No free
air or portal venous air.

Vascular/Lymphatic: There is aneurysmal dilatation of the distal
abdominal aorta extending to the bifurcation with a maximum
transverse diameter of 3.4 x 3.3 cm. There is thrombus in the
periphery of this aneurysmal dilatation. There are stents in each
common iliac artery. Extensive aortic and pelvic arterial
atherosclerotic calcification noted. Calcification noted in each
proximal renal artery. Major venous structures appear patent. No
evident adenopathy in the abdomen or pelvis.

Reproductive: Uterus is anteverted.  No adnexal masses evident.

Other: No appreciable abscess or ascites in the abdomen or pelvis.

Musculoskeletal: No blastic or lytic bone lesions. There are
degenerative changes in the lumbar spine. No appreciable abdominal
wall or intramuscular lesions.
IMPRESSION: 1. Intrahepatic and extrahepatic biliary duct dilatation which is
greater than is expected with cholecystectomy state. Mild
enhancement is noted at the ampulla. There is also a degree of
pancreatic duct dilatation. These findings raise concern for
potential neoplastic lesion at or near the ampulla. A well-defined
mass is not appreciated by CT. These findings are sufficiently
suspicious for potential lesion in this area to warrant correlation
with abdominal MR pre and post-contrast as well as MRCP to further
evaluate. Gallbladder is absent.

2. Wall thickening in the gastric antrum concerning for gastritis in
this area. No other bowel wall thickening. No bowel obstruction. No
abscess in the abdomen or pelvis. Appendix region appears
unremarkable.

3. Abdominal aortic aneurysm with maximum transverse diameter of
x 3.3 cm. Recommend follow-up ultrasound every 3 years. This
recommendation follows ACR consensus guidelines: White Paper of the
ACR Incidental Findings Committee II on Vascular Findings. [HOSPITAL] [TB]; [DATE]. thrombus noted in the periphery of this
aneurysm. There are stents in each iliac artery. There is aortic and
multifocal pelvic arterial vascular calcification.

4. No renal or ureteral calculus. No hydronephrosis. Urinary bladder
wall thickness normal.

## 2020-08-04 IMAGING — MR MR ABDOMEN WO/W CM MRCP
17 of 20 series · 42 of 48 positions shown · IV contrast (gadavist)
Comparison: CT [DATE], [DATE]

CLINICAL DATA: [age] female with intra and extrahepatic
biliary duct dilatation. Postcholecystectomy.



[Series 3: T2 · coronal · 6.0mm · 1.09mm/px · 2 of 27 slices shown (1 of 2)]
[im 1/27]
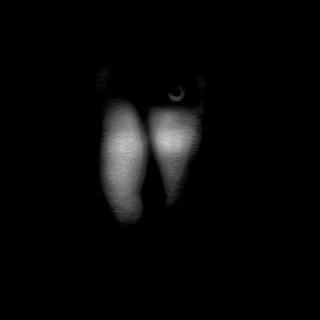
[im 27/27]
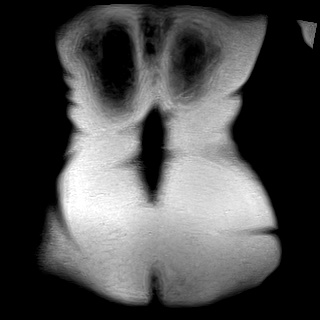

[Series 4: T2 · axial · 6.0mm · 1.09mm/px · z∈[-221,+16]mm · 2 of 34 slices shown (2 of 2)]
[im 1/34]
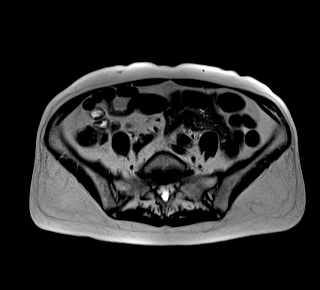
[im 34/34]
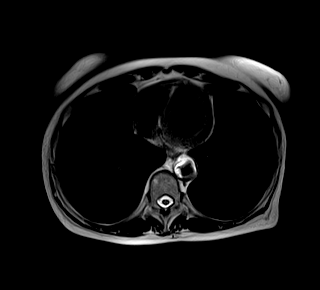

[Series 5: T1 · axial · 6.0mm · 0.68mm/px · z∈[-221,+16]mm · 2 of 34 slices shown (1 of 2)]
[im 1/34]
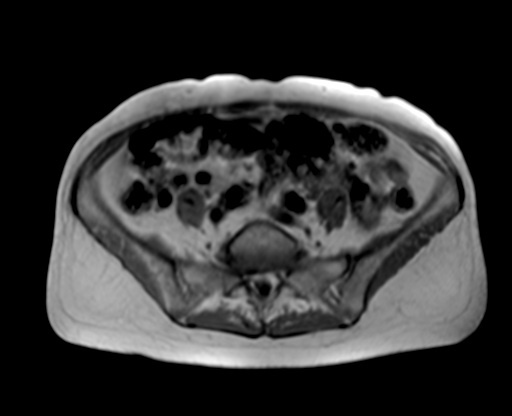
[im 34/34]
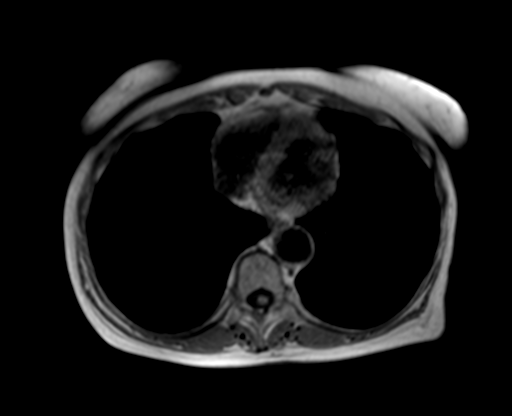

[Series 5: T1 · axial · 6.0mm · 0.68mm/px · z∈[-221,+16]mm · 2 of 34 slices shown (2 of 2)]
[im 1/34]
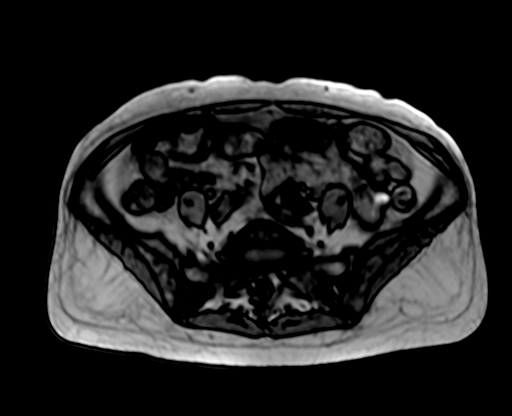
[im 34/34]
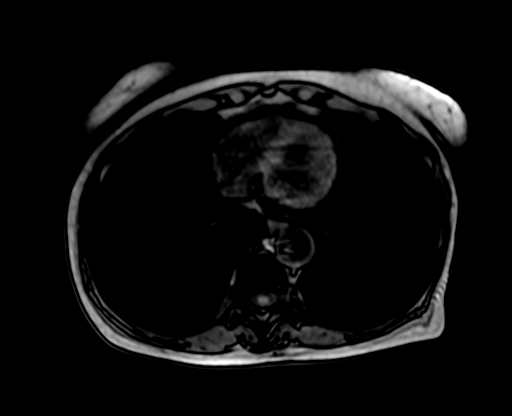

[Series 8: T2 fat-sat · axial · 6.0mm · 1.09mm/px · 1 of 32 slices shown]
[im 1/32]
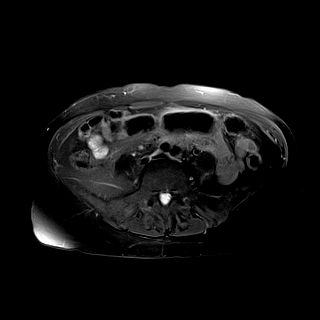

[Series 13: ax dwi_tracew · axial · 6.0mm · 1.42mm/px · z∈[-203,+20]mm · 4 of 94 slices shown]
[im 1/94]
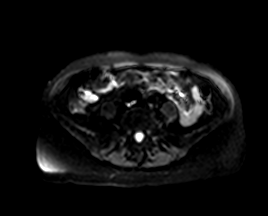
[im 32/94]
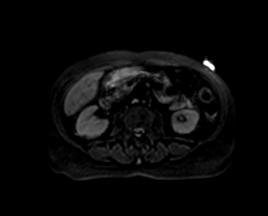
[im 63/94]
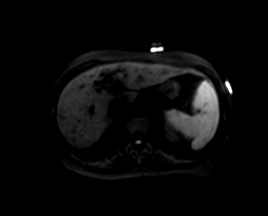
[im 94/94]
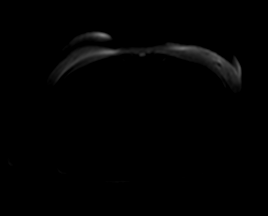

[Series 14: ax dwi_adc · axial · 6.0mm · 1.42mm/px · 1 of 32 slices shown]
[im 1/32]
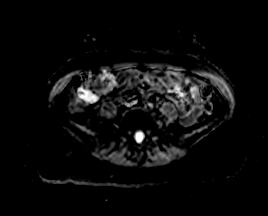

[Series 15: MRCP · coronal · 3.0mm · 1.12mm/px · 1 of 17 slices shown]
[im 1/17]
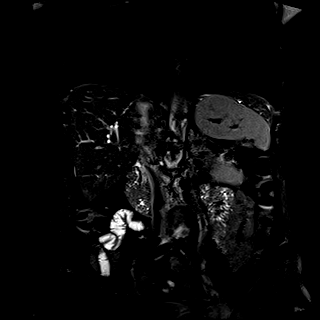

[Series 18: T1 dynamic fat-sat · axial · non-contrast · 3.0mm · 1.09mm/px · z∈[-209,+4]mm · 3 of 72 slices shown (1 of 4)]
[im 1/72]
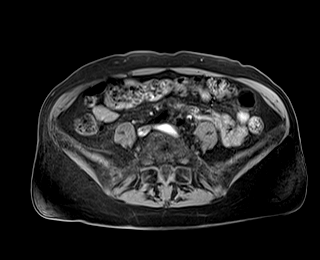
[im 36/72]
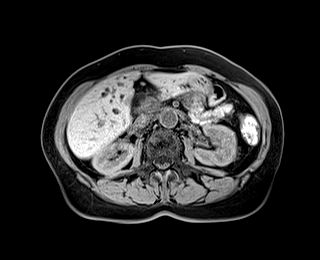
[im 72/72]
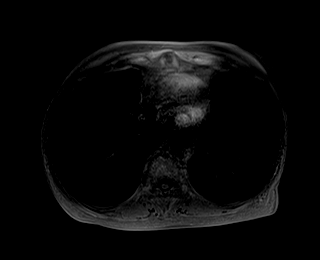

[Series 19: T1 dynamic fat-sat post-contrast · axial · 3.0mm · 1.09mm/px · z∈[-209,+4]mm · 3 of 72 slices shown (1 of 4)]
[im 1/72]
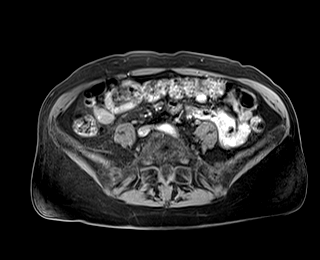
[im 36/72]
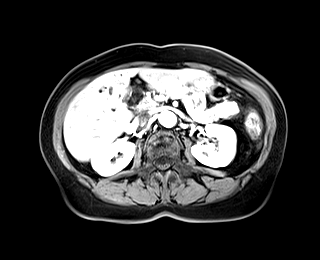
[im 72/72]
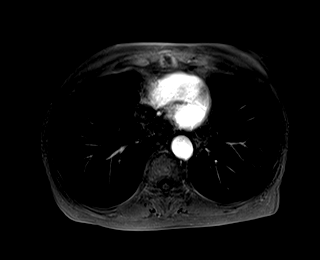

[Series 20: T1 dynamic fat-sat · axial · 3.0mm · 1.09mm/px · z∈[-209,+4]mm · 3 of 72 slices shown (2 of 4)]
[im 1/72]
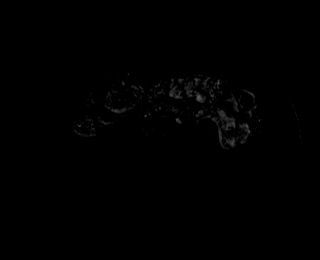
[im 36/72]
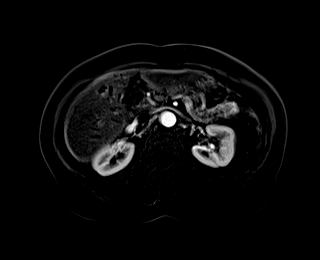
[im 72/72]
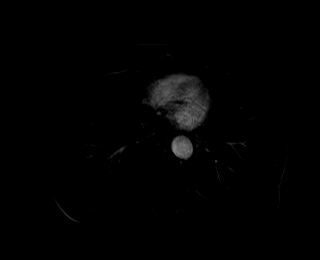

[Series 21: T1 dynamic fat-sat post-contrast · axial · 3.0mm · 1.09mm/px · z∈[-209,+4]mm · 3 of 72 slices shown (2 of 4)]
[im 1/72]
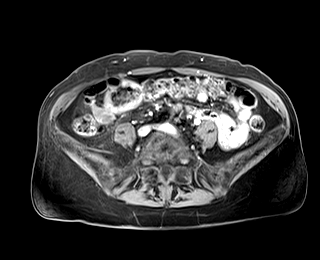
[im 36/72]
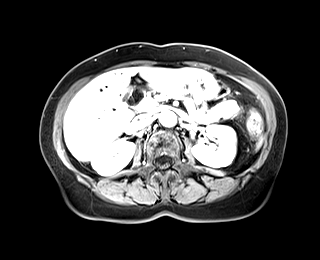
[im 72/72]
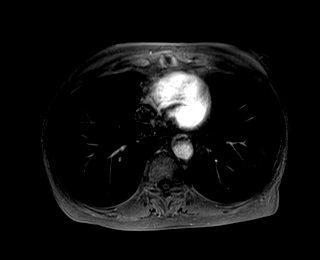

[Series 23: T1 dynamic fat-sat post-contrast · axial · 3.0mm · 1.09mm/px · z∈[-209,+4]mm · 3 of 72 slices shown (3 of 4)]
[im 1/72]
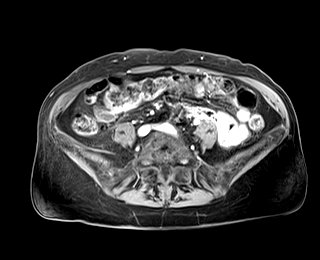
[im 36/72]
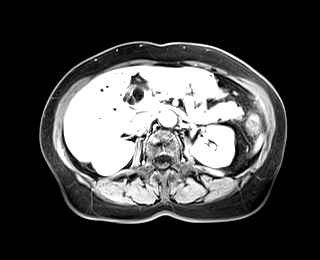
[im 72/72]
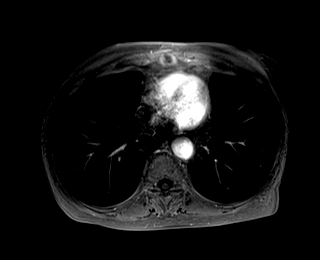

[Series 24: T1 dynamic fat-sat · axial · 3.0mm · 1.09mm/px · z∈[-209,+4]mm · 3 of 72 slices shown (3 of 4)]
[im 1/72]
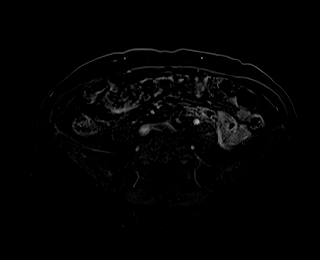
[im 36/72]
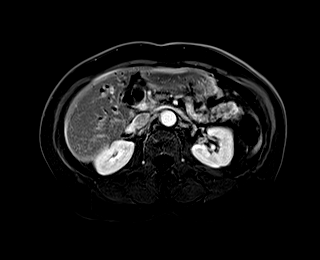
[im 72/72]
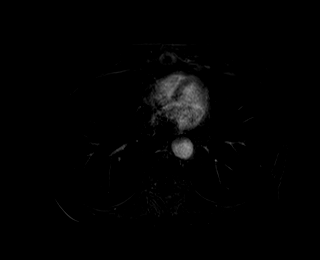

[Series 25: T1 dynamic post-contrast · coronal · 3.0mm · 1.03mm/px · 3 of 64 slices shown]
[im 1/64]
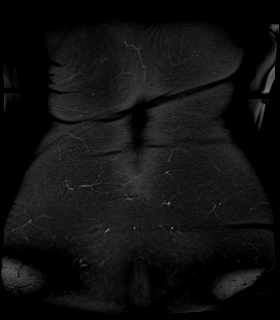
[im 32/64]
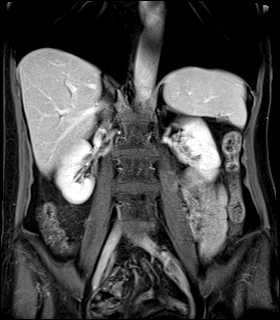
[im 64/64]
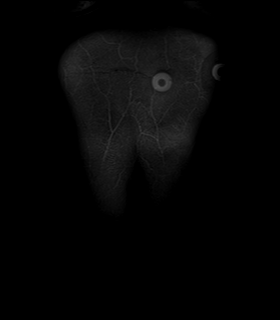

[Series 26: T1 dynamic fat-sat post-contrast · axial · 3.0mm · 1.09mm/px · z∈[-209,+4]mm · 3 of 72 slices shown (4 of 4)]
[im 1/72]
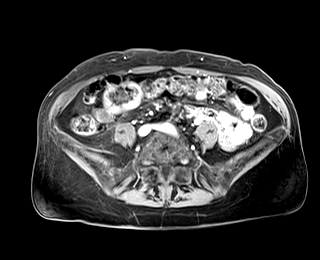
[im 36/72]
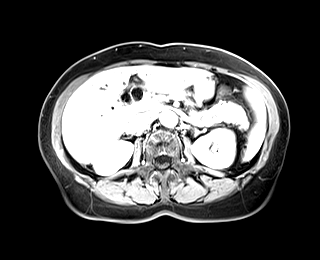
[im 72/72]
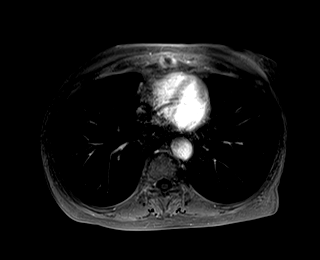

[Series 27: T1 dynamic fat-sat · axial · 3.0mm · 1.09mm/px · z∈[-209,+4]mm · 3 of 72 slices shown (4 of 4)]
[im 1/72]
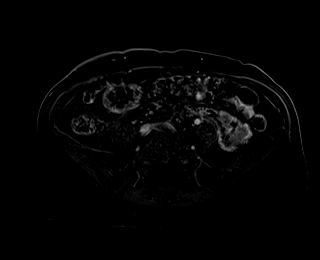
[im 36/72]
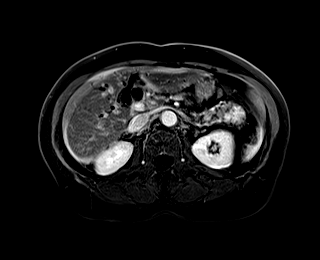
[im 72/72]
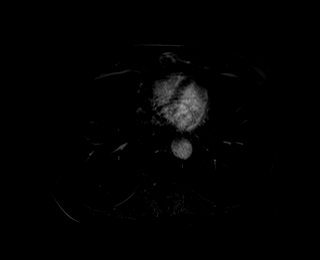

[42 of 48 positions shown; findings below may reference images not displayed]

FINDINGS: Lower chest:  Lung bases clear

Hepatobiliary:

Marked intra and extrahepatic biliary duct dilatation. Extrahepatic
bile ducts measure up to 13 mm. Common bile duct measures up to 13
mm. No filling defect within the common bile duct.

There is abrupt narrowing of the distal common bile duct with near
complete loss of the lumen over a 7 mm segment (image 10/MRCP MIP
sequence). The most distal common bile duct reconstitutes to normal
caliber (3 mm) over a 26 mm segment leading up to the ampulla. This
stricturing is seen on coronal postcontrast T1 weighted imaging
(image 40/25).

There is enhancing a lesion the RIGHT hepatic lobe (image [DATE])
appears to have a vascular pattern. Lesion is subtly evident on more
delayed imaging (series 21). Lesion fades to isointense on the 90
second imaging. No additional enhancing hepatic lesions.

Pancreas: Within the lateral aspect of the pancreatic head, there is
a subtle hypointensity on noncontrast T1 weighted imaging measuring
10 mm x 10 mm on image 41/series 18. There is mild pancreatic duct
dilatation proximal to this lesion on same image. The duct is lost
through this region. On the delayed vascular imaging this same
region is more difficult to define. There subtle ductal interruption
in the same vicinity of the stricturing of the common bile duct.
(Image 43/24 )

No periportal or peripancreatic lymphadenopathy.

Spleen: Normal spleen.

Adrenals/urinary tract: Adrenal glands and kidneys are normal.

Stomach/Bowel: Stomach and limited of the small bowel is
unremarkable

Vascular/Lymphatic: Abdominal aortic normal caliber. No
retroperitoneal periportal lymphadenopathy.

Musculoskeletal: No aggressive osseous lesion
IMPRESSION: 1. Abrupt stricturing of the distal common bile duct with
reconstitution through the head of the pancreas. Differential would
include benign and malignant stricturing of the distal common bile
duct versus is external mass lesion constricting the duct. Benign
stricturing would include inflammation versus cholangiocarcinoma.
External compression differential would include pancreatic
adenocarcinoma.
2. Subtle lesion in the head of the pancreas. Subtle interruption of
the pancreatic duct through the head of the pancreas. These findings
favor a pancreatic neoplasm (adenocarcinoma) as source of ductal
obstruction.
3. Enhancing lesion the RIGHT hepatic lobe is favored benign
vascular lesion.
4. No lymphadenopathy identified.
5. Recommend endoscopic ultrasound for evaluation of the pancreatic
head / distal common bile duct.

## 2020-08-04 MED ORDER — LACTATED RINGERS IV BOLUS
1000.0000 mL | Freq: Once | INTRAVENOUS | Status: AC
  Administered 2020-08-04: 1000 mL via INTRAVENOUS

## 2020-08-04 MED ORDER — OXYCODONE HCL 5 MG PO TABS
5.0000 mg | ORAL_TABLET | ORAL | Status: DC | PRN
  Administered 2020-08-05 (×2): 5 mg via ORAL
  Filled 2020-08-04 (×2): qty 1

## 2020-08-04 MED ORDER — HYDRALAZINE HCL 20 MG/ML IJ SOLN: 5.0000 mg | INTRAMUSCULAR | Status: DC | PRN

## 2020-08-04 MED ORDER — ALBUTEROL SULFATE HFA 108 (90 BASE) MCG/ACT IN AERS
2.0000 | INHALATION_SPRAY | RESPIRATORY_TRACT | Status: DC | PRN
  Filled 2020-08-04: qty 6.7

## 2020-08-04 MED ORDER — LACTATED RINGERS IV SOLN: INTRAVENOUS | Status: DC

## 2020-08-04 MED ORDER — MORPHINE SULFATE (PF) 2 MG/ML IV SOLN
2.0000 mg | INTRAVENOUS | Status: DC | PRN
  Administered 2020-08-04 – 2020-08-07 (×6): 2 mg via INTRAVENOUS
  Filled 2020-08-04 (×6): qty 1

## 2020-08-04 MED ORDER — DM-GUAIFENESIN ER 30-600 MG PO TB12: 1.0000 | ORAL_TABLET | Freq: Two times a day (BID) | ORAL | Status: DC | PRN

## 2020-08-04 MED ORDER — FENTANYL CITRATE (PF) 100 MCG/2ML IJ SOLN
50.0000 ug | Freq: Once | INTRAMUSCULAR | Status: AC
  Administered 2020-08-04: 50 ug via INTRAVENOUS
  Filled 2020-08-04: qty 2

## 2020-08-04 MED ORDER — IOHEXOL 300 MG/ML  SOLN
75.0000 mL | Freq: Once | INTRAMUSCULAR | Status: AC | PRN
  Administered 2020-08-04: 75 mL via INTRAVENOUS
  Filled 2020-08-04: qty 75

## 2020-08-04 MED ORDER — ONDANSETRON HCL 4 MG/2ML IJ SOLN
4.0000 mg | Freq: Three times a day (TID) | INTRAMUSCULAR | Status: DC | PRN
  Administered 2020-08-05 – 2020-08-06 (×3): 4 mg via INTRAVENOUS
  Filled 2020-08-04 (×3): qty 2

## 2020-08-04 MED ORDER — GADOBUTROL 1 MMOL/ML IV SOLN
5.0000 mL | Freq: Once | INTRAVENOUS | Status: AC | PRN
  Administered 2020-08-04: 5 mL via INTRAVENOUS
  Filled 2020-08-04: qty 6

## 2020-08-04 MED ORDER — ONDANSETRON HCL 4 MG/2ML IJ SOLN
4.0000 mg | Freq: Once | INTRAMUSCULAR | Status: AC
  Administered 2020-08-04: 4 mg via INTRAVENOUS
  Filled 2020-08-04: qty 2

## 2020-08-04 NOTE — ED Notes (Signed)
MRI speaking with patient at this time.

## 2020-08-04 NOTE — ED Notes (Signed)
GI MD at bedside to speak with patient.

## 2020-08-04 NOTE — ED Triage Notes (Signed)
Pt to ER via POV with complaints of generalized abdominal pain that radiates into her midsternum area. Pt recently seen by her PCP for the same problem. States she was placed on statins for high cholesterol before the problem started. Pt also reports unintentional weight loss and constant nausea. Reports dark color urine/ pink urine. Poor appetite.

## 2020-08-04 NOTE — ED Notes (Signed)
Informed RN bed assigned 

## 2020-08-04 NOTE — ED Notes (Signed)
MD at bedside to speak with patient about results

## 2020-08-04 NOTE — ED Notes (Signed)
Patient transported to CT 

## 2020-08-04 NOTE — ED Notes (Signed)
Patient transported to MRI 

## 2020-08-04 NOTE — H&P (Signed)
History and Physical    Shirley Barton [id number] DOB: 25-Jun-1950 DOA: 08/04/2020  Referring MD/NP/PA:   PCP: Casilda Carls, MD   Patient coming from:  The patient is coming from home.  At baseline, pt is independent for most of ADL.        Chief Complaint: Abdominal pain, nausea, vomiting  HPI: Shirley Barton is a 70 y.o. female with medical history significant of hypertension, hyperlipidemia, COPD, GERD, depression, anxiety, skin cancer, PVD, who presents with abdominal pain, nausea, vomiting.  Patient states that she has chronic intermittent abdominal pain for years but her abdominal pain has worsened for about 2 weeks, much worse during the past several days.  The abdominal pain is located in the right upper and right middle quadrant, constant, 10 out of 10 severity, sharp, nonradiating.  Associated with 2 or 3 times of nonbilious nonbloody vomiting per day. Denies diarrhea.  Patient has mild dry cough, mild shortness of breath due to COPD, which has not changed.  No chest pain, fever or chills.  Denies symptoms of UTI.  No unilateral weakness.  ED Course: pt was found to have WBC 6.7, negative COVID PCR, CK level 49, urinalysis not impressive, INR 0.9, troponin level 3, 3, liver function (ALP 205, AST 155, ALT 398, total bilirubin 5.6, direct bilirubin 3.7), renal function okay, temperature normal, blood pressure 146/70, heart rate 100, 83, RR 18, oxygen saturation 96% on room air.  Patient is admitted to [place] bed as inpatient.  Dr. Bonna Gains of GI and Dr. Rogue Bussing of oncology are consulted.  CT-abd/pelvis: 1. Intrahepatic and extrahepatic biliary duct dilatation which is greater than is expected with cholecystectomy state. Mild enhancement is noted at the ampulla. There is also a degree of pancreatic duct dilatation. These findings raise concern for potential neoplastic lesion at or near the ampulla. A well-defined mass is not appreciated by CT. These findings are  sufficiently suspicious for potential lesion in this area to warrant correlation with abdominal MR pre and post-contrast as well as MRCP to further evaluate. Gallbladder is absent.  2. Wall thickening in the gastric antrum concerning for gastritis in this area. No other bowel wall thickening. No bowel obstruction. No abscess in the abdomen or pelvis. Appendix region appears unremarkable.  3. Abdominal aortic aneurysm with maximum transverse diameter of 3.4 x 3.3 cm. Recommend follow-up ultrasound every 3 years. This recommendation follows ACR consensus guidelines: White Paper of the ACR Incidental Findings Committee II on Vascular Findings. J Am Coll Radiol 2013; 10:789-794. thrombus noted in the periphery of this aneurysm. There are stents in each iliac artery. There is aortic and multifocal pelvic arterial vascular calcification.  4. No renal or ureteral calculus. No hydronephrosis. Urinary bladder wall thickness normal.   MRCP: 1. 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 Differential would include benign and malignant stricturing of the distal common bile duct versus is external mass lesion constricting the duct. Benign stricturing would include inflammation versus cholangiocarcinoma. External compression differential would include pancreatic adenocarcinoma. 2. Subtle lesion in the head of the pancreas. Subtle interruption of the pancreatic duct through the head of the pancreas. These findings favor a pancreatic neoplasm (adenocarcinoma) as source of ductal obstruction. 3. Enhancing lesion the RIGHT hepatic lobe is favored benign vascular lesion. 4. No lymphadenopathy identified. 5. Recommend endoscopic ultrasound for evaluation of the pancreatic head / distal common bile duct.   Review of Systems:   General: no fevers, chills,  no body weight gain, has poor appetite, has fatigue HEENT: no blurry vision,  hearing changes or sore throat Respiratory: has dyspnea, coughing, no wheezing CV: no chest pain, no palpitations GI: has nausea, vomiting, abdominal pain, no diarrhea, constipation GU: no dysuria, burning on urination, increased urinary frequency, hematuria  Ext: no leg edema Neuro: no unilateral weakness, numbness, or tingling, no vision change or hearing loss Skin: no rash, no skin tear. MSK: No muscle spasm, no deformity, no limitation of range of movement in spin Heme: No easy bruising.  Travel history: No recent long distant travel.  Allergy:  Allergies  Allergen Reactions  . Tramadol   . Cortisone Rash    Pt states that she has been getting cortisone shots    . Cortizone-10 [Hydrocortisone] Rash  . Penicillins Rash    Pt states that she has been taking this medication and has had not problem    Past Medical History:  Diagnosis Date  . Abdominal pain, generalized   . Anxiety   . Atypical chest pain   . Back pain   . Basal cell carcinoma of skin 2013   resected from Left scalp area.   Marland Kitchen COPD (chronic obstructive pulmonary disease) ([place])   . Depression   . DOE (dyspnea on exertion)   . GERD (gastroesophageal reflux disease)   . Hypertension   . Shortness of breath dyspnea     Past Surgical History:  Procedure Laterality Date  . BASAL CELL CARCINOMA EXCISION  2013  . CARPAL TUNNEL RELEASE Left 90s  . CHOLECYSTECTOMY N/A 09/16/2015   Procedure: LAPAROSCOPIC CHOLECYSTECTOMY WITH INTRAOPERATIVE CHOLANGIOGRAM;  Surgeon: Robert Bellow, MD;  Location: [facility] ORS;  Service: General;  Laterality: N/A;  . COLONOSCOPY  [id number]   [place]  . CORONARY ARTERY BYPASS GRAFT    . ESOPHAGOGASTRODUODENOSCOPY (EGD) WITH PROPOFOL N/A 02/05/2017   Procedure: ESOPHAGOGASTRODUODENOSCOPY (EGD) WITH PROPOFOL;  Surgeon: Lollie Sails, MD;  Location: [facility] ENDOSCOPY;  Service: Endoscopy;  Laterality: N/A;  . PERIPHERAL VASCULAR CATHETERIZATION Bilateral 12/27/2015   Procedure: Lower  Extremity Angiography;  Surgeon: Katha Cabal, MD;  Location: [facility];  Service: Cardiovascular;  Laterality: Bilateral;  . TONSILLECTOMY      Social History:  reports that she quit smoking about 8 years ago. She has never used smokeless tobacco. She reports that she does not drink alcohol and does not use drugs.  Family History:  Family History  Problem Relation Age of Onset  . Cerebral palsy Daughter   . Colon polyps Sister   . Leukemia Daughter   . Depression Son   . Breast cancer Neg Hx      Prior to Admission medications   Medication Sig Start Date End Date Taking? Authorizing Provider  acetaminophen (TYLENOL) 325 MG tablet Take 650 mg by mouth every 6 (six) hours as needed.    [provider]  cetirizine (ZYRTEC) 10 MG tablet Take 10 mg by mouth daily. 03/18/18   [provider]  cyanocobalamin 2000 MCG tablet Take 2,000 mcg daily by mouth.    [provider]  cyclobenzaprine (FLEXERIL) 5 MG tablet Take 1 tablet (5 mg total) by mouth 3 (three) times daily as needed for muscle spasms. 03/17/18   Menshew, Dannielle Karvonen, PA-C  dicyclomine (BENTYL) 10 MG capsule Take 10 mg 4 (four) times daily -  before meals and at bedtime by mouth.    [provider]  Fluticasone-Salmeterol (ADVAIR) 100-50 MCG/DOSE AEPB Inhale 1 puff into the lungs  2 (two) times daily.    [provider]  ipratropium (ATROVENT) 0.03 % nasal spray Place 2 sprays every 12 (twelve) hours into both nostrils.    [provider]  ketorolac (TORADOL) 10 MG tablet Take 1 tablet (10 mg total) by mouth every 8 (eight) hours. 03/17/18   Menshew, Dannielle Karvonen, PA-C  metoCLOPramide (REGLAN) 5 MG tablet Take 1 tablet (5 mg total) by mouth 2 (two) times daily. 10/06/15   Robert Bellow, MD  mometasone (ELOCON) 0.1 % lotion Apply daily topically.    [provider]  ondansetron (ZOFRAN ODT) 4 MG disintegrating tablet Take 1 tablet (4 mg total) by  mouth every 6 (six) hours as needed for nausea or vomiting. 08/28/15   Delman Kitten, MD  pantoprazole (PROTONIX) 40 MG tablet Take by mouth. 09/07/15   [provider]  promethazine (PHENERGAN) 12.5 MG tablet Take 1 tablet (12.5 mg total) by mouth every 6 (six) hours as needed for nausea or vomiting. 04/06/17   Merlyn Lot, MD  sertraline (ZOLOFT) 50 MG tablet Take 1 tablet (50 mg total) by mouth daily with breakfast. 10/28/18   Ursula Alert, MD  sucralfate (CARAFATE) 1 g tablet Take 1 g 4 (four) times daily -  with meals and at bedtime by mouth.    [provider]  traZODone (DESYREL) 50 MG tablet TAKE 1/2 TO 1 TABLET BY MOUTH AT BEDTIME AS NEEDED FOR SLEEP 09/25/18   Ursula Alert, MD  Vitamin D, Ergocalciferol, (DRISDOL) 1.25 MG (50000 UT) CAPS capsule TAKE 1 CAPSULE BY MOUTH ONE TIME PER WEEK 06/25/18   [provider]    Physical Exam: Vitals:   08/04/20 1148 08/04/20 1200 08/04/20 1245 08/04/20 1530  BP: (!) 147/77 (!) 150/76 (!) 146/70 136/77  Pulse: 91 88 83 75  Resp: 16  16 16   Temp:      TempSrc:      SpO2: 98% 97% 97% 94%  Weight:      Height:       General: Not in acute distress HEENT:       Eyes: PERRL, EOMI, has scleral icterus.       ENT: No discharge from the ears and nose, no pharynx injection, no tonsillar enlargement.        Neck: No JVD, no bruit, no mass felt. Heme: No neck lymph node enlargement. Cardiac: S1/S2, RRR, No murmurs, No gallops or rubs. Respiratory: No rales, wheezing, rhonchi or rubs. GI: Soft, nondistended, has diffused tenderness, worse in RUQ. No rebound pain, no organomegaly, BS present. GU: No hematuria Ext: No pitting leg edema bilaterally. 1+DP/PT pulse bilaterally. Musculoskeletal: No joint deformities, No joint redness or warmth, no limitation of ROM in spin. Skin: No rashes.  Neuro: Alert, oriented X3, cranial nerves II-XII grossly intact, moves all extremities normally. Psych: Patient is not psychotic, no  suicidal or hemocidal ideation.  Labs on Admission: I have personally reviewed following labs and imaging studies  CBC: Recent Labs  Lab 08/04/20 0948  WBC 6.7  HGB 15.2*  HCT 46.8*  MCV 92.1  PLT 332   Basic Metabolic Panel: Recent Labs  Lab 08/04/20 0948  NA 138  K 3.8  CL 100  CO2 26  GLUCOSE 104*  BUN <5*  CREATININE 0.52  CALCIUM 9.9   GFR: Estimated Creatinine Clearance: 48.2 mL/min (by C-G formula based on SCr of 0.52 mg/dL). Liver Function Tests: Recent Labs  Lab 08/04/20 0948  AST 155*  ALT 398*  ALKPHOS 205*  BILITOT 5.6*  PROT 7.8  ALBUMIN 4.2   Recent Labs  Lab 08/04/20 0948  LIPASE 94*   No results for input(s): AMMONIA in the last 168 hours. Coagulation Profile: Recent Labs  Lab 08/04/20 1138  INR 0.9   Cardiac Enzymes: Recent Labs  Lab 08/04/20 0948  CKTOTAL 49   BNP (last 3 results) No results for input(s): PROBNP in the last 8760 hours. HbA1C: No results for input(s): HGBA1C in the last 72 hours. CBG: No results for input(s): GLUCAP in the last 168 hours. Lipid Profile: No results for input(s): CHOL, HDL, LDLCALC, TRIG, CHOLHDL, LDLDIRECT in the last 72 hours. Thyroid Function Tests: No results for input(s): TSH, T4TOTAL, FREET4, T3FREE, THYROIDAB in the last 72 hours. Anemia Panel: No results for input(s): VITAMINB12, FOLATE, FERRITIN, TIBC, IRON, RETICCTPCT in the last 72 hours. Urine analysis:    Component Value Date/Time   COLORURINE AMBER (A) 08/04/2020 1138   APPEARANCEUR CLEAR (A) 08/04/2020 1138   APPEARANCEUR Clear 10/06/2015 1406   LABSPEC >1.046 (H) 08/04/2020 1138   PHURINE 5.0 08/04/2020 1138   GLUCOSEU NEGATIVE 08/04/2020 1138   HGBUR SMALL (A) 08/04/2020 1138   BILIRUBINUR MODERATE (A) 08/04/2020 1138   BILIRUBINUR Negative 10/06/2015 1406   KETONESUR 5 (A) 08/04/2020 1138   PROTEINUR NEGATIVE 08/04/2020 1138   UROBILINOGEN 0.2 04/10/2012 0946   NITRITE NEGATIVE 08/04/2020 1138   LEUKOCYTESUR  NEGATIVE 08/04/2020 1138   Sepsis Labs: @LABRCNTIP (procalcitonin:4,lacticidven:4) ) Recent Results (from the past 240 hour(s))  Resp Panel by RT-PCR (Flu A&B, Covid) Nasopharyngeal Swab     Status: None   Collection Time: 08/04/20  1:20 PM   Specimen: Nasopharyngeal Swab; Nasopharyngeal(NP) swabs in vial transport medium  Result Value Ref Range Status   SARS Coronavirus 2 by RT PCR NEGATIVE NEGATIVE Final    Comment: (NOTE) SARS-CoV-2 target nucleic acids are NOT DETECTED.  The SARS-CoV-2 RNA is generally detectable in upper respiratory specimens during the acute phase of infection. The lowest concentration of SARS-CoV-2 viral copies this assay can detect is 138 copies/mL. A negative result does not preclude SARS-Cov-2 infection and should not be used as the sole basis for treatment or other patient management decisions. A negative result may occur with  improper specimen collection/handling, submission of specimen other than nasopharyngeal swab, presence of viral mutation(s) within the areas targeted by this assay, and inadequate number of viral copies(<138 copies/mL). A negative result must be combined with clinical observations, patient history, and epidemiological information. The expected result is Negative.  Fact Sheet for Patients:  EntrepreneurPulse.com.au  Fact Sheet for Healthcare Providers:  IncredibleEmployment.be  This test is no t yet approved or cleared by the [place] FDA and  has been authorized for detection and/or diagnosis of SARS-CoV-2 by FDA under an Emergency Use Authorization (EUA). This EUA will remain  in effect (meaning this test can be used) for the duration of the COVID-19 declaration under Section 564(b)(1) of the Act, 21 U.S.C.section 360bbb-3(b)(1), unless the authorization is terminated  or revoked sooner.       Influenza A by PCR NEGATIVE NEGATIVE Final   Influenza B by PCR NEGATIVE NEGATIVE Final     Comment: (NOTE) The Xpert Xpress SARS-CoV-2/FLU/RSV plus assay is intended as an aid in the diagnosis of influenza from Nasopharyngeal swab specimens and should not be used as a sole basis for treatment. Nasal washings and aspirates are unacceptable for Xpert Xpress SARS-CoV-2/FLU/RSV testing.  Fact Sheet for Patients: EntrepreneurPulse.com.au  Fact Sheet for Healthcare Providers: IncredibleEmployment.be  This test is not yet approved or cleared by the [place] and has been authorized for detection and/or diagnosis of SARS-CoV-2 by FDA under an Emergency Use Authorization (EUA). This EUA will remain in effect (meaning this test can be used) for the duration of the COVID-19 declaration under Section 564(b)(1) of the Act, 21 U.S.C. section 360bbb-3(b)(1), unless the authorization is terminated or revoked.  Performed at [facility], [address] [place], [place] [address]      Radiological Exams on Admission: CT ABDOMEN PELVIS W CONTRAST  Result Date: 08/04/2020 CLINICAL DATA:  Abdominal pain with nausea and vomiting EXAM: CT ABDOMEN AND PELVIS WITH CONTRAST TECHNIQUE: Multidetector CT imaging of the abdomen and pelvis was performed using the standard protocol following bolus administration of intravenous contrast. CONTRAST:  3mL OMNIPAQUE IOHEXOL 300 MG/ML  SOLN COMPARISON:  April 06, 2017 FINDINGS: Lower chest: No lung base edema or consolidation evident. Suggestion of bibasilar underlying emphysematous change. Hepatobiliary: No focal liver lesions are evident. Gallbladder is absent. There is generalized intrahepatic biliary duct dilatation. There is dilatation of the common hepatic and common bile ducts to the level of the ampulla. There is mild enhancement at the level of the ampulla without well-defined mass appreciable. Pancreas: There is pancreatic duct dilatation measuring just under 5 mm at its maximum. No well-defined  pancreatic mass is seen by CT. No peripancreatic fluid. Spleen: No splenic lesions are evident. Adrenals/Urinary Tract: Adrenals bilaterally appear normal. There is a 4 mm apparent cyst in the lower pole right kidney. No evident hydronephrosis on either side. Extrarenal pelvis on each side, an anatomic variant. No appreciable renal or ureteral calculus on either side. Urinary bladder is midline with wall thickness within normal limits. Stomach/Bowel: Moderate stool throughout colon noted. There is wall thickening in the gastric antrum. There is no appreciable small or large bowel wall thickening. No evident bowel obstruction. Terminal ileum appears normal. No evident appendiceal inflammation. No free air or portal venous air. Vascular/Lymphatic: There is aneurysmal dilatation of the distal abdominal aorta extending to the bifurcation with a maximum transverse diameter of 3.4 x 3.3 cm. There is thrombus in the periphery of this aneurysmal dilatation. There are stents in each common iliac artery. Extensive aortic and pelvic arterial atherosclerotic calcification noted. Calcification noted in each proximal renal artery. Major venous structures appear patent. No evident adenopathy in the abdomen or pelvis. Reproductive: Uterus is anteverted.  No adnexal masses evident. Other: No appreciable abscess or ascites in the abdomen or pelvis. Musculoskeletal: No blastic or lytic bone lesions. There are degenerative changes in the lumbar spine. No appreciable abdominal wall or intramuscular lesions. IMPRESSION: 1. Intrahepatic and extrahepatic biliary duct dilatation which is greater than is expected with cholecystectomy state. Mild enhancement is noted at the ampulla. There is also a degree of pancreatic duct dilatation. These findings raise concern for potential neoplastic lesion at or near the ampulla. A well-defined mass is not appreciated by CT. These findings are sufficiently suspicious for potential lesion in this area to  warrant correlation with abdominal MR pre and post-contrast as well as MRCP to further evaluate. Gallbladder is absent. 2. Wall thickening in the gastric antrum concerning for gastritis in this area. No other bowel wall thickening. No bowel obstruction. No abscess in the abdomen or pelvis. Appendix region appears unremarkable. 3. Abdominal aortic aneurysm with maximum transverse diameter of 3.4 x 3.3 cm. Recommend follow-up ultrasound every 3 years. This recommendation follows ACR consensus guidelines: White Paper of the ACR Incidental  Findings Committee II on Vascular Findings. J Am Coll Radiol 2013; 10:789-794. thrombus noted in the periphery of this aneurysm. There are stents in each iliac artery. There is aortic and multifocal pelvic arterial vascular calcification. 4. No renal or ureteral calculus. No hydronephrosis. Urinary bladder wall thickness normal. Electronically Signed   By: Lowella Grip III M.D.   On: 08/04/2020 13:04   MR 3D Recon At Scanner  Result Date: 08/04/2020 CLINICAL DATA:  9 female with intra and extrahepatic biliary duct dilatation. Postcholecystectomy. EXAM: MRI ABDOMEN WITHOUT AND WITH CONTRAST (INCLUDING MRCP) TECHNIQUE: Multiplanar multisequence MR imaging of the abdomen was performed both before and after the administration of intravenous contrast. Heavily T2-weighted images of the biliary and pancreatic ducts were obtained, and three-dimensional MRCP images were rendered by post processing. CONTRAST:  12mL GADAVIST GADOBUTROL 1 MMOL/ML IV SOLN COMPARISON:  CT 08/04/2020, 10/19/2015 FINDINGS: Lower chest:  Lung bases clear Hepatobiliary: Marked intra and extrahepatic biliary duct dilatation. Extrahepatic bile ducts measure up to 13 mm. Common bile duct measures up to 13 mm. No filling defect within the common bile duct. There is abrupt narrowing of the distal common bile duct with near complete loss of the lumen over a 7 mm segment (image 10/MRCP MIP sequence). The most  distal common bile duct reconstitutes to normal caliber (3 mm) over a 26 mm segment leading up to the ampulla. This stricturing is seen on coronal postcontrast T1 weighted imaging (image 40/25). There is enhancing a lesion the RIGHT hepatic lobe (image 14/9) appears to have a vascular pattern. Lesion is subtly evident on more delayed imaging (series 21). Lesion fades to isointense on the 90 second imaging. No additional enhancing hepatic lesions. Pancreas: Within the lateral aspect of the pancreatic head, there is a subtle hypointensity on noncontrast T1 weighted imaging measuring 10 mm x 10 mm on image 41/series 18. There is mild pancreatic duct dilatation proximal to this lesion on same image. The duct is lost through this region. On the delayed vascular imaging this same region is more difficult to define. There subtle ductal interruption in the same vicinity of the stricturing of the common bile duct. (Image 43/24 ) No periportal or peripancreatic lymphadenopathy. Spleen: Normal spleen. Adrenals/urinary tract: Adrenal glands and kidneys are normal. Stomach/Bowel: Stomach and limited of the small bowel is unremarkable Vascular/Lymphatic: Abdominal aortic normal caliber. No retroperitoneal periportal lymphadenopathy. Musculoskeletal: No aggressive osseous lesion IMPRESSION: 1. 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 Differential would include benign and malignant stricturing of the distal common bile duct versus is external mass lesion constricting the duct. Benign stricturing would include inflammation versus cholangiocarcinoma. External compression differential would include pancreatic adenocarcinoma. 2. Subtle lesion in the head of the pancreas. Subtle interruption of the pancreatic duct through the head of the pancreas. These findings favor a pancreatic neoplasm (adenocarcinoma) as source of ductal obstruction. 3. Enhancing lesion the RIGHT  hepatic lobe is favored benign vascular lesion. 4. No lymphadenopathy identified. 5. Recommend endoscopic ultrasound for evaluation of the pancreatic head / distal common bile duct. Electronically Signed   By: Suzy Bouchard M.D.   On: 08/04/2020 15:09   MR ABDOMEN MRCP W WO CONTAST  Result Date: 08/04/2020 CLINICAL DATA:  40 female with intra and extrahepatic biliary duct dilatation. Postcholecystectomy. EXAM: MRI ABDOMEN WITHOUT AND WITH CONTRAST (INCLUDING MRCP) TECHNIQUE: Multiplanar multisequence MR imaging of the abdomen was performed both before and after the administration of intravenous contrast. Heavily T2-weighted images  of the biliary and pancreatic ducts were obtained, and three-dimensional MRCP images were rendered by post processing. CONTRAST:  62mL GADAVIST GADOBUTROL 1 MMOL/ML IV SOLN COMPARISON:  CT 08/04/2020, 10/19/2015 FINDINGS: Lower chest:  Lung bases clear Hepatobiliary: Marked intra and extrahepatic biliary duct dilatation. Extrahepatic bile ducts measure up to 13 mm. Common bile duct measures up to 13 mm. No filling defect within the common bile duct. There is abrupt narrowing of the distal common bile duct with near complete loss of the lumen over a 7 mm segment (image 10/MRCP MIP sequence). The most distal common bile duct reconstitutes to normal caliber (3 mm) over a 26 mm segment leading up to the ampulla. This stricturing is seen on coronal postcontrast T1 weighted imaging (image 40/25). There is enhancing a lesion the RIGHT hepatic lobe (image 14/9) appears to have a vascular pattern. Lesion is subtly evident on more delayed imaging (series 21). Lesion fades to isointense on the 90 second imaging. No additional enhancing hepatic lesions. Pancreas: Within the lateral aspect of the pancreatic head, there is a subtle hypointensity on noncontrast T1 weighted imaging measuring 10 mm x 10 mm on image 41/series 18. There is mild pancreatic duct dilatation proximal to this lesion  on same image. The duct is lost through this region. On the delayed vascular imaging this same region is more difficult to define. There subtle ductal interruption in the same vicinity of the stricturing of the common bile duct. (Image 43/24 ) No periportal or peripancreatic lymphadenopathy. Spleen: Normal spleen. Adrenals/urinary tract: Adrenal glands and kidneys are normal. Stomach/Bowel: Stomach and limited of the small bowel is unremarkable Vascular/Lymphatic: Abdominal aortic normal caliber. No retroperitoneal periportal lymphadenopathy. Musculoskeletal: No aggressive osseous lesion IMPRESSION: 1. 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 Differential would include benign and malignant stricturing of the distal common bile duct versus is external mass lesion constricting the duct. Benign stricturing would include inflammation versus cholangiocarcinoma. External compression differential would include pancreatic adenocarcinoma. 2. Subtle lesion in the head of the pancreas. Subtle interruption of the pancreatic duct through the head of the pancreas. These findings favor a pancreatic neoplasm (adenocarcinoma) as source of ductal obstruction. 3. Enhancing lesion the RIGHT hepatic lobe is favored benign vascular lesion. 4. No lymphadenopathy identified. 5. Recommend endoscopic ultrasound for evaluation of the pancreatic head / distal common bile duct. Electronically Signed   By: Suzy Bouchard M.D.   On: 08/04/2020 15:09     EKG: I have personally reviewed.  Sinus rhythm, QTC 448, low voltage, nonspecific T wave change  Assessment/Plan Principal Problem:   Abdominal pain Active Problems:   Chronic obstructive pulmonary disease (HCC)   Essential (primary) hypertension   HLD (hyperlipidemia)   GERD (gastroesophageal reflux disease)   Depression   AAA (abdominal aortic aneurysm) (HCC)   Pancreatic mass   Dilated bile duct   Abdominal  pain due to pancreatic mass and dilated bile duct: Patient likely has pancreatic malignancy.  Dr. Bonna Gains of GI was consulted --> Dr. Allen Norris of GI will possibly do ERCP tomorrow.  Consulted Dr. Rogue Bussing of oncology  -Admitted to MedSurg bed as inpatient -As needed oxycodone and morphine for pain -As needed Zofran for nausea - N.p.o. after midnight- -IV fluid: 1 L LR, followed by 75 cc/h -Check CA-19-9  Chronic obstructive pulmonary disease ([place]): Stable -Bronchodilators  Essential (primary) hypertension: Blood pressure 146/70.  Patient is not taking medications currently. -As needed hydralazine  HLD (hyperlipidemia) -Patient stopped  taking Lipitor  GERD (gastroesophageal reflux disease) -Protonix  Depression -Patient is not taking medications Zoloft currently   AAA (abdominal aortic aneurysm) ([place]): This is incidental findings. -Follow-up with PCP         DVT ppx: SCD Code Status: Full code Family Communication:  Yes, patient's  Son by phone Disposition Plan:  Anticipate discharge back to previous environment Consults called:  Dr. Bonna Gains of GI and Dr. Rogue Bussing of oncology are consulted Admission status and Level of care: Med-Surg:   as inpt    Status is: Inpatient  Remains inpatient appropriate because:Inpatient level of care appropriate due to severity of illness   Dispo: The patient is from: Home              Anticipated d/c is to: Home              Patient currently is not medically stable to d/c.   Difficult to place patient No         Date of Service 08/04/2020    Ivor [facility]   If 7PM-7AM, please contact night-coverage [email or web address] 08/04/2020, 4:36 PM

## 2020-08-04 NOTE — ED Provider Notes (Signed)
[facility] Emergency Department Provider Note  ____________________________________________   Event Date/Time   First MD Initiated Contact with Patient 08/04/20 1108     (approximate)  I have reviewed the triage vital signs and the nursing notes.   HISTORY  Chief Complaint Abdominal Pain and Chest Pain   HPI Shirley Barton is a 70 y.o. female with a past medical history of anxiety, atypical chest pain, COPD, remote tobacco abuse, HTN, GERD, depression, PAD, chronic abdominal pain with chronic intermittent vomiting and diarrhea over the last couple years who presents for assessment of acute on chronic abdominal pain associated with decreased appetite and worsening nausea and vomiting whenever she is attempting to eat anything and more persistent diarrhea.  She also states the pain is rating to her chest and sometimes feels a little bit of sharp chest pain but not sure if it is rating her abdomen or separate pain in her chest.  Endorses a chronic cough and shortness of breath is not significantly changed.  No headache or earache, sore throat, back pain, urinary symptoms rash or extremity pain.  No recent falls or injuries.  She denies any significant EtOH use NSAID use or illicit drug use.         Past Medical History:  Diagnosis Date  . Abdominal pain, generalized   . Anxiety   . Atypical chest pain   . Back pain   . Basal cell carcinoma of skin 2013   resected from Left scalp area.   Marland Kitchen COPD (chronic obstructive pulmonary disease) ([place])   . Depression   . DOE (dyspnea on exertion)   . GERD (gastroesophageal reflux disease)   . Hypertension   . Shortness of breath dyspnea     Patient Active Problem List   Diagnosis Date Noted  . Abdominal pain 08/04/2020  . Diarrhea 12/07/2017  . Nausea and vomiting 12/07/2017  . PAD (peripheral artery disease) ([place]) 01/25/2016  . Iliac artery stenosis, bilateral ([place]) 01/25/2016  . Atherosclerosis of native  artery of both lower extremities with intermittent claudication ([place]) 01/25/2016  . Atypical chest pain 10/11/2015  . DOE (dyspnea on exertion) 10/11/2015  . Chronic abdominal pain 10/06/2015  . Chronic obstructive pulmonary disease ([place]) 09/10/2013  . Essential (primary) hypertension 06/30/2008    Past Surgical History:  Procedure Laterality Date  . BASAL CELL CARCINOMA EXCISION  2013  . CARPAL TUNNEL RELEASE Left 90s  . CHOLECYSTECTOMY N/A 09/16/2015   Procedure: LAPAROSCOPIC CHOLECYSTECTOMY WITH INTRAOPERATIVE CHOLANGIOGRAM;  Surgeon: Robert Bellow, MD;  Location: [facility] ORS;  Service: General;  Laterality: N/A;  . COLONOSCOPY  0923"R   [place]  . CORONARY ARTERY BYPASS GRAFT    . ESOPHAGOGASTRODUODENOSCOPY (EGD) WITH PROPOFOL N/A 02/05/2017   Procedure: ESOPHAGOGASTRODUODENOSCOPY (EGD) WITH PROPOFOL;  Surgeon: Lollie Sails, MD;  Location: [facility] ENDOSCOPY;  Service: Endoscopy;  Laterality: N/A;  . PERIPHERAL VASCULAR CATHETERIZATION Bilateral 12/27/2015   Procedure: Lower Extremity Angiography;  Surgeon: Katha Cabal, MD;  Location: [place] CV LAB;  Service: Cardiovascular;  Laterality: Bilateral;  . TONSILLECTOMY      Prior to Admission medications   Medication Sig Start Date End Date Taking? Authorizing Provider  acetaminophen (TYLENOL) 325 MG tablet Take 650 mg by mouth every 6 (six) hours as needed.    [provider]  cetirizine (ZYRTEC) 10 MG tablet Take 10 mg by mouth daily. 03/18/18   [provider]  cyanocobalamin 2000 MCG tablet Take 2,000 mcg daily by mouth.  [provider]  cyclobenzaprine (FLEXERIL) 5 MG tablet Take 1 tablet (5 mg total) by mouth 3 (three) times daily as needed for muscle spasms. 03/17/18   Menshew, Dannielle Karvonen, PA-C  dicyclomine (BENTYL) 10 MG capsule Take 10 mg 4 (four) times daily -  before meals and at bedtime by mouth.    [provider]  Fluticasone-Salmeterol (ADVAIR) 100-50 MCG/DOSE  AEPB Inhale 1 puff into the lungs 2 (two) times daily.    [provider]  ipratropium (ATROVENT) 0.03 % nasal spray Place 2 sprays every 12 (twelve) hours into both nostrils.    [provider]  ketorolac (TORADOL) 10 MG tablet Take 1 tablet (10 mg total) by mouth every 8 (eight) hours. 03/17/18   Menshew, Dannielle Karvonen, PA-C  metoCLOPramide (REGLAN) 5 MG tablet Take 1 tablet (5 mg total) by mouth 2 (two) times daily. 10/06/15   Robert Bellow, MD  mometasone (ELOCON) 0.1 % lotion Apply daily topically.    [provider]  ondansetron (ZOFRAN ODT) 4 MG disintegrating tablet Take 1 tablet (4 mg total) by mouth every 6 (six) hours as needed for nausea or vomiting. 08/28/15   Delman Kitten, MD  pantoprazole (PROTONIX) 40 MG tablet Take by mouth. 09/07/15   [provider]  promethazine (PHENERGAN) 12.5 MG tablet Take 1 tablet (12.5 mg total) by mouth every 6 (six) hours as needed for nausea or vomiting. 04/06/17   Merlyn Lot, MD  sertraline (ZOLOFT) 50 MG tablet Take 1 tablet (50 mg total) by mouth daily with breakfast. 10/28/18   Ursula Alert, MD  sucralfate (CARAFATE) 1 g tablet Take 1 g 4 (four) times daily -  with meals and at bedtime by mouth.    [provider]  traZODone (DESYREL) 50 MG tablet TAKE 1/2 TO 1 TABLET BY MOUTH AT BEDTIME AS NEEDED FOR SLEEP 09/25/18   Ursula Alert, MD  Vitamin D, Ergocalciferol, (DRISDOL) 1.25 MG (50000 UT) CAPS capsule TAKE 1 CAPSULE BY MOUTH ONE TIME PER WEEK 06/25/18   [provider]    Allergies Tramadol, Cortisone, Cortizone-10 [hydrocortisone], and Penicillins  Family History  Problem Relation Age of Onset  . Cerebral palsy Daughter   . Colon polyps Sister   . Leukemia Daughter   . Depression Son   . Breast cancer Neg Hx     Social History Social History   Tobacco Use  . Smoking status: Former Smoker    Quit date: 03/02/2012    Years since quitting: 8.4  . Smokeless tobacco: Never  Used  Vaping Use  . Vaping Use: Never used  Substance Use Topics  . Alcohol use: No    Alcohol/week: 0.0 standard drinks  . Drug use: No    Review of Systems  Review of Systems  Constitutional: Positive for malaise/fatigue and weight loss. Negative for chills and fever.  HENT: Negative for sore throat.   Eyes: Negative for pain.  Respiratory: Negative for cough and stridor.   Cardiovascular: Negative for chest pain.  Gastrointestinal: Positive for abdominal pain, diarrhea, nausea and vomiting.  Genitourinary: Negative for dysuria.  Musculoskeletal: Positive for myalgias.  Skin: Negative for rash.  Neurological: Negative for seizures, loss of consciousness and headaches.  Psychiatric/Behavioral: Negative for suicidal ideas.  All other systems reviewed and are negative.     ____________________________________________   PHYSICAL EXAM:  VITAL SIGNS: ED Triage Vitals  Enc Vitals Group     BP 08/04/20 0943 (!) 117/98     Pulse Rate  08/04/20 0943 100     Resp 08/04/20 0943 18     Temp 08/04/20 0943 98.3 F (36.8 C)     Temp Source 08/04/20 0943 Oral     SpO2 08/04/20 0943 96 %     Weight 08/04/20 0952 100 lb (45.4 kg)     Height 08/04/20 0944 $RemoveBefor'5\' 4"'evHGwsDFhDHv$  (1.626 m)     Head Circumference --      Peak Flow --      Pain Score 08/04/20 0952 10     Pain Loc --      Pain Edu? --      Excl. in [place]? --    Vitals:   08/04/20 1245 08/04/20 1530  BP: (!) 146/70 136/77  Pulse: 83 75  Resp: 16 16  Temp:    SpO2: 97% 94%   Physical Exam Vitals and nursing note reviewed.  Constitutional:      General: She is not in acute distress.    Appearance: She is underweight. She is ill-appearing.  HENT:     Head: Normocephalic and atraumatic.     Right Ear: External ear normal.     Left Ear: External ear normal.     Nose: Nose normal.     Mouth/Throat:     Mouth: Mucous membranes are dry.  Eyes:     General: Scleral icterus present.     Conjunctiva/sclera: Conjunctivae normal.   Cardiovascular:     Rate and Rhythm: Normal rate and regular rhythm.     Heart sounds: No murmur heard.   Pulmonary:     Effort: Pulmonary effort is normal. No respiratory distress.     Breath sounds: Normal breath sounds.  Abdominal:     Palpations: Abdomen is soft.     Tenderness: There is abdominal tenderness. There is no right CVA tenderness or left CVA tenderness.  Musculoskeletal:     Cervical back: Neck supple.  Skin:    General: Skin is warm and dry.     Capillary Refill: Capillary refill takes more than 3 seconds.     Coloration: Skin is jaundiced.  Neurological:     Mental Status: She is alert and oriented to person, place, and time.  Psychiatric:        Mood and Affect: Mood normal.      ____________________________________________   LABS (all labs ordered are listed, but only abnormal results are displayed)  Labs Reviewed  LIPASE, BLOOD - Abnormal; Notable for the following components:      Result Value   Lipase 94 (*)    All other components within normal limits  COMPREHENSIVE METABOLIC PANEL - Abnormal; Notable for the following components:   Glucose, Bld 104 (*)    BUN <5 (*)    AST 155 (*)    ALT 398 (*)    Alkaline Phosphatase 205 (*)    Total Bilirubin 5.6 (*)    All other components within normal limits  CBC - Abnormal; Notable for the following components:   Hemoglobin 15.2 (*)    HCT 46.8 (*)    All other components within normal limits  URINALYSIS, COMPLETE (UACMP) WITH MICROSCOPIC - Abnormal; Notable for the following components:   Color, Urine AMBER (*)    APPearance CLEAR (*)    Specific Gravity, Urine >1.046 (*)    Hgb urine dipstick SMALL (*)    Bilirubin Urine MODERATE (*)    Ketones, ur 5 (*)    Bacteria, UA RARE (*)    All other  components within normal limits  BILIRUBIN, DIRECT - Abnormal; Notable for the following components:   Bilirubin, Direct 3.7 (*)    All other components within normal limits  ACETAMINOPHEN LEVEL -  Abnormal; Notable for the following components:   Acetaminophen (Tylenol), Serum <10 (*)    All other components within normal limits  RESP PANEL BY RT-PCR (FLU A&B, COVID) ARPGX2  CK  PROTIME-INR  TROPONIN I (HIGH SENSITIVITY)  TROPONIN I (HIGH SENSITIVITY)   ____________________________________________  EKG  Sinus tachycardia with ventricular rate of 106, normal axis, unremarkable intervals and no clearance of acute ischemia or significant underlying arrhythmia. ____________________________________________  RADIOLOGY  ED MD interpretation: CT abdomen pelvis remarkable for evidence of intra extrahepatic biliary duct dilation with some dilation of the pancreatic duct as well.  No clear stone visualized.  Is also some thickening of the gastric antrum consistent with possible gastritis and evidence of a 3.4 x 3.3 cm abdominal aortic aneurysm without rupture.  No kidney stones hydronephrosis or other abnormalities noted in the abdomen or pelvis.   Official radiology report(s): CT ABDOMEN PELVIS W CONTRAST  Result Date: 08/04/2020 CLINICAL DATA:  Abdominal pain with nausea and vomiting EXAM: CT ABDOMEN AND PELVIS WITH CONTRAST TECHNIQUE: Multidetector CT imaging of the abdomen and pelvis was performed using the standard protocol following bolus administration of intravenous contrast. CONTRAST:  44mL OMNIPAQUE IOHEXOL 300 MG/ML  SOLN COMPARISON:  April 06, 2017 FINDINGS: Lower chest: No lung base edema or consolidation evident. Suggestion of bibasilar underlying emphysematous change. Hepatobiliary: No focal liver lesions are evident. Gallbladder is absent. There is generalized intrahepatic biliary duct dilatation. There is dilatation of the common hepatic and common bile ducts to the level of the ampulla. There is mild enhancement at the level of the ampulla without well-defined mass appreciable. Pancreas: There is pancreatic duct dilatation measuring just under 5 mm at its maximum. No well-defined  pancreatic mass is seen by CT. No peripancreatic fluid. Spleen: No splenic lesions are evident. Adrenals/Urinary Tract: Adrenals bilaterally appear normal. There is a 4 mm apparent cyst in the lower pole right kidney. No evident hydronephrosis on either side. Extrarenal pelvis on each side, an anatomic variant. No appreciable renal or ureteral calculus on either side. Urinary bladder is midline with wall thickness within normal limits. Stomach/Bowel: Moderate stool throughout colon noted. There is wall thickening in the gastric antrum. There is no appreciable small or large bowel wall thickening. No evident bowel obstruction. Terminal ileum appears normal. No evident appendiceal inflammation. No free air or portal venous air. Vascular/Lymphatic: There is aneurysmal dilatation of the distal abdominal aorta extending to the bifurcation with a maximum transverse diameter of 3.4 x 3.3 cm. There is thrombus in the periphery of this aneurysmal dilatation. There are stents in each common iliac artery. Extensive aortic and pelvic arterial atherosclerotic calcification noted. Calcification noted in each proximal renal artery. Major venous structures appear patent. No evident adenopathy in the abdomen or pelvis. Reproductive: Uterus is anteverted.  No adnexal masses evident. Other: No appreciable abscess or ascites in the abdomen or pelvis. Musculoskeletal: No blastic or lytic bone lesions. There are degenerative changes in the lumbar spine. No appreciable abdominal wall or intramuscular lesions. IMPRESSION: 1. Intrahepatic and extrahepatic biliary duct dilatation which is greater than is expected with cholecystectomy state. Mild enhancement is noted at the ampulla. There is also a degree of pancreatic duct dilatation. These findings raise concern for potential neoplastic lesion at or near the ampulla. A well-defined mass is not appreciated by  CT. These findings are sufficiently suspicious for potential lesion in this area to  warrant correlation with abdominal MR pre and post-contrast as well as MRCP to further evaluate. Gallbladder is absent. 2. Wall thickening in the gastric antrum concerning for gastritis in this area. No other bowel wall thickening. No bowel obstruction. No abscess in the abdomen or pelvis. Appendix region appears unremarkable. 3. Abdominal aortic aneurysm with maximum transverse diameter of 3.4 x 3.3 cm. Recommend follow-up ultrasound every 3 years. This recommendation follows ACR consensus guidelines: White Paper of the ACR Incidental Findings Committee II on Vascular Findings. J Am Coll Radiol 2013; 10:789-794. thrombus noted in the periphery of this aneurysm. There are stents in each iliac artery. There is aortic and multifocal pelvic arterial vascular calcification. 4. No renal or ureteral calculus. No hydronephrosis. Urinary bladder wall thickness normal. Electronically Signed   By: Lowella Grip III M.D.   On: 08/04/2020 13:04   MR 3D Recon At Scanner  Result Date: 08/04/2020 CLINICAL DATA:  76 female with intra and extrahepatic biliary duct dilatation. Postcholecystectomy. EXAM: MRI ABDOMEN WITHOUT AND WITH CONTRAST (INCLUDING MRCP) TECHNIQUE: Multiplanar multisequence MR imaging of the abdomen was performed both before and after the administration of intravenous contrast. Heavily T2-weighted images of the biliary and pancreatic ducts were obtained, and three-dimensional MRCP images were rendered by post processing. CONTRAST:  21mL GADAVIST GADOBUTROL 1 MMOL/ML IV SOLN COMPARISON:  CT 08/04/2020, 10/19/2015 FINDINGS: Lower chest:  Lung bases clear Hepatobiliary: Marked intra and extrahepatic biliary duct dilatation. Extrahepatic bile ducts measure up to 13 mm. Common bile duct measures up to 13 mm. No filling defect within the common bile duct. There is abrupt narrowing of the distal common bile duct with near complete loss of the lumen over a 7 mm segment (image 10/MRCP MIP sequence). The most  distal common bile duct reconstitutes to normal caliber (3 mm) over a 26 mm segment leading up to the ampulla. This stricturing is seen on coronal postcontrast T1 weighted imaging (image 40/25). There is enhancing a lesion the RIGHT hepatic lobe (image 14/9) appears to have a vascular pattern. Lesion is subtly evident on more delayed imaging (series 21). Lesion fades to isointense on the 90 second imaging. No additional enhancing hepatic lesions. Pancreas: Within the lateral aspect of the pancreatic head, there is a subtle hypointensity on noncontrast T1 weighted imaging measuring 10 mm x 10 mm on image 41/series 18. There is mild pancreatic duct dilatation proximal to this lesion on same image. The duct is lost through this region. On the delayed vascular imaging this same region is more difficult to define. There subtle ductal interruption in the same vicinity of the stricturing of the common bile duct. (Image 43/24 ) No periportal or peripancreatic lymphadenopathy. Spleen: Normal spleen. Adrenals/urinary tract: Adrenal glands and kidneys are normal. Stomach/Bowel: Stomach and limited of the small bowel is unremarkable Vascular/Lymphatic: Abdominal aortic normal caliber. No retroperitoneal periportal lymphadenopathy. Musculoskeletal: No aggressive osseous lesion IMPRESSION: 1. 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 Differential would include benign and malignant stricturing of the distal common bile duct versus is external mass lesion constricting the duct. Benign stricturing would include inflammation versus cholangiocarcinoma. External compression differential would include pancreatic adenocarcinoma. 2. Subtle lesion in the head of the pancreas. Subtle interruption of the pancreatic duct through the head of the pancreas. These findings favor a pancreatic neoplasm (adenocarcinoma) as source of ductal obstruction. 3. Enhancing lesion the RIGHT  hepatic  lobe is favored benign vascular lesion. 4. No lymphadenopathy identified. 5. Recommend endoscopic ultrasound for evaluation of the pancreatic head / distal common bile duct. Electronically Signed   By: Suzy Bouchard M.D.   On: 08/04/2020 15:09   MR ABDOMEN MRCP W WO CONTAST  Result Date: 08/04/2020 CLINICAL DATA:  31 female with intra and extrahepatic biliary duct dilatation. Postcholecystectomy. EXAM: MRI ABDOMEN WITHOUT AND WITH CONTRAST (INCLUDING MRCP) TECHNIQUE: Multiplanar multisequence MR imaging of the abdomen was performed both before and after the administration of intravenous contrast. Heavily T2-weighted images of the biliary and pancreatic ducts were obtained, and three-dimensional MRCP images were rendered by post processing. CONTRAST:  39mL GADAVIST GADOBUTROL 1 MMOL/ML IV SOLN COMPARISON:  CT 08/04/2020, 10/19/2015 FINDINGS: Lower chest:  Lung bases clear Hepatobiliary: Marked intra and extrahepatic biliary duct dilatation. Extrahepatic bile ducts measure up to 13 mm. Common bile duct measures up to 13 mm. No filling defect within the common bile duct. There is abrupt narrowing of the distal common bile duct with near complete loss of the lumen over a 7 mm segment (image 10/MRCP MIP sequence). The most distal common bile duct reconstitutes to normal caliber (3 mm) over a 26 mm segment leading up to the ampulla. This stricturing is seen on coronal postcontrast T1 weighted imaging (image 40/25). There is enhancing a lesion the RIGHT hepatic lobe (image 14/9) appears to have a vascular pattern. Lesion is subtly evident on more delayed imaging (series 21). Lesion fades to isointense on the 90 second imaging. No additional enhancing hepatic lesions. Pancreas: Within the lateral aspect of the pancreatic head, there is a subtle hypointensity on noncontrast T1 weighted imaging measuring 10 mm x 10 mm on image 41/series 18. There is mild pancreatic duct dilatation proximal to this lesion  on same image. The duct is lost through this region. On the delayed vascular imaging this same region is more difficult to define. There subtle ductal interruption in the same vicinity of the stricturing of the common bile duct. (Image 43/24 ) No periportal or peripancreatic lymphadenopathy. Spleen: Normal spleen. Adrenals/urinary tract: Adrenal glands and kidneys are normal. Stomach/Bowel: Stomach and limited of the small bowel is unremarkable Vascular/Lymphatic: Abdominal aortic normal caliber. No retroperitoneal periportal lymphadenopathy. Musculoskeletal: No aggressive osseous lesion IMPRESSION: 1. 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 Differential would include benign and malignant stricturing of the distal common bile duct versus is external mass lesion constricting the duct. Benign stricturing would include inflammation versus cholangiocarcinoma. External compression differential would include pancreatic adenocarcinoma. 2. Subtle lesion in the head of the pancreas. Subtle interruption of the pancreatic duct through the head of the pancreas. These findings favor a pancreatic neoplasm (adenocarcinoma) as source of ductal obstruction. 3. Enhancing lesion the RIGHT hepatic lobe is favored benign vascular lesion. 4. No lymphadenopathy identified. 5. Recommend endoscopic ultrasound for evaluation of the pancreatic head / distal common bile duct. Electronically Signed   By: Suzy Bouchard M.D.   On: 08/04/2020 15:09    ____________________________________________   PROCEDURES  Procedure(s) performed (including Critical Care):  .1-3 Lead EKG Interpretation Performed by: Lucrezia Starch, MD Authorized by: Lucrezia Starch, MD     Interpretation: normal     ECG rate assessment: normal     Rhythm: sinus rhythm     Ectopy: none     Conduction: normal       ____________________________________________   INITIAL IMPRESSION /  ASSESSMENT AND PLAN / ED  COURSE        Patient presents with above-stated history and exam for assessment of acute on chronic abdominal pain associate worsening nausea and diarrhea and vomiting.  On arrival she is afebrile and hemodynamically stable.  She does appear jaundiced and has some diffuse abdominal tenderness without significant CVA tenderness.  Differential includes gastritis, pancreatitis,  diverticulitis, kidney stones and cystitis.  CT abdomen pelvis remarkable for evidence of intra extrahepatic biliary duct dilation with some dilation of the pancreatic duct as well.  No clear stone visualized.  Is also some thickening of the gastric antrum consistent with possible gastritis and evidence of a 3.4 x 3.3 cm abdominal aortic aneurysm without rupture.  No kidney stones hydronephrosis or other abnormalities noted in the abdomen or pelvis.   Lipase of 94 not greater than 3 times upper limit of normal consistent with acute pancreatitis.  CMP without significant metabolic derangements but evidence of cholestasis with transaminitis AST of 135 and ALT of 398 as well as alk phos of 205 and T bili of 5.6.  CBC without leukocytosis or acute anemia.  Troponin is nonelevated x2 not consistent with atypical presentation of ACS.  CK is unremarkable.  Tylenol levels undetectable.  CK unremarkable.  INR WNL.  Overall presentation is most consistent with cholestasis concerning possibly from oncological source.  Discussed with on-call gastroenterologist Dr. Bonna Gains recommended MRCP and hospitalist admission with plan for likely ERCP tomorrow.  MRCP ordered.  Patient admitted in stable condition to hospital service.  Discussed with patient concern for neoplasm as cause of her symptoms and presentation today.        ____________________________________________   FINAL CLINICAL IMPRESSION(S) / ED DIAGNOSES  Final diagnoses:  Cholestasis  Bilirubinemia  Transaminitis  Abdominal aortic aneurysm  (AAA) without rupture (HCC)    Medications  lactated ringers bolus 1,000 mL (0 mLs Intravenous Stopped 08/04/20 1527)  ondansetron (ZOFRAN) injection 4 mg (4 mg Intravenous Given 08/04/20 1144)  fentaNYL (SUBLIMAZE) injection 50 mcg (50 mcg Intravenous Given 08/04/20 1350)  iohexol (OMNIPAQUE) 300 MG/ML solution 75 mL (75 mLs Intravenous Contrast Given 08/04/20 1223)  gadobutrol (GADAVIST) 1 MMOL/ML injection 5 mL (5 mLs Intravenous Contrast Given 08/04/20 1427)     ED Discharge Orders    None       Note:  This document was prepared using Dragon voice recognition software and may include unintentional dictation errors.   Lucrezia Starch, MD 08/04/20 [phone]

## 2020-08-04 NOTE — ED Notes (Signed)
Patient resting on stretcher, awaiting MRI results, GI reccomendation and admission to hospital. Updated on plan of care.

## 2020-08-04 NOTE — Consult Note (Signed)
Melodie Bouillon, MD [address], [place], [place], [address] [address], [place], [place], [address] Phone: [phone]  Fax: [phone]  Consultation  Referring Provider:     Dr. Clyde Lundborg Primary Care Physician:  Sherrie Mustache, MD Reason for Consultation:   Abdominal pain, pancreatic mass  Date of Admission:  08/04/2020 Date of Consultation:  08/04/2020         HPI:   Shirley Barton is a 70 y.o. female presents with abdominal pain, that she states has been going on chronically for years, but has been worsening over the last month.  Right upper quadrant, nonradiating, 5 or 10, associated with nausea.  Denies any new medications.  Patient had an upper endoscopy in 2018 with Dr. Marva Panda, abdominal pain that showed gastritis and a hiatal hernia.  Biopsies were negative for H. Pylori.  Patient has had previous cholecystectomy in 2017  Past Medical History:  Diagnosis Date  . Abdominal pain, generalized   . Anxiety   . Atypical chest pain   . Back pain   . Basal cell carcinoma of skin 2013   resected from Left scalp area.   Marland Kitchen COPD (chronic obstructive pulmonary disease) (HCC)   . Depression   . DOE (dyspnea on exertion)   . GERD (gastroesophageal reflux disease)   . Hypertension   . Shortness of breath dyspnea     Past Surgical History:  Procedure Laterality Date  . BASAL CELL CARCINOMA EXCISION  2013  . CARPAL TUNNEL RELEASE Left 90s  . CHOLECYSTECTOMY N/A 09/16/2015   Procedure: LAPAROSCOPIC CHOLECYSTECTOMY WITH INTRAOPERATIVE CHOLANGIOGRAM;  Surgeon: Earline Mayotte, MD;  Location: [facility] ORS;  Service: General;  Laterality: N/A;  . COLONOSCOPY  1990"s   [place]  . CORONARY ARTERY BYPASS GRAFT    . ESOPHAGOGASTRODUODENOSCOPY (EGD) WITH PROPOFOL N/A 02/05/2017   Procedure: ESOPHAGOGASTRODUODENOSCOPY (EGD) WITH PROPOFOL;  Surgeon: Christena Deem, MD;  Location: [facility] ENDOSCOPY;  Service: Endoscopy;  Laterality: N/A;  . PERIPHERAL VASCULAR  CATHETERIZATION Bilateral 12/27/2015   Procedure: Lower Extremity Angiography;  Surgeon: Renford Dills, MD;  Location: [facility] INVASIVE CV LAB;  Service: Cardiovascular;  Laterality: Bilateral;  . TONSILLECTOMY      Prior to Admission medications   Medication Sig Start Date End Date Taking? Authorizing Provider  acetaminophen (TYLENOL) 325 MG tablet Take 650 mg by mouth daily.   Yes [provider]  cetirizine (ZYRTEC) 10 MG tablet Take 10 mg by mouth daily. 03/18/18  Yes [provider]  Fluticasone-Salmeterol (ADVAIR) 100-50 MCG/DOSE AEPB Inhale 1 puff into the lungs 2 (two) times daily.   Yes [provider]  ondansetron (ZOFRAN ODT) 4 MG disintegrating tablet Take 1 tablet (4 mg total) by mouth every 6 (six) hours as needed for nausea or vomiting. 08/28/15  Yes Sharyn Creamer, MD  pantoprazole (PROTONIX) 40 MG tablet Take by mouth. 09/07/15  Yes [provider]  cyanocobalamin 2000 MCG tablet Take 2,000 mcg daily by mouth. Patient not taking: Reported on 08/04/2020    [provider]  cyclobenzaprine (FLEXERIL) 5 MG tablet Take 1 tablet (5 mg total) by mouth 3 (three) times daily as needed for muscle spasms. Patient not taking: Reported on 08/04/2020 03/17/18   Menshew, Charlesetta Ivory, PA-C  dicyclomine (BENTYL) 10 MG capsule Take 10 mg 4 (four) times daily -  before meals and at bedtime by mouth. Patient not taking: Reported on 08/04/2020    [provider]  ipratropium (ATROVENT) 0.03 % nasal  spray Place 2 sprays every 12 (twelve) hours into both nostrils. Patient not taking: Reported on 08/04/2020    [provider]  ketorolac (TORADOL) 10 MG tablet Take 1 tablet (10 mg total) by mouth every 8 (eight) hours. Patient not taking: Reported on 08/04/2020 03/17/18   Menshew, Dannielle Karvonen, PA-C  metoCLOPramide (REGLAN) 5 MG tablet Take 1 tablet (5 mg total) by mouth 2 (two) times daily. Patient not taking: Reported on 08/04/2020 10/06/15   Robert Bellow, MD  mometasone (ELOCON) 0.1 % lotion Apply daily topically. Patient not taking: Reported on 08/04/2020    [provider]  promethazine (PHENERGAN) 12.5 MG tablet Take 1 tablet (12.5 mg total) by mouth every 6 (six) hours as needed for nausea or vomiting. Patient not taking: Reported on 08/04/2020 04/06/17   Merlyn Lot, MD  sertraline (ZOLOFT) 50 MG tablet Take 1 tablet (50 mg total) by mouth daily with breakfast. Patient not taking: Reported on 08/04/2020 10/28/18   Ursula Alert, MD  sucralfate (CARAFATE) 1 g tablet Take 1 g 4 (four) times daily -  with meals and at bedtime by mouth. Patient not taking: Reported on 08/04/2020    [provider]  traZODone (DESYREL) 50 MG tablet TAKE 1/2 TO 1 TABLET BY MOUTH AT BEDTIME AS NEEDED FOR SLEEP Patient not taking: Reported on 08/04/2020 09/25/18   Ursula Alert, MD  Vitamin D, Ergocalciferol, (DRISDOL) 1.25 MG (50000 UT) CAPS capsule TAKE 1 CAPSULE BY MOUTH ONE TIME PER WEEK Patient not taking: Reported on 08/04/2020 06/25/18   [provider]    Family History  Problem Relation Age of Onset  . Cerebral palsy Daughter   . Colon polyps Sister   . Leukemia Daughter   . Depression Son   . Breast cancer Neg Hx      Social History   Tobacco Use  . Smoking status: Former Smoker    Quit date: 03/02/2012    Years since quitting: 8.4  . Smokeless tobacco: Never Used  Vaping Use  . Vaping Use: Never used  Substance Use Topics  . Alcohol use: No    Alcohol/week: 0.0 standard drinks  . Drug use: No    Allergies as of 08/04/2020 - Review Complete 08/04/2020  Allergen Reaction Noted  . Tramadol  04/10/2012  . Cortisone Rash 04/10/2012  . Cortizone-10 [hydrocortisone] Rash 01/03/2015  . Penicillins Rash 04/10/2012    Review of Systems:    All systems reviewed and negative except where noted in HPI.   Physical Exam:  Vital signs in last 24 hours: Vitals:   08/04/20 1148 08/04/20 1200 08/04/20 1245  08/04/20 1530  BP: (!) 147/77 (!) 150/76 (!) 146/70 136/77  Pulse: 91 88 83 75  Resp: 16  16 16   Temp:      TempSrc:      SpO2: 98% 97% 97% 94%  Weight:      Height:         General:   Pleasant, cooperative in NAD Head:  Normocephalic and atraumatic. Eyes:   No icterus.   Conjunctiva pink. PERRLA. Ears:  Normal auditory acuity. Neck:  Supple; no masses or thyroidomegaly Lungs: Respirations even and unlabored. Lungs clear to auscultation bilaterally.   No wheezes, crackles, or rhonchi.  Abdomen:  Soft, nondistended, nontender. Normal bowel sounds. No appreciable masses or hepatomegaly.  No rebound or guarding.  Neurologic:  Alert and oriented x3;  grossly normal neurologically. Skin:  Intact without significant lesions or rashes. Cervical Nodes:  No significant cervical adenopathy. Psych:  Alert and cooperative. Normal affect.  LAB RESULTS: Recent Labs    08/04/20 0948  WBC 6.7  HGB 15.2*  HCT 46.8*  PLT 228   BMET Recent Labs    08/04/20 0948  NA 138  K 3.8  CL 100  CO2 26  GLUCOSE 104*  BUN <5*  CREATININE 0.52  CALCIUM 9.9   LFT Recent Labs    08/04/20 0948 08/04/20 1138  PROT 7.8  --   ALBUMIN 4.2  --   AST 155*  --   ALT 398*  --   ALKPHOS 205*  --   BILITOT 5.6*  --   BILIDIR  --  3.7*   PT/INR Recent Labs    08/04/20 1138  LABPROT 12.3  INR 0.9    STUDIES: CT ABDOMEN PELVIS W CONTRAST  Result Date: 08/04/2020 CLINICAL DATA:  Abdominal pain with nausea and vomiting EXAM: CT ABDOMEN AND PELVIS WITH CONTRAST TECHNIQUE: Multidetector CT imaging of the abdomen and pelvis was performed using the standard protocol following bolus administration of intravenous contrast. CONTRAST:  74mL OMNIPAQUE IOHEXOL 300 MG/ML  SOLN COMPARISON:  April 06, 2017 FINDINGS: Lower chest: No lung base edema or consolidation evident. Suggestion of bibasilar underlying emphysematous change. Hepatobiliary: No focal liver lesions are evident. Gallbladder is absent. There is  generalized intrahepatic biliary duct dilatation. There is dilatation of the common hepatic and common bile ducts to the level of the ampulla. There is mild enhancement at the level of the ampulla without well-defined mass appreciable. Pancreas: There is pancreatic duct dilatation measuring just under 5 mm at its maximum. No well-defined pancreatic mass is seen by CT. No peripancreatic fluid. Spleen: No splenic lesions are evident. Adrenals/Urinary Tract: Adrenals bilaterally appear normal. There is a 4 mm apparent cyst in the lower pole right kidney. No evident hydronephrosis on either side. Extrarenal pelvis on each side, an anatomic variant. No appreciable renal or ureteral calculus on either side. Urinary bladder is midline with wall thickness within normal limits. Stomach/Bowel: Moderate stool throughout colon noted. There is wall thickening in the gastric antrum. There is no appreciable small or large bowel wall thickening. No evident bowel obstruction. Terminal ileum appears normal. No evident appendiceal inflammation. No free air or portal venous air. Vascular/Lymphatic: There is aneurysmal dilatation of the distal abdominal aorta extending to the bifurcation with a maximum transverse diameter of 3.4 x 3.3 cm. There is thrombus in the periphery of this aneurysmal dilatation. There are stents in each common iliac artery. Extensive aortic and pelvic arterial atherosclerotic calcification noted. Calcification noted in each proximal renal artery. Major venous structures appear patent. No evident adenopathy in the abdomen or pelvis. Reproductive: Uterus is anteverted.  No adnexal masses evident. Other: No appreciable abscess or ascites in the abdomen or pelvis. Musculoskeletal: No blastic or lytic bone lesions. There are degenerative changes in the lumbar spine. No appreciable abdominal wall or intramuscular lesions. IMPRESSION: 1. Intrahepatic and extrahepatic biliary duct dilatation which is greater than is  expected with cholecystectomy state. Mild enhancement is noted at the ampulla. There is also a degree of pancreatic duct dilatation. These findings raise concern for potential neoplastic lesion at or near the ampulla. A well-defined mass is not appreciated by CT. These findings are sufficiently suspicious for potential lesion in this area to warrant correlation with abdominal MR pre and post-contrast as well as MRCP to further evaluate. Gallbladder is absent. 2. Wall thickening in the gastric antrum concerning for gastritis in  this area. No other bowel wall thickening. No bowel obstruction. No abscess in the abdomen or pelvis. Appendix region appears unremarkable. 3. Abdominal aortic aneurysm with maximum transverse diameter of 3.4 x 3.3 cm. Recommend follow-up ultrasound every 3 years. This recommendation follows ACR consensus guidelines: White Paper of the ACR Incidental Findings Committee II on Vascular Findings. J Am Coll Radiol 2013; 10:789-794. thrombus noted in the periphery of this aneurysm. There are stents in each iliac artery. There is aortic and multifocal pelvic arterial vascular calcification. 4. No renal or ureteral calculus. No hydronephrosis. Urinary bladder wall thickness normal. Electronically Signed   By: Lowella Grip III M.D.   On: 08/04/2020 13:04   MR 3D Recon At Scanner  Result Date: 08/04/2020 CLINICAL DATA:  74 female with intra and extrahepatic biliary duct dilatation. Postcholecystectomy. EXAM: MRI ABDOMEN WITHOUT AND WITH CONTRAST (INCLUDING MRCP) TECHNIQUE: Multiplanar multisequence MR imaging of the abdomen was performed both before and after the administration of intravenous contrast. Heavily T2-weighted images of the biliary and pancreatic ducts were obtained, and three-dimensional MRCP images were rendered by post processing. CONTRAST:  52mL GADAVIST GADOBUTROL 1 MMOL/ML IV SOLN COMPARISON:  CT 08/04/2020, 10/19/2015 FINDINGS: Lower chest:  Lung bases clear  Hepatobiliary: Marked intra and extrahepatic biliary duct dilatation. Extrahepatic bile ducts measure up to 13 mm. Common bile duct measures up to 13 mm. No filling defect within the common bile duct. There is abrupt narrowing of the distal common bile duct with near complete loss of the lumen over a 7 mm segment (image 10/MRCP MIP sequence). The most distal common bile duct reconstitutes to normal caliber (3 mm) over a 26 mm segment leading up to the ampulla. This stricturing is seen on coronal postcontrast T1 weighted imaging (image 40/25). There is enhancing a lesion the RIGHT hepatic lobe (image 14/9) appears to have a vascular pattern. Lesion is subtly evident on more delayed imaging (series 21). Lesion fades to isointense on the 90 second imaging. No additional enhancing hepatic lesions. Pancreas: Within the lateral aspect of the pancreatic head, there is a subtle hypointensity on noncontrast T1 weighted imaging measuring 10 mm x 10 mm on image 41/series 18. There is mild pancreatic duct dilatation proximal to this lesion on same image. The duct is lost through this region. On the delayed vascular imaging this same region is more difficult to define. There subtle ductal interruption in the same vicinity of the stricturing of the common bile duct. (Image 43/24 ) No periportal or peripancreatic lymphadenopathy. Spleen: Normal spleen. Adrenals/urinary tract: Adrenal glands and kidneys are normal. Stomach/Bowel: Stomach and limited of the small bowel is unremarkable Vascular/Lymphatic: Abdominal aortic normal caliber. No retroperitoneal periportal lymphadenopathy. Musculoskeletal: No aggressive osseous lesion IMPRESSION: 1. 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 Differential would include benign and malignant stricturing of the distal common bile duct versus is external mass lesion constricting the duct. Benign stricturing would include inflammation  versus cholangiocarcinoma. External compression differential would include pancreatic adenocarcinoma. 2. Subtle lesion in the head of the pancreas. Subtle interruption of the pancreatic duct through the head of the pancreas. These findings favor a pancreatic neoplasm (adenocarcinoma) as source of ductal obstruction. 3. Enhancing lesion the RIGHT hepatic lobe is favored benign vascular lesion. 4. No lymphadenopathy identified. 5. Recommend endoscopic ultrasound for evaluation of the pancreatic head / distal common bile duct. Electronically Signed   By: Suzy Bouchard M.D.   On: 08/04/2020 15:09   MR ABDOMEN  MRCP W WO CONTAST  Result Date: 08/04/2020 CLINICAL DATA:  72 female with intra and extrahepatic biliary duct dilatation. Postcholecystectomy. EXAM: MRI ABDOMEN WITHOUT AND WITH CONTRAST (INCLUDING MRCP) TECHNIQUE: Multiplanar multisequence MR imaging of the abdomen was performed both before and after the administration of intravenous contrast. Heavily T2-weighted images of the biliary and pancreatic ducts were obtained, and three-dimensional MRCP images were rendered by post processing. CONTRAST:  63mL GADAVIST GADOBUTROL 1 MMOL/ML IV SOLN COMPARISON:  CT 08/04/2020, 10/19/2015 FINDINGS: Lower chest:  Lung bases clear Hepatobiliary: Marked intra and extrahepatic biliary duct dilatation. Extrahepatic bile ducts measure up to 13 mm. Common bile duct measures up to 13 mm. No filling defect within the common bile duct. There is abrupt narrowing of the distal common bile duct with near complete loss of the lumen over a 7 mm segment (image 10/MRCP MIP sequence). The most distal common bile duct reconstitutes to normal caliber (3 mm) over a 26 mm segment leading up to the ampulla. This stricturing is seen on coronal postcontrast T1 weighted imaging (image 40/25). There is enhancing a lesion the RIGHT hepatic lobe (image 14/9) appears to have a vascular pattern. Lesion is subtly evident on more delayed  imaging (series 21). Lesion fades to isointense on the 90 second imaging. No additional enhancing hepatic lesions. Pancreas: Within the lateral aspect of the pancreatic head, there is a subtle hypointensity on noncontrast T1 weighted imaging measuring 10 mm x 10 mm on image 41/series 18. There is mild pancreatic duct dilatation proximal to this lesion on same image. The duct is lost through this region. On the delayed vascular imaging this same region is more difficult to define. There subtle ductal interruption in the same vicinity of the stricturing of the common bile duct. (Image 43/24 ) No periportal or peripancreatic lymphadenopathy. Spleen: Normal spleen. Adrenals/urinary tract: Adrenal glands and kidneys are normal. Stomach/Bowel: Stomach and limited of the small bowel is unremarkable Vascular/Lymphatic: Abdominal aortic normal caliber. No retroperitoneal periportal lymphadenopathy. Musculoskeletal: No aggressive osseous lesion IMPRESSION: 1. 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 Differential would include benign and malignant stricturing of the distal common bile duct versus is external mass lesion constricting the duct. Benign stricturing would include inflammation versus cholangiocarcinoma. External compression differential would include pancreatic adenocarcinoma. 2. Subtle lesion in the head of the pancreas. Subtle interruption of the pancreatic duct through the head of the pancreas. These findings favor a pancreatic neoplasm (adenocarcinoma) as source of ductal obstruction. 3. Enhancing lesion the RIGHT hepatic lobe is favored benign vascular lesion. 4. No lymphadenopathy identified. 5. Recommend endoscopic ultrasound for evaluation of the pancreatic head / distal common bile duct. Electronically Signed   By: Suzy Bouchard M.D.   On: 08/04/2020 15:09      Impression / Plan:   Shirley Barton is a 70 y.o. y/o female with acute on  chronic abdominal pain, right upper quadrant, with elevated liver enzymes with abrupt stricturing of the distal common bile duct on imaging  Given elevated liver enzymes, stricturing or bile duct, patient will need ERCP for stent placement  Differentials include pancreatic mass patient may need an EUS in the near future for tissue diagnosis if ERCP is unable to obtain adequate tissue with brushings etc.  EUS is not available at this facility as an inpatient.  No indication for transfer for urgent EUS at this time  Would also recommend oncology consult  No evidence of cholangitis  Patient has  been discussed with Dr. Allen Norris and he is available to perform the ERCP tomorrow  Please page on-call GI with any hemodynamic changes, fever chills, or any evidence of ascending cholangitis, and antibiotics may be needed at that time, versus earlier ERCP or both  N.p.o. past midnight   Thank you for involving me in the care of this patient.      LOS: 0 days   Virgel Manifold, MD  08/04/2020, 5:13 PM

## 2020-08-05 ENCOUNTER — Encounter: Payer: Self-pay | Admitting: Internal Medicine

## 2020-08-05 ENCOUNTER — Encounter
Admission: EM | Disposition: A | Discharge status: Home / Self Care | Payer: Self-pay | Source: Home / Self Care | Attending: Internal Medicine

## 2020-08-05 ENCOUNTER — Inpatient Hospital Stay: Payer: Medicare (Managed Care)

## 2020-08-05 ENCOUNTER — Inpatient Hospital Stay: Payer: Medicare (Managed Care) | Admitting: Certified Registered"

## 2020-08-05 DIAGNOSIS — K831 Obstruction of bile duct: Secondary | ICD-10-CM | POA: Diagnosis not present

## 2020-08-05 DIAGNOSIS — K838 Other specified diseases of biliary tract: Secondary | ICD-10-CM

## 2020-08-05 DIAGNOSIS — K8689 Other specified diseases of pancreas: Secondary | ICD-10-CM | POA: Diagnosis not present

## 2020-08-05 DIAGNOSIS — I714 Abdominal aortic aneurysm, without rupture: Secondary | ICD-10-CM | POA: Diagnosis not present

## 2020-08-05 DIAGNOSIS — R1084 Generalized abdominal pain: Secondary | ICD-10-CM | POA: Diagnosis not present

## 2020-08-05 HISTORY — PX: ERCP: SHX5425

## 2020-08-05 LAB — APTT: aPTT: 45 seconds — ABNORMAL HIGH (ref 24–36)

## 2020-08-05 LAB — HIV ANTIBODY (ROUTINE TESTING W REFLEX): HIV Screen 4th Generation wRfx: NONREACTIVE

## 2020-08-05 SURGERY — ERCP, WITH INTERVENTION IF INDICATED
Anesthesia: General

## 2020-08-05 MED ORDER — TRAZODONE HCL 50 MG PO TABS: 25.0000 mg | ORAL_TABLET | Freq: Every evening | ORAL | Status: DC | PRN

## 2020-08-05 MED ORDER — SODIUM CHLORIDE 0.9 % IV SOLN
6.2500 mg | Freq: Once | INTRAVENOUS | Status: DC
  Filled 2020-08-05: qty 0.25

## 2020-08-05 MED ORDER — PROMETHAZINE HCL 25 MG/ML IJ SOLN
INTRAMUSCULAR | Status: AC
  Administered 2020-08-05: 6.25 mg via INTRAVENOUS
  Filled 2020-08-05: qty 1

## 2020-08-05 MED ORDER — FENTANYL CITRATE (PF) 100 MCG/2ML IJ SOLN
INTRAMUSCULAR | Status: AC
  Administered 2020-08-05: 25 ug via INTRAVENOUS
  Filled 2020-08-05: qty 2

## 2020-08-05 MED ORDER — INDOMETHACIN 50 MG RE SUPP
RECTAL | Status: AC
  Filled 2020-08-05: qty 2

## 2020-08-05 MED ORDER — SODIUM CHLORIDE FLUSH 0.9 % IV SOLN
INTRAVENOUS | Status: AC
  Filled 2020-08-05: qty 10

## 2020-08-05 MED ORDER — SODIUM CHLORIDE 0.9 % IV SOLN: INTRAVENOUS | Status: DC

## 2020-08-05 MED ORDER — LACTATED RINGERS IV SOLN: INTRAVENOUS | Status: DC | PRN

## 2020-08-05 MED ORDER — ONDANSETRON HCL 4 MG/2ML IJ SOLN
INTRAMUSCULAR | Status: DC | PRN
  Administered 2020-08-05: 4 mg via INTRAVENOUS

## 2020-08-05 MED ORDER — SUCCINYLCHOLINE CHLORIDE 20 MG/ML IJ SOLN
INTRAMUSCULAR | Status: DC | PRN
  Administered 2020-08-05: 100 mg via INTRAVENOUS

## 2020-08-05 MED ORDER — BOOST / RESOURCE BREEZE PO LIQD CUSTOM
1.0000 | Freq: Three times a day (TID) | ORAL | Status: DC
  Administered 2020-08-05 – 2020-08-07 (×6): 1 via ORAL

## 2020-08-05 MED ORDER — FENTANYL CITRATE (PF) 100 MCG/2ML IJ SOLN
INTRAMUSCULAR | Status: AC
  Filled 2020-08-05: qty 2

## 2020-08-05 MED ORDER — INDOMETHACIN 50 MG RE SUPP
100.0000 mg | Freq: Once | RECTAL | Status: AC
  Administered 2020-08-05: 100 mg via RECTAL
  Filled 2020-08-05: qty 2

## 2020-08-05 MED ORDER — IPRATROPIUM BROMIDE 0.03 % NA SOLN: 2.0000 | Freq: Two times a day (BID) | NASAL | Status: DC

## 2020-08-05 MED ORDER — PROPOFOL 10 MG/ML IV BOLUS
INTRAVENOUS | Status: DC | PRN
  Administered 2020-08-05: 150 mg via INTRAVENOUS

## 2020-08-05 MED ORDER — LIDOCAINE HCL (CARDIAC) PF 100 MG/5ML IV SOSY
PREFILLED_SYRINGE | INTRAVENOUS | Status: DC | PRN
  Administered 2020-08-05: 60 mg via INTRAVENOUS

## 2020-08-05 MED ORDER — PANTOPRAZOLE SODIUM 40 MG PO TBEC
40.0000 mg | DELAYED_RELEASE_TABLET | Freq: Every day | ORAL | Status: DC
  Administered 2020-08-05 – 2020-08-07 (×3): 40 mg via ORAL
  Filled 2020-08-05 (×3): qty 1

## 2020-08-05 MED ORDER — ONDANSETRON HCL 4 MG/2ML IJ SOLN
4.0000 mg | Freq: Once | INTRAMUSCULAR | Status: AC | PRN
  Administered 2020-08-05: 4 mg via INTRAVENOUS

## 2020-08-05 MED ORDER — MOMETASONE FURO-FORMOTEROL FUM 100-5 MCG/ACT IN AERO
2.0000 | INHALATION_SPRAY | Freq: Two times a day (BID) | RESPIRATORY_TRACT | Status: DC
  Administered 2020-08-05 – 2020-08-07 (×3): 2 via RESPIRATORY_TRACT
  Filled 2020-08-05: qty 8.8

## 2020-08-05 MED ORDER — PROMETHAZINE HCL 25 MG/ML IJ SOLN: 6.2500 mg | INTRAMUSCULAR | Status: AC | PRN

## 2020-08-05 MED ORDER — FENTANYL CITRATE (PF) 100 MCG/2ML IJ SOLN: 25.0000 ug | INTRAMUSCULAR | Status: DC | PRN

## 2020-08-05 MED ORDER — INDOMETHACIN 50 MG RE SUPP: 100.0000 mg | Freq: Once | RECTAL | Status: DC

## 2020-08-05 MED ORDER — DEXAMETHASONE SODIUM PHOSPHATE 10 MG/ML IJ SOLN
INTRAMUSCULAR | Status: DC | PRN
  Administered 2020-08-05: 10 mg via INTRAVENOUS

## 2020-08-05 MED ORDER — ONDANSETRON HCL 4 MG/2ML IJ SOLN
INTRAMUSCULAR | Status: AC
  Filled 2020-08-05: qty 2

## 2020-08-05 MED ORDER — SUCRALFATE 1 G PO TABS
1.0000 g | ORAL_TABLET | Freq: Three times a day (TID) | ORAL | Status: DC
  Administered 2020-08-05 – 2020-08-06 (×5): 1 g via ORAL
  Filled 2020-08-05 (×6): qty 1

## 2020-08-05 MED ORDER — FENTANYL CITRATE (PF) 100 MCG/2ML IJ SOLN
INTRAMUSCULAR | Status: DC | PRN
  Administered 2020-08-05: 25 ug via INTRAVENOUS
  Administered 2020-08-05: 50 ug via INTRAVENOUS
  Administered 2020-08-05: 25 ug via INTRAVENOUS

## 2020-08-05 NOTE — Anesthesia Preprocedure Evaluation (Signed)
Anesthesia Evaluation  Patient identified by MRN, date of birth, ID band Patient awake    Reviewed: Allergy & Precautions, H&P , NPO status , Patient's Chart, lab work & pertinent test results, reviewed documented beta blocker date and time   Airway Mallampati: II  TM Distance: >3 FB Neck ROM: full    Dental  (+) Teeth Intact   Pulmonary neg pulmonary ROS, shortness of breath and with exertion, COPD, former smoker,    Pulmonary exam normal        Cardiovascular Exercise Tolerance: Poor hypertension, On Medications + DOE  negative cardio ROS Normal cardiovascular exam Rhythm:regular Rate:Normal     Neuro/Psych PSYCHIATRIC DISORDERS Anxiety Depression negative neurological ROS  negative psych ROS   GI/Hepatic negative GI ROS, Neg liver ROS, GERD  Medicated,  Endo/Other  negative endocrine ROS  Renal/GU negative Renal ROS  negative genitourinary   Musculoskeletal   Abdominal   Peds  Hematology negative hematology ROS (+)   Anesthesia Other Findings Past Medical History: No date: Abdominal pain, generalized No date: Anxiety No date: Atypical chest pain No date: Back pain 2013: Basal cell carcinoma of skin     Comment:  resected from Left scalp area.  No date: COPD (chronic obstructive pulmonary disease) (HCC) No date: Depression No date: DOE (dyspnea on exertion) No date: GERD (gastroesophageal reflux disease) No date: Hypertension No date: Shortness of breath dyspnea Past Surgical History: 2013: BASAL CELL CARCINOMA EXCISION 90s: CARPAL TUNNEL RELEASE; Left 09/16/2015: CHOLECYSTECTOMY; N/A     Comment:  Procedure: LAPAROSCOPIC CHOLECYSTECTOMY WITH               INTRAOPERATIVE CHOLANGIOGRAM;  Surgeon: Robert Bellow, MD;  Location: [facility] ORS;  Service: General;                Laterality: N/A; 1990"s: COLONOSCOPY     Comment:  [place] No date: CORONARY ARTERY BYPASS GRAFT 02/05/2017:  ESOPHAGOGASTRODUODENOSCOPY (EGD) WITH PROPOFOL; N/A     Comment:  Procedure: ESOPHAGOGASTRODUODENOSCOPY (EGD) WITH               PROPOFOL;  Surgeon: Lollie Sails, MD;  Location:               [facility] ENDOSCOPY;  Service: Endoscopy;  Laterality: N/A; 12/27/2015: PERIPHERAL VASCULAR CATHETERIZATION; Bilateral     Comment:  Procedure: Lower Extremity Angiography;  Surgeon:               Katha Cabal, MD;  Location: [place] [facility];                Service: Cardiovascular;  Laterality: Bilateral; No date: TONSILLECTOMY BMI    Body Mass Index: 17.16 kg/m     Reproductive/Obstetrics negative OB ROS                             Anesthesia Physical Anesthesia Plan  ASA: III  Anesthesia Plan: General ETT   Post-op Pain Management:    Induction:   PONV Risk Score and Plan: 4 or greater  Airway Management Planned:   Additional Equipment:   Intra-op Plan:   Post-operative Plan:   Informed Consent: I have reviewed the patients History and Physical, chart, labs and discussed the procedure including the risks, benefits and alternatives for the proposed anesthesia with the patient or authorized representative who has indicated his/her understanding and acceptance.  Dental Advisory Given  Plan Discussed with: CRNA  Anesthesia Plan Comments:         Anesthesia Quick Evaluation

## 2020-08-05 NOTE — Progress Notes (Signed)
Pt states nausea is somewhat better but still there. Pt states pain is a 10 but has refused anymore pain medicine at this time. Dr. Rosey Bath notified of nausea. Acknowledged. Orders received.

## 2020-08-05 NOTE — Anesthesia Postprocedure Evaluation (Signed)
Anesthesia Post Note  Patient: Shirley Barton  Procedure(s) Performed: ENDOSCOPIC RETROGRADE CHOLANGIOPANCREATOGRAPHY (ERCP) (N/A )  Patient location during evaluation: PACU Anesthesia Type: General Level of consciousness: awake and alert Pain management: pain level controlled Vital Signs Assessment: post-procedure vital signs reviewed and stable Respiratory status: spontaneous breathing, nonlabored ventilation, respiratory function stable and patient connected to nasal cannula oxygen Cardiovascular status: blood pressure returned to baseline and stable Postop Assessment: no apparent nausea or vomiting Anesthetic complications: no   No complications documented.   Last Vitals:  Vitals:   08/05/20 1333 08/05/20 1345  BP: (!) 115/105 (!) 141/82  Pulse: 88 88  Resp: 16 16  Temp: 36.6 C   SpO2: 100% 99%    Last Pain:  Vitals:   08/05/20 1333  TempSrc:   PainSc: Asleep                 Molli Barrows

## 2020-08-05 NOTE — Progress Notes (Signed)
Initial Nutrition Assessment  DOCUMENTATION CODES:  Underweight  INTERVENTION:   Advance diet as able per GI and pt tolerance  Boost Breeze po TID, each supplement provides 250 kcal and 9 grams of protein  Request new measured weight to assess for weight loss  NUTRITION DIAGNOSIS:  Inadequate oral intake related to poor appetite as evidenced by per patient/family report.  GOAL:  Patient will meet greater than or equal to 90% of their needs  MONITOR:  PO intake,Diet advancement  REASON FOR ASSESSMENT:  Malnutrition Screening Tool    ASSESSMENT:  Pt  Presented to ED 5/5 for two weeks of worsening abdominal pain associated with poor appetite, n/v with attempted PO intake, and persistent diarrhea. Workup in ED most consistent with cholestasis. GI consulted. PMH includes COPD, HTN, GERD, depression, PAD, chronic abdominal pain.  Pt out of room for ERCP at the time of assessment. No recorded intake this admission but pt reports decreased appetite and weight loss in admission screen. Limited recent weight hx to review, but some is noted in the last few years. Will add nutrition supplements to augment intake. Will also request new weight to assess accuracy of current recorded weight.  5/6 - ERCP, impression: single segmental biliary stricture was found in the middle third of the main bile duct, the entire biliary tree was dilated.  Relevant Scheduled Meds: . pantoprazole  40 mg Oral Daily  . sucralfate  1 g Oral TID WC & HS   Relevant Continuous Infusions: . lactated ringers 75 mL/hr at 08/04/20 1831   Relevant PRN Meds: ondansetron   Labs reviewed  NUTRITION - FOCUSED PHYSICAL EXAM: Defer to in-person assessment  Diet Order:   Diet Order            Diet clear liquid Room service appropriate? Yes; Fluid consistency: Thin  Diet effective now                EDUCATION NEEDS:  No education needs have been identified at this time  Skin:  Skin Assessment: Reviewed RN  Assessment  Last BM:  unsure  Height:  Ht Readings from Last 1 Encounters:  08/04/20 5\' 4"  (1.626 m)    Weight:  Wt Readings from Last 1 Encounters:  08/04/20 45.4 kg    Ideal Body Weight:  54.5 kg  BMI:  Body mass index is 17.16 kg/m.  Estimated Nutritional Needs:   Kcal:  1400-1600 kcal/d  Protein:  75-85 g/d  Fluid:  >1531mL/d   Ranell Patrick, RD, LDN Clinical Dietitian Pager on [facility]

## 2020-08-05 NOTE — Anesthesia Procedure Notes (Signed)
Procedure Name: Intubation Date/Time: 08/05/2020 1:05 PM Performed by: Jerrye Noble, CRNA Pre-anesthesia Checklist: Patient identified, Emergency Drugs available, Suction available and Patient being monitored Patient Re-evaluated:Patient Re-evaluated prior to induction Oxygen Delivery Method: Circle system utilized Preoxygenation: Pre-oxygenation with 100% oxygen Induction Type: IV induction Ventilation: Mask ventilation without difficulty Laryngoscope Size: McGraph and 3 Grade View: Grade I Tube type: Oral Tube size: 7.0 mm Number of attempts: 1 Airway Equipment and Method: Stylet and Oral airway Placement Confirmation: ETT inserted through vocal cords under direct vision,  positive ETCO2 and breath sounds checked- equal and bilateral Secured at: 22 cm Tube secured with: Tape Dental Injury: Teeth and Oropharynx as per pre-operative assessment

## 2020-08-05 NOTE — Consult Note (Signed)
[facility] NOTE  Patient Care Team: Casilda Carls, MD as PCP - General (Internal Medicine) Rico Junker, RN as Registered Nurse Theodore Demark, RN as Registered Nurse Ezzard Standing, PA-C as Physician Assistant (Gastroenterology) Bary Castilla Forest Gleason, MD (General Surgery)  CHIEF COMPLAINTS/PURPOSE OF CONSULTATION: Pancreatic mass  HISTORY OF PRESENTING ILLNESS:  Shirley Barton 71 y.o.  female with no personal history of malignancy; history of smoking with COPD is currently admitted to hospital for worsening abdominal pain/on imaging noted to have pancreatic mass.  Patient admits to chronic abdominal pain however worsening recently over the last few months.  Patient noted to have worsening weight loss.  Complains of poor appetite.  Positive for nausea no vomiting.  On admission-LFT shows bilirubin 3.7 AST 155 ALT 398 alkaline phosphatase 205.  Renal function normal.  CT scan abdomen pelvis on admission showed intrahepatic/extrahepatic dilatation concerning for periampullary/pancreatic malignancy.  MRI/MRCP shows a [place] appearing stricture at the distal common bile duct versus extreme mass.  Subtle lesion noted in the head of pancreas; right hepatic lesion-benign/vascular.  No lymphadenopathy noted.  Patient is resting comfortably.   Review of Systems  Constitutional: Positive for malaise/fatigue and weight loss. Negative for chills, diaphoresis and fever.  HENT: Negative for nosebleeds and sore throat.   Eyes: Negative for double vision.  Respiratory: Negative for cough, hemoptysis, sputum production, shortness of breath and wheezing.   Cardiovascular: Negative for chest pain, palpitations, orthopnea and leg swelling.  Gastrointestinal: Positive for abdominal pain, nausea and vomiting. Negative for blood in stool, constipation, diarrhea, heartburn and melena.  Genitourinary: Negative for dysuria, frequency and urgency.  Musculoskeletal: Negative for back  pain and joint pain.  Skin: Negative.  Negative for itching and rash.  Neurological: Negative for dizziness, tingling, focal weakness, weakness and headaches.  Endo/Heme/Allergies: Does not bruise/bleed easily.  Psychiatric/Behavioral: Negative for depression. The patient is not nervous/anxious and does not have insomnia.      MEDICAL HISTORY:  Past Medical History:  Diagnosis Date  . Abdominal pain, generalized   . Anxiety   . Atypical chest pain   . Back pain   . Basal cell carcinoma of skin 2013   resected from Left scalp area.   Marland Kitchen COPD (chronic obstructive pulmonary disease) ([place])   . Depression   . DOE (dyspnea on exertion)   . GERD (gastroesophageal reflux disease)   . Hypertension   . Shortness of breath dyspnea     SURGICAL HISTORY: Past Surgical History:  Procedure Laterality Date  . BASAL CELL CARCINOMA EXCISION  2013  . CARPAL TUNNEL RELEASE Left 90s  . CHOLECYSTECTOMY N/A 09/16/2015   Procedure: LAPAROSCOPIC CHOLECYSTECTOMY WITH INTRAOPERATIVE CHOLANGIOGRAM;  Surgeon: Robert Bellow, MD;  Location: [facility] ORS;  Service: General;  Laterality: N/A;  . COLONOSCOPY  [id number]   [place]  . CORONARY ARTERY BYPASS GRAFT    . ESOPHAGOGASTRODUODENOSCOPY (EGD) WITH PROPOFOL N/A 02/05/2017   Procedure: ESOPHAGOGASTRODUODENOSCOPY (EGD) WITH PROPOFOL;  Surgeon: Lollie Sails, MD;  Location: [facility];  Service: Endoscopy;  Laterality: N/A;  . PERIPHERAL VASCULAR CATHETERIZATION Bilateral 12/27/2015   Procedure: Lower Extremity Angiography;  Surgeon: Katha Cabal, MD;  Location: [place] [facility];  Service: Cardiovascular;  Laterality: Bilateral;  . TONSILLECTOMY      SOCIAL HISTORY: Social History   Socioeconomic History  . Marital status: Divorced    Spouse name: Not on file  . Number of children: Not on file  . Years of education: Not on file  .  Highest education level: Not on file  Occupational History  . Occupation: retired  Tobacco Use  .  Smoking status: Former Smoker    Quit date: 03/02/2012    Years since quitting: 8.4  . Smokeless tobacco: Never Used  Vaping Use  . Vaping Use: Never used  Substance and Sexual Activity  . Alcohol use: No    Alcohol/week: 0.0 standard drinks  . Drug use: No  . Sexual activity: Not on file  Other Topics Concern  . Not on file  Social History Narrative   [place]; self; smoker- quit July 08, 2011; daughter died of leukemia-2020. 2 sons [[place]; [place]]; used to be caregiver/ retd.    Social Determinants of Health   Financial Resource Strain: Not on file  Food Insecurity: Not on file  Transportation Needs: Not on file  Physical Activity: Not on file  Stress: Not on file  Social Connections: Not on file  Intimate Partner Violence: Not on file    FAMILY HISTORY: Family History  Problem Relation Age of Onset  . Cerebral palsy Daughter   . Colon polyps Sister   . Leukemia Daughter   . Depression Son   . Breast cancer Neg Hx     ALLERGIES:  is allergic to tramadol, cortisone, cortizone-10 [hydrocortisone], and penicillins.  MEDICATIONS:  Current Facility-Administered Medications  Medication Dose Route Frequency Provider Last Rate Last Admin  . albuterol (VENTOLIN HFA) 108 (90 Base) MCG/ACT inhaler 2 puff  2 puff Inhalation Q4H PRN Lucilla Lame, MD      . dextromethorphan-guaiFENesin (MUCINEX DM) 30-600 MG per 12 hr tablet 1 tablet  1 tablet Oral BID PRN Lucilla Lame, MD      . feeding supplement (BOOST / RESOURCE BREEZE) liquid 1 Container  1 Container Oral TID BM Fritzi Mandes, MD   1 Container at 08/05/20 1724  . hydrALAZINE (APRESOLINE) injection 5 mg  5 mg Intravenous Q2H PRN Lucilla Lame, MD      . indomethacin (INDOCIN) 50 MG suppository           . lactated ringers infusion   Intravenous Continuous Lucilla Lame, MD   Stopped at 08/05/20 1028  . mometasone-formoterol (DULERA) 100-5 MCG/ACT inhaler 2 puff  2 puff Inhalation BID Lucilla Lame, MD      . morphine 2 MG/ML injection 2  mg  2 mg Intravenous Q4H PRN Lucilla Lame, MD   2 mg at 08/05/20 0646  . ondansetron (ZOFRAN) 4 MG/2ML injection           . ondansetron (ZOFRAN) injection 4 mg  4 mg Intravenous Q8H PRN Lucilla Lame, MD   4 mg at 08/05/20 1019  . oxyCODONE (Oxy IR/ROXICODONE) immediate release tablet 5 mg  5 mg Oral Q4H PRN Lucilla Lame, MD   5 mg at 08/05/20 0045  . pantoprazole (PROTONIX) EC tablet 40 mg  40 mg Oral Daily Lucilla Lame, MD   40 mg at 08/05/20 1019  . sodium chloride flush 0.9 % injection           . sucralfate (CARAFATE) tablet 1 g  1 g Oral TID WC & HS Lucilla Lame, MD   1 g at 08/05/20 1724  . traZODone (DESYREL) tablet 25-50 mg  25-50 mg Oral QHS PRN Lucilla Lame, MD          .  PHYSICAL EXAMINATION:  Vitals:   08/05/20 1700 08/05/20 1948  BP: (!) 148/72 (!) 153/102  Pulse: 79 80  Resp: 16 17  Temp: (!) 97.5 F (  36.4 C) 98.6 F (37 C)  SpO2: 94% 94%   Filed Weights   08/04/20 0952  Weight: 100 lb (45.4 kg)    Physical Exam Constitutional:      Comments: Patient resting in the bed comfortably.  Denies any pain.  HENT:     Head: Normocephalic and atraumatic.     Mouth/Throat:     Pharynx: No oropharyngeal exudate.  Eyes:     Pupils: Pupils are equal, round, and reactive to light.  Cardiovascular:     Rate and Rhythm: Normal rate and regular rhythm.  Pulmonary:     Effort: No respiratory distress.     Breath sounds: No wheezing.  Abdominal:     General: Bowel sounds are normal. There is no distension.     Palpations: Abdomen is soft. There is no mass.     Tenderness: There is no abdominal tenderness. There is no guarding or rebound.  Musculoskeletal:        General: No tenderness. Normal range of motion.     Cervical back: Normal range of motion and neck supple.  Skin:    General: Skin is warm.  Neurological:     Mental Status: She is alert and oriented to person, place, and time.  Psychiatric:        Mood and Affect: Affect normal.      LABORATORY DATA:   I have reviewed the data as listed Lab Results  Component Value Date   WBC 6.7 08/04/2020   HGB 15.2 (H) 08/04/2020   HCT 46.8 (H) 08/04/2020   MCV 92.1 08/04/2020   PLT 228 08/04/2020   Recent Labs    08/04/20 0948 08/04/20 1138  NA 138  --   K 3.8  --   CL 100  --   CO2 26  --   GLUCOSE 104*  --   BUN <5*  --   CREATININE 0.52  --   CALCIUM 9.9  --   GFRNONAA >60  --   PROT 7.8  --   ALBUMIN 4.2  --   AST 155*  --   ALT 398*  --   ALKPHOS 205*  --   BILITOT 5.6*  --   BILIDIR  --  3.7*    RADIOGRAPHIC STUDIES: I have personally reviewed the radiological images as listed and agreed with the findings in the report. CT ABDOMEN PELVIS W CONTRAST  Result Date: 08/04/2020 CLINICAL DATA:  Abdominal pain with nausea and vomiting EXAM: CT ABDOMEN AND PELVIS WITH CONTRAST TECHNIQUE: Multidetector CT imaging of the abdomen and pelvis was performed using the standard protocol following bolus administration of intravenous contrast. CONTRAST:  60mL OMNIPAQUE IOHEXOL 300 MG/ML  SOLN COMPARISON:  April 06, 2017 FINDINGS: Lower chest: No lung base edema or consolidation evident. Suggestion of bibasilar underlying emphysematous change. Hepatobiliary: No focal liver lesions are evident. Gallbladder is absent. There is generalized intrahepatic biliary duct dilatation. There is dilatation of the common hepatic and common bile ducts to the level of the ampulla. There is mild enhancement at the level of the ampulla without well-defined mass appreciable. Pancreas: There is pancreatic duct dilatation measuring just under 5 mm at its maximum. No well-defined pancreatic mass is seen by CT. No peripancreatic fluid. Spleen: No splenic lesions are evident. Adrenals/Urinary Tract: Adrenals bilaterally appear normal. There is a 4 mm apparent cyst in the lower pole right kidney. No evident hydronephrosis on either side. Extrarenal pelvis on each side, an anatomic variant. No appreciable renal or ureteral  calculus on either side. Urinary bladder is midline with wall thickness within normal limits. Stomach/Bowel: Moderate stool throughout colon noted. There is wall thickening in the gastric antrum. There is no appreciable small or large bowel wall thickening. No evident bowel obstruction. Terminal ileum appears normal. No evident appendiceal inflammation. No free air or portal venous air. Vascular/Lymphatic: There is aneurysmal dilatation of the distal abdominal aorta extending to the bifurcation with a maximum transverse diameter of 3.4 x 3.3 cm. There is thrombus in the periphery of this aneurysmal dilatation. There are stents in each common iliac artery. Extensive aortic and pelvic arterial atherosclerotic calcification noted. Calcification noted in each proximal renal artery. Major venous structures appear patent. No evident adenopathy in the abdomen or pelvis. Reproductive: Uterus is anteverted.  No adnexal masses evident. Other: No appreciable abscess or ascites in the abdomen or pelvis. Musculoskeletal: No blastic or lytic bone lesions. There are degenerative changes in the lumbar spine. No appreciable abdominal wall or intramuscular lesions. IMPRESSION: 1. Intrahepatic and extrahepatic biliary duct dilatation which is greater than is expected with cholecystectomy state. Mild enhancement is noted at the ampulla. There is also a degree of pancreatic duct dilatation. These findings raise concern for potential neoplastic lesion at or near the ampulla. A well-defined mass is not appreciated by CT. These findings are sufficiently suspicious for potential lesion in this area to warrant correlation with abdominal MR pre and post-contrast as well as MRCP to further evaluate. Gallbladder is absent. 2. Wall thickening in the gastric antrum concerning for gastritis in this area. No other bowel wall thickening. No bowel obstruction. No abscess in the abdomen or pelvis. Appendix region appears unremarkable. 3. 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 Recommend follow-up ultrasound every 3 years. This recommendation follows ACR consensus guidelines: White Paper of the ACR Incidental Findings Committee II on Vascular Findings. J Am Coll Radiol 2013; 10:789-794. thrombus noted in the periphery of this aneurysm. There are stents in each iliac artery. There is aortic and multifocal pelvic arterial vascular calcification. 4. No renal or ureteral calculus. No hydronephrosis. Urinary bladder wall thickness normal. Electronically Signed   By: Lowella Grip III M.D.   On: 08/04/2020 13:04   MR 3D Recon At Scanner  Result Date: 08/04/2020 CLINICAL DATA:  34 female with intra and extrahepatic biliary duct dilatation. Postcholecystectomy. EXAM: MRI ABDOMEN WITHOUT AND WITH CONTRAST (INCLUDING MRCP) TECHNIQUE: Multiplanar multisequence MR imaging of the abdomen was performed both before and after the administration of intravenous contrast. Heavily T2-weighted images of the biliary and pancreatic ducts were obtained, and three-dimensional MRCP images were rendered by post processing. CONTRAST:  84mL GADAVIST GADOBUTROL 1 MMOL/ML IV SOLN COMPARISON:  CT 08/04/2020, 10/19/2015 FINDINGS: Lower chest:  Lung bases clear Hepatobiliary: Marked intra and extrahepatic biliary duct dilatation. Extrahepatic bile ducts measure up to 13 mm. Common bile duct measures up to 13 mm. No filling defect within the common bile duct. There is abrupt narrowing of the distal common bile duct with near complete loss of the lumen over a 7 mm segment (image 10/MRCP MIP sequence). The most distal common bile duct reconstitutes to normal caliber (3 mm) over a 26 mm segment leading up to the ampulla. This stricturing is seen on coronal postcontrast T1 weighted imaging (image 40/25). There is enhancing a lesion the RIGHT hepatic lobe (image 14/9) appears to have a vascular pattern. Lesion is subtly evident on more delayed  imaging (series 21). Lesion fades to isointense on the  90 second imaging. No additional enhancing hepatic lesions. Pancreas: Within the lateral aspect of the pancreatic head, there is a subtle hypointensity on noncontrast T1 weighted imaging measuring 10 mm x 10 mm on image 41/series 18. There is mild pancreatic duct dilatation proximal to this lesion on same image. The duct is lost through this region. On the delayed vascular imaging this same region is more difficult to define. There subtle ductal interruption in the same vicinity of the stricturing of the common bile duct. (Image 43/24 ) No periportal or peripancreatic lymphadenopathy. Spleen: Normal spleen. Adrenals/urinary tract: Adrenal glands and kidneys are normal. Stomach/Bowel: Stomach and limited of the small bowel is unremarkable Vascular/Lymphatic: Abdominal aortic normal caliber. No retroperitoneal periportal lymphadenopathy. Musculoskeletal: No aggressive osseous lesion IMPRESSION: 1. 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 Differential would include benign and malignant stricturing of the distal common bile duct versus is external mass lesion constricting the duct. Benign stricturing would include inflammation versus cholangiocarcinoma. External compression differential would include pancreatic adenocarcinoma. 2. Subtle lesion in the head of the pancreas. Subtle interruption of the pancreatic duct through the head of the pancreas. These findings favor a pancreatic neoplasm (adenocarcinoma) as source of ductal obstruction. 3. Enhancing lesion the RIGHT hepatic lobe is favored benign vascular lesion. 4. No lymphadenopathy identified. 5. Recommend endoscopic ultrasound for evaluation of the pancreatic head / distal common bile duct. Electronically Signed   By: Suzy Bouchard M.D.   On: 08/04/2020 15:09   DG C-Arm 1-60 Min-No Report  Result Date: 08/05/2020 Fluoroscopy was utilized by  the requesting physician.  No radiographic interpretation.   MR ABDOMEN MRCP W WO CONTAST  Result Date: 08/04/2020 CLINICAL DATA:  56 female with intra and extrahepatic biliary duct dilatation. Postcholecystectomy. EXAM: MRI ABDOMEN WITHOUT AND WITH CONTRAST (INCLUDING MRCP) TECHNIQUE: Multiplanar multisequence MR imaging of the abdomen was performed both before and after the administration of intravenous contrast. Heavily T2-weighted images of the biliary and pancreatic ducts were obtained, and three-dimensional MRCP images were rendered by post processing. CONTRAST:  72mL GADAVIST GADOBUTROL 1 MMOL/ML IV SOLN COMPARISON:  CT 08/04/2020, 10/19/2015 FINDINGS: Lower chest:  Lung bases clear Hepatobiliary: Marked intra and extrahepatic biliary duct dilatation. Extrahepatic bile ducts measure up to 13 mm. Common bile duct measures up to 13 mm. No filling defect within the common bile duct. There is abrupt narrowing of the distal common bile duct with near complete loss of the lumen over a 7 mm segment (image 10/MRCP MIP sequence). The most distal common bile duct reconstitutes to normal caliber (3 mm) over a 26 mm segment leading up to the ampulla. This stricturing is seen on coronal postcontrast T1 weighted imaging (image 40/25). There is enhancing a lesion the RIGHT hepatic lobe (image 14/9) appears to have a vascular pattern. Lesion is subtly evident on more delayed imaging (series 21). Lesion fades to isointense on the 90 second imaging. No additional enhancing hepatic lesions. Pancreas: Within the lateral aspect of the pancreatic head, there is a subtle hypointensity on noncontrast T1 weighted imaging measuring 10 mm x 10 mm on image 41/series 18. There is mild pancreatic duct dilatation proximal to this lesion on same image. The duct is lost through this region. On the delayed vascular imaging this same region is more difficult to define. There subtle ductal interruption in the same vicinity of the  stricturing of the common bile duct. (Image 43/24 ) No periportal or peripancreatic lymphadenopathy. Spleen: Normal spleen.  Adrenals/urinary tract: Adrenal glands and kidneys are normal. Stomach/Bowel: Stomach and limited of the small bowel is unremarkable Vascular/Lymphatic: Abdominal aortic normal caliber. No retroperitoneal periportal lymphadenopathy. Musculoskeletal: No aggressive osseous lesion IMPRESSION: 1. 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 Differential would include benign and malignant stricturing of the distal common bile duct versus is external mass lesion constricting the duct. Benign stricturing would include inflammation versus cholangiocarcinoma. External compression differential would include pancreatic adenocarcinoma. 2. Subtle lesion in the head of the pancreas. Subtle interruption of the pancreatic duct through the head of the pancreas. These findings favor a pancreatic neoplasm (adenocarcinoma) as source of ductal obstruction. 3. Enhancing lesion the RIGHT hepatic lobe is favored benign vascular lesion. 4. No lymphadenopathy identified. 5. Recommend endoscopic ultrasound for evaluation of the pancreatic head / distal common bile duct. Electronically Signed   By: Suzy Bouchard M.D.   On: 08/04/2020 15:09    Pancreatic mass #70 year old female patient history of COPD-is currently admitted to hospital for worsening abdominal pain/obstructive jaundice; imaging concerning for pancreatic mass  #Pancreatic head/uncinate mass-highly concerning for malignancy  #Obstructive jaundice secondary to pancreatic mass-awaiting ERCP/stenting  #COPD  Recommendations: #Recommend checking CA 19-9 await ERCP/stenting.  If brushings for cytology inconclusive; will recommend EUS outpatient staging/diagnostic purposes.  In the absence of any distant site disease patient would be candidate for surgery.   Thank you Dr.Patel for allowing  me to participate in the care of your pleasant patient. Please do not hesitate to contact me with questions or concerns in the interim.  Discussed with Dr. Posey Pronto.  All questions were answered. The patient knows to call the clinic with any problems, questions or concerns.    Shirley Sickle, MD 08/05/2020 8:00 PM

## 2020-08-05 NOTE — Op Note (Signed)
[facility] Patient Name: Kai Railsback Procedure Date: 08/05/2020 11:54 AM MRN: [id number] Account #: [id number] Date of Birth: 10-04-1950 Admit Type: Inpatient Age: 70 Room: [facility] ENDO ROOM 4 Gender: Female Note Status: Finalized Procedure:             ERCP Indications:           Tumor of the head of pancreas Providers:             Lucilla Lame MD, MD Referring MD:          Casilda Carls, MD (Referring MD) Medicines:             General Anesthesia Complications:         No immediate complications. Procedure:             Pre-Anesthesia Assessment:                        - Prior to the procedure, a History and Physical was                         performed, and patient medications and allergies were                         reviewed. The patient's tolerance of previous                         anesthesia was also reviewed. The risks and benefits                         of the procedure and the sedation options and risks                         were discussed with the patient. All questions were                         answered, and informed consent was obtained. Prior                         Anticoagulants: The patient has taken no previous                         anticoagulant or antiplatelet agents. ASA Grade                         Assessment: II - A patient with mild systemic disease.                         After reviewing the risks and benefits, the patient                         was deemed in satisfactory condition to undergo the                         procedure.                        After obtaining informed consent, the scope was passed  under direct vision. Throughout the procedure, the                         patient's blood pressure, pulse, and oxygen                         saturations were monitored continuously. The [employer] D single use duodenoscope was                          introduced through the mouth, and used to inject                         contrast into and used to inject contrast into the                         bile duct. The ERCP was accomplished without                         difficulty. The patient tolerated the procedure well. Findings:      A scout film of the abdomen was obtained. Surgical clips, consistent       with previous cholecystectomy, were seen in the area of the cystic duct.       The major papilla was normal. A wire was passed into the biliary tree.       The bile duct was deeply cannulated with the short-nosed traction       sphincterotome. Contrast was injected. I personally interpreted the bile       duct images. There was brisk flow of contrast through the ducts. Image       quality was excellent. Contrast extended to the entire biliary tree. The       middle third of the main bile duct contained a single segmental       stenosis. The entire biliary tree was dilated. A wire was passed into       the biliary tree. A 5 mm biliary sphincterotomy was made with a traction       (standard) sphincterotome using ERBE electrocautery. There was no       post-sphincterotomy bleeding. One 10 Fr by 9 cm plastic stent with a       single external flap and a single internal flap was placed 7 cm into the       common bile duct. Bile flowed through the stent. The stent was in good       position. Impression:            - The major papilla appeared normal.                        - A single segmental biliary stricture was found in                         the middle third of the main bile duct.                        - The entire biliary tree was dilated.                        -  A biliary sphincterotomy was performed.                        - One plastic stent was placed into the common bile                         duct. Recommendation:        - Return patient to hospital ward for ongoing care.                        - Clear liquid  diet.                        - Continue present medications.                        - Repeat ERCP in 3 months to exchange stent. Procedure Code(s):     --- Professional ---                        [phone], Endoscopic retrograde cholangiopancreatography                         (ERCP); with placement of endoscopic stent into                         biliary or pancreatic duct, including pre- and                         post-dilation and guide wire passage, when performed,                         including sphincterotomy, when performed, each stent                        [id number], Endoscopic catheterization of the biliary                         ductal system, radiological supervision and                         interpretation Diagnosis Code(s):     --- Professional ---                        D49.0, Neoplasm of unspecified behavior of digestive                         system                        K83.1, Obstruction of bile duct CPT copyright 2019 American Medical Association. All rights reserved. The codes documented in this report are preliminary and upon coder review may  be revised to meet current compliance requirements. Lucilla Lame MD, MD 08/05/2020 1:28:51 PM This report has been signed electronically. Number of Addenda: 0 Note Initiated On: 08/05/2020 11:54 AM Estimated Blood Loss:  Estimated blood loss: none.      [facility]

## 2020-08-05 NOTE — Assessment & Plan Note (Addendum)
#  70 year old female patient history of COPD-is currently admitted to hospital for worsening abdominal pain/obstructive jaundice; imaging concerning for pancreatic mass  #Pancreatic head/uncinate mass-highly concerning for malignancy-would recommend endoscopic ultrasound/biopsy on outpatient basis.  Discuss with GI- nurse Navigator, Mathis Fare.   #Obstructive jaundice secondary to pancreatic mass- ERCP/stenting-LFTs improving.  Stable.  #Abdominal pain-worsening question pancreatitis postprocedural.  Improved.  #COPD  #Reiterated with the patient the plan for outpatient upper endoscopy/EUS; biopsy.  Patient will be contacted for above appointments; will be discussed in tumor conference in my absence. Will also need CT chest; and referral to Dr.Kevvin Manuella Ghazi re: upfront surgical options.

## 2020-08-05 NOTE — Progress Notes (Addendum)
[facility] [place] NAME: Shirley Barton    MR#:  [id number]  DATE OF BIRTH:  30-Oct-1950  SUBJECTIVE:  patient presented with nausea vomiting weight loss and poor appetite for last several weeks. She is had on and off right upper quadrant abdominal pain since her gallbladder was removed few years ago. Denies any fever. She is NPO for ERCP.  No family in the room t REVIEW OF SYSTEMS:   Review of Systems  Constitutional: Positive for malaise/fatigue and weight loss. Negative for chills and fever.  HENT: Negative for ear discharge, ear pain and nosebleeds.   Eyes: Negative for blurred vision, pain and discharge.  Respiratory: Negative for sputum production, shortness of breath, wheezing and stridor.   Cardiovascular: Negative for chest pain, palpitations, orthopnea and PND.  Gastrointestinal: Positive for abdominal pain, nausea and vomiting. Negative for diarrhea.  Genitourinary: Negative for frequency and urgency.  Musculoskeletal: Negative for back pain and joint pain.  Neurological: Positive for weakness. Negative for sensory change, speech change and focal weakness.  Psychiatric/Behavioral: Negative for depression and hallucinations. The patient is not nervous/anxious.    Tolerating Diet:npo Tolerating PT:   DRUG ALLERGIES:   Allergies  Allergen Reactions  . Tramadol   . Cortisone Rash    Pt states that she has been getting cortisone shots    . Cortizone-10 [Hydrocortisone] Rash  . Penicillins Rash    Pt states that she has been taking this medication and has had not problem    VITALS:  Blood pressure 122/77, pulse 72, temperature 98.4 F (36.9 C), temperature source Oral, resp. rate 16, height 5\' 4"  (1.626 m), weight 45.4 kg, last menstrual period 01/03/1995, SpO2 96 %.  PHYSICAL EXAMINATION:   Physical Exam  GENERAL:  70 y.o.-year-old patient lying in the bed with no acute distress.  HEENT: Head atraumatic, normocephalic.  Icterus+ LUNGS: Normal breath sounds bilaterally, no wheezing, rales, rhonchi. No use of accessory muscles of respiration.  CARDIOVASCULAR: S1, S2 normal. No murmurs, rubs, or gallops.  ABDOMEN: Soft, nontender, nondistended. Bowel sounds present. No organomegaly or mass.  EXTREMITIES: No cyanosis, clubbing or edema b/l.    NEUROLOGIC: Cranial nerves II through XII are intact. No focal Motor or sensory deficits b/l.   PSYCHIATRIC:  patient is alert and oriented x 3.  SKIN: No obvious rash, lesion, or ulcer.   LABORATORY PANEL:  CBC Recent Labs  Lab 08/04/20 0948  WBC 6.7  HGB 15.2*  HCT 46.8*  PLT 228    Chemistries  Recent Labs  Lab 08/04/20 0948  NA 138  K 3.8  CL 100  CO2 26  GLUCOSE 104*  BUN <5*  CREATININE 0.52  CALCIUM 9.9  AST 155*  ALT 398*  ALKPHOS 205*  BILITOT 5.6*   Cardiac Enzymes No results for input(s): TROPONINI in the last 168 hours. RADIOLOGY:  CT ABDOMEN PELVIS W CONTRAST  Result Date: 08/04/2020 CLINICAL DATA:  Abdominal pain with nausea and vomiting EXAM: CT ABDOMEN AND PELVIS WITH CONTRAST TECHNIQUE: Multidetector CT imaging of the abdomen and pelvis was performed using the standard protocol following bolus administration of intravenous contrast. CONTRAST:  28mL OMNIPAQUE IOHEXOL 300 MG/ML  SOLN COMPARISON:  April 06, 2017 FINDINGS: Lower chest: No lung base edema or consolidation evident. Suggestion of bibasilar underlying emphysematous change. Hepatobiliary: No focal liver lesions are evident. Gallbladder is absent. There is generalized intrahepatic biliary duct dilatation. There is dilatation of the common hepatic and common bile ducts to  the level of the ampulla. There is mild enhancement at the level of the ampulla without well-defined mass appreciable. Pancreas: There is pancreatic duct dilatation measuring just under 5 mm at its maximum. No well-defined pancreatic mass is seen by CT. No peripancreatic fluid. Spleen: No splenic lesions are  evident. Adrenals/Urinary Tract: Adrenals bilaterally appear normal. There is a 4 mm apparent cyst in the lower pole right kidney. No evident hydronephrosis on either side. Extrarenal pelvis on each side, an anatomic variant. No appreciable renal or ureteral calculus on either side. Urinary bladder is midline with wall thickness within normal limits. Stomach/Bowel: Moderate stool throughout colon noted. There is wall thickening in the gastric antrum. There is no appreciable small or large bowel wall thickening. No evident bowel obstruction. Terminal ileum appears normal. No evident appendiceal inflammation. No free air or portal venous air. Vascular/Lymphatic: There is aneurysmal dilatation of the distal abdominal aorta extending to the bifurcation with a maximum transverse diameter of 3.4 x 3.3 cm. There is thrombus in the periphery of this aneurysmal dilatation. There are stents in each common iliac artery. Extensive aortic and pelvic arterial atherosclerotic calcification noted. Calcification noted in each proximal renal artery. Major venous structures appear patent. No evident adenopathy in the abdomen or pelvis. Reproductive: Uterus is anteverted.  No adnexal masses evident. Other: No appreciable abscess or ascites in the abdomen or pelvis. Musculoskeletal: No blastic or lytic bone lesions. There are degenerative changes in the lumbar spine. No appreciable abdominal wall or intramuscular lesions. IMPRESSION: 1. Intrahepatic and extrahepatic biliary duct dilatation which is greater than is expected with cholecystectomy state. Mild enhancement is noted at the ampulla. There is also a degree of pancreatic duct dilatation. These findings raise concern for potential neoplastic lesion at or near the ampulla. A well-defined mass is not appreciated by CT. These findings are sufficiently suspicious for potential lesion in this area to warrant correlation with abdominal MR pre and post-contrast as well as MRCP to further  evaluate. Gallbladder is absent. 2. Wall thickening in the gastric antrum concerning for gastritis in this area. No other bowel wall thickening. No bowel obstruction. No abscess in the abdomen or pelvis. Appendix region appears unremarkable. 3. Abdominal aortic aneurysm with maximum transverse diameter of 3.4 x 3.3 cm. Recommend follow-up ultrasound every 3 years. This recommendation follows ACR consensus guidelines: White Paper of the ACR Incidental Findings Committee II on Vascular Findings. J Am Coll Radiol 2013; 10:789-794. thrombus noted in the periphery of this aneurysm. There are stents in each iliac artery. There is aortic and multifocal pelvic arterial vascular calcification. 4. No renal or ureteral calculus. No hydronephrosis. Urinary bladder wall thickness normal. Electronically Signed   By: Bretta Bang III M.D.   On: 08/04/2020 13:04   MR 3D Recon At Scanner  Result Date: 08/04/2020 CLINICAL DATA:  75 female with intra and extrahepatic biliary duct dilatation. Postcholecystectomy. EXAM: MRI ABDOMEN WITHOUT AND WITH CONTRAST (INCLUDING MRCP) TECHNIQUE: Multiplanar multisequence MR imaging of the abdomen was performed both before and after the administration of intravenous contrast. Heavily T2-weighted images of the biliary and pancreatic ducts were obtained, and three-dimensional MRCP images were rendered by post processing. CONTRAST:  76mL GADAVIST GADOBUTROL 1 MMOL/ML IV SOLN COMPARISON:  CT 08/04/2020, 10/19/2015 FINDINGS: Lower chest:  Lung bases clear Hepatobiliary: Marked intra and extrahepatic biliary duct dilatation. Extrahepatic bile ducts measure up to 13 mm. Common bile duct measures up to 13 mm. No filling defect within the common bile duct. There is abrupt narrowing of  the distal common bile duct with near complete loss of the lumen over a 7 mm segment (image 10/MRCP MIP sequence). The most distal common bile duct reconstitutes to normal caliber (3 mm) over a 26 mm segment  leading up to the ampulla. This stricturing is seen on coronal postcontrast T1 weighted imaging (image 40/25). There is enhancing a lesion the RIGHT hepatic lobe (image 14/9) appears to have a vascular pattern. Lesion is subtly evident on more delayed imaging (series 21). Lesion fades to isointense on the 90 second imaging. No additional enhancing hepatic lesions. Pancreas: Within the lateral aspect of the pancreatic head, there is a subtle hypointensity on noncontrast T1 weighted imaging measuring 10 mm x 10 mm on image 41/series 18. There is mild pancreatic duct dilatation proximal to this lesion on same image. The duct is lost through this region. On the delayed vascular imaging this same region is more difficult to define. There subtle ductal interruption in the same vicinity of the stricturing of the common bile duct. (Image 43/24 ) No periportal or peripancreatic lymphadenopathy. Spleen: Normal spleen. Adrenals/urinary tract: Adrenal glands and kidneys are normal. Stomach/Bowel: Stomach and limited of the small bowel is unremarkable Vascular/Lymphatic: Abdominal aortic normal caliber. No retroperitoneal periportal lymphadenopathy. Musculoskeletal: No aggressive osseous lesion IMPRESSION: 1. 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 Differential would include benign and malignant stricturing of the distal common bile duct versus is external mass lesion constricting the duct. Benign stricturing would include inflammation versus cholangiocarcinoma. External compression differential would include pancreatic adenocarcinoma. 2. Subtle lesion in the head of the pancreas. Subtle interruption of the pancreatic duct through the head of the pancreas. These findings favor a pancreatic neoplasm (adenocarcinoma) as source of ductal obstruction. 3. Enhancing lesion the RIGHT hepatic lobe is favored benign vascular lesion. 4. No lymphadenopathy identified. 5.  Recommend endoscopic ultrasound for evaluation of the pancreatic head / distal common bile duct. Electronically Signed   By: Suzy Bouchard M.D.   On: 08/04/2020 15:09   MR ABDOMEN MRCP W WO CONTAST  Result Date: 08/04/2020 CLINICAL DATA:  10 female with intra and extrahepatic biliary duct dilatation. Postcholecystectomy. EXAM: MRI ABDOMEN WITHOUT AND WITH CONTRAST (INCLUDING MRCP) TECHNIQUE: Multiplanar multisequence MR imaging of the abdomen was performed both before and after the administration of intravenous contrast. Heavily T2-weighted images of the biliary and pancreatic ducts were obtained, and three-dimensional MRCP images were rendered by post processing. CONTRAST:  2mL GADAVIST GADOBUTROL 1 MMOL/ML IV SOLN COMPARISON:  CT 08/04/2020, 10/19/2015 FINDINGS: Lower chest:  Lung bases clear Hepatobiliary: Marked intra and extrahepatic biliary duct dilatation. Extrahepatic bile ducts measure up to 13 mm. Common bile duct measures up to 13 mm. No filling defect within the common bile duct. There is abrupt narrowing of the distal common bile duct with near complete loss of the lumen over a 7 mm segment (image 10/MRCP MIP sequence). The most distal common bile duct reconstitutes to normal caliber (3 mm) over a 26 mm segment leading up to the ampulla. This stricturing is seen on coronal postcontrast T1 weighted imaging (image 40/25). There is enhancing a lesion the RIGHT hepatic lobe (image 14/9) appears to have a vascular pattern. Lesion is subtly evident on more delayed imaging (series 21). Lesion fades to isointense on the 90 second imaging. No additional enhancing hepatic lesions. Pancreas: Within the lateral aspect of the pancreatic head, there is a subtle hypointensity on noncontrast T1 weighted imaging measuring 10 mm x  10 mm on image 41/series 18. There is mild pancreatic duct dilatation proximal to this lesion on same image. The duct is lost through this region. On the delayed vascular imaging  this same region is more difficult to define. There subtle ductal interruption in the same vicinity of the stricturing of the common bile duct. (Image 43/24 ) No periportal or peripancreatic lymphadenopathy. Spleen: Normal spleen. Adrenals/urinary tract: Adrenal glands and kidneys are normal. Stomach/Bowel: Stomach and limited of the small bowel is unremarkable Vascular/Lymphatic: Abdominal aortic normal caliber. No retroperitoneal periportal lymphadenopathy. Musculoskeletal: No aggressive osseous lesion IMPRESSION: 1. 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 Differential would include benign and malignant stricturing of the distal common bile duct versus is external mass lesion constricting the duct. Benign stricturing would include inflammation versus cholangiocarcinoma. External compression differential would include pancreatic adenocarcinoma. 2. Subtle lesion in the head of the pancreas. Subtle interruption of the pancreatic duct through the head of the pancreas. These findings favor a pancreatic neoplasm (adenocarcinoma) as source of ductal obstruction. 3. Enhancing lesion the RIGHT hepatic lobe is favored benign vascular lesion. 4. No lymphadenopathy identified. 5. Recommend endoscopic ultrasound for evaluation of the pancreatic head / distal common bile duct. Electronically Signed   By: Suzy Bouchard M.D.   On: 08/04/2020 15:09   ASSESSMENT AND PLAN:   Shirley Barton is a 70 y.o. female with medical history significant of hypertension, hyperlipidemia, COPD, GERD, depression, anxiety, skin cancer, PVD, who presents with abdominal pain, nausea, vomiting. Patient states that she has chronic intermittent abdominal pain for years but her abdominal pain has worsened for about 2 weeks, much worse during the past several days.  The abdominal pain is located in the right upper and right middle quadrant, constant, 10 out of 10 severity,  sharp.  Abdominal pain with elevated LFTs and bilirubin abnormal MR CP suggestive of pancreatic head mass with dilated pancreatic and biliary duct -- IV fluids -- patient to get ERCP with stent placement with G.I. dr Allen Norris -- resume diet to clear liquid after procedure. Check LFTs and lipase Enea -- PRN pain meds -- as needed Zofran  COPD -- stable continue bronchodilators as needed  Hypertension -- patient currently not taking any meds. Continue hydralazine PRN  Hyperlipidemia -- patient was on Lipitor however she stopped taking it  Jerrye Bushy --- continue Protonix  Depression on Zoloft  AAA --CT scan--Abdominal aortic aneurysm with maximum transverse diameter of 3.4 x 3.3 cm. Recommend follow-up ultrasound every 3 years -- incidental finding follow-up with PCP  Nutrition Status: Nutrition Problem: Inadequate oral intake Etiology: poor appetite Signs/Symptoms: per patient/family report Interventions: Boost Breeze     Procedures: Family communication :son Consults : G.I., oncology CODE STATUS: full DVT Prophylaxis : SCD/Lovenox Level of care: Med-Surg Status is: Inpatient  Remains inpatient appropriate because:Inpatient level of care appropriate due to severity of illness   Dispo: The patient is from: Home              Anticipated d/c is to: Home              Patient currently is not medically stable to d/c.   Difficult to place patient No        TOTAL TIME TAKING CARE OF THIS PATIENT: 25 minutes.  >50% time spent on counselling and coordination of care  Note: This dictation was prepared with Dragon dictation along with smaller phrase technology. Any transcriptional errors that result from  this process are unintentional.  Fritzi Mandes M.D    [facility]   CC: Primary care physician; Casilda Carls, MDPatient ID: Shirley Barton, female   DOB: 09-Nov-1950, 70 y.o.   MRN: [id number]

## 2020-08-05 NOTE — Plan of Care (Signed)
Continuing with plan of care. 

## 2020-08-05 NOTE — Transfer of Care (Signed)
Immediate Anesthesia Transfer of Care Note  Patient: Shirley Barton  Procedure(s) Performed: ENDOSCOPIC RETROGRADE CHOLANGIOPANCREATOGRAPHY (ERCP) (N/A )  Patient Location: PACU  Anesthesia Type:General  Level of Consciousness: awake and alert   Airway & Oxygen Therapy: Patient Spontanous Breathing and Patient connected to face mask oxygen  Post-op Assessment: Report given to RN and Post -op Vital signs reviewed and stable  Post vital signs: Reviewed and stable  Last Vitals:  Vitals Value Taken Time  BP 115/105 08/05/20 1333  Temp    Pulse 84 08/05/20 1338  Resp    SpO2 100 % 08/05/20 1338  Vitals shown include unvalidated device data.  Last Pain:  Vitals:   08/05/20 1000  TempSrc: Oral  PainSc:       Patients Stated Pain Goal: 0 (19/50/93 2671)  Complications: No complications documented.

## 2020-08-06 DIAGNOSIS — I714 Abdominal aortic aneurysm, without rupture: Secondary | ICD-10-CM | POA: Diagnosis not present

## 2020-08-06 DIAGNOSIS — R1084 Generalized abdominal pain: Secondary | ICD-10-CM | POA: Diagnosis not present

## 2020-08-06 DIAGNOSIS — K8689 Other specified diseases of pancreas: Secondary | ICD-10-CM | POA: Diagnosis not present

## 2020-08-06 DIAGNOSIS — K831 Obstruction of bile duct: Secondary | ICD-10-CM | POA: Diagnosis not present

## 2020-08-06 LAB — GLUCOSE, CAPILLARY: Glucose-Capillary: 97 mg/dL (ref 70–99)

## 2020-08-06 LAB — COMPREHENSIVE METABOLIC PANEL
ALT: 254 U/L — ABNORMAL HIGH (ref 0–44)
AST: 118 U/L — ABNORMAL HIGH (ref 15–41)
Albumin: 3 g/dL — ABNORMAL LOW (ref 3.5–5.0)
Alkaline Phosphatase: 138 U/L — ABNORMAL HIGH (ref 38–126)
Anion gap: 7 (ref 5–15)
BUN: 6 mg/dL — ABNORMAL LOW (ref 8–23)
CO2: 29 mmol/L (ref 22–32)
Calcium: 8.4 mg/dL — ABNORMAL LOW (ref 8.9–10.3)
Chloride: 100 mmol/L (ref 98–111)
Creatinine, Ser: 0.67 mg/dL (ref 0.44–1.00)
GFR, Estimated: >60 mL/min (ref >60)
Glucose, Bld: 105 mg/dL — ABNORMAL HIGH (ref 70–99)
Potassium: 4 mmol/L (ref 3.5–5.1)
Sodium: 136 mmol/L (ref 135–145)
Total Bilirubin: 1.9 mg/dL — ABNORMAL HIGH (ref 0.3–1.2)
Total Protein: 6 g/dL — ABNORMAL LOW (ref 6.5–8.1)

## 2020-08-06 LAB — LIPASE, BLOOD: Lipase: 199 U/L — ABNORMAL HIGH (ref 11–51)

## 2020-08-06 NOTE — Progress Notes (Signed)
GI Inpatient Follow-up Note  Subjective:  Patient seen and with abdominal pain although unclear if different from original abdominal pain. Urine is clearing up and bilirubin downtrending.  Scheduled Inpatient Medications:  . feeding supplement  1 Container Oral TID BM  . mometasone-formoterol  2 puff Inhalation BID  . pantoprazole  40 mg Oral Daily  . sucralfate  1 g Oral TID WC & HS    Continuous Inpatient Infusions:   . lactated ringers Stopped (08/05/20 1028)    PRN Inpatient Medications:  albuterol, dextromethorphan-guaiFENesin, hydrALAZINE, morphine injection, ondansetron (ZOFRAN) IV, oxyCODONE, traZODone  Review of Systems:  Review of Systems  Constitutional: Negative for chills and fever.  Respiratory: Negative for cough.   Cardiovascular: Negative for chest pain.  Gastrointestinal: Positive for abdominal pain. Negative for blood in stool, constipation, diarrhea, melena, nausea and vomiting.  Genitourinary: Negative for dysuria.  Musculoskeletal: Negative for joint pain.  Skin: Negative for itching and rash.  Neurological: Negative for focal weakness.  All other systems reviewed and are negative.    Physical Examination: BP (!) 114/59 (BP Location: Left Arm)   Pulse 75   Temp 98.8 F (37.1 C) (Oral)   Resp 16   Ht 5\' 4"  (1.626 m)   Wt 52.2 kg   LMP 01/03/1995 (Approximate)   SpO2 96%   BMI 19.76 kg/m  Gen: NAD, alert and oriented x 4 HEENT: PEERLA, EOMI, Neck: supple, no JVD or thyromegaly Chest: No respiratory distress CV: RRR Abd: soft, some tenderness Ext: no edema, well perfused with 2+ pulses, Skin: no rash or lesions noted Lymph: no LAD  Data: Lab Results  Component Value Date   WBC 6.7 08/04/2020   HGB 15.2 (H) 08/04/2020   HCT 46.8 (H) 08/04/2020   MCV 92.1 08/04/2020   PLT 228 08/04/2020   Recent Labs  Lab 08/04/20 0948  HGB 15.2*   Lab Results  Component Value Date   NA 136 08/06/2020   K 4.0 08/06/2020   CL 100 08/06/2020    CO2 29 08/06/2020   BUN 6 (L) 08/06/2020   CREATININE 0.67 08/06/2020   Lab Results  Component Value Date   ALT 254 (H) 08/06/2020   AST 118 (H) 08/06/2020   ALKPHOS 138 (H) 08/06/2020   BILITOT 1.9 (H) 08/06/2020   Recent Labs  Lab 08/04/20 1138 08/05/20 0814  APTT  --  45*  INR 0.9  --    Assessment/Plan: Ms. Trapani is a 70 y.o. 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 Note made of elevated lipase but unsure if pain is different than what it was before procedure.  Recommendations:  - continue supportive care with pain control, IV fluids - advance diet to solids - f/u oncology recs, likely needs outpatient EUS  Please call with any questions or concerns.  Raylene Miyamoto MD, MPH [place]

## 2020-08-06 NOTE — Plan of Care (Signed)
Continuing with plan of care. 

## 2020-08-06 NOTE — Progress Notes (Signed)
Shirley Barton   DOB:1951-02-04   SW#:[id number]    Subjective: Patient s/p ERCP/stenting.  Complains of worsening abdominal pain this morning.  Patient had clear liquid diet.  Complains of nausea.  Objective:  Vitals:   08/06/20 1115 08/06/20 1615  BP: (!) 114/59 (!) 140/91  Pulse: 75 82  Resp: 16 16  Temp: 98.8 F (37.1 C) 98.7 F (37.1 C)  SpO2: 96% 98%     Intake/Output Summary (Last 24 hours) at 08/06/2020 1921 Last data filed at 08/06/2020 1800 Gross per 24 hour  Intake 1020 ml  Output 200 ml  Net 820 ml    Physical Exam Constitutional:      Comments: Patient resting in the bed complaining of abdominal pain.  HENT:     Head: Normocephalic and atraumatic.     Mouth/Throat:     Pharynx: No oropharyngeal exudate.  Eyes:     Pupils: Pupils are equal, round, and reactive to light.  Cardiovascular:     Rate and Rhythm: Normal rate and regular rhythm.  Pulmonary:     Effort: No respiratory distress.     Breath sounds: No wheezing.     Comments: Decreased air entry bilaterally.  No wheeze or crackles. Abdominal:     General: Bowel sounds are normal. There is no distension.     Palpations: Abdomen is soft. There is no mass.     Tenderness: There is no abdominal tenderness. There is no guarding or rebound.     Comments: Epigastric tenderness.  No rigidity or guarding.  Musculoskeletal:        General: No tenderness. Normal range of motion.     Cervical back: Normal range of motion and neck supple.  Skin:    General: Skin is warm.  Neurological:     Mental Status: She is alert and oriented to person, place, and time.  Psychiatric:        Mood and Affect: Affect normal.      Labs:  Lab Results  Component Value Date   WBC 6.7 08/04/2020   HGB 15.2 (H) 08/04/2020   HCT 46.8 (H) 08/04/2020   MCV 92.1 08/04/2020   PLT 228 08/04/2020   NEUTROABS 5.9 10/06/2015    Lab Results  Component Value Date   NA 136 08/06/2020   K 4.0 08/06/2020   CL 100 08/06/2020   CO2 29  08/06/2020    Studies:  DG C-Arm 1-60 Min-No Report  Result Date: 08/05/2020 Fluoroscopy was utilized by the requesting physician.  No radiographic interpretation.    Pancreatic mass #70 year old female patient history of COPD-is currently admitted to hospital for worsening abdominal pain/obstructive jaundice; imaging concerning for pancreatic mass  #Pancreatic head/uncinate mass-highly concerning for malignancy-would recommend endoscopic ultrasound/biopsy on outpatient basis.  #Obstructive jaundice secondary to pancreatic mass- ERCP/stenting-LFTs improving.  #Abdominal pain-worsening question pancreatitis postprocedural.  Defer to primary service/GI.  #COPD  #The above plan of care was discussed with Dr. Posey Pronto.  Also spoke with patient's son Mr.Kimrey regarding the above plan.   Cammie Sickle, MD 08/06/2020  7:21 PM

## 2020-08-06 NOTE — Progress Notes (Signed)
[facility] [place] NAME: Shirley Barton    MR#:  [id number]  DATE OF BIRTH:  1950/05/09  SUBJECTIVE:  patient complains of burning epigastric pain after ERCP. Able to tolerate some of the liquid diet.  REVIEW OF SYSTEMS:   Review of Systems  Constitutional: Positive for malaise/fatigue and weight loss. Negative for chills and fever.  HENT: Negative for ear discharge, ear pain and nosebleeds.   Eyes: Negative for blurred vision, pain and discharge.  Respiratory: Negative for sputum production, shortness of breath, wheezing and stridor.   Cardiovascular: Negative for chest pain, palpitations, orthopnea and PND.  Gastrointestinal: Positive for abdominal pain. Negative for diarrhea.  Genitourinary: Negative for frequency and urgency.  Musculoskeletal: Negative for back pain and joint pain.  Neurological: Positive for weakness. Negative for sensory change, speech change and focal weakness.  Psychiatric/Behavioral: Negative for depression and hallucinations. The patient is not nervous/anxious.    Tolerating Diet:npo Tolerating PT:   DRUG ALLERGIES:   Allergies  Allergen Reactions  . Tramadol   . Cortisone Rash    Pt states that she has been getting cortisone shots    . Cortizone-10 [Hydrocortisone] Rash  . Penicillins Rash    Pt states that she has been taking this medication and has had not problem    VITALS:  Blood pressure (!) 114/59, pulse 75, temperature 98.8 F (37.1 C), temperature source Oral, resp. rate 16, height 5\' 4"  (1.626 m), weight 52.2 kg, last menstrual period 01/03/1995, SpO2 96 %.  PHYSICAL EXAMINATION:   Physical Exam  GENERAL:  70 y.o.-year-old patient lying in the bed with no acute distress.  HEENT: Head atraumatic, normocephalic. Icterus+ LUNGS: Normal breath sounds bilaterally, no wheezing, rales, rhonchi. No use of accessory muscles of respiration.  CARDIOVASCULAR: S1, S2 normal. No murmurs, rubs, or  gallops.  ABDOMEN: Soft, nontender, nondistended. Bowel sounds present. No organomegaly or mass.  EXTREMITIES: No cyanosis, clubbing or edema b/l.    NEUROLOGIC: Cranial nerves II through XII are intact. No focal Motor or sensory deficits b/l.   PSYCHIATRIC:  patient is alert and oriented x 3.  SKIN: No obvious rash, lesion, or ulcer.   LABORATORY PANEL:  CBC Recent Labs  Lab 08/04/20 0948  WBC 6.7  HGB 15.2*  HCT 46.8*  PLT 228    Chemistries  Recent Labs  Lab 08/06/20 0457  NA 136  K 4.0  CL 100  CO2 29  GLUCOSE 105*  BUN 6*  CREATININE 0.67  CALCIUM 8.4*  AST 118*  ALT 254*  ALKPHOS 138*  BILITOT 1.9*   Cardiac Enzymes No results for input(s): TROPONINI in the last 168 hours. RADIOLOGY:  DG C-Arm 1-60 Min-No Report  Result Date: 08/05/2020 Fluoroscopy was utilized by the requesting physician.  No radiographic interpretation.   ASSESSMENT AND PLAN:   KADE RICKELS is a 70 y.o. female with medical history significant of hypertension, hyperlipidemia, COPD, GERD, depression, anxiety, skin cancer, PVD, who presents with abdominal pain, nausea, vomiting. Patient states that she has chronic intermittent abdominal pain for years but her abdominal pain has worsened for about 2 weeks, much worse during the past several days.  The abdominal pain is located in the right upper and right middle quadrant, constant, 10 out of 10 severity, sharp.  Abdominal pain with elevated LFTs and bilirubin abnormal MR CP suggestive of pancreatic head mass with dilated pancreatic and biliary duct-- workup in progress -- IV fluids -- 5/6--s/p ERCP with stent  placement with G.I. dr Allen Norris. Recommends EUS tertiary care center -- resume diet to clear liquid after procedure.  -- PRN pain meds -- as needed Zofran --5/7-- burning epigastric pain. Lipase elevated 199. Will continue clear liquid and monitor for pancreatitis post procedure -- seen by Dr. Rogue Bussing for pancreatic mass  COPD --  stable continue bronchodilators as needed  Hypertension -- patient currently not taking any meds. Continue hydralazine PRN  Hyperlipidemia -- patient was on Lipitor however she stopped taking it  Jerrye Bushy --- continue Protonix  Depression on Zoloft  AAA --CT scan--Abdominal aortic aneurysm with maximum transverse diameter of 3.4 x 3.3 cm. Recommend follow-up ultrasound every 3 years -- incidental finding follow-up with PCP  Nutrition Status: Nutrition Problem: Inadequate oral intake Etiology: poor appetite Signs/Symptoms: per patient/family report Interventions: Boost Breeze     Procedures: Family communication :son 5/6 Consults : G.I., oncology CODE STATUS: full DVT Prophylaxis : SCD/Lovenox Level of care: Med-Surg Status is: Inpatient  Remains inpatient appropriate because:Inpatient level of care appropriate due to severity of illness   Dispo: The patient is from: Home              Anticipated d/c is to: Home              Patient currently is not medically stable to d/c.   Difficult to place patient No        TOTAL TIME TAKING CARE OF THIS PATIENT: 25 minutes.  >50% time spent on counselling and coordination of care  Note: This dictation was prepared with Dragon dictation along with smaller phrase technology. Any transcriptional errors that result from this process are unintentional.  Fritzi Mandes M.D    [facility]   CC: Primary care physician; Casilda Carls, MDPatient ID: Shirley Barton, female   DOB: [birth year], 70 y.o.   MRN: [id number]

## 2020-08-06 NOTE — Progress Notes (Signed)
Mobility Specialist - Progress Note   08/06/20 1100  Mobility  Activity Ambulated in hall  Level of Assistance Minimal assist, patient does 75% or more  Assistive Device None  Distance Ambulated (ft) 200 ft  Mobility Response Tolerated well  Mobility performed by Mobility specialist  $Mobility charge 1 Mobility    Pre-mobility: 86 HR, 91% SpO2 Post-mobility: 84 HR, 90% SpO2   Pt ambulated in hallway without AD. ModI for bed mobility and transfers. Dizzy upon standing, resolved with time. Mildly unsteady during ambulation, but no LOB. Denied SOB on RA. Voices abdominal pain as 4/10, reports recently receiving pain medication. Voiced mild SOB once returned to seated position, PLB engaged. Left in bed. Tolerated well   Kathee Delton Mobility Specialist 08/06/20, 11:11 AM

## 2020-08-07 ENCOUNTER — Other Ambulatory Visit: Payer: Self-pay | Admitting: Internal Medicine

## 2020-08-07 DIAGNOSIS — K8689 Other specified diseases of pancreas: Secondary | ICD-10-CM | POA: Diagnosis not present

## 2020-08-07 DIAGNOSIS — K219 Gastro-esophageal reflux disease without esophagitis: Secondary | ICD-10-CM | POA: Diagnosis not present

## 2020-08-07 DIAGNOSIS — C259 Malignant neoplasm of pancreas, unspecified: Secondary | ICD-10-CM

## 2020-08-07 DIAGNOSIS — F32A Depression, unspecified: Secondary | ICD-10-CM | POA: Diagnosis not present

## 2020-08-07 DIAGNOSIS — R1084 Generalized abdominal pain: Secondary | ICD-10-CM | POA: Diagnosis not present

## 2020-08-07 DIAGNOSIS — J449 Chronic obstructive pulmonary disease, unspecified: Secondary | ICD-10-CM | POA: Diagnosis not present

## 2020-08-07 LAB — GLUCOSE, CAPILLARY: Glucose-Capillary: 89 mg/dL (ref 70–99)

## 2020-08-07 LAB — LIPASE, BLOOD: Lipase: 51 U/L (ref 11–51)

## 2020-08-07 LAB — CA 19-9 (SERIAL): CA 19-9: 163 U/mL — ABNORMAL HIGH (ref 0–35)

## 2020-08-07 MED ORDER — NEPHRO-VITE RX 1 MG PO TABS: 1.0000 | ORAL_TABLET | Freq: Every day | ORAL | 0 refills | Status: AC

## 2020-08-07 NOTE — Plan of Care (Signed)
Discharge teaching completed with patient who is in stable condition. 

## 2020-08-07 NOTE — Progress Notes (Signed)
Shirley Barton   DOB:26-Oct-1950   GY#:[id number]    Subjective: Denies any worsening abdominal pain overnight.  Was able to eat clear liquids.  No nausea or vomiting.  Complains of neck pain/using ice pack.  Objective:  Vitals:   08/07/20 0818 08/07/20 1113  BP: (!) 152/82 (!) 149/81  Pulse: 78 73  Resp:  18  Temp:  98 F (36.7 C)  SpO2: 92% 96%     Intake/Output Summary (Last 24 hours) at 08/07/2020 1838 Last data filed at 08/07/2020 1040 Gross per 24 hour  Intake 780 ml  Output 1000 ml  Net -220 ml    Physical Exam HENT:     Head: Normocephalic and atraumatic.     Mouth/Throat:     Pharynx: No oropharyngeal exudate.  Eyes:     Pupils: Pupils are equal, round, and reactive to light.  Cardiovascular:     Rate and Rhythm: Normal rate and regular rhythm.  Pulmonary:     Effort: No respiratory distress.     Breath sounds: No wheezing.     Comments: Decreased air entry bilaterally.  No wheeze or crackles. Abdominal:     General: Bowel sounds are normal. There is no distension.     Palpations: Abdomen is soft. There is no mass.     Tenderness: There is no abdominal tenderness. There is no guarding or rebound.     Comments: Mild abdominal tenderness.  Musculoskeletal:        General: No tenderness. Normal range of motion.     Cervical back: Normal range of motion and neck supple.  Skin:    General: Skin is warm.  Neurological:     Mental Status: She is alert and oriented to person, place, and time.  Psychiatric:        Mood and Affect: Affect normal.      Labs:  Lab Results  Component Value Date   WBC 6.7 08/04/2020   HGB 15.2 (H) 08/04/2020   HCT 46.8 (H) 08/04/2020   MCV 92.1 08/04/2020   PLT 228 08/04/2020   NEUTROABS 5.9 10/06/2015    Lab Results  Component Value Date   NA 136 08/06/2020   K 4.0 08/06/2020   CL 100 08/06/2020   CO2 29 08/06/2020    Studies:  No results found.  Pancreatic mass #70 year old female patient history of COPD-is currently  admitted to hospital for worsening abdominal pain/obstructive jaundice; imaging concerning for pancreatic mass  #Pancreatic head/uncinate mass-highly concerning for malignancy-would recommend endoscopic ultrasound/biopsy on outpatient basis.  Discuss with GI- nurse Navigator, Mathis Fare.   #Obstructive jaundice secondary to pancreatic mass- ERCP/stenting-LFTs improving.  Stable.  #Abdominal pain-worsening question pancreatitis postprocedural.  Improved.  #COPD  #Reiterate with the patient the plan for outpatient upper endoscopy/EUS; biopsy.  Patient will be contacted for above appointments; will be discussed in tumor conference in my absence.     Cammie Sickle, MD 08/07/2020  6:38 PM

## 2020-08-07 NOTE — Plan of Care (Signed)
  Problem: Activity: Goal: Risk for activity intolerance will decrease Outcome: Progressing   Problem: Nutrition: Goal: Adequate nutrition will be maintained Outcome: Progressing   Problem: Pain Managment: Goal: General experience of comfort will improve Outcome: Progressing   

## 2020-08-07 NOTE — Plan of Care (Signed)
Continuing with plan of care. 

## 2020-08-07 NOTE — Progress Notes (Signed)
Shirley Barton-this patient will need EUS re: pancreatic mass/elevated CA 19 9.  Please discuss at the tumor conference.  Also make a referral to Dr.Kevin Manuella Ghazi at [place]. I will speak to Dr.Shah; and then power share the images.  Pt will need follow up with X-MD in my absence re: next plan of care/order CT scan chest.  GB

## 2020-08-07 NOTE — Discharge Summary (Signed)
[facility] NAME: Shirley Barton    MR#:  [id number]  DATE OF BIRTH:  06-28-1950  DATE OF ADMISSION:  08/04/2020 ADMITTING PHYSICIAN: Ivor Costa, MD  DATE OF DISCHARGE: 08/07/2020  PRIMARY CARE PHYSICIAN: Casilda Carls, MD    ADMISSION DIAGNOSIS:  Bilirubinemia [E80.6] Cholestasis [K83.1] Transaminitis [R74.01] Pancreatic mass [K86.89] Abdominal pain [R10.9] Abdominal aortic aneurysm (AAA) without rupture (HCC) [I71.4]  DISCHARGE DIAGNOSIS:  Hyperbilirubinemia/abdominal pain suspected due to Pancreatic head/Uncinate mass--further w/u as out pt Obstructive Jaundice secondary to pancreatic mass status post ERCP with stenting COPD  SECONDARY DIAGNOSIS:   Past Medical History:  Diagnosis Date  . Abdominal pain, generalized   . Anxiety   . Atypical chest pain   . Back pain   . Basal cell carcinoma of skin 2013   resected from Left scalp area.   Marland Kitchen COPD (chronic obstructive pulmonary disease) ([place])   . Depression   . DOE (dyspnea on exertion)   . GERD (gastroesophageal reflux disease)   . Hypertension   . Shortness of breath dyspnea     HOSPITAL COURSE:   Shirley Barton a 70 y.o.femalewith medical history significant ofhypertension, hyperlipidemia, COPD, GERD, depression, anxiety, skin cancer, PVD, who presents with abdominal pain, nausea, vomiting. Patient states that she has chronic intermittent abdominal pain for years but her abdominal pain has worsened for about 2 weeks,much worse during the past several days. The abdominal pain is located in the right upper and right middle quadrant, constant, 10 out of 10 severity, sharp.  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 dr Allen Norris. Recommends EUS tertiary care center -- resume diet to clear liquid after  procedure.  -- PRN pain meds -- as needed Zofran --5/7-- burning epigastric pain. Lipase elevated 199. Will continue clear liquid and monitor for pancreatitis post procedure -- seen by Dr. Rogue Bussing for pancreatic mass --5/8--overall improving Lipase 51.   COPD -- stable continue bronchodilators as needed  Hypertension -- patient currently not taking any meds. Continue hydralazine PRN  Hyperlipidemia -- patient was on Lipitor however she stopped taking it  Jerrye Bushy --- continue Protonix  Depression on Zoloft  AAA --CT scan--Abdominal aortic aneurysm with maximum transverse diameter of 3.4 x 3.3 cm. Recommend follow-up ultrasound every 3 years -- incidental finding follow-up with PCP  Nutrition Status: Nutrition Problem: Inadequate oral intake Etiology: poor appetite Signs/Symptoms: per patient/family report Interventions: Boost Breeze  Overall improving. Patient will discharged home and follow-up with cancer center closely.   Procedures: Family communication :son 5/6 and Dr Rogue Bussing also has discussed the f/u plan with son and pt Consults : G.I., oncology CODE STATUS: full DVT Prophylaxis : SCD/Lovenox Level of care: Med-Surg Status is: Inpatient   Dispo: The patient is from: Home  Anticipated d/c is to: Home  Patient currently is medically stable to d/c.              Difficult to place patient No    CONSULTS OBTAINED:  Treatment Team:  Virgel Manifold, MD  DRUG ALLERGIES:   Allergies  Allergen Reactions  . Tramadol   . Cortisone Rash    Pt states that she has been getting cortisone shots    . Cortizone-10 [Hydrocortisone] Rash  . Penicillins Rash    Pt states that she has been taking this medication and has  had not problem    DISCHARGE MEDICATIONS:   Allergies as of 08/07/2020      Reactions   Tramadol    Cortisone Rash   Pt states that she has been getting cortisone shots   Cortizone-10  [hydrocortisone] Rash   Penicillins Rash   Pt states that she has been taking this medication and has had not problem      Medication List    STOP taking these medications   cyanocobalamin 2000 MCG tablet   cyclobenzaprine 5 MG tablet Commonly known as: FLEXERIL   dicyclomine 10 MG capsule Commonly known as: BENTYL   ipratropium 0.03 % nasal spray Commonly known as: ATROVENT   ketorolac 10 MG tablet Commonly known as: TORADOL   metoCLOPramide 5 MG tablet Commonly known as: REGLAN   mometasone 0.1 % lotion Commonly known as: ELOCON   promethazine 12.5 MG tablet Commonly known as: PHENERGAN   sertraline 50 MG tablet Commonly known as: Zoloft   sucralfate 1 g tablet Commonly known as: CARAFATE   traZODone 50 MG tablet Commonly known as: DESYREL   Vitamin D (Ergocalciferol) 1.25 MG (50000 UNIT) Caps capsule Commonly known as: DRISDOL     TAKE these medications   acetaminophen 325 MG tablet Commonly known as: TYLENOL Take 650 mg by mouth daily.   B complex-vitamin C-folic acid 1 MG tablet Take 1 tablet by mouth daily with breakfast.   cetirizine 10 MG tablet Commonly known as: ZYRTEC Take 10 mg by mouth daily.   Fluticasone-Salmeterol 100-50 MCG/DOSE Aepb Commonly known as: ADVAIR Inhale 1 puff into the lungs 2 (two) times daily.   ondansetron 4 MG disintegrating tablet Commonly known as: Zofran ODT Take 1 tablet (4 mg total) by mouth every 6 (six) hours as needed for nausea or vomiting.   pantoprazole 40 MG tablet Commonly known as: PROTONIX Take by mouth.       If you experience worsening of your admission symptoms, develop shortness of breath, life threatening emergency, suicidal or homicidal thoughts you must seek medical attention immediately by calling 911 or calling your MD immediately  if symptoms less severe.  You Must read complete instructions/literature along with all the possible adverse reactions/side effects for all the Medicines you  take and that have been prescribed to you. Take any new Medicines after you have completely understood and accept all the possible adverse reactions/side effects.   Please note  You were cared for by a hospitalist during your hospital stay. If you have any questions about your discharge medications or the care you received while you were in the hospital after you are discharged, you can call the unit and asked to speak with the hospitalist on call if the hospitalist that took care of you is not available. Once you are discharged, your primary care physician will handle any further medical issues. Please note that NO REFILLS for any discharge medications will be authorized once you are discharged, as it is imperative that you return to your primary care physician (or establish a relationship with a primary care physician if you do not have one) for your aftercare needs so that they can reassess your need for medications and monitor your lab values. Today   SUBJECTIVE   My abdominal pain is easing off.can I please go home?Marland Kitchen No new complaints.  VITAL SIGNS:  Blood pressure (!) 152/82, pulse 78, temperature 98.4 F (36.9 C), temperature source Oral, resp. rate 16, height 5\' 4"  (1.626 m), weight 51.1 kg, last menstrual period  01/03/1995, SpO2 92 %.  I/O:    Intake/Output Summary (Last 24 hours) at 08/07/2020 1001 Last data filed at 08/07/2020 0408 Gross per 24 hour  Intake 600 ml  Output 1000 ml  Net -400 ml    PHYSICAL EXAMINATION:  GENERAL:  70 y.o.-year-old patient lying in the bed with no acute distress.  LUNGS: Normal breath sounds bilaterally, no wheezing, rales,rhonchi or crepitation. No use of accessory muscles of respiration.  CARDIOVASCULAR: S1, S2 normal. No murmurs, rubs, or gallops.  ABDOMEN: Soft, non-tender, non-distended. Bowel sounds present. No organomegaly or mass.  EXTREMITIES: No pedal edema, cyanosis, or clubbing.  NEUROLOGIC: Cranial nerves II through XII are intact.  Muscle strength 5/5 in all extremities. Sensation intact. Gait not checked.  PSYCHIATRIC: The patient is alert and oriented x 3.  SKIN: No obvious rash, lesion, or ulcer.   DATA REVIEW:   CBC  Recent Labs  Lab 08/04/20 0948  WBC 6.7  HGB 15.2*  HCT 46.8*  PLT 228    Chemistries  Recent Labs  Lab 08/06/20 0457  NA 136  K 4.0  CL 100  CO2 29  GLUCOSE 105*  BUN 6*  CREATININE 0.67  CALCIUM 8.4*  AST 118*  ALT 254*  ALKPHOS 138*  BILITOT 1.9*    Microbiology Results   Recent Results (from the past 240 hour(s))  Resp Panel by RT-PCR (Flu A&B, Covid) Nasopharyngeal Swab     Status: None   Collection Time: 08/04/20  1:20 PM   Specimen: Nasopharyngeal Swab; Nasopharyngeal(NP) swabs in vial transport medium  Result Value Ref Range Status   SARS Coronavirus 2 by RT PCR NEGATIVE NEGATIVE Final    Comment: (NOTE) SARS-CoV-2 target nucleic acids are NOT DETECTED.  The SARS-CoV-2 RNA is generally detectable in upper respiratory specimens during the acute phase of infection. The lowest concentration of SARS-CoV-2 viral copies this assay can detect is 138 copies/mL. A negative result does not preclude SARS-Cov-2 infection and should not be used as the sole basis for treatment or other patient management decisions. A negative result may occur with  improper specimen collection/handling, submission of specimen other than nasopharyngeal swab, presence of viral mutation(s) within the areas targeted by this assay, and inadequate number of viral copies(<138 copies/mL). A negative result must be combined with clinical observations, patient history, and epidemiological information. The expected result is Negative.  Fact Sheet for Patients:  EntrepreneurPulse.com.au  Fact Sheet for Healthcare Providers:  IncredibleEmployment.be  This test is no t yet approved or cleared by the [place] FDA and  has been authorized for detection and/or  diagnosis of SARS-CoV-2 by FDA under an Emergency Use Authorization (EUA). This EUA will remain  in effect (meaning this test can be used) for the duration of the COVID-19 declaration under Section 564(b)(1) of the Act, 21 U.S.C.section 360bbb-3(b)(1), unless the authorization is terminated  or revoked sooner.       Influenza A by PCR NEGATIVE NEGATIVE Final   Influenza B by PCR NEGATIVE NEGATIVE Final    Comment: (NOTE) The Xpert Xpress SARS-CoV-2/FLU/RSV plus assay is intended as an aid in the diagnosis of influenza from Nasopharyngeal swab specimens and should not be used as a sole basis for treatment. Nasal washings and aspirates are unacceptable for Xpert Xpress SARS-CoV-2/FLU/RSV testing.  Fact Sheet for Patients: EntrepreneurPulse.com.au  Fact Sheet for Healthcare Providers: IncredibleEmployment.be  This test is not yet approved or cleared by the [place] FDA and has been authorized for detection and/or diagnosis of SARS-CoV-2 by  FDA under an Emergency Use Authorization (EUA). This EUA will remain in effect (meaning this test can be used) for the duration of the COVID-19 declaration under Section 564(b)(1) of the Act, 21 U.S.C. section 360bbb-3(b)(1), unless the authorization is terminated or revoked.  Performed at [facility], [address], [place], [place] [address]     RADIOLOGY:  DG C-Arm 1-60 Min-No Report  Result Date: 08/05/2020 Fluoroscopy was utilized by the requesting physician.  No radiographic interpretation.     CODE STATUS:     Code Status Orders  (From admission, onward)         Start     Ordered   08/05/20 0757  Full code  Continuous        08/05/20 0756        Code Status History    Date Active Date Inactive Code Status Order ID Comments User Context   12/27/2015 1312 12/27/2015 1849 Full Code [id number]  Schnier, Dolores Lory, MD Inpatient   Advance Care Planning Activity        TOTAL TIME TAKING CARE OF THIS PATIENT: **40* minutes.    Fritzi Mandes M.D  [facility]    CC: Primary care physician; Casilda Carls, MD

## 2020-08-08 ENCOUNTER — Telehealth: Payer: Self-pay

## 2020-08-08 ENCOUNTER — Encounter: Payer: Self-pay | Admitting: Gastroenterology

## 2020-08-08 ENCOUNTER — Other Ambulatory Visit: Payer: Self-pay

## 2020-08-08 DIAGNOSIS — K8689 Other specified diseases of pancreas: Secondary | ICD-10-CM

## 2020-08-08 NOTE — Progress Notes (Signed)
Call placed to Shirley Barton to arrange EUS. She was encouraged to have EUS performed at [facility] or [facility] due to urgency for biopsy. She has two sons. One of them has COVID and the other lives in [place]. She is requesting to have EUS performed in [place] at the next available opening (08/18/2020). I will make Dr. Rogue Bussing aware. Once EUS is arranged we will arrange follow up.

## 2020-08-08 NOTE — Addendum Note (Signed)
Addended by: Gloris Ham on: 08/08/2020 01:39 PM   Modules accepted: Orders

## 2020-08-08 NOTE — Progress Notes (Signed)
Shirley Barton - pls arrange for the ct scan and follow-up with covering provider.

## 2020-08-08 NOTE — Progress Notes (Signed)
C- please schedule appt with me the around May 25-26th.-MD- labs- cbc/cmp/ca-19-9 Schedule CT chest prior.  Thanks, GB

## 2020-08-08 NOTE — Telephone Encounter (Addendum)
EUS has been arranged for 08/18/2020. Called and educated Ms. Novack further on EUS. Offered again to have EUS performed sooner at another facility. She again states she prefers her son, who currently has covid, to recover and take her. Encouraged that if anything changes we can make these arrangements. Went over instructions for EUS and copy sent to her MyChart and home mailing address. All questions answered. Provided my contact information for future questions or needs. She was also made aware that a follow up appointment is being arranged for results with Dr. Rogue Bussing as well as a chest CT to complete staging. She will be contacted with these once arranged.  Referral was sent to Dr. Manuella Ghazi at the request of Dr. Rogue Bussing.

## 2020-08-09 ENCOUNTER — Telehealth: Payer: Self-pay

## 2020-08-09 ENCOUNTER — Telehealth: Payer: Self-pay | Admitting: Gastroenterology

## 2020-08-09 ENCOUNTER — Other Ambulatory Visit: Payer: Self-pay

## 2020-08-09 DIAGNOSIS — G8918 Other acute postprocedural pain: Secondary | ICD-10-CM

## 2020-08-09 NOTE — Telephone Encounter (Signed)
Patient called, asks for call back.  She states that she is hurting "really bad" in her stomach following her procedure on 08/05/20.  Please call asap

## 2020-08-09 NOTE — Telephone Encounter (Signed)
Radiology request made to have 08/04/2020 CT AP and MR shared with [facility].

## 2020-08-09 NOTE — Telephone Encounter (Signed)
Check a CBC since she is having a temp of 99.

## 2020-08-10 NOTE — Telephone Encounter (Signed)
Pt came to the office today for a CBC check.

## 2020-08-11 ENCOUNTER — Other Ambulatory Visit: Payer: Medicare (Managed Care)

## 2020-08-11 ENCOUNTER — Telehealth: Payer: Self-pay

## 2020-08-11 LAB — CBC WITH DIFFERENTIAL/PLATELET
Basophils Absolute: 0.1 10*3/uL (ref 0.0–0.2)
Basos: 1 %
EOS (ABSOLUTE): 0.2 10*3/uL (ref 0.0–0.4)
Eos: 2 %
Hematocrit: 42.1 % (ref 34.0–46.6)
Hemoglobin: 14 g/dL (ref 11.1–15.9)
Immature Grans (Abs): 0 10*3/uL (ref 0.0–0.1)
Immature Granulocytes: 0 %
Lymphocytes Absolute: 1.6 10*3/uL (ref 0.7–3.1)
Lymphs: 22 %
MCH: 30.3 pg (ref 26.6–33.0)
MCHC: 33.3 g/dL (ref 31.5–35.7)
MCV: 91 fL (ref 79–97)
Monocytes Absolute: 0.6 10*3/uL (ref 0.1–0.9)
Monocytes: 8 %
Neutrophils Absolute: 4.9 10*3/uL (ref 1.4–7.0)
Neutrophils: 67 %
Platelets: 256 10*3/uL (ref 150–450)
RBC: 4.62 x10E6/uL (ref 3.77–5.28)
RDW: 12.5 % (ref 11.7–15.4)
WBC: 7.3 10*3/uL (ref 3.4–10.8)

## 2020-08-11 NOTE — Telephone Encounter (Signed)
-----   Message from Lucilla Lame, MD sent at 08/11/2020  7:37 AM EDT ----- Let the patient know that thre white cell count was normal for any sign of infection. If she would like me to put in a smaller stent if she is having pain, then I will.

## 2020-08-11 NOTE — Telephone Encounter (Signed)
CT scan scheduled for 5/25 at 0800 at the [facility]. Called and went over appointment details/instrucitons with Ms. Oviatt. Also reviewed follow up with Dr. Rogue Bussing on 5/25 at 1445. Read back performed.

## 2020-08-11 NOTE — Progress Notes (Signed)
Tumor Board Documentation  Shirley Barton was presented by Verlon Au, RN at our Tumor Board on 08/11/2020, which included representatives from medical oncology,radiation oncology,internal medicine,navigation,pathology,radiology,surgical,research,palliative care,pulmonology.  Shirley Barton currently presents as a new patient,for discussion with history of the following treatments: active survellience.  Additionally, we reviewed previous medical and familial history, history of present illness, and recent lab results along with all available histopathologic and imaging studies. The tumor board considered available treatment options and made the following recommendations: Additional screening (EUS, CT Chest) Surgical Referral  The following procedures/referrals were also placed: No orders of the defined types were placed in this encounter.   Clinical Trial Status: await additional information   Staging used: To be determined  National site-specific guidelines   were discussed with respect to the case.  Tumor board is a meeting of clinicians from various specialty areas who evaluate and discuss patients for whom a multidisciplinary approach is being considered. Final determinations in the plan of care are those of the provider(s). The responsibility for follow up of recommendations given during tumor board is that of the provider.   Today's extended care, comprehensive team conference, Shirley Barton was not present for the discussion and was not examined.   Multidisciplinary Tumor Board is a multidisciplinary case peer review process.  Decisions discussed in the Multidisciplinary Tumor Board reflect the opinions of the specialists present at the conference without having examined the patient.  Ultimately, treatment and diagnostic decisions rest with the primary provider(s) and the patient.

## 2020-08-11 NOTE — Telephone Encounter (Signed)
-----   Message from Darren Wohl, MD sent at 08/11/2020  7:37 AM EDT ----- Let the patient know that thre white cell count was normal for any sign of infection. If she would like me to put in a smaller stent if she is having pain, then I will. 

## 2020-08-11 NOTE — Telephone Encounter (Signed)
Pt notified of lab results. Pt stated she would like to wait on the stent exchange to see if the pain continues to improve. Pt stated she is doing better today. Advised pt to contact our GI on call doctor over the weekend if extreme pain returns. Pt verbalized understanding.

## 2020-08-15 ENCOUNTER — Telehealth: Payer: Self-pay

## 2020-08-15 NOTE — Telephone Encounter (Signed)
Due to an unforseen issue, the physican for EUS had to cancel. There is no other ability for EUS in [place]. Procedure rescheduled at [facility] for 5/18. Ms. Rabanal has follow up arranged with Dr. Rogue Bussing for results on 5/25.

## 2020-08-18 ENCOUNTER — Ambulatory Visit: Payer: Medicare (Managed Care)

## 2020-08-18 ENCOUNTER — Encounter: Admission: RE | Payer: Self-pay | Source: Home / Self Care

## 2020-08-18 ENCOUNTER — Ambulatory Visit
Admission: RE | Admit: 2020-08-18 | Payer: Medicare (Managed Care) | Source: Home / Self Care | Admitting: Internal Medicine

## 2020-08-18 SURGERY — UPPER ENDOSCOPIC ULTRASOUND (EUS) LINEAR
Anesthesia: General

## 2020-08-24 ENCOUNTER — Inpatient Hospital Stay (HOSPITAL_BASED_OUTPATIENT_CLINIC_OR_DEPARTMENT_OTHER): Payer: Medicare (Managed Care) | Admitting: Internal Medicine

## 2020-08-24 ENCOUNTER — Inpatient Hospital Stay: Payer: Medicare (Managed Care) | Attending: Internal Medicine

## 2020-08-24 ENCOUNTER — Ambulatory Visit: Payer: Medicare (Managed Care)

## 2020-08-24 ENCOUNTER — Encounter: Payer: Self-pay | Admitting: Gastroenterology

## 2020-08-24 ENCOUNTER — Encounter: Payer: Self-pay | Admitting: Internal Medicine

## 2020-08-24 DIAGNOSIS — Z7951 Long term (current) use of inhaled steroids: Secondary | ICD-10-CM | POA: Insufficient documentation

## 2020-08-24 DIAGNOSIS — J449 Chronic obstructive pulmonary disease, unspecified: Secondary | ICD-10-CM | POA: Diagnosis not present

## 2020-08-24 DIAGNOSIS — Z87891 Personal history of nicotine dependence: Secondary | ICD-10-CM | POA: Diagnosis not present

## 2020-08-24 DIAGNOSIS — I714 Abdominal aortic aneurysm, without rupture: Secondary | ICD-10-CM | POA: Diagnosis not present

## 2020-08-24 DIAGNOSIS — C250 Malignant neoplasm of head of pancreas: Secondary | ICD-10-CM

## 2020-08-24 DIAGNOSIS — Z9221 Personal history of antineoplastic chemotherapy: Secondary | ICD-10-CM | POA: Insufficient documentation

## 2020-08-24 DIAGNOSIS — I10 Essential (primary) hypertension: Secondary | ICD-10-CM | POA: Diagnosis not present

## 2020-08-24 DIAGNOSIS — C259 Malignant neoplasm of pancreas, unspecified: Secondary | ICD-10-CM

## 2020-08-24 LAB — CBC WITH DIFFERENTIAL/PLATELET
Abs Immature Granulocytes: 0.01 10*3/uL (ref 0.00–0.07)
Basophils Absolute: 0 10*3/uL (ref 0.0–0.1)
Basophils Relative: 0 %
Eosinophils Absolute: 0.1 10*3/uL (ref 0.0–0.5)
Eosinophils Relative: 2 %
HCT: 46.4 % — ABNORMAL HIGH (ref 36.0–46.0)
Hemoglobin: 15.3 g/dL — ABNORMAL HIGH (ref 12.0–15.0)
Immature Granulocytes: 0 %
Lymphocytes Relative: 29 %
Lymphs Abs: 2.2 10*3/uL (ref 0.7–4.0)
MCH: 30.2 pg (ref 26.0–34.0)
MCHC: 33 g/dL (ref 30.0–36.0)
MCV: 91.7 fL (ref 80.0–100.0)
Monocytes Absolute: 0.5 10*3/uL (ref 0.1–1.0)
Monocytes Relative: 7 %
Neutro Abs: 4.6 10*3/uL (ref 1.7–7.7)
Neutrophils Relative %: 62 %
Platelets: 218 10*3/uL (ref 150–400)
RBC: 5.06 MIL/uL (ref 3.87–5.11)
RDW: 12.3 % (ref 11.5–15.5)
WBC: 7.4 10*3/uL (ref 4.0–10.5)
nRBC: 0 % (ref 0.0–0.2)

## 2020-08-24 LAB — COMPREHENSIVE METABOLIC PANEL
ALT: 23 U/L (ref 0–44)
AST: 26 U/L (ref 15–41)
Albumin: 4.3 g/dL (ref 3.5–5.0)
Alkaline Phosphatase: 71 U/L (ref 38–126)
Anion gap: 12 (ref 5–15)
BUN: 13 mg/dL (ref 8–23)
CO2: 27 mmol/L (ref 22–32)
Calcium: 9.5 mg/dL (ref 8.9–10.3)
Chloride: 99 mmol/L (ref 98–111)
Creatinine, Ser: 0.57 mg/dL (ref 0.44–1.00)
GFR, Estimated: >60 mL/min (ref >60)
Glucose, Bld: 104 mg/dL — ABNORMAL HIGH (ref 70–99)
Potassium: 3.8 mmol/L (ref 3.5–5.1)
Sodium: 138 mmol/L (ref 135–145)
Total Bilirubin: 1 mg/dL (ref 0.3–1.2)
Total Protein: 7.9 g/dL (ref 6.5–8.1)

## 2020-08-24 NOTE — Progress Notes (Signed)
[place] NOTE  Patient Care Team: Casilda Carls, MD as PCP - General (Internal Medicine) Rico Junker, RN as Registered Nurse Theodore Demark, RN as Registered Nurse Adron Bene as Physician Assistant (Gastroenterology) Bary Castilla Forest Gleason, MD (General Surgery) Clent Jacks, RN as Oncology Nurse Navigator  CHIEF COMPLAINTS/PURPOSE OF CONSULTATION: Pancreatic cancer  #  Oncology History Overview Note  # MAY 2022-pancreatic adenocarcinoma [obstructive jaundice-]MRI-18 mm pancreatic head mass; EUS [[facility]; Dr.Spaete]-abutment of portal vein/SMV; no lymphadenopathy noted; May 5 CA 19-9-163; s/p stenting -May 25th-47.   #Obstructive jaundice-s/p ERCP stenting [Dr.Wohl]  MAY 2022- ENDOSONOGRAPHIC FINDING: : A round mass was identified in the pancreatic head.  The mass was hypoechoic. The mass measured 16 mm by 17  mm in maximal cross-sectional diameter. The outer  margins were irregular. There was sonographic evidence  suggesting invasion into the portal vein (manifested  by abutment) and the superior mesenteric vein  (manifested by abutment).  ---------------------------------------------------------      Malignant neoplasm of head of pancreas ([place])  08/24/2020 Initial Diagnosis   Malignant neoplasm of head of pancreas ([place])   08/26/2020 -  Chemotherapy    Patient is on Treatment Plan: PANCREAS MODIFIED FOLFIRINOX Q14D X 4 CYCLES         HISTORY OF PRESENTING ILLNESS:  Shirley Barton 70 y.o.  female with a history of COPD; chronic abdominal pain is here to discuss treatment options of her newly diagnosed pancreatic cancer.  Patient was recently admitted to hospital for worsening abdominal pain/noted to have obstructive jaundice.  Patient had a MRI-that showed biliary dilatation; also pancreatic mass approximately 8 mm.  Patient underwent subsequently endoscopic ultrasound at [facility]-biopsy positive for adenocarcinoma.  Patient abdominal  pain is improved.  Not resolved.  She has history of chronic abdominal pain/chronic mild nausea.  No vomiting.  Appetite fair.  Positive weight loss.  Review of Systems  Constitutional: Positive for malaise/fatigue and weight loss. Negative for chills, diaphoresis and fever.  HENT: Negative for nosebleeds and sore throat.   Eyes: Negative for double vision.  Respiratory: Positive for shortness of breath. Negative for cough, hemoptysis, sputum production and wheezing.   Cardiovascular: Negative for chest pain, palpitations, orthopnea and leg swelling.  Gastrointestinal: Positive for abdominal pain and nausea. Negative for blood in stool, constipation, diarrhea, heartburn, melena and vomiting.  Genitourinary: Negative for dysuria, frequency and urgency.  Musculoskeletal: Negative for back pain and joint pain.  Skin: Negative.  Negative for itching and rash.  Neurological: Negative for dizziness, tingling, focal weakness, weakness and headaches.  Endo/Heme/Allergies: Does not bruise/bleed easily.  Psychiatric/Behavioral: Negative for depression. The patient is not nervous/anxious and does not have insomnia.      MEDICAL HISTORY:  Past Medical History:  Diagnosis Date  . Abdominal pain, generalized   . Anxiety   . Atypical chest pain   . Back pain   . Basal cell carcinoma of skin 2013   resected from Left scalp area.   Marland Kitchen COPD (chronic obstructive pulmonary disease) ([place])   . Depression   . DOE (dyspnea on exertion)   . GERD (gastroesophageal reflux disease)   . Hypertension   . Shortness of breath dyspnea     SURGICAL HISTORY: Past Surgical History:  Procedure Laterality Date  . BASAL CELL CARCINOMA EXCISION  2013  . CARPAL TUNNEL RELEASE Left 90s  . CHOLECYSTECTOMY N/A 09/16/2015   Procedure: LAPAROSCOPIC CHOLECYSTECTOMY WITH INTRAOPERATIVE CHOLANGIOGRAM;  Surgeon: Robert Bellow, MD;  Location: [facility]  ORS;  Service: General;  Laterality: N/A;  . COLONOSCOPY  3810"F    [place]  . CORONARY ARTERY BYPASS GRAFT    . ERCP N/A 08/05/2020   Procedure: ENDOSCOPIC RETROGRADE CHOLANGIOPANCREATOGRAPHY (ERCP);  Surgeon: Lucilla Lame, MD;  Location: [facility] ENDOSCOPY;  Service: Endoscopy;  Laterality: N/A;  . ESOPHAGOGASTRODUODENOSCOPY (EGD) WITH PROPOFOL N/A 02/05/2017   Procedure: ESOPHAGOGASTRODUODENOSCOPY (EGD) WITH PROPOFOL;  Surgeon: Lollie Sails, MD;  Location: [facility] ENDOSCOPY;  Service: Endoscopy;  Laterality: N/A;  . PERIPHERAL VASCULAR CATHETERIZATION Bilateral 12/27/2015   Procedure: Lower Extremity Angiography;  Surgeon: Katha Cabal, MD;  Location: [facility] CV LAB;  Service: Cardiovascular;  Laterality: Bilateral;  . TONSILLECTOMY      SOCIAL HISTORY: Social History   Socioeconomic History  . Marital status: Divorced    Spouse name: Not on file  . Number of children: Not on file  . Years of education: Not on file  . Highest education level: Not on file  Occupational History  . Occupation: retired  Tobacco Use  . Smoking status: Former Smoker    Quit date: 03/02/2012    Years since quitting: 8.4  . Smokeless tobacco: Never Used  Vaping Use  . Vaping Use: Never used  Substance and Sexual Activity  . Alcohol use: No    Alcohol/week: 0.0 standard drinks  . Drug use: No  . Sexual activity: Not on file  Other Topics Concern  . Not on file  Social History Narrative   [place]; self; smoker- quit 06-20-11; daughter died of leukemia-2020. 2 sons [[place]; [place]]; used to be caregiver/ retd.    Social Determinants of Health   Financial Resource Strain: Not on file  Food Insecurity: Not on file  Transportation Needs: Not on file  Physical Activity: Not on file  Stress: Not on file  Social Connections: Not on file  Intimate Partner Violence: Not on file    FAMILY HISTORY: Family History  Problem Relation Age of Onset  . Cerebral palsy Daughter   . Colon polyps Sister   . Leukemia Daughter   . Depression Son   . Breast cancer  Neg Hx     ALLERGIES:  is allergic to tramadol, cortisone, cortizone-10 [hydrocortisone], and penicillins.  MEDICATIONS:  Current Outpatient Medications  Medication Sig Dispense Refill  . acetaminophen (TYLENOL) 325 MG tablet Take 650 mg by mouth daily.    . B Complex-C-Folic Acid (B COMPLEX-VITAMIN C-FOLIC ACID) 1 MG tablet Take 1 tablet by mouth daily with breakfast. 30 tablet 0  . cetirizine (ZYRTEC) 10 MG tablet Take 10 mg by mouth daily.    . Fluticasone-Salmeterol (ADVAIR) 100-50 MCG/DOSE AEPB Inhale 1 puff into the lungs 2 (two) times daily.    . ondansetron (ZOFRAN ODT) 4 MG disintegrating tablet Take 1 tablet (4 mg total) by mouth every 6 (six) hours as needed for nausea or vomiting. 20 tablet 0  . pantoprazole (PROTONIX) 40 MG tablet Take by mouth.     No current facility-administered medications for this visit.      Marland Kitchen  PHYSICAL EXAMINATION: ECOG PERFORMANCE STATUS: 0 - Asymptomatic  Vitals:   08/24/20 1501  BP: 136/87  Pulse: 91  Resp: 16  Temp: 98.9 F (37.2 C)  SpO2: 97%   Filed Weights   08/24/20 1501  Weight: 103 lb 1.6 oz (46.8 kg)    Physical Exam Constitutional:      Comments: Caucasian female patient ambulating independently.  Alone.  HENT:     Head: Normocephalic and atraumatic.  Mouth/Throat:     Pharynx: No oropharyngeal exudate.  Eyes:     Pupils: Pupils are equal, round, and reactive to light.  Cardiovascular:     Rate and Rhythm: Normal rate and regular rhythm.  Pulmonary:     Effort: No respiratory distress.     Breath sounds: No wheezing.     Comments: Decreased air entry bilaterally.  No wheeze or crackles Abdominal:     General: Bowel sounds are normal. There is no distension.     Palpations: Abdomen is soft. There is no mass.     Tenderness: There is no abdominal tenderness. There is no guarding or rebound.  Musculoskeletal:        General: No tenderness. Normal range of motion.     Cervical back: Normal range of motion and  neck supple.  Skin:    General: Skin is warm.  Neurological:     Mental Status: She is alert and oriented to person, place, and time.  Psychiatric:        Mood and Affect: Affect normal.      LABORATORY DATA:  I have reviewed the data as listed Lab Results  Component Value Date   WBC 7.4 08/24/2020   HGB 15.3 (H) 08/24/2020   HCT 46.4 (H) 08/24/2020   MCV 91.7 08/24/2020   PLT 218 08/24/2020   Recent Labs    08/04/20 0948 08/04/20 1138 08/06/20 0457 08/24/20 1435  NA 138  --  136 138  K 3.8  --  4.0 3.8  CL 100  --  100 99  CO2 26  --  29 27  GLUCOSE 104*  --  105* 104*  BUN <5*  --  6* 13  CREATININE 0.52  --  0.67 0.57  CALCIUM 9.9  --  8.4* 9.5  GFRNONAA >60  --  >60 >60  PROT 7.8  --  6.0* 7.9  ALBUMIN 4.2  --  3.0* 4.3  AST 155*  --  118* 26  ALT 398*  --  254* 23  ALKPHOS 205*  --  138* 71  BILITOT 5.6*  --  1.9* 1.0  BILIDIR  --  3.7*  --   --     RADIOGRAPHIC STUDIES: I have personally reviewed the radiological images as listed and agreed with the findings in the report. CT ABDOMEN PELVIS W CONTRAST  Result Date: 08/04/2020 CLINICAL DATA:  Abdominal pain with nausea and vomiting EXAM: CT ABDOMEN AND PELVIS WITH CONTRAST TECHNIQUE: Multidetector CT imaging of the abdomen and pelvis was performed using the standard protocol following bolus administration of intravenous contrast. CONTRAST:  5mL OMNIPAQUE IOHEXOL 300 MG/ML  SOLN COMPARISON:  April 06, 2017 FINDINGS: Lower chest: No lung base edema or consolidation evident. Suggestion of bibasilar underlying emphysematous change. Hepatobiliary: No focal liver lesions are evident. Gallbladder is absent. There is generalized intrahepatic biliary duct dilatation. There is dilatation of the common hepatic and common bile ducts to the level of the ampulla. There is mild enhancement at the level of the ampulla without well-defined mass appreciable. Pancreas: There is pancreatic duct dilatation measuring just under 5 mm  at its maximum. No well-defined pancreatic mass is seen by CT. No peripancreatic fluid. Spleen: No splenic lesions are evident. Adrenals/Urinary Tract: Adrenals bilaterally appear normal. There is a 4 mm apparent cyst in the lower pole right kidney. No evident hydronephrosis on either side. Extrarenal pelvis on each side, an anatomic variant. No appreciable renal or ureteral calculus on either side. Urinary bladder  is midline with wall thickness within normal limits. Stomach/Bowel: Moderate stool throughout colon noted. There is wall thickening in the gastric antrum. There is no appreciable small or large bowel wall thickening. No evident bowel obstruction. Terminal ileum appears normal. No evident appendiceal inflammation. No free air or portal venous air. Vascular/Lymphatic: There is aneurysmal dilatation of the distal abdominal aorta extending to the bifurcation with a maximum transverse diameter of 3.4 x 3.3 cm. There is thrombus in the periphery of this aneurysmal dilatation. There are stents in each common iliac artery. Extensive aortic and pelvic arterial atherosclerotic calcification noted. Calcification noted in each proximal renal artery. Major venous structures appear patent. No evident adenopathy in the abdomen or pelvis. Reproductive: Uterus is anteverted.  No adnexal masses evident. Other: No appreciable abscess or ascites in the abdomen or pelvis. Musculoskeletal: No blastic or lytic bone lesions. There are degenerative changes in the lumbar spine. No appreciable abdominal wall or intramuscular lesions. IMPRESSION: 1. Intrahepatic and extrahepatic biliary duct dilatation which is greater than is expected with cholecystectomy state. Mild enhancement is noted at the ampulla. There is also a degree of pancreatic duct dilatation. These findings raise concern for potential neoplastic lesion at or near the ampulla. A well-defined mass is not appreciated by CT. These findings are sufficiently suspicious for  potential lesion in this area to warrant correlation with abdominal MR pre and post-contrast as well as MRCP to further evaluate. Gallbladder is absent. 2. Wall thickening in the gastric antrum concerning for gastritis in this area. No other bowel wall thickening. No bowel obstruction. No abscess in the abdomen or pelvis. Appendix region appears unremarkable. 3. Abdominal aortic aneurysm with maximum transverse diameter of 3.4 x 3.3 cm. Recommend follow-up ultrasound every 3 years. This recommendation follows ACR consensus guidelines: White Paper of the ACR Incidental Findings Committee II on Vascular Findings. J Am Coll Radiol 2013; 10:789-794. thrombus noted in the periphery of this aneurysm. There are stents in each iliac artery. There is aortic and multifocal pelvic arterial vascular calcification. 4. No renal or ureteral calculus. No hydronephrosis. Urinary bladder wall thickness normal. Electronically Signed   By: Lowella Grip III M.D.   On: 08/04/2020 13:04   MR 3D Recon At Scanner  Result Date: 08/04/2020 CLINICAL DATA:  49 female with intra and extrahepatic biliary duct dilatation. Postcholecystectomy. EXAM: MRI ABDOMEN WITHOUT AND WITH CONTRAST (INCLUDING MRCP) TECHNIQUE: Multiplanar multisequence MR imaging of the abdomen was performed both before and after the administration of intravenous contrast. Heavily T2-weighted images of the biliary and pancreatic ducts were obtained, and three-dimensional MRCP images were rendered by post processing. CONTRAST:  23mL GADAVIST GADOBUTROL 1 MMOL/ML IV SOLN COMPARISON:  CT 08/04/2020, 10/19/2015 FINDINGS: Lower chest:  Lung bases clear Hepatobiliary: Marked intra and extrahepatic biliary duct dilatation. Extrahepatic bile ducts measure up to 13 mm. Common bile duct measures up to 13 mm. No filling defect within the common bile duct. There is abrupt narrowing of the distal common bile duct with near complete loss of the lumen over a 7 mm segment  (image 10/MRCP MIP sequence). The most distal common bile duct reconstitutes to normal caliber (3 mm) over a 26 mm segment leading up to the ampulla. This stricturing is seen on coronal postcontrast T1 weighted imaging (image 40/25). There is enhancing a lesion the RIGHT hepatic lobe (image 14/9) appears to have a vascular pattern. Lesion is subtly evident on more delayed imaging (series 21). Lesion fades to isointense on the 90 second imaging. No additional enhancing  hepatic lesions. Pancreas: Within the lateral aspect of the pancreatic head, there is a subtle hypointensity on noncontrast T1 weighted imaging measuring 10 mm x 10 mm on image 41/series 18. There is mild pancreatic duct dilatation proximal to this lesion on same image. The duct is lost through this region. On the delayed vascular imaging this same region is more difficult to define. There subtle ductal interruption in the same vicinity of the stricturing of the common bile duct. (Image 43/24 ) No periportal or peripancreatic lymphadenopathy. Spleen: Normal spleen. Adrenals/urinary tract: Adrenal glands and kidneys are normal. Stomach/Bowel: Stomach and limited of the small bowel is unremarkable Vascular/Lymphatic: Abdominal aortic normal caliber. No retroperitoneal periportal lymphadenopathy. Musculoskeletal: No aggressive osseous lesion IMPRESSION: 1. 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 Differential would include benign and malignant stricturing of the distal common bile duct versus is external mass lesion constricting the duct. Benign stricturing would include inflammation versus cholangiocarcinoma. External compression differential would include pancreatic adenocarcinoma. 2. Subtle lesion in the head of the pancreas. Subtle interruption of the pancreatic duct through the head of the pancreas. These findings favor a pancreatic neoplasm (adenocarcinoma) as source of ductal  obstruction. 3. Enhancing lesion the RIGHT hepatic lobe is favored benign vascular lesion. 4. No lymphadenopathy identified. 5. Recommend endoscopic ultrasound for evaluation of the pancreatic head / distal common bile duct. Electronically Signed   By: Suzy Bouchard M.D.   On: 08/04/2020 15:09   DG C-Arm 1-60 Min-No Report  Result Date: 08/05/2020 Fluoroscopy was utilized by the requesting physician.  No radiographic interpretation.   MR ABDOMEN MRCP W WO CONTAST  Result Date: 08/04/2020 CLINICAL DATA:  52 female with intra and extrahepatic biliary duct dilatation. Postcholecystectomy. EXAM: MRI ABDOMEN WITHOUT AND WITH CONTRAST (INCLUDING MRCP) TECHNIQUE: Multiplanar multisequence MR imaging of the abdomen was performed both before and after the administration of intravenous contrast. Heavily T2-weighted images of the biliary and pancreatic ducts were obtained, and three-dimensional MRCP images were rendered by post processing. CONTRAST:  39mL GADAVIST GADOBUTROL 1 MMOL/ML IV SOLN COMPARISON:  CT 08/04/2020, 10/19/2015 FINDINGS: Lower chest:  Lung bases clear Hepatobiliary: Marked intra and extrahepatic biliary duct dilatation. Extrahepatic bile ducts measure up to 13 mm. Common bile duct measures up to 13 mm. No filling defect within the common bile duct. There is abrupt narrowing of the distal common bile duct with near complete loss of the lumen over a 7 mm segment (image 10/MRCP MIP sequence). The most distal common bile duct reconstitutes to normal caliber (3 mm) over a 26 mm segment leading up to the ampulla. This stricturing is seen on coronal postcontrast T1 weighted imaging (image 40/25). There is enhancing a lesion the RIGHT hepatic lobe (image 14/9) appears to have a vascular pattern. Lesion is subtly evident on more delayed imaging (series 21). Lesion fades to isointense on the 90 second imaging. No additional enhancing hepatic lesions. Pancreas: Within the lateral aspect of the  pancreatic head, there is a subtle hypointensity on noncontrast T1 weighted imaging measuring 10 mm x 10 mm on image 41/series 18. There is mild pancreatic duct dilatation proximal to this lesion on same image. The duct is lost through this region. On the delayed vascular imaging this same region is more difficult to define. There subtle ductal interruption in the same vicinity of the stricturing of the common bile duct. (Image 43/24 ) No periportal or peripancreatic lymphadenopathy. Spleen: Normal spleen. Adrenals/urinary tract: Adrenal glands and kidneys  are normal. Stomach/Bowel: Stomach and limited of the small bowel is unremarkable Vascular/Lymphatic: Abdominal aortic normal caliber. No retroperitoneal periportal lymphadenopathy. Musculoskeletal: No aggressive osseous lesion IMPRESSION: 1. 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 Differential would include benign and malignant stricturing of the distal common bile duct versus is external mass lesion constricting the duct. Benign stricturing would include inflammation versus cholangiocarcinoma. External compression differential would include pancreatic adenocarcinoma. 2. Subtle lesion in the head of the pancreas. Subtle interruption of the pancreatic duct through the head of the pancreas. These findings favor a pancreatic neoplasm (adenocarcinoma) as source of ductal obstruction. 3. Enhancing lesion the RIGHT hepatic lobe is favored benign vascular lesion. 4. No lymphadenopathy identified. 5. Recommend endoscopic ultrasound for evaluation of the pancreatic head / distal common bile duct. Electronically Signed   By: Suzy Bouchard M.D.   On: 08/04/2020 15:09    ASSESSMENT & PLAN:   Malignant neoplasm of head of pancreas [facility]) #Pancreatic adenocarcinoma pT1 however on US-concerning for tumor abutting the portal vein.  Stage I; elevated CA 19-9. Chest ASAP for staging purposes.  #I had a long  discussion with patient regarding the serious diagnosis of pancreatic adenocarcinoma given the stage I.  Patient will need surgical evaluation for resection.  Patient recommended to [facility] appointment with Dr. Manuella Ghazi at [place].  #Had a long discussion the patient regarding given the EUS finding-of portal vein abutment/considered borderline resectable .  As such neoadjuvant chemotherapy  would be recommended.  Recommend aggressive chemotherapy with FOLFIRINOX ~understanding response rates are about 30%.  Patient understands chemotherapy is aggressive; she will need a port.  Also discussed the potential side effects including but not limited to nausea vomiting diarrhea neuropathy.   #Obstructive jaundice-secondary to pancreatic adenocarcinoma/status post ERCP with stenting; LFTs normalized.   #COPD/currently quit smoking not on home O2; on inhalers stable.  I spoke at length with the patient's son- regarding the patient's clinical status/plan of care.  Family agreement.  Recommended that son accompany patient to Dr. Trena Platt appointment.  Thank you  for allowing me to participate in the care of your pleasant patient. Please do not hesitate to contact me with questions or concerns in the interim.  # DISPOSITION: # CT chest ASAP # follow up TBD- Dr.B  All questions were answered. The patient knows to call the clinic with any problems, questions or concerns.    Cammie Sickle, MD 08/26/2020 5:56 AM

## 2020-08-24 NOTE — Patient Instructions (Signed)
#   please make appt with Dr.Shah ASAP.

## 2020-08-24 NOTE — H&P (View-Only) (Signed)
[facility] NOTE  Patient Care Team: Casilda Carls, MD as PCP - General (Internal Medicine) Rico Junker, RN as Registered Nurse Theodore Demark, RN as Registered Nurse Adron Bene as Physician Assistant (Gastroenterology) Bary Castilla Forest Gleason, MD (General Surgery) Clent Jacks, RN as Oncology Nurse Navigator  CHIEF COMPLAINTS/PURPOSE OF CONSULTATION: Pancreatic cancer  #  Oncology History Overview Note  # MAY 2022-pancreatic adenocarcinoma [obstructive jaundice-]MRI-18 mm pancreatic head mass; EUS [[facility]; Dr.Spaete]-abutment of portal vein/SMV; no lymphadenopathy noted; May 5 CA 19-9-163; s/p stenting -May 25th-47.   #Obstructive jaundice-s/p ERCP stenting [Dr.Wohl]  MAY 2022- ENDOSONOGRAPHIC FINDING: : A round mass was identified in the pancreatic head.  The mass was hypoechoic. The mass measured 16 mm by 17  mm in maximal cross-sectional diameter. The outer  margins were irregular. There was sonographic evidence  suggesting invasion into the portal vein (manifested  by abutment) and the superior mesenteric vein  (manifested by abutment).  ---------------------------------------------------------      Malignant neoplasm of head of pancreas ([place])  08/24/2020 Initial Diagnosis   Malignant neoplasm of head of pancreas ([place])   08/26/2020 -  Chemotherapy    Patient is on Treatment Plan: PANCREAS MODIFIED FOLFIRINOX Q14D X 4 CYCLES         HISTORY OF PRESENTING ILLNESS:  Shirley Barton 70 y.o.  female with a history of COPD; chronic abdominal pain is here to discuss treatment options of her newly diagnosed pancreatic cancer.  Patient was recently admitted to hospital for worsening abdominal pain/noted to have obstructive jaundice.  Patient had a MRI-that showed biliary dilatation; also pancreatic mass approximately 8 mm.  Patient underwent subsequently endoscopic ultrasound at [facility]-biopsy positive for adenocarcinoma.  Patient abdominal  pain is improved.  Not resolved.  She has history of chronic abdominal pain/chronic mild nausea.  No vomiting.  Appetite fair.  Positive weight loss.  Review of Systems  Constitutional: Positive for malaise/fatigue and weight loss. Negative for chills, diaphoresis and fever.  HENT: Negative for nosebleeds and sore throat.   Eyes: Negative for double vision.  Respiratory: Positive for shortness of breath. Negative for cough, hemoptysis, sputum production and wheezing.   Cardiovascular: Negative for chest pain, palpitations, orthopnea and leg swelling.  Gastrointestinal: Positive for abdominal pain and nausea. Negative for blood in stool, constipation, diarrhea, heartburn, melena and vomiting.  Genitourinary: Negative for dysuria, frequency and urgency.  Musculoskeletal: Negative for back pain and joint pain.  Skin: Negative.  Negative for itching and rash.  Neurological: Negative for dizziness, tingling, focal weakness, weakness and headaches.  Endo/Heme/Allergies: Does not bruise/bleed easily.  Psychiatric/Behavioral: Negative for depression. The patient is not nervous/anxious and does not have insomnia.      MEDICAL HISTORY:  Past Medical History:  Diagnosis Date  . Abdominal pain, generalized   . Anxiety   . Atypical chest pain   . Back pain   . Basal cell carcinoma of skin 2013   resected from Left scalp area.   Marland Kitchen COPD (chronic obstructive pulmonary disease) ([place])   . Depression   . DOE (dyspnea on exertion)   . GERD (gastroesophageal reflux disease)   . Hypertension   . Shortness of breath dyspnea     SURGICAL HISTORY: Past Surgical History:  Procedure Laterality Date  . BASAL CELL CARCINOMA EXCISION  2013  . CARPAL TUNNEL RELEASE Left 90s  . CHOLECYSTECTOMY N/A 09/16/2015   Procedure: LAPAROSCOPIC CHOLECYSTECTOMY WITH INTRAOPERATIVE CHOLANGIOGRAM;  Surgeon: Robert Bellow, MD;  Location: [facility]  ORS;  Service: General;  Laterality: N/A;  . COLONOSCOPY  6440"H    [place]  . CORONARY ARTERY BYPASS GRAFT    . ERCP N/A 08/05/2020   Procedure: ENDOSCOPIC RETROGRADE CHOLANGIOPANCREATOGRAPHY (ERCP);  Surgeon: Lucilla Lame, MD;  Location: [facility] ENDOSCOPY;  Service: Endoscopy;  Laterality: N/A;  . ESOPHAGOGASTRODUODENOSCOPY (EGD) WITH PROPOFOL N/A 02/05/2017   Procedure: ESOPHAGOGASTRODUODENOSCOPY (EGD) WITH PROPOFOL;  Surgeon: Lollie Sails, MD;  Location: [facility] ENDOSCOPY;  Service: Endoscopy;  Laterality: N/A;  . PERIPHERAL VASCULAR CATHETERIZATION Bilateral 12/27/2015   Procedure: Lower Extremity Angiography;  Surgeon: Katha Cabal, MD;  Location: [place] CV LAB;  Service: Cardiovascular;  Laterality: Bilateral;  . TONSILLECTOMY      SOCIAL HISTORY: Social History   Socioeconomic History  . Marital status: Divorced    Spouse name: Not on file  . Number of children: Not on file  . Years of education: Not on file  . Highest education level: Not on file  Occupational History  . Occupation: retired  Tobacco Use  . Smoking status: Former Smoker    Quit date: 03/02/2012    Years since quitting: 8.4  . Smokeless tobacco: Never Used  Vaping Use  . Vaping Use: Never used  Substance and Sexual Activity  . Alcohol use: No    Alcohol/week: 0.0 standard drinks  . Drug use: No  . Sexual activity: Not on file  Other Topics Concern  . Not on file  Social History Narrative   [place]; self; smoker- quit 06-29-11; daughter died of leukemia-2020. 2 sons [[place]; [place]]; used to be caregiver/ retd.    Social Determinants of Health   Financial Resource Strain: Not on file  Food Insecurity: Not on file  Transportation Needs: Not on file  Physical Activity: Not on file  Stress: Not on file  Social Connections: Not on file  Intimate Partner Violence: Not on file    FAMILY HISTORY: Family History  Problem Relation Age of Onset  . Cerebral palsy Daughter   . Colon polyps Sister   . Leukemia Daughter   . Depression Son   . Breast cancer  Neg Hx     ALLERGIES:  is allergic to tramadol, cortisone, cortizone-10 [hydrocortisone], and penicillins.  MEDICATIONS:  Current Outpatient Medications  Medication Sig Dispense Refill  . acetaminophen (TYLENOL) 325 MG tablet Take 650 mg by mouth daily.    . B Complex-C-Folic Acid (B COMPLEX-VITAMIN C-FOLIC ACID) 1 MG tablet Take 1 tablet by mouth daily with breakfast. 30 tablet 0  . cetirizine (ZYRTEC) 10 MG tablet Take 10 mg by mouth daily.    . Fluticasone-Salmeterol (ADVAIR) 100-50 MCG/DOSE AEPB Inhale 1 puff into the lungs 2 (two) times daily.    . ondansetron (ZOFRAN ODT) 4 MG disintegrating tablet Take 1 tablet (4 mg total) by mouth every 6 (six) hours as needed for nausea or vomiting. 20 tablet 0  . pantoprazole (PROTONIX) 40 MG tablet Take by mouth.     No current facility-administered medications for this visit.      Marland Kitchen  PHYSICAL EXAMINATION: ECOG PERFORMANCE STATUS: 0 - Asymptomatic  Vitals:   08/24/20 1501  BP: 136/87  Pulse: 91  Resp: 16  Temp: 98.9 F (37.2 C)  SpO2: 97%   Filed Weights   08/24/20 1501  Weight: 103 lb 1.6 oz (46.8 kg)    Physical Exam Constitutional:      Comments: Caucasian female patient ambulating independently.  Alone.  HENT:     Head: Normocephalic and atraumatic.  Mouth/Throat:     Pharynx: No oropharyngeal exudate.  Eyes:     Pupils: Pupils are equal, round, and reactive to light.  Cardiovascular:     Rate and Rhythm: Normal rate and regular rhythm.  Pulmonary:     Effort: No respiratory distress.     Breath sounds: No wheezing.     Comments: Decreased air entry bilaterally.  No wheeze or crackles Abdominal:     General: Bowel sounds are normal. There is no distension.     Palpations: Abdomen is soft. There is no mass.     Tenderness: There is no abdominal tenderness. There is no guarding or rebound.  Musculoskeletal:        General: No tenderness. Normal range of motion.     Cervical back: Normal range of motion and  neck supple.  Skin:    General: Skin is warm.  Neurological:     Mental Status: She is alert and oriented to person, place, and time.  Psychiatric:        Mood and Affect: Affect normal.      LABORATORY DATA:  I have reviewed the data as listed Lab Results  Component Value Date   WBC 7.4 08/24/2020   HGB 15.3 (H) 08/24/2020   HCT 46.4 (H) 08/24/2020   MCV 91.7 08/24/2020   PLT 218 08/24/2020   Recent Labs    08/04/20 0948 08/04/20 1138 08/06/20 0457 08/24/20 1435  NA 138  --  136 138  K 3.8  --  4.0 3.8  CL 100  --  100 99  CO2 26  --  29 27  GLUCOSE 104*  --  105* 104*  BUN <5*  --  6* 13  CREATININE 0.52  --  0.67 0.57  CALCIUM 9.9  --  8.4* 9.5  GFRNONAA >60  --  >60 >60  PROT 7.8  --  6.0* 7.9  ALBUMIN 4.2  --  3.0* 4.3  AST 155*  --  118* 26  ALT 398*  --  254* 23  ALKPHOS 205*  --  138* 71  BILITOT 5.6*  --  1.9* 1.0  BILIDIR  --  3.7*  --   --     RADIOGRAPHIC STUDIES: I have personally reviewed the radiological images as listed and agreed with the findings in the report. CT ABDOMEN PELVIS W CONTRAST  Result Date: 08/04/2020 CLINICAL DATA:  Abdominal pain with nausea and vomiting EXAM: CT ABDOMEN AND PELVIS WITH CONTRAST TECHNIQUE: Multidetector CT imaging of the abdomen and pelvis was performed using the standard protocol following bolus administration of intravenous contrast. CONTRAST:  81mL OMNIPAQUE IOHEXOL 300 MG/ML  SOLN COMPARISON:  April 06, 2017 FINDINGS: Lower chest: No lung base edema or consolidation evident. Suggestion of bibasilar underlying emphysematous change. Hepatobiliary: No focal liver lesions are evident. Gallbladder is absent. There is generalized intrahepatic biliary duct dilatation. There is dilatation of the common hepatic and common bile ducts to the level of the ampulla. There is mild enhancement at the level of the ampulla without well-defined mass appreciable. Pancreas: There is pancreatic duct dilatation measuring just under 5 mm  at its maximum. No well-defined pancreatic mass is seen by CT. No peripancreatic fluid. Spleen: No splenic lesions are evident. Adrenals/Urinary Tract: Adrenals bilaterally appear normal. There is a 4 mm apparent cyst in the lower pole right kidney. No evident hydronephrosis on either side. Extrarenal pelvis on each side, an anatomic variant. No appreciable renal or ureteral calculus on either side. Urinary bladder  is midline with wall thickness within normal limits. Stomach/Bowel: Moderate stool throughout colon noted. There is wall thickening in the gastric antrum. There is no appreciable small or large bowel wall thickening. No evident bowel obstruction. Terminal ileum appears normal. No evident appendiceal inflammation. No free air or portal venous air. Vascular/Lymphatic: There is aneurysmal dilatation of the distal abdominal aorta extending to the bifurcation with a maximum transverse diameter of 3.4 x 3.3 cm. There is thrombus in the periphery of this aneurysmal dilatation. There are stents in each common iliac artery. Extensive aortic and pelvic arterial atherosclerotic calcification noted. Calcification noted in each proximal renal artery. Major venous structures appear patent. No evident adenopathy in the abdomen or pelvis. Reproductive: Uterus is anteverted.  No adnexal masses evident. Other: No appreciable abscess or ascites in the abdomen or pelvis. Musculoskeletal: No blastic or lytic bone lesions. There are degenerative changes in the lumbar spine. No appreciable abdominal wall or intramuscular lesions. IMPRESSION: 1. Intrahepatic and extrahepatic biliary duct dilatation which is greater than is expected with cholecystectomy state. Mild enhancement is noted at the ampulla. There is also a degree of pancreatic duct dilatation. These findings raise concern for potential neoplastic lesion at or near the ampulla. A well-defined mass is not appreciated by CT. These findings are sufficiently suspicious for  potential lesion in this area to warrant correlation with abdominal MR pre and post-contrast as well as MRCP to further evaluate. Gallbladder is absent. 2. Wall thickening in the gastric antrum concerning for gastritis in this area. No other bowel wall thickening. No bowel obstruction. No abscess in the abdomen or pelvis. Appendix region appears unremarkable. 3. Abdominal aortic aneurysm with maximum transverse diameter of 3.4 x 3.3 cm. Recommend follow-up ultrasound every 3 years. This recommendation follows ACR consensus guidelines: White Paper of the ACR Incidental Findings Committee II on Vascular Findings. J Am Coll Radiol 2013; 10:789-794. thrombus noted in the periphery of this aneurysm. There are stents in each iliac artery. There is aortic and multifocal pelvic arterial vascular calcification. 4. No renal or ureteral calculus. No hydronephrosis. Urinary bladder wall thickness normal. Electronically Signed   By: Lowella Grip III M.D.   On: 08/04/2020 13:04   MR 3D Recon At Scanner  Result Date: 08/04/2020 CLINICAL DATA:  3 female with intra and extrahepatic biliary duct dilatation. Postcholecystectomy. EXAM: MRI ABDOMEN WITHOUT AND WITH CONTRAST (INCLUDING MRCP) TECHNIQUE: Multiplanar multisequence MR imaging of the abdomen was performed both before and after the administration of intravenous contrast. Heavily T2-weighted images of the biliary and pancreatic ducts were obtained, and three-dimensional MRCP images were rendered by post processing. CONTRAST:  45mL GADAVIST GADOBUTROL 1 MMOL/ML IV SOLN COMPARISON:  CT 08/04/2020, 10/19/2015 FINDINGS: Lower chest:  Lung bases clear Hepatobiliary: Marked intra and extrahepatic biliary duct dilatation. Extrahepatic bile ducts measure up to 13 mm. Common bile duct measures up to 13 mm. No filling defect within the common bile duct. There is abrupt narrowing of the distal common bile duct with near complete loss of the lumen over a 7 mm segment  (image 10/MRCP MIP sequence). The most distal common bile duct reconstitutes to normal caliber (3 mm) over a 26 mm segment leading up to the ampulla. This stricturing is seen on coronal postcontrast T1 weighted imaging (image 40/25). There is enhancing a lesion the RIGHT hepatic lobe (image 14/9) appears to have a vascular pattern. Lesion is subtly evident on more delayed imaging (series 21). Lesion fades to isointense on the 90 second imaging. No additional enhancing  hepatic lesions. Pancreas: Within the lateral aspect of the pancreatic head, there is a subtle hypointensity on noncontrast T1 weighted imaging measuring 10 mm x 10 mm on image 41/series 18. There is mild pancreatic duct dilatation proximal to this lesion on same image. The duct is lost through this region. On the delayed vascular imaging this same region is more difficult to define. There subtle ductal interruption in the same vicinity of the stricturing of the common bile duct. (Image 43/24 ) No periportal or peripancreatic lymphadenopathy. Spleen: Normal spleen. Adrenals/urinary tract: Adrenal glands and kidneys are normal. Stomach/Bowel: Stomach and limited of the small bowel is unremarkable Vascular/Lymphatic: Abdominal aortic normal caliber. No retroperitoneal periportal lymphadenopathy. Musculoskeletal: No aggressive osseous lesion IMPRESSION: 1. 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 Differential would include benign and malignant stricturing of the distal common bile duct versus is external mass lesion constricting the duct. Benign stricturing would include inflammation versus cholangiocarcinoma. External compression differential would include pancreatic adenocarcinoma. 2. Subtle lesion in the head of the pancreas. Subtle interruption of the pancreatic duct through the head of the pancreas. These findings favor a pancreatic neoplasm (adenocarcinoma) as source of ductal  obstruction. 3. Enhancing lesion the RIGHT hepatic lobe is favored benign vascular lesion. 4. No lymphadenopathy identified. 5. Recommend endoscopic ultrasound for evaluation of the pancreatic head / distal common bile duct. Electronically Signed   By: Suzy Bouchard M.D.   On: 08/04/2020 15:09   DG C-Arm 1-60 Min-No Report  Result Date: 08/05/2020 Fluoroscopy was utilized by the requesting physician.  No radiographic interpretation.   MR ABDOMEN MRCP W WO CONTAST  Result Date: 08/04/2020 CLINICAL DATA:  61 female with intra and extrahepatic biliary duct dilatation. Postcholecystectomy. EXAM: MRI ABDOMEN WITHOUT AND WITH CONTRAST (INCLUDING MRCP) TECHNIQUE: Multiplanar multisequence MR imaging of the abdomen was performed both before and after the administration of intravenous contrast. Heavily T2-weighted images of the biliary and pancreatic ducts were obtained, and three-dimensional MRCP images were rendered by post processing. CONTRAST:  33mL GADAVIST GADOBUTROL 1 MMOL/ML IV SOLN COMPARISON:  CT 08/04/2020, 10/19/2015 FINDINGS: Lower chest:  Lung bases clear Hepatobiliary: Marked intra and extrahepatic biliary duct dilatation. Extrahepatic bile ducts measure up to 13 mm. Common bile duct measures up to 13 mm. No filling defect within the common bile duct. There is abrupt narrowing of the distal common bile duct with near complete loss of the lumen over a 7 mm segment (image 10/MRCP MIP sequence). The most distal common bile duct reconstitutes to normal caliber (3 mm) over a 26 mm segment leading up to the ampulla. This stricturing is seen on coronal postcontrast T1 weighted imaging (image 40/25). There is enhancing a lesion the RIGHT hepatic lobe (image 14/9) appears to have a vascular pattern. Lesion is subtly evident on more delayed imaging (series 21). Lesion fades to isointense on the 90 second imaging. No additional enhancing hepatic lesions. Pancreas: Within the lateral aspect of the  pancreatic head, there is a subtle hypointensity on noncontrast T1 weighted imaging measuring 10 mm x 10 mm on image 41/series 18. There is mild pancreatic duct dilatation proximal to this lesion on same image. The duct is lost through this region. On the delayed vascular imaging this same region is more difficult to define. There subtle ductal interruption in the same vicinity of the stricturing of the common bile duct. (Image 43/24 ) No periportal or peripancreatic lymphadenopathy. Spleen: Normal spleen. Adrenals/urinary tract: Adrenal glands and kidneys  are normal. Stomach/Bowel: Stomach and limited of the small bowel is unremarkable Vascular/Lymphatic: Abdominal aortic normal caliber. No retroperitoneal periportal lymphadenopathy. Musculoskeletal: No aggressive osseous lesion IMPRESSION: 1. 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 Differential would include benign and malignant stricturing of the distal common bile duct versus is external mass lesion constricting the duct. Benign stricturing would include inflammation versus cholangiocarcinoma. External compression differential would include pancreatic adenocarcinoma. 2. Subtle lesion in the head of the pancreas. Subtle interruption of the pancreatic duct through the head of the pancreas. These findings favor a pancreatic neoplasm (adenocarcinoma) as source of ductal obstruction. 3. Enhancing lesion the RIGHT hepatic lobe is favored benign vascular lesion. 4. No lymphadenopathy identified. 5. Recommend endoscopic ultrasound for evaluation of the pancreatic head / distal common bile duct. Electronically Signed   By: Suzy Bouchard M.D.   On: 08/04/2020 15:09    ASSESSMENT & PLAN:   Malignant neoplasm of head of pancreas [facility]) #Pancreatic adenocarcinoma pT1 however on US-concerning for tumor abutting the portal vein.  Stage I; elevated CA 19-9. Chest ASAP for staging purposes.  #I had a long  discussion with patient regarding the serious diagnosis of pancreatic adenocarcinoma given the stage I.  Patient will need surgical evaluation for resection.  Patient recommended to [facility] appointment with Dr. Manuella Ghazi at [place].  #Had a long discussion the patient regarding given the EUS finding-of portal vein abutment/considered borderline resectable .  As such neoadjuvant chemotherapy  would be recommended.  Recommend aggressive chemotherapy with FOLFIRINOX ~understanding response rates are about 30%.  Patient understands chemotherapy is aggressive; she will need a port.  Also discussed the potential side effects including but not limited to nausea vomiting diarrhea neuropathy.   #Obstructive jaundice-secondary to pancreatic adenocarcinoma/status post ERCP with stenting; LFTs normalized.   #COPD/currently quit smoking not on home O2; on inhalers stable.  I spoke at length with the patient's son- regarding the patient's clinical status/plan of care.  Family agreement.  Recommended that son accompany patient to Dr. Trena Platt appointment.  Thank you  for allowing me to participate in the care of your pleasant patient. Please do not hesitate to contact me with questions or concerns in the interim.  # DISPOSITION: # CT chest ASAP # follow up TBD- Dr.B  All questions were answered. The patient knows to call the clinic with any problems, questions or concerns.    Cammie Sickle, MD 08/26/2020 5:56 AM

## 2020-08-24 NOTE — Assessment & Plan Note (Addendum)
#  Pancreatic adenocarcinoma pT1 however on US-concerning for tumor abutting the portal vein.  Stage I; elevated CA 19-9. Chest ASAP for staging purposes.  #I had a long discussion with patient regarding the serious diagnosis of pancreatic adenocarcinoma given the stage I.  Patient will need surgical evaluation for resection.  Patient recommended to [facility] appointment with Dr. Manuella Ghazi at [place].  #Had a long discussion the patient regarding given the EUS finding-of portal vein abutment/considered borderline resectable .  As such neoadjuvant chemotherapy  would be recommended.  Recommend aggressive chemotherapy with FOLFIRINOX ~understanding response rates are about 30%.  Patient understands chemotherapy is aggressive; she will need a port.  Also discussed the potential side effects including but not limited to nausea vomiting diarrhea neuropathy.   #Obstructive jaundice-secondary to pancreatic adenocarcinoma/status post ERCP with stenting; LFTs normalized.   #COPD/currently quit smoking not on home O2; on inhalers stable.  I spoke at length with the patient's son- regarding the patient's clinical status/plan of care.  Family agreement.  Recommended that son accompany patient to Dr. Trena Platt appointment.  Thank you  for allowing me to participate in the care of your pleasant patient. Please do not hesitate to contact me with questions or concerns in the interim.  # DISPOSITION: # CT chest ASAP # follow up TBD- Dr.B

## 2020-08-25 LAB — CANCER ANTIGEN 19-9: CA 19-9: 47 U/mL — ABNORMAL HIGH (ref 0–35)

## 2020-08-26 ENCOUNTER — Encounter: Payer: Self-pay | Admitting: Internal Medicine

## 2020-08-26 NOTE — Progress Notes (Signed)
START ON PATHWAY REGIMEN - Pancreatic Adenocarcinoma     A cycle is every 14 days:     Oxaliplatin      Leucovorin      Irinotecan      Fluorouracil   **Always confirm dose/schedule in your pharmacy ordering system**  Patient Characteristics: Preoperative (Clinical Staging), Borderline Resectable, PS = 0,1, BRCA1/2 and PALB2 Mutation Absent/Unknown Therapeutic Status: Preoperative (Clinical Staging) AJCC T Category: cT1c AJCC N Category: cN0 Resectability Status: Borderline Resectable AJCC M Category: cM0 AJCC 8 Stage Grouping: IA ECOG Performance Status: 0 BRCA1/2 Mutation Status: Quantity Not Sufficient PALB2 Mutation Status: Quantity Not Sufficient Intent of Therapy: Curative Intent, Discussed with Patient

## 2020-08-30 ENCOUNTER — Telehealth: Payer: Self-pay

## 2020-08-30 NOTE — Telephone Encounter (Signed)
Noted that Shirley Barton has been scheduled to see a Dr. Oralia Rud, medical oncology, instead of requested surgeon, Dr. Manuella Ghazi. Message sent to [facility] to inquire.

## 2020-08-31 ENCOUNTER — Telehealth: Payer: Self-pay | Admitting: Internal Medicine

## 2020-08-31 NOTE — Telephone Encounter (Signed)
On 6/01-spoke to Dr. Shella Spearing protocol CT scan: vein invasion; recommend neo-adjuvant chemo.   Shirley Barton- please refer for port placement; chemo education.   MD- June 10th- labs- cbc/cmp; ca-19-9; FOLFIRINOX; 06/13-D-3-pump off; D-3-Ziextenzo-Dr.B.

## 2020-09-01 ENCOUNTER — Telehealth: Payer: Self-pay

## 2020-09-01 ENCOUNTER — Telehealth: Payer: Self-pay | Admitting: Internal Medicine

## 2020-09-01 ENCOUNTER — Telehealth (INDEPENDENT_AMBULATORY_CARE_PROVIDER_SITE_OTHER): Payer: Self-pay

## 2020-09-01 ENCOUNTER — Other Ambulatory Visit: Payer: Self-pay

## 2020-09-01 DIAGNOSIS — C250 Malignant neoplasm of head of pancreas: Secondary | ICD-10-CM

## 2020-09-01 NOTE — Telephone Encounter (Signed)
Spoke with the patient and she is scheduled with Dr. Lucky Cowboy for a port placement on 09/08/20 with a 10:45 am arrival to the [facility]. Pre-procedure instructions were discussed and will be mailed.

## 2020-09-01 NOTE — Telephone Encounter (Signed)
On 6/02-spoke to patient regarding the plan to proceed with neoadjuvant chemotherapy; patient aware of the appointments.  Follow-up as planned

## 2020-09-01 NOTE — Telephone Encounter (Signed)
Shirley Barton are you calling pt with appts or do I need to, I believe we need to send paper over for the port placement. Just let me know.

## 2020-09-01 NOTE — Telephone Encounter (Signed)
Called and went over all upcoming appointments and what to expect at each of them. Educated on port placement and referral entered. She is aware they will call her to arrange date/time/instructions. She remains anxious and overwhelmed. She will need ongoing support throughout her journey. Encouraged her to call with any further questions or needs she may have.

## 2020-09-02 ENCOUNTER — Ambulatory Visit: Payer: Medicare (Managed Care)

## 2020-09-02 NOTE — Patient Instructions (Signed)
Irinotecan injection What is this medicine? IRINOTECAN (ir in oh TEE kan ) is a chemotherapy drug. It is used to treat colon and rectal cancer. This medicine may be used for other purposes; ask your health care provider or pharmacist if you have questions. COMMON BRAND NAME(S): Camptosar What should I tell my health care provider before I take this medicine? They need to know if you have any of these conditions:  dehydration  diarrhea  infection (especially a virus infection such as chickenpox, cold sores, or herpes)  liver disease  low blood counts, like low white cell, platelet, or red cell counts  low levels of calcium, magnesium, or potassium in the blood  recent or ongoing radiation therapy  an unusual or allergic reaction to irinotecan, other medicines, foods, dyes, or preservatives  pregnant or trying to get pregnant  breast-feeding How should I use this medicine? This drug is given as an infusion into a vein. It is administered in a hospital or clinic by a specially trained health care professional. Talk to your pediatrician regarding the use of this medicine in children. Special care may be needed. Overdosage: If you think you have taken too much of this medicine contact a poison control center or emergency room at once. NOTE: This medicine is only for you. Do not share this medicine with others. What if I miss a dose? It is important not to miss your dose. Call your doctor or health care professional if you are unable to keep an appointment. What may interact with this medicine? Do not take this medicine with any of the following medications:  cobicistat  itraconazole This medicine may interact with the following medications:  antiviral medicines for HIV or AIDS  certain antibiotics like rifampin or rifabutin  certain medicines for fungal infections like ketoconazole, posaconazole, and voriconazole  certain medicines for seizures like carbamazepine,  phenobarbital, phenotoin  clarithromycin  gemfibrozil  nefazodone  St. John's Wort This list may not describe all possible interactions. Give your health care provider a list of all the medicines, herbs, non-prescription drugs, or dietary supplements you use. Also tell them if you smoke, drink alcohol, or use illegal drugs. Some items may interact with your medicine. What should I watch for while using this medicine? Your condition will be monitored carefully while you are receiving this medicine. You will need important blood work done while you are taking this medicine. This drug may make you feel generally unwell. This is not uncommon, as chemotherapy can affect healthy cells as well as cancer cells. Report any side effects. Continue your course of treatment even though you feel ill unless your doctor tells you to stop. In some cases, you may be given additional medicines to help with side effects. Follow all directions for their use. You may get drowsy or dizzy. Do not drive, use machinery, or do anything that needs mental alertness until you know how this medicine affects you. Do not stand or sit up quickly, especially if you are an older patient. This reduces the risk of dizzy or fainting spells. Call your health care professional for advice if you get a fever, chills, or sore throat, or other symptoms of a cold or flu. Do not treat yourself. This medicine decreases your body's ability to fight infections. Try to avoid being around people who are sick. Avoid taking products that contain aspirin, acetaminophen, ibuprofen, naproxen, or ketoprofen unless instructed by your doctor. These medicines may hide a fever. This medicine may increase your risk  to bruise or bleed. Call your doctor or health care professional if you notice any unusual bleeding. Be careful brushing and flossing your teeth or using a toothpick because you may get an infection or bleed more easily. If you have any dental work  done, tell your dentist you are receiving this medicine. Do not become pregnant while taking this medicine or for 6 months after stopping it. Women should inform their health care professional if they wish to become pregnant or think they might be pregnant. Men should not father a child while taking this medicine and for 3 months after stopping it. There is potential for serious side effects to an unborn child. Talk to your health care professional for more information. Do not breast-feed an infant while taking this medicine or for 7 days after stopping it. This medicine has caused ovarian failure in some women. This medicine may make it more difficult to get pregnant. Talk to your health care professional if you are concerned about your fertility. This medicine has caused decreased sperm counts in some men. This may make it more difficult to father a child. Talk to your health care professional if you are concerned about your fertility. What side effects may I notice from receiving this medicine? Side effects that you should report to your doctor or health care professional as soon as possible:  allergic reactions like skin rash, itching or hives, swelling of the face, lips, or tongue  chest pain  diarrhea  flushing, runny nose, sweating during infusion  low blood counts - this medicine may decrease the number of white blood cells, red blood cells and platelets. You may be at increased risk for infections and bleeding.  nausea, vomiting  pain, swelling, warmth in the leg  signs of decreased platelets or bleeding - bruising, pinpoint red spots on the skin, black, tarry stools, blood in the urine  signs of infection - fever or chills, cough, sore throat, pain or difficulty passing urine  signs of decreased red blood cells - unusually weak or tired, fainting spells, lightheadedness Side effects that usually do not require medical attention (report to your doctor or health care professional  if they continue or are bothersome):  constipation  hair loss  headache  loss of appetite  mouth sores  stomach pain This list may not describe all possible side effects. Call your doctor for medical advice about side effects. You may report side effects to FDA at 1-800-FDA-1088. Where should I keep my medicine? This drug is given in a hospital or clinic and will not be stored at home. NOTE: This sheet is a summary. It may not cover all possible information. If you have questions about this medicine, talk to your doctor, pharmacist, or health care provider.  2021 [employer] Standard (2019-02-17 17:46:13) Oxaliplatin Injection What is this medicine? OXALIPLATIN (ox AL i PLA tin) is a chemotherapy drug. It targets fast dividing cells, like cancer cells, and causes these cells to die. This medicine is used to treat cancers of the colon and rectum, and many other cancers. This medicine may be used for other purposes; ask your health care provider or pharmacist if you have questions. COMMON BRAND NAME(S): Eloxatin What should I tell my health care provider before I take this medicine? They need to know if you have any of these conditions:  heart disease  history of irregular heartbeat  liver disease  low blood counts, like white cells, platelets, or red blood cells  lung or breathing disease,  like asthma  take medicines that treat or prevent blood clots  tingling of the fingers or toes, or other nerve disorder  an unusual or allergic reaction to oxaliplatin, other chemotherapy, other medicines, foods, dyes, or preservatives  pregnant or trying to get pregnant  breast-feeding How should I use this medicine? This drug is given as an infusion into a vein. It is administered in a hospital or clinic by a specially trained health care professional. Talk to your pediatrician regarding the use of this medicine in children. Special care may be needed. Overdosage: If you think  you have taken too much of this medicine contact a poison control center or emergency room at once. NOTE: This medicine is only for you. Do not share this medicine with others. What if I miss a dose? It is important not to miss a dose. Call your doctor or health care professional if you are unable to keep an appointment. What may interact with this medicine? Do not take this medicine with any of the following medications:  cisapride  dronedarone  pimozide  thioridazine This medicine may also interact with the following medications:  aspirin and aspirin-like medicines  certain medicines that treat or prevent blood clots like warfarin, apixaban, dabigatran, and rivaroxaban  cisplatin  cyclosporine  diuretics  medicines for infection like acyclovir, adefovir, amphotericin B, bacitracin, cidofovir, foscarnet, ganciclovir, gentamicin, pentamidine, vancomycin  NSAIDs, medicines for pain and inflammation, like ibuprofen or naproxen  other medicines that prolong the QT interval (an abnormal heart rhythm)  pamidronate  zoledronic acid This list may not describe all possible interactions. Give your health care provider a list of all the medicines, herbs, non-prescription drugs, or dietary supplements you use. Also tell them if you smoke, drink alcohol, or use illegal drugs. Some items may interact with your medicine. What should I watch for while using this medicine? Your condition will be monitored carefully while you are receiving this medicine. You may need blood work done while you are taking this medicine. This medicine may make you feel generally unwell. This is not uncommon as chemotherapy can affect healthy cells as well as cancer cells. Report any side effects. Continue your course of treatment even though you feel ill unless your healthcare professional tells you to stop. This medicine can make you more sensitive to cold. Do not drink cold drinks or use ice. Cover exposed skin  before coming in contact with cold temperatures or cold objects. When out in cold weather wear warm clothing and cover your mouth and nose to warm the air that goes into your lungs. Tell your doctor if you get sensitive to the cold. Do not become pregnant while taking this medicine or for 9 months after stopping it. Women should inform their health care professional if they wish to become pregnant or think they might be pregnant. Men should not father a child while taking this medicine and for 6 months after stopping it. There is potential for serious side effects to an unborn child. Talk to your health care professional for more information. Do not breast-feed a child while taking this medicine or for 3 months after stopping it. This medicine has caused ovarian failure in some women. This medicine may make it more difficult to get pregnant. Talk to your health care professional if you are concerned about your fertility. This medicine has caused decreased sperm counts in some men. This may make it more difficult to father a child. Talk to your health care professional if you  are concerned about your fertility. This medicine may increase your risk of getting an infection. Call your health care professional for advice if you get a fever, chills, or sore throat, or other symptoms of a cold or flu. Do not treat yourself. Try to avoid being around people who are sick. Avoid taking medicines that contain aspirin, acetaminophen, ibuprofen, naproxen, or ketoprofen unless instructed by your health care professional. These medicines may hide a fever. Be careful brushing or flossing your teeth or using a toothpick because you may get an infection or bleed more easily. If you have any dental work done, tell your dentist you are receiving this medicine. What side effects may I notice from receiving this medicine? Side effects that you should report to your doctor or health care professional as soon as  possible:  allergic reactions like skin rash, itching or hives, swelling of the face, lips, or tongue  breathing problems  cough  low blood counts - this medicine may decrease the number of white blood cells, red blood cells, and platelets. You may be at increased risk for infections and bleeding  nausea, vomiting  pain, redness, or irritation at site where injected  pain, tingling, numbness in the hands or feet  signs and symptoms of bleeding such as bloody or black, tarry stools; red or dark brown urine; spitting up blood or brown material that looks like coffee grounds; red spots on the skin; unusual bruising or bleeding from the eyes, gums, or nose  signs and symptoms of a dangerous change in heartbeat or heart rhythm like chest pain; dizziness; fast, irregular heartbeat; palpitations; feeling faint or lightheaded; falls  signs and symptoms of infection like fever; chills; cough; sore throat; pain or trouble passing urine  signs and symptoms of liver injury like dark yellow or brown urine; general ill feeling or flu-like symptoms; light-colored stools; loss of appetite; nausea; right upper belly pain; unusually weak or tired; yellowing of the eyes or skin  signs and symptoms of low red blood cells or anemia such as unusually weak or tired; feeling faint or lightheaded; falls  signs and symptoms of muscle injury like dark urine; trouble passing urine or change in the amount of urine; unusually weak or tired; muscle pain; back pain Side effects that usually do not require medical attention (report to your doctor or health care professional if they continue or are bothersome):  changes in taste  diarrhea  gas  hair loss  loss of appetite  mouth sores This list may not describe all possible side effects. Call your doctor for medical advice about side effects. You may report side effects to FDA at 1-800-FDA-1088. Where should I keep my medicine? This drug is given in a  hospital or clinic and will not be stored at home. NOTE: This sheet is a summary. It may not cover all possible information. If you have questions about this medicine, talk to your doctor, pharmacist, or health care provider.  2021 [employer] Standard (2018-08-06 12:20:35) Fluorouracil, 5-FU injection What is this medicine? FLUOROURACIL, 5-FU (flure oh YOOR a sil) is a chemotherapy drug. It slows the growth of cancer cells. This medicine is used to treat many types of cancer like breast cancer, colon or rectal cancer, pancreatic cancer, and stomach cancer. This medicine may be used for other purposes; ask your health care provider or pharmacist if you have questions. COMMON BRAND NAME(S): Adrucil What should I tell my health care provider before I take this medicine? They need to  know if you have any of these conditions:  blood disorders  dihydropyrimidine dehydrogenase (DPD) deficiency  infection (especially a virus infection such as chickenpox, cold sores, or herpes)  kidney disease  liver disease  malnourished, poor nutrition  recent or ongoing radiation therapy  an unusual or allergic reaction to fluorouracil, other chemotherapy, other medicines, foods, dyes, or preservatives  pregnant or trying to get pregnant  breast-feeding How should I use this medicine? This drug is given as an infusion or injection into a vein. It is administered in a hospital or clinic by a specially trained health care professional. Talk to your pediatrician regarding the use of this medicine in children. Special care may be needed. Overdosage: If you think you have taken too much of this medicine contact a poison control center or emergency room at once. NOTE: This medicine is only for you. Do not share this medicine with others. What if I miss a dose? It is important not to miss your dose. Call your doctor or health care professional if you are unable to keep an appointment. What may interact  with this medicine? Do not take this medicine with any of the following medications:  live virus vaccines This medicine may also interact with the following medications:  medicines that treat or prevent blood clots like warfarin, enoxaparin, and dalteparin This list may not describe all possible interactions. Give your health care provider a list of all the medicines, herbs, non-prescription drugs, or dietary supplements you use. Also tell them if you smoke, drink alcohol, or use illegal drugs. Some items may interact with your medicine. What should I watch for while using this medicine? Visit your doctor for checks on your progress. This drug may make you feel generally unwell. This is not uncommon, as chemotherapy can affect healthy cells as well as cancer cells. Report any side effects. Continue your course of treatment even though you feel ill unless your doctor tells you to stop. In some cases, you may be given additional medicines to help with side effects. Follow all directions for their use. Call your doctor or health care professional for advice if you get a fever, chills or sore throat, or other symptoms of a cold or flu. Do not treat yourself. This drug decreases your body's ability to fight infections. Try to avoid being around people who are sick. This medicine may increase your risk to bruise or bleed. Call your doctor or health care professional if you notice any unusual bleeding. Be careful brushing and flossing your teeth or using a toothpick because you may get an infection or bleed more easily. If you have any dental work done, tell your dentist you are receiving this medicine. Avoid taking products that contain aspirin, acetaminophen, ibuprofen, naproxen, or ketoprofen unless instructed by your doctor. These medicines may hide a fever. Do not become pregnant while taking this medicine. Women should inform their doctor if they wish to become pregnant or think they might be pregnant.  There is a potential for serious side effects to an unborn child. Talk to your health care professional or pharmacist for more information. Do not breast-feed an infant while taking this medicine. Men should inform their doctor if they wish to father a child. This medicine may lower sperm counts. Do not treat diarrhea with over the counter products. Contact your doctor if you have diarrhea that lasts more than 2 days or if it is severe and watery. This medicine can make you more sensitive to the sun.  Keep out of the sun. If you cannot avoid being in the sun, wear protective clothing and use sunscreen. Do not use sun lamps or tanning beds/booths. What side effects may I notice from receiving this medicine? Side effects that you should report to your doctor or health care professional as soon as possible:  allergic reactions like skin rash, itching or hives, swelling of the face, lips, or tongue  low blood counts - this medicine may decrease the number of white blood cells, red blood cells and platelets. You may be at increased risk for infections and bleeding.  signs of infection - fever or chills, cough, sore throat, pain or difficulty passing urine  signs of decreased platelets or bleeding - bruising, pinpoint red spots on the skin, black, tarry stools, blood in the urine  signs of decreased red blood cells - unusually weak or tired, fainting spells, lightheadedness  breathing problems  changes in vision  chest pain  mouth sores  nausea and vomiting  pain, swelling, redness at site where injected  pain, tingling, numbness in the hands or feet  redness, swelling, or sores on hands or feet  stomach pain  unusual bleeding Side effects that usually do not require medical attention (report to your doctor or health care professional if they continue or are bothersome):  changes in finger or toe nails  diarrhea  dry or itchy skin  hair loss  headache  loss of  appetite  sensitivity of eyes to the light  stomach upset  unusually teary eyes This list may not describe all possible side effects. Call your doctor for medical advice about side effects. You may report side effects to FDA at 1-800-FDA-1088. Where should I keep my medicine? This drug is given in a hospital or clinic and will not be stored at home. NOTE: This sheet is a summary. It may not cover all possible information. If you have questions about this medicine, talk to your doctor, pharmacist, or health care provider.  2021 [employer] Standard (2019-02-17 15:00:03) Leucovorin injection What is this medicine? LEUCOVORIN (loo koe VOR in) is used to prevent or treat the harmful effects of some medicines. This medicine is used to treat anemia caused by a low amount of folic acid in the body. It is also used with 5-fluorouracil (5-FU) to treat colon cancer. This medicine may be used for other purposes; ask your health care provider or pharmacist if you have questions. What should I tell my health care provider before I take this medicine? They need to know if you have any of these conditions:  anemia from low levels of vitamin B-12 in the blood  an unusual or allergic reaction to leucovorin, folic acid, other medicines, foods, dyes, or preservatives  pregnant or trying to get pregnant  breast-feeding How should I use this medicine? This medicine is for injection into a muscle or into a vein. It is given by a health care professional in a hospital or clinic setting. Talk to your pediatrician regarding the use of this medicine in children. Special care may be needed. Overdosage: If you think you have taken too much of this medicine contact a poison control center or emergency room at once. NOTE: This medicine is only for you. Do not share this medicine with others. What if I miss a dose? This does not apply. What may interact with this  medicine?  capecitabine  fluorouracil  phenobarbital  phenytoin  primidone  trimethoprim-sulfamethoxazole This list may not describe all possible interactions.  Give your health care provider a list of all the medicines, herbs, non-prescription drugs, or dietary supplements you use. Also tell them if you smoke, drink alcohol, or use illegal drugs. Some items may interact with your medicine. What should I watch for while using this medicine? Your condition will be monitored carefully while you are receiving this medicine. This medicine may increase the side effects of 5-fluorouracil, 5-FU. Tell your doctor or health care professional if you have diarrhea or mouth sores that do not get better or that get worse. What side effects may I notice from receiving this medicine? Side effects that you should report to your doctor or health care professional as soon as possible:  allergic reactions like skin rash, itching or hives, swelling of the face, lips, or tongue  breathing problems  fever, infection  mouth sores  unusual bleeding or bruising  unusually weak or tired Side effects that usually do not require medical attention (report to your doctor or health care professional if they continue or are bothersome):  constipation or diarrhea  loss of appetite  nausea, vomiting This list may not describe all possible side effects. Call your doctor for medical advice about side effects. You may report side effects to FDA at 1-800-FDA-1088. Where should I keep my medicine? This drug is given in a hospital or clinic and will not be stored at home. NOTE: This sheet is a summary. It may not cover all possible information. If you have questions about this medicine, talk to your doctor, pharmacist, or health care provider.  2021 [employer]vier/Gold Standard (2007-09-23 16:50:29)

## 2020-09-05 ENCOUNTER — Inpatient Hospital Stay: Payer: Medicare (Managed Care) | Attending: Internal Medicine

## 2020-09-05 ENCOUNTER — Other Ambulatory Visit: Payer: Self-pay

## 2020-09-05 DIAGNOSIS — I10 Essential (primary) hypertension: Secondary | ICD-10-CM | POA: Insufficient documentation

## 2020-09-05 DIAGNOSIS — Z79899 Other long term (current) drug therapy: Secondary | ICD-10-CM | POA: Insufficient documentation

## 2020-09-05 DIAGNOSIS — C250 Malignant neoplasm of head of pancreas: Secondary | ICD-10-CM | POA: Insufficient documentation

## 2020-09-05 DIAGNOSIS — Z5189 Encounter for other specified aftercare: Secondary | ICD-10-CM | POA: Insufficient documentation

## 2020-09-05 DIAGNOSIS — Z87891 Personal history of nicotine dependence: Secondary | ICD-10-CM | POA: Insufficient documentation

## 2020-09-05 DIAGNOSIS — R109 Unspecified abdominal pain: Secondary | ICD-10-CM | POA: Insufficient documentation

## 2020-09-05 DIAGNOSIS — Z5111 Encounter for antineoplastic chemotherapy: Secondary | ICD-10-CM | POA: Insufficient documentation

## 2020-09-05 DIAGNOSIS — K1230 Oral mucositis (ulcerative), unspecified: Secondary | ICD-10-CM | POA: Insufficient documentation

## 2020-09-05 DIAGNOSIS — R197 Diarrhea, unspecified: Secondary | ICD-10-CM | POA: Insufficient documentation

## 2020-09-05 DIAGNOSIS — R110 Nausea: Secondary | ICD-10-CM | POA: Insufficient documentation

## 2020-09-05 DIAGNOSIS — J449 Chronic obstructive pulmonary disease, unspecified: Secondary | ICD-10-CM | POA: Insufficient documentation

## 2020-09-05 DIAGNOSIS — Z7951 Long term (current) use of inhaled steroids: Secondary | ICD-10-CM | POA: Insufficient documentation

## 2020-09-07 ENCOUNTER — Other Ambulatory Visit: Payer: Self-pay | Admitting: *Deleted

## 2020-09-07 ENCOUNTER — Other Ambulatory Visit (INDEPENDENT_AMBULATORY_CARE_PROVIDER_SITE_OTHER): Payer: Self-pay | Admitting: Nurse Practitioner

## 2020-09-07 DIAGNOSIS — C250 Malignant neoplasm of head of pancreas: Secondary | ICD-10-CM

## 2020-09-08 ENCOUNTER — Encounter: Payer: Self-pay | Admitting: Internal Medicine

## 2020-09-08 ENCOUNTER — Ambulatory Visit
Admission: RE | Admit: 2020-09-08 | Discharge: 2020-09-08 | Disposition: A | Discharge status: Home / Self Care | Payer: Medicare (Managed Care) | Attending: Vascular Surgery | Admitting: Vascular Surgery

## 2020-09-08 ENCOUNTER — Other Ambulatory Visit: Payer: Self-pay

## 2020-09-08 ENCOUNTER — Telehealth: Payer: Self-pay

## 2020-09-08 ENCOUNTER — Inpatient Hospital Stay (HOSPITAL_BASED_OUTPATIENT_CLINIC_OR_DEPARTMENT_OTHER): Payer: Medicare (Managed Care) | Admitting: Internal Medicine

## 2020-09-08 ENCOUNTER — Inpatient Hospital Stay: Payer: Medicare (Managed Care)

## 2020-09-08 ENCOUNTER — Encounter: Payer: Self-pay | Admitting: Vascular Surgery

## 2020-09-08 ENCOUNTER — Encounter
Admission: RE | Disposition: A | Discharge status: Home / Self Care | Payer: Self-pay | Source: Home / Self Care | Attending: Vascular Surgery

## 2020-09-08 DIAGNOSIS — Z885 Allergy status to narcotic agent status: Secondary | ICD-10-CM | POA: Diagnosis not present

## 2020-09-08 DIAGNOSIS — Z880 Allergy status to penicillin: Secondary | ICD-10-CM | POA: Diagnosis not present

## 2020-09-08 DIAGNOSIS — J449 Chronic obstructive pulmonary disease, unspecified: Secondary | ICD-10-CM | POA: Diagnosis not present

## 2020-09-08 DIAGNOSIS — K1230 Oral mucositis (ulcerative), unspecified: Secondary | ICD-10-CM | POA: Diagnosis not present

## 2020-09-08 DIAGNOSIS — C250 Malignant neoplasm of head of pancreas: Secondary | ICD-10-CM | POA: Insufficient documentation

## 2020-09-08 DIAGNOSIS — Z87891 Personal history of nicotine dependence: Secondary | ICD-10-CM | POA: Insufficient documentation

## 2020-09-08 DIAGNOSIS — I10 Essential (primary) hypertension: Secondary | ICD-10-CM | POA: Diagnosis not present

## 2020-09-08 DIAGNOSIS — Z5111 Encounter for antineoplastic chemotherapy: Secondary | ICD-10-CM | POA: Diagnosis not present

## 2020-09-08 DIAGNOSIS — Z7951 Long term (current) use of inhaled steroids: Secondary | ICD-10-CM | POA: Diagnosis not present

## 2020-09-08 DIAGNOSIS — Z79899 Other long term (current) drug therapy: Secondary | ICD-10-CM | POA: Diagnosis not present

## 2020-09-08 DIAGNOSIS — R197 Diarrhea, unspecified: Secondary | ICD-10-CM | POA: Diagnosis not present

## 2020-09-08 DIAGNOSIS — Z888 Allergy status to other drugs, medicaments and biological substances status: Secondary | ICD-10-CM | POA: Insufficient documentation

## 2020-09-08 DIAGNOSIS — R109 Unspecified abdominal pain: Secondary | ICD-10-CM | POA: Diagnosis not present

## 2020-09-08 DIAGNOSIS — C259 Malignant neoplasm of pancreas, unspecified: Secondary | ICD-10-CM | POA: Diagnosis not present

## 2020-09-08 DIAGNOSIS — Z5189 Encounter for other specified aftercare: Secondary | ICD-10-CM | POA: Diagnosis not present

## 2020-09-08 DIAGNOSIS — R110 Nausea: Secondary | ICD-10-CM | POA: Diagnosis not present

## 2020-09-08 HISTORY — DX: Malignant neoplasm of pancreas, unspecified: C25.9

## 2020-09-08 HISTORY — PX: PORTA CATH INSERTION: CATH118285

## 2020-09-08 LAB — COMPREHENSIVE METABOLIC PANEL
ALT: 17 U/L (ref 0–44)
AST: 23 U/L (ref 15–41)
Albumin: 4 g/dL (ref 3.5–5.0)
Alkaline Phosphatase: 56 U/L (ref 38–126)
Anion gap: 11 (ref 5–15)
BUN: 11 mg/dL (ref 8–23)
CO2: 26 mmol/L (ref 22–32)
Calcium: 9 mg/dL (ref 8.9–10.3)
Chloride: 100 mmol/L (ref 98–111)
Creatinine, Ser: 0.61 mg/dL (ref 0.44–1.00)
GFR, Estimated: >60 mL/min (ref >60)
Glucose, Bld: 195 mg/dL — ABNORMAL HIGH (ref 70–99)
Potassium: 4 mmol/L (ref 3.5–5.1)
Sodium: 137 mmol/L (ref 135–145)
Total Bilirubin: 0.9 mg/dL (ref 0.3–1.2)
Total Protein: 7.3 g/dL (ref 6.5–8.1)

## 2020-09-08 LAB — CBC WITH DIFFERENTIAL/PLATELET
Abs Immature Granulocytes: 0.02 10*3/uL (ref 0.00–0.07)
Basophils Absolute: 0 10*3/uL (ref 0.0–0.1)
Basophils Relative: 1 %
Eosinophils Absolute: 0.1 10*3/uL (ref 0.0–0.5)
Eosinophils Relative: 1 %
HCT: 42.1 % (ref 36.0–46.0)
Hemoglobin: 13.8 g/dL (ref 12.0–15.0)
Immature Granulocytes: 0 %
Lymphocytes Relative: 25 %
Lymphs Abs: 1.6 10*3/uL (ref 0.7–4.0)
MCH: 30.2 pg (ref 26.0–34.0)
MCHC: 32.8 g/dL (ref 30.0–36.0)
MCV: 92.1 fL (ref 80.0–100.0)
Monocytes Absolute: 0.4 10*3/uL (ref 0.1–1.0)
Monocytes Relative: 7 %
Neutro Abs: 4.3 10*3/uL (ref 1.7–7.7)
Neutrophils Relative %: 66 %
Platelets: 164 10*3/uL (ref 150–400)
RBC: 4.57 MIL/uL (ref 3.87–5.11)
RDW: 12.5 % (ref 11.5–15.5)
WBC: 6.5 10*3/uL (ref 4.0–10.5)
nRBC: 0 % (ref 0.0–0.2)

## 2020-09-08 SURGERY — PORTA CATH INSERTION
Anesthesia: Moderate Sedation

## 2020-09-08 MED ORDER — LIDOCAINE-PRILOCAINE 2.5-2.5 % EX CREA: TOPICAL_CREAM | CUTANEOUS | 0 refills | Status: AC

## 2020-09-08 MED ORDER — FENTANYL CITRATE (PF) 100 MCG/2ML IJ SOLN
INTRAMUSCULAR | Status: DC | PRN
  Administered 2020-09-08 (×2): 25 ug via INTRAVENOUS

## 2020-09-08 MED ORDER — ONDANSETRON HCL 8 MG PO TABS: ORAL_TABLET | ORAL | 1 refills | Status: DC

## 2020-09-08 MED ORDER — HYDROMORPHONE HCL 1 MG/ML IJ SOLN: 1.0000 mg | Freq: Once | INTRAMUSCULAR | Status: DC | PRN

## 2020-09-08 MED ORDER — PROCHLORPERAZINE MALEATE 10 MG PO TABS: 10.0000 mg | ORAL_TABLET | Freq: Four times a day (QID) | ORAL | 1 refills | Status: DC | PRN

## 2020-09-08 MED ORDER — FENTANYL CITRATE (PF) 100 MCG/2ML IJ SOLN
INTRAMUSCULAR | Status: AC
  Filled 2020-09-08: qty 2

## 2020-09-08 MED ORDER — CLINDAMYCIN PHOSPHATE 300 MG/50ML IV SOLN
INTRAVENOUS | Status: AC
  Filled 2020-09-08: qty 50

## 2020-09-08 MED ORDER — DIPHENHYDRAMINE HCL 50 MG/ML IJ SOLN: 50.0000 mg | Freq: Once | INTRAMUSCULAR | Status: DC | PRN

## 2020-09-08 MED ORDER — MIDAZOLAM HCL 5 MG/5ML IJ SOLN
INTRAMUSCULAR | Status: AC
  Filled 2020-09-08: qty 5

## 2020-09-08 MED ORDER — MIDAZOLAM HCL 2 MG/2ML IJ SOLN
INTRAMUSCULAR | Status: DC | PRN
  Administered 2020-09-08 (×2): 1 mg via INTRAVENOUS

## 2020-09-08 MED ORDER — ONDANSETRON HCL 4 MG/2ML IJ SOLN: 4.0000 mg | Freq: Four times a day (QID) | INTRAMUSCULAR | Status: DC | PRN

## 2020-09-08 MED ORDER — CLINDAMYCIN PHOSPHATE 300 MG/50ML IV SOLN: 300.0000 mg | Freq: Once | INTRAVENOUS | Status: DC

## 2020-09-08 MED ORDER — SODIUM CHLORIDE 0.9 % IV SOLN: INTRAVENOUS | Status: DC

## 2020-09-08 MED ORDER — SODIUM CHLORIDE 0.9 % IV SOLN
Freq: Once | INTRAVENOUS | Status: DC
  Filled 2020-09-08: qty 2

## 2020-09-08 MED ORDER — METHYLPREDNISOLONE SODIUM SUCC 125 MG IJ SOLR: 125.0000 mg | Freq: Once | INTRAMUSCULAR | Status: DC | PRN

## 2020-09-08 MED ORDER — CHLORHEXIDINE GLUCONATE CLOTH 2 % EX PADS
6.0000 | MEDICATED_PAD | Freq: Every day | CUTANEOUS | Status: DC
  Administered 2020-09-08: 6 via TOPICAL

## 2020-09-08 MED ORDER — MIDAZOLAM HCL 2 MG/ML PO SYRP: 8.0000 mg | ORAL_SOLUTION | Freq: Once | ORAL | Status: DC | PRN

## 2020-09-08 MED ORDER — FAMOTIDINE 20 MG PO TABS: 40.0000 mg | ORAL_TABLET | Freq: Once | ORAL | Status: DC | PRN

## 2020-09-08 SURGICAL SUPPLY — 14 items
ADH SKN CLS APL DERMABOND .7 (GAUZE/BANDAGES/DRESSINGS) ×1
COVER PROBE U/S 5X48 (MISCELLANEOUS) ×1 IMPLANT
COVER SURGICAL LIGHT HANDLE (MISCELLANEOUS) ×1 IMPLANT
DERMABOND ADVANCED (GAUZE/BANDAGES/DRESSINGS) ×1
DERMABOND ADVANCED .7 DNX12 (GAUZE/BANDAGES/DRESSINGS) IMPLANT
HANDLE YANKAUER SUCT BULB TIP (MISCELLANEOUS) ×1 IMPLANT
KIT PORT POWER 8FR ISP CVUE (Port) ×1 IMPLANT
PACK ANGIOGRAPHY (CUSTOM PROCEDURE TRAY) ×2 IMPLANT
PENCIL ELECTRO HAND CTR (MISCELLANEOUS) ×1 IMPLANT
SPONGE XRAY 4X4 16PLY STRL (MISCELLANEOUS) ×1 IMPLANT
SUT MNCRL AB 4-0 PS2 18 (SUTURE) ×1 IMPLANT
SUT VIC AB 3-0 SH 27 (SUTURE) ×2
SUT VIC AB 3-0 SH 27X BRD (SUTURE) IMPLANT
TUBING CONNECTING 10 (TUBING) ×2 IMPLANT

## 2020-09-08 NOTE — Progress Notes (Signed)
[name] CONSULT NOTE  Patient Care Team: Erma Heritage, MD as PCP - General (Family Medicine) Rico Junker, RN as Registered Nurse Theodore Demark, RN as Registered Nurse Adron Bene as Physician Assistant (Gastroenterology) Bary Castilla Forest Gleason, MD (General Surgery) Clent Jacks, RN as Oncology Nurse Navigator  CHIEF COMPLAINTS/PURPOSE OF CONSULTATION: Pancreatic cancer  #  Oncology History Overview Note  # MAY 2022-pancreatic adenocarcinoma [obstructive jaundice-]MRI-18 mm pancreatic head mass; EUS [[facility]; Dr.Spaete]-abutment of portal vein/SMV; no lymphadenopathy noted; May 5 CA 19-9-163; s/p stenting -May 25th-47.   #Obstructive jaundice-s/p ERCP stenting [Dr.Wohl]  MAY 2022- ENDOSONOGRAPHIC FINDING: : A round mass was identified in the pancreatic head.  The mass was hypoechoic. The mass measured 16 mm by 17  mm in maximal cross-sectional diameter. The outer  margins were irregular. There was sonographic evidence  suggesting invasion into the portal vein (manifested  by abutment) and the superior mesenteric vein  (manifested by abutment).   #Pancreatic adenocarcinoma pT1 however on EUS/CT [[facility]. Dr.Shah]-concerning for tumor abutting the celiac axis /common hepatic artery /portal vein/superior mesenteric vein; without any metastatic disease.  Borderline resectable Elevated CA 19-9.  [S/p evaluation at [place]  # June 9th- FOLFIRINOX cycle #1 ---------------------------------------------------------      Malignant neoplasm of head of pancreas ([place])  08/24/2020 Initial Diagnosis   Malignant neoplasm of head of pancreas ([place])   09/09/2020 -  Chemotherapy    Patient is on Treatment Plan: PANCREAS MODIFIED FOLFIRINOX Q14D X 4 CYCLES          HISTORY OF PRESENTING ILLNESS:  Shirley Barton 70 y.o.  female with a history of COPD; chronic abdominal pain is here to proceed with neoadjuvant FOLFIRINOX chemotherapy.  In the  interim patient was evaluated at [facility] by Dr. Manuella Ghazi.  And is recommended neoadjuvant chemotherapy given the abutment of the surrounding blood vessels [see below].  Denies any worsening abdominal pain.  Has chronic abdominal pain.  Mild nausea but no vomiting.  Appetite is fair.  Review of Systems  Constitutional:  Positive for malaise/fatigue and weight loss. Negative for chills, diaphoresis and fever.  HENT:  Negative for nosebleeds and sore throat.   Eyes:  Negative for double vision.  Respiratory:  Positive for shortness of breath. Negative for cough, hemoptysis, sputum production and wheezing.   Cardiovascular:  Negative for chest pain, palpitations, orthopnea and leg swelling.  Gastrointestinal:  Positive for abdominal pain and nausea. Negative for blood in stool, constipation, diarrhea, heartburn, melena and vomiting.  Genitourinary:  Negative for dysuria, frequency and urgency.  Musculoskeletal:  Negative for back pain and joint pain.  Skin: Negative.  Negative for itching and rash.  Neurological:  Negative for dizziness, tingling, focal weakness, weakness and headaches.  Endo/Heme/Allergies:  Does not bruise/bleed easily.  Psychiatric/Behavioral:  Negative for depression. The patient is not nervous/anxious and does not have insomnia.     MEDICAL HISTORY:  Past Medical History:  Diagnosis Date   Abdominal pain, generalized    Anxiety    Atypical chest pain    Back pain    Basal cell carcinoma of skin 2013   resected from Left scalp area.    COPD (chronic obstructive pulmonary disease) (HCC)    Depression    DOE (dyspnea on exertion)    GERD (gastroesophageal reflux disease)    Hypertension    Pancreatic cancer ([place])    Shortness of breath dyspnea     SURGICAL HISTORY: Past Surgical History:  Procedure Laterality Date   BASAL CELL CARCINOMA EXCISION  July 19, 2011   CARPAL TUNNEL RELEASE Left 90s   CHOLECYSTECTOMY N/A 09/16/2015   Procedure: LAPAROSCOPIC CHOLECYSTECTOMY WITH  INTRAOPERATIVE CHOLANGIOGRAM;  Surgeon: Robert Bellow, MD;  Location: [facility] ORS;  Service: General;  Laterality: N/A;   COLONOSCOPY  1990"s   [place]   CORONARY ARTERY BYPASS GRAFT     ERCP N/A 08/05/2020   Procedure: ENDOSCOPIC RETROGRADE CHOLANGIOPANCREATOGRAPHY (ERCP);  Surgeon: Lucilla Lame, MD;  Location: [facility] ENDOSCOPY;  Service: Endoscopy;  Laterality: N/A;   ESOPHAGOGASTRODUODENOSCOPY (EGD) WITH PROPOFOL N/A 02/05/2017   Procedure: ESOPHAGOGASTRODUODENOSCOPY (EGD) WITH PROPOFOL;  Surgeon: Lollie Sails, MD;  Location: [facility] ENDOSCOPY;  Service: Endoscopy;  Laterality: N/A;   PERIPHERAL VASCULAR CATHETERIZATION Bilateral 12/27/2015   Procedure: Lower Extremity Angiography;  Surgeon: Katha Cabal, MD;  Location: [place] CV LAB;  Service: Cardiovascular;  Laterality: Bilateral;   PORTA CATH INSERTION N/A 09/08/2020   Procedure: PORTA CATH INSERTION;  Surgeon: Algernon Huxley, MD;  Location: [place] CV LAB;  Service: Cardiovascular;  Laterality: N/A;   TONSILLECTOMY      SOCIAL HISTORY: Social History   Socioeconomic History   Marital status: Divorced    Spouse name: Not on file   Number of children: Not on file   Years of education: Not on file   Highest education level: Not on file  Occupational History   Occupation: retired  Tobacco Use   Smoking status: Former    Pack years: 0.00    Types: Cigarettes    Quit date: 03/02/2012    Years since quitting: 8.5   Smokeless tobacco: Never  Vaping Use   Vaping Use: Never used  Substance and Sexual Activity   Alcohol use: No    Alcohol/week: 0.0 standard drinks   Drug use: No   Sexual activity: Not on file  Other Topics Concern   Not on file  Social History Narrative   [place]; self; smoker- quit 2011/07/19; daughter died of leukemia-2020. 2 sons [[place]; [place]]; used to be caregiver/ retd.    Social Determinants of Health   Financial Resource Strain: Not on file  Food Insecurity: Not on file   Transportation Needs: Not on file  Physical Activity: Not on file  Stress: Not on file  Social Connections: Not on file  Intimate Partner Violence: Not on file    FAMILY HISTORY: Family History  Problem Relation Age of Onset   Cerebral palsy Daughter    Colon polyps Sister    Leukemia Daughter    Depression Son    Breast cancer Neg Hx     ALLERGIES:  is allergic to tramadol, cortizone-10 [hydrocortisone], and penicillins.  MEDICATIONS:  Current Outpatient Medications  Medication Sig Dispense Refill   acetaminophen (TYLENOL) 325 MG tablet Take 650 mg by mouth daily.     b complex vitamins capsule Take 1 capsule by mouth daily.     B Complex-C-Folic Acid (B COMPLEX-VITAMIN C-FOLIC ACID) 1 MG tablet Take 1 tablet by mouth daily with breakfast. 30 tablet 0   cetirizine (ZYRTEC) 10 MG tablet Take 10 mg by mouth daily.     Fluticasone-Salmeterol (ADVAIR) 100-50 MCG/DOSE AEPB Inhale 1 puff into the lungs 2 (two) times daily.     lidocaine-prilocaine (EMLA) cream Apply 30 -45 mins prior to port access. 30 g 0   ondansetron (ZOFRAN) 8 MG tablet One pill every 8 hours as needed for nausea/vomitting. 40 tablet 1   pantoprazole (PROTONIX) 40 MG tablet Take by  mouth.     prochlorperazine (COMPAZINE) 10 MG tablet Take 1 tablet (10 mg total) by mouth every 6 (six) hours as needed for nausea or vomiting. 40 tablet 1   diphenoxylate-atropine (LOMOTIL) 2.5-0.025 MG tablet Take 1 tablet by mouth 4 (four) times daily as needed for diarrhea or loose stools. 45 tablet 0   HYDROcodone-acetaminophen (NORCO) 5-325 MG tablet Take 1 tablet by mouth every 6 (six) hours as needed for moderate pain. 45 tablet 0   magic mouthwash w/lidocaine SOLN Take 10 mLs by mouth every 2 (two) hours as needed for mouth pain. 360 mL 0   ondansetron (ZOFRAN ODT) 4 MG disintegrating tablet Take 1 tablet (4 mg total) by mouth every 6 (six) hours as needed for nausea or vomiting. (Patient not taking: Reported on 09/08/2020) 20  tablet 0   No current facility-administered medications for this visit.      Marland Kitchen  PHYSICAL EXAMINATION: ECOG PERFORMANCE STATUS: 0 - Asymptomatic  Vitals:   09/08/20 1441  BP: 129/66  Pulse: (!) 102  Temp: (!) 96.6 F (35.9 C)  SpO2: 98%   Filed Weights   09/08/20 1441  Weight: 103 lb 6.4 oz (46.9 kg)    Physical Exam Constitutional:      Comments: Caucasian female patient ambulating independently.  Alone.  HENT:     Head: Normocephalic and atraumatic.     Mouth/Throat:     Pharynx: No oropharyngeal exudate.  Eyes:     Pupils: Pupils are equal, round, and reactive to light.  Cardiovascular:     Rate and Rhythm: Normal rate and regular rhythm.  Pulmonary:     Effort: No respiratory distress.     Breath sounds: No wheezing.     Comments: Decreased air entry bilaterally.  No wheeze or crackles Abdominal:     General: Bowel sounds are normal. There is no distension.     Palpations: Abdomen is soft. There is no mass.     Tenderness: no abdominal tenderness There is no guarding or rebound.  Musculoskeletal:        General: No tenderness. Normal range of motion.     Cervical back: Normal range of motion and neck supple.  Skin:    General: Skin is warm.  Neurological:     Mental Status: She is alert and oriented to person, place, and time.  Psychiatric:        Mood and Affect: Affect normal.     LABORATORY DATA:  I have reviewed the data as listed Lab Results  Component Value Date   WBC 10.2 09/16/2020   HGB 12.9 09/16/2020   HCT 38.6 09/16/2020   MCV 91.5 09/16/2020   PLT 69 (L) 09/16/2020   Recent Labs    08/04/20 1138 08/06/20 0457 08/24/20 1435 09/08/20 1425 09/16/20 0844  NA  --    < > 138 137 134*  133*  K  --    < > 3.8 4.0 3.8  3.8  CL  --    < > 99 100 97*  97*  CO2  --    < > 27 26 26  26   GLUCOSE  --    < > 104* 195* 98  98  BUN  --    < > 13 11 9  9   CREATININE  --    < > 0.57 0.61 0.60  0.58  CALCIUM  --    < > 9.5 9.0 8.9   8.8*  GFRNONAA  --    < > >  60 >60 >60  >60  PROT  --    < > 7.9 7.3 7.0  ALBUMIN  --    < > 4.3 4.0 3.7  AST  --    < > 26 23 22   ALT  --    < > 23 17 17   ALKPHOS  --    < > 71 56 74  BILITOT  --    < > 1.0 0.9 1.0  BILIDIR 3.7*  --   --   --   --    < > = values in this interval not displayed.    RADIOGRAPHIC STUDIES: I have personally reviewed the radiological images as listed and agreed with the findings in the report. PERIPHERAL VASCULAR CATHETERIZATION  Result Date: 09/08/2020 See surgical note for result.    ASSESSMENT & PLAN:   Malignant neoplasm of head of pancreas [facility]) #Pancreatic adenocarcinoma pT1 however on EUS/CT [[facility]. Dr.Shah]-concerning for tumor abutting the celiac axis /common hepatic artery /portal vein/superior mesenteric vein; without any metastatic disease.  Borderline resectable Elevated CA 19-9.   #Proceed with neoadjuvant chemotherapy with FOLFIRINOX cycle #1 tomorrow pump for 3 days; Labs today reviewed;  acceptable for treatment today.  Again reviewed the potential side effects including but not limited to nausea vomiting diarrhea neuropathy dehydration.  Patient will get both of support.  #Obstructive jaundice-secondary to pancreatic adenocarcinoma/status post ERCP with stenting; LFTs normalized.   #COPD/currently quit smoking not on home O2; on inhalers-STABLE.   # DISPOSITION: # chemo as planned tomorrow # 1 week- labs- cbc/bmp; possible IVFs- 1 lit # follow up on June 27th- MD; labs- cbc/cmp; ca-19-9; FOLFIRINOX; 48 hours- pump-OFF/fulphila- Dr.B # follow up TBD- Dr.B  All questions were answered. The patient knows to call the clinic with any problems, questions or concerns.    Cammie Sickle, MD 09/22/2020 7:48 AM

## 2020-09-08 NOTE — Op Note (Signed)
      [facility]       Operative Note  Date: 09/08/2020  Preoperative diagnosis:  1. Pancreatic cancer  Postoperative diagnosis:  Same as above  Procedures: #1. Ultrasound guidance for vascular access to the right internal jugular vein. #2. Fluoroscopic guidance for placement of catheter. #3. Placement of CT compatible Port-A-Cath, right internal jugular vein.  Surgeon: [name], MD.   Anesthesia: Local with moderate conscious sedation for approximately 26  minutes using 2 mg of Versed and 50 mcg of Fentanyl  Fluoroscopy time: less than 1 minute  Contrast used: 0  Estimated blood loss: 5 cc  Indication for the procedure:  The patient is a 70 y.o.female with pancreatic cancer.  The patient needs a Port-A-Cath for durable venous access, chemotherapy, lab draws, and CT scans. We are asked to place this. Risks and benefits were discussed and informed consent was obtained.  Description of procedure: The patient was brought to the vascular and interventional radiology suite.  Moderate conscious sedation was administered throughout the procedure during a face to face encounter with the patient with my supervision of the RN administering medicines and monitoring the patient's vital signs, pulse oximetry, telemetry and mental status throughout from the start of the procedure until the patient was taken to the recovery room. The right neck chest and shoulder were sterilely prepped and draped, and a sterile surgical field was created. Ultrasound was used to help visualize a patent right internal jugular vein. This was then accessed under direct ultrasound guidance without difficulty with the Seldinger needle and a permanent image was recorded. A J-wire was placed. After skin nick and dilatation, the peel-away sheath was then placed over the wire. I then anesthetized an area under the clavicle approximately 1-2 fingerbreadths. A transverse incision was created and an inferior  pocket was created with electrocautery and blunt dissection. The port was then brought onto the field, placed into the pocket and secured to the chest wall with 2 Prolene sutures. The catheter was connected to the port and tunneled from the subclavicular incision to the access site. Fluoroscopic guidance was then used to cut the catheter to an appropriate length. The catheter was then placed through the peel-away sheath and the peel-away sheath was removed. The catheter tip was parked in excellent location under fluorocoscopic guidance in the SVC just above the right atrium. The pocket was then irrigated with antibiotic impregnated saline and the wound was closed with a running 3-0 Vicryl and a 4-0 Monocryl. The access incision was closed with a single 4-0 Monocryl. The Huber needle was used to withdraw blood and flush the port with heparinized saline. Dermabond was then placed as a dressing. The patient tolerated the procedure well and was taken to the recovery room in stable condition.   [name] 09/08/2020 1:05 PM   This note was created with Dragon Medical transcription system. Any errors in dictation are purely unintentional.

## 2020-09-08 NOTE — Discharge Instructions (Signed)
Implanted Port Home Guide  An implanted port is a type of central line that is placed under the skin. Central lines are used to provide IV access when treatment or nutrition needs to be given through a person's veins. Implanted ports are used for long-term IV access. An implanted port may be placed because: You need IV medicine that would be irritating to the small veins in your hands or arms. You need long-term IV medicines, such as antibiotics. You need IV nutrition for a long period. You need frequent blood draws for lab tests. You need dialysis.   Implanted ports are usually placed in the chest area, but they can also be placed in the upper arm, the abdomen, or the leg. An implanted port has two main parts: Reservoir. The reservoir is round and will appear as a small, raised area under your skin. The reservoir is the part where a needle is inserted to give medicines or draw blood. Catheter. The catheter is a thin, flexible tube that extends from the reservoir. The catheter is placed into a large vein. Medicine that is inserted into the reservoir goes into the catheter and then into the vein.   How will I care for my incision  You may shower tomorrow  How is my port accessed? Special steps must be taken to access the port: Before the port is accessed, a numbing cream can be placed on the skin. This helps numb the skin over the port site. Your health care provider uses a sterile technique to access the port. Your health care provider must put on a mask and sterile gloves. The skin over your port is cleaned carefully with an antiseptic and allowed to dry. The port is gently pinched between sterile gloves, and a needle is inserted into the port. Only "non-coring" port needles should be used to access the port. Once the port is accessed, a blood return should be checked. This helps ensure that the port is in the vein and is not clogged. If your port needs to remain accessed for a constant  infusion, a clear (transparent) bandage will be placed over the needle site. The bandage and needle will need to be changed every week, or as directed by your health care provider.   What is flushing? Flushing helps keep the port from getting clogged. Follow your health care provider's instructions on how and when to flush the port. Ports are usually flushed with saline solution or a medicine called heparin. The need for flushing will depend on how the port is used. If the port is used for intermittent medicines or blood draws, the port will need to be flushed: After medicines have been given. After blood has been drawn. As part of routine maintenance. If a constant infusion is running, the port may not need to be flushed.   How long will my port stay implanted? The port can stay in for as long as your health care provider thinks it is needed. When it is time for the port to come out, surgery will be done to remove it. The procedure is similar to the one performed when the port was put in. When should I seek immediate medical care? When you have an implanted port, you should seek immediate medical care if: You notice a bad smell coming from the incision site. You have swelling, redness, or drainage at the incision site. You have more swelling or pain at the port site or the surrounding area. You have a fever that   is not controlled with medicine.   This information is not intended to replace advice given to you by your health care provider. Make sure you discuss any questions you have with your health care provider. Document Released: 03/19/2005 Document Revised: 08/25/2015 Document Reviewed: 11/24/2012 Elsevier Interactive Patient Education  2017 Elsevier Inc.    

## 2020-09-08 NOTE — Telephone Encounter (Signed)
PA has been sent for lidocaine cream via covermymeds.

## 2020-09-08 NOTE — Assessment & Plan Note (Addendum)
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 Dr.Shah]-concerning for tumor abutting the celiac axis /common hepatic artery /portal vein/superior mesenteric vein; without any metastatic disease.  Borderline resectable Elevated CA 19-9.   #Proceed with neoadjuvant chemotherapy with FOLFIRINOX cycle #1 tomorrow pump for 3 days; Labs today reviewed;  acceptable for treatment today.  Again reviewed the potential side effects including but not limited to nausea vomiting diarrhea neuropathy dehydration.  Patient will get both of support.  #Obstructive jaundice-secondary to pancreatic adenocarcinoma/status post ERCP with stenting; LFTs normalized.   #COPD/currently quit smoking not on home O2; on inhalers-STABLE.   # DISPOSITION: # chemo as planned tomorrow # 1 week- labs- cbc/bmp; possible IVFs- 1 lit # follow up on June 27th- MD; labs- cbc/cmp; ca-19-9; FOLFIRINOX; 48 hours- pump-OFF/fulphila- Dr.B # follow up TBD- Dr.B

## 2020-09-08 NOTE — Interval H&P Note (Signed)
History and Physical Interval Note:  09/08/2020 10:57 AM  Shirley Barton  has presented today for surgery, with the diagnosis of Port Placement   Malignant neoplasm of head of pancreas.  The various methods of treatment have been discussed with the patient and family. After consideration of risks, benefits and other options for treatment, the patient has consented to  Procedure(s): PORTA CATH INSERTION (N/A) as a surgical intervention.  The patient's history has been reviewed, patient examined, no change in status, stable for surgery.  I have reviewed the patient's chart and labs.  Questions were answered to the patient's satisfaction.     [name]

## 2020-09-08 NOTE — Progress Notes (Signed)
Pt states at times her abdomen is in pain during the night. Will wake up around 3-4am with abdomen pain, per son not able to eat much when she does eat, she will vomit as soon as she's done. Not able to keep anything down. Does drink equate ([employer] brand of ensure).

## 2020-09-09 ENCOUNTER — Inpatient Hospital Stay: Payer: Medicare (Managed Care)

## 2020-09-09 ENCOUNTER — Inpatient Hospital Stay: Payer: Medicare (Managed Care) | Admitting: Internal Medicine

## 2020-09-09 ENCOUNTER — Encounter: Payer: Self-pay | Admitting: Internal Medicine

## 2020-09-09 VITALS — BP 137/76 | HR 101 | Temp 96.0°F

## 2020-09-09 DIAGNOSIS — Z5111 Encounter for antineoplastic chemotherapy: Secondary | ICD-10-CM | POA: Diagnosis not present

## 2020-09-09 DIAGNOSIS — C250 Malignant neoplasm of head of pancreas: Secondary | ICD-10-CM

## 2020-09-09 MED ORDER — ATROPINE SULFATE 1 MG/ML IJ SOLN
0.5000 mg | Freq: Once | INTRAMUSCULAR | Status: AC | PRN
  Administered 2020-09-09: 0.5 mg via INTRAVENOUS
  Filled 2020-09-09: qty 1

## 2020-09-09 MED ORDER — ATROPINE SULFATE 1 MG/ML IJ SOLN
0.5000 mg | Freq: Once | INTRAMUSCULAR | Status: AC
  Administered 2020-09-09: 0.5 mg via INTRAVENOUS

## 2020-09-09 MED ORDER — SODIUM CHLORIDE 0.9 % IV SOLN
2400.0000 mg/m2 | INTRAVENOUS | Status: DC
  Administered 2020-09-09: 3500 mg via INTRAVENOUS
  Filled 2020-09-09: qty 70

## 2020-09-09 MED ORDER — SODIUM CHLORIDE 0.9 % IV SOLN
150.0000 mg | Freq: Once | INTRAVENOUS | Status: AC
  Administered 2020-09-09: 150 mg via INTRAVENOUS
  Filled 2020-09-09: qty 150

## 2020-09-09 MED ORDER — SODIUM CHLORIDE 0.9 % IV SOLN
10.0000 mg | Freq: Once | INTRAVENOUS | Status: AC
  Administered 2020-09-09: 10 mg via INTRAVENOUS
  Filled 2020-09-09: qty 10

## 2020-09-09 MED ORDER — OXALIPLATIN CHEMO INJECTION 100 MG/20ML
85.0000 mg/m2 | Freq: Once | INTRAVENOUS | Status: AC
  Administered 2020-09-09: 125 mg via INTRAVENOUS
  Filled 2020-09-09: qty 20

## 2020-09-09 MED ORDER — PALONOSETRON HCL INJECTION 0.25 MG/5ML
0.2500 mg | Freq: Once | INTRAVENOUS | Status: AC
  Administered 2020-09-09: 0.25 mg via INTRAVENOUS
  Filled 2020-09-09: qty 5

## 2020-09-09 MED ORDER — DIPHENOXYLATE-ATROPINE 2.5-0.025 MG PO TABS: 1.0000 | ORAL_TABLET | Freq: Once | ORAL | Status: DC

## 2020-09-09 MED ORDER — SODIUM CHLORIDE 0.9 % IV SOLN
414.0000 mg/m2 | Freq: Once | INTRAVENOUS | Status: AC
  Administered 2020-09-09: 600 mg via INTRAVENOUS
  Filled 2020-09-09: qty 25

## 2020-09-09 MED ORDER — SODIUM CHLORIDE 0.9 % IV SOLN
Freq: Once | INTRAVENOUS | Status: AC
  Filled 2020-09-09: qty 250

## 2020-09-09 MED ORDER — DEXTROSE 5 % IV SOLN
Freq: Once | INTRAVENOUS | Status: AC
  Filled 2020-09-09: qty 250

## 2020-09-09 MED ORDER — SODIUM CHLORIDE 0.9 % IV SOLN
150.0000 mg/m2 | Freq: Once | INTRAVENOUS | Status: AC
  Administered 2020-09-09: 220 mg via INTRAVENOUS
  Filled 2020-09-09: qty 10

## 2020-09-09 NOTE — Progress Notes (Signed)
Nutrition Assessment   Reason for Assessment:  Patient identified on Malnutrition Screening report for weight loss   ASSESSMENT:  70 year old female with newly diagnosed pancreatic cancer.  Admitted to hospital and stent placed. Past medical history of COPD, GERD, HTN.  Patient starting folforinox today.   Met with patient during infusion.  Patient reports poor appetite since mid Feb of 2022.  Reports issues with nausea, takes zofran.  Has been eating peanut butter sandwiches, graham crackers.  Was able to tolerate [place] sandwich yesterday and burrito for dinner. Drinks equate shakes as likes them better than brand name.      Nutrition Focused Physical Exam: Orbital: mild Upper HSV:EXOG nourished Ribs: mild [place]: mild Clavicle: mild Dorsal hand: mild Patient wearing jeans unable to assess legs No edema   Medications: B complex, zofran, protonix, compazine   Labs: glucose 195   Anthropometrics:   Height: 64 inches Weight: 103 lb 6.4 oz on 6/9 UBW: 116-124 lb per patient 8-9 months ago BMI: 17  11%-17% weight loss in the last 8-9 months, signfiicant   Estimated Energy Needs  Kcals: 1400-1645 Protein: 70-82 g Fluid: 1.4 L   NUTRITION DIAGNOSIS: Inadequate oral intake related to cancer, nausea as evidenced by 11-17% weight loss in the last 8-9 months and eating less than 75% of energy needs for > 1 month   MALNUTRITION DIAGNOSIS: Patient meets criteria for severe malnutrition as evidenced by 11-17% weight loss in 8-9 months and eating less than 75% of estimated energy needs for > 1 month   INTERVENTION: Discussed strategies, foods to choose with nausea. Handout provided. Encouraged antinausea medications Encouraged equate plus shakes (350 calories) if able to tolerate Contact information provided   MONITORING, EVALUATION, GOAL: weight trends, intake   Next Visit: Monday, June 27 during infusion  Shravan Salahuddin B. Zenia Resides, [place], [place] Registered Dietitian [phone] (mobile)

## 2020-09-09 NOTE — Progress Notes (Signed)
1010: Per Dr. Rogue Bussing give Atropine post irinotecan   1300: Pt reports change in vision, states it is not blurry but is unable to describe the change. Irinotecan/Leucovorin paused. Dr. Rogue Bussing made aware, VS stable.  Per Dr. Rogue Bussing give 0.5 mg IV Atropine and wait 30 minutes and proceed with Irinotecan/Leucovorin.   1334: Vision changes improved but present and pt reports "dry mouth" Irinotecan infusion restarted per Dr. Rogue Bussing. No new orders at this time.   1455: Irinotecan finished. Pt reports vision changes still present, and "leg spasms" . Dr. Rogue Bussing aware, Per Dr. Rogue Bussing give 0.5 mg IV Atropine and wait 30 minutes prior to starting Adrucil and discharging home. VS remain stable.   1532: VS remain stable, Pt reports vision changes have not improved or worsened, leg spasms resolved.   Per Dr. Rogue Bussing proceed with Adrucil pump and discharge home. Pt educated to call clinic with any concerns or seek emergency care in the event of an emergency. Pt educated on home infusion pump and provded/educated on a chemo chemo spill kit. Patients son educated on home infusion pump. Both verbalizes understanding. Pt and VS stable at discharge.   **see flow sheets for VS and se MAR for medications**

## 2020-09-09 NOTE — Patient Instructions (Signed)
[facility]  Discharge Instructions: Thank you for choosing [facility] to provide your oncology and hematology care.  If you have a lab appointment with [facility], please go directly to the [facility] and check in at the registration area.  Wear comfortable clothing and clothing appropriate for easy access to any Portacath or PICC line.   We strive to give you quality time with your provider. You may need to reschedule your appointment if you arrive late (15 or more minutes).  Arriving late affects you and other patients whose appointments are after yours.  Also, if you miss three or more appointments without notifying the office, you may be dismissed from the clinic at the provider's discretion.      For prescription refill requests, have your pharmacy contact our office and allow 72 hours for refills to be completed.    Today you received the following chemotherapy and/or immunotherapy agents Oxaliplatin, Irinotecan, Leucovorin, and Fluorouracil      To help prevent nausea and vomiting after your treatment, we encourage you to take your nausea medication as directed.  BELOW ARE SYMPTOMS THAT SHOULD BE REPORTED IMMEDIATELY: *FEVER GREATER THAN 100.4 F (38 C) OR HIGHER *CHILLS OR SWEATING *NAUSEA AND VOMITING THAT IS NOT CONTROLLED WITH YOUR NAUSEA MEDICATION *UNUSUAL SHORTNESS OF BREATH *UNUSUAL BRUISING OR BLEEDING *URINARY PROBLEMS (pain or burning when urinating, or frequent urination) *BOWEL PROBLEMS (unusual diarrhea, constipation, pain near the anus) TENDERNESS IN MOUTH AND THROAT WITH OR WITHOUT PRESENCE OF ULCERS (sore throat, sores in mouth, or a toothache) UNUSUAL RASH, SWELLING OR PAIN  UNUSUAL VAGINAL DISCHARGE OR ITCHING   Items with * indicate a potential emergency and should be followed up as soon as possible or go to the Emergency Department if any problems should occur.  Please show the CHEMOTHERAPY ALERT  CARD or IMMUNOTHERAPY ALERT CARD at check-in to the Emergency Department and triage nurse.  Should you have questions after your visit or need to cancel or reschedule your appointment, please contact [facility]  [phone] and follow the prompts.  Office hours are 8:00 a.m. to 4:30 p.m. Monday - Friday. Please note that voicemails left after 4:00 p.m. may not be returned until the following business day.  We are closed weekends and major holidays. You have access to a nurse at all times for urgent questions. Please call the main number to the clinic [phone] and follow the prompts.  For any non-urgent questions, you may also contact your provider using MyChart. We now offer e-Visits for anyone 68 and older to request care online for non-urgent symptoms. For details visit [email or web address].   Also download the MyChart app! Go to the app store, search "MyChart", open the app, select [facility], and log in with your MyChart username and password.  Due to Covid, a mask is required upon entering the hospital/clinic. If you do not have a mask, one will be given to you upon arrival. For doctor visits, patients may have 1 support person aged 6 or older with them. For treatment visits, patients cannot have anyone with them due to current Covid guidelines and our immunocompromised population.     Oxaliplatin Injection What is this medication? OXALIPLATIN (ox AL i PLA tin) is a chemotherapy drug. It targets fast dividing cells, like cancer cells, and causes these cells to die. This medicine is usedto treat cancers of the colon and rectum, and many other cancers. This medicine may be  used for other purposes; ask your health care provider orpharmacist if you have questions. COMMON BRAND NAME(S): Eloxatin What should I tell my care team before I take this medication? They need to know if you have any of these conditions: heart disease history of irregular  heartbeat liver disease low blood counts, like white cells, platelets, or red blood cells lung or breathing disease, like asthma take medicines that treat or prevent blood clots tingling of the fingers or toes, or other nerve disorder an unusual or allergic reaction to oxaliplatin, other chemotherapy, other medicines, foods, dyes, or preservatives pregnant or trying to get pregnant breast-feeding How should I use this medication? This drug is given as an infusion into a vein. It is administered in a hospitalor clinic by a specially trained health care professional. Talk to your pediatrician regarding the use of this medicine in children.Special care may be needed. Overdosage: If you think you have taken too much of this medicine contact apoison control center or emergency room at once. NOTE: This medicine is only for you. Do not share this medicine with others. What if I miss a dose? It is important not to miss a dose. Call your doctor or health careprofessional if you are unable to keep an appointment. What may interact with this medication? Do not take this medicine with any of the following medications: cisapride dronedarone pimozide thioridazine This medicine may also interact with the following medications: aspirin and aspirin-like medicines certain medicines that treat or prevent blood clots like warfarin, apixaban, dabigatran, and rivaroxaban cisplatin cyclosporine diuretics medicines for infection like acyclovir, adefovir, amphotericin B, bacitracin, cidofovir, foscarnet, ganciclovir, gentamicin, pentamidine, vancomycin NSAIDs, medicines for pain and inflammation, like ibuprofen or naproxen other medicines that prolong the QT interval (an abnormal heart rhythm) pamidronate zoledronic acid This list may not describe all possible interactions. Give your health care provider a list of all the medicines, herbs, non-prescription drugs, or dietary supplements you use. Also tell  them if you smoke, drink alcohol, or use illegaldrugs. Some items may interact with your medicine. What should I watch for while using this medication? Your condition will be monitored carefully while you are receiving thismedicine. You may need blood work done while you are taking this medicine. This medicine may make you feel generally unwell. This is not uncommon as chemotherapy can affect healthy cells as well as cancer cells. Report any side effects. Continue your course of treatment even though you feel ill unless yourhealthcare professional tells you to stop. This medicine can make you more sensitive to cold. Do not drink cold drinks or use ice. Cover exposed skin before coming in contact with cold temperatures or cold objects. When out in cold weather wear warm clothing and cover your mouth and nose to warm the air that goes into your lungs. Tell your doctor if you getsensitive to the cold. Do not become pregnant while taking this medicine or for 9 months after stopping it. Women should inform their health care professional if they wish to become pregnant or think they might be pregnant. Men should not father a child while taking this medicine and for 6 months after stopping it. There is potential for serious side effects to an unborn child. Talk to your health careprofessional for more information. Do not breast-feed a child while taking this medicine or for 3 months afterstopping it. This medicine has caused ovarian failure in some women. This medicine may make it more difficult to get pregnant. Talk to your health care professional  if youare concerned about your fertility. This medicine has caused decreased sperm counts in some men. This may make it more difficult to father a child. Talk to your health care professional if Ventura Sellers concerned about your fertility. This medicine may increase your risk of getting an infection. Call your health care professional for advice if you get a fever, chills,  or sore throat, or other symptoms of a cold or flu. Do not treat yourself. Try to avoid beingaround people who are sick. Avoid taking medicines that contain aspirin, acetaminophen, ibuprofen, naproxen, or ketoprofen unless instructed by your health care professional.These medicines may hide a fever. Be careful brushing or flossing your teeth or using a toothpick because you may get an infection or bleed more easily. If you have any dental work done, Primary school teacher you are receiving this medicine. What side effects may I notice from receiving this medication? Side effects that you should report to your doctor or health care professionalas soon as possible: allergic reactions like skin rash, itching or hives, swelling of the face, lips, or tongue breathing problems cough low blood counts - this medicine may decrease the number of white blood cells, red blood cells, and platelets. You may be at increased risk for infections and bleeding nausea, vomiting pain, redness, or irritation at site where injected pain, tingling, numbness in the hands or feet signs and symptoms of bleeding such as bloody or black, tarry stools; red or dark brown urine; spitting up blood or brown material that looks like coffee grounds; red spots on the skin; unusual bruising or bleeding from the eyes, gums, or nose signs and symptoms of a dangerous change in heartbeat or heart rhythm like chest pain; dizziness; fast, irregular heartbeat; palpitations; feeling faint or lightheaded; falls signs and symptoms of infection like fever; chills; cough; sore throat; pain or trouble passing urine signs and symptoms of liver injury like dark yellow or brown urine; general ill feeling or flu-like symptoms; light-colored stools; loss of appetite; nausea; right upper belly pain; unusually weak or tired; yellowing of the eyes or skin signs and symptoms of low red blood cells or anemia such as unusually weak or tired; feeling faint or  lightheaded; falls signs and symptoms of muscle injury like dark urine; trouble passing urine or change in the amount of urine; unusually weak or tired; muscle pain; back pain Side effects that usually do not require medical attention (report to yourdoctor or health care professional if they continue or are bothersome): changes in taste diarrhea gas hair loss loss of appetite mouth sores This list may not describe all possible side effects. Call your doctor for medical advice about side effects. You may report side effects to FDA at1-800-FDA-1088. Where should I keep my medication? This drug is given in a hospital or clinic and will not be stored at home. NOTE: This sheet is a summary. It may not cover all possible information. If you have questions about this medicine, talk to your doctor, pharmacist, orhealth care provider.  2022 Elsevier/Gold Standard (2018-08-06 12:20:35)     Irinotecan injection What is this medication? IRINOTECAN (ir in oh TEE kan ) is a chemotherapy drug. It is used to treatcolon and rectal cancer. This medicine may be used for other purposes; ask your health care provider orpharmacist if you have questions. COMMON BRAND NAME(S): Camptosar What should I tell my care team before I take this medication? They need to know if you have any of these conditions: dehydration diarrhea infection (especially a  virus infection such as chickenpox, cold sores, or herpes) liver disease low blood counts, like low white cell, platelet, or red cell counts low levels of calcium, magnesium, or potassium in the blood recent or ongoing radiation therapy an unusual or allergic reaction to irinotecan, other medicines, foods, dyes, or preservatives pregnant or trying to get pregnant breast-feeding How should I use this medication? This drug is given as an infusion into a vein. It is administered in a hospitalor clinic by a specially trained health care professional. Talk to your  pediatrician regarding the use of this medicine in children.Special care may be needed. Overdosage: If you think you have taken too much of this medicine contact apoison control center or emergency room at once. NOTE: This medicine is only for you. Do not share this medicine with others. What if I miss a dose? It is important not to miss your dose. Call your doctor or health careprofessional if you are unable to keep an appointment. What may interact with this medication? Do not take this medicine with any of the following medications: cobicistat itraconazole This medicine may interact with the following medications: antiviral medicines for HIV or AIDS certain antibiotics like rifampin or rifabutin certain medicines for fungal infections like ketoconazole, posaconazole, and voriconazole certain medicines for seizures like carbamazepine, phenobarbital, phenotoin clarithromycin gemfibrozil nefazodone [facility]. John's Wort This list may not describe all possible interactions. Give your health care provider a list of all the medicines, herbs, non-prescription drugs, or dietary supplements you use. Also tell them if you smoke, drink alcohol, or use illegaldrugs. Some items may interact with your medicine. What should I watch for while using this medication? Your condition will be monitored carefully while you are receiving this medicine. You will need important blood work done while you are taking thismedicine. This drug may make you feel generally unwell. This is not uncommon, as chemotherapy can affect healthy cells as well as cancer cells. Report any side effects. Continue your course of treatment even though you feel ill unless yourdoctor tells you to stop. In some cases, you may be given additional medicines to help with side effects.Follow all directions for their use. You may get drowsy or dizzy. Do not drive, use machinery, or do anything that needs mental alertness until you know how this  medicine affects you. Do not stand or sit up quickly, especially if you are an older patient. This reducesthe risk of dizzy or fainting spells. Call your health care professional for advice if you get a fever, chills, or sore throat, or other symptoms of a cold or flu. Do not treat yourself. This medicine decreases your body's ability to fight infections. Try to avoid beingaround people who are sick. Avoid taking products that contain aspirin, acetaminophen, ibuprofen, naproxen, or ketoprofen unless instructed by your doctor. These medicines may hide afever. This medicine may increase your risk to bruise or bleed. Call your doctor orhealth care professional if you notice any unusual bleeding. Be careful brushing and flossing your teeth or using a toothpick because you may get an infection or bleed more easily. If you have any dental work done,tell your dentist you are receiving this medicine. Do not become pregnant while taking this medicine or for 6 months after stopping it. Women should inform their health care professional if they wish to become pregnant or think they might be pregnant. Men should not father a child while taking this medicine and for 3 months after stopping it. There is potential for serious side  effects to an unborn child. Talk to your health careprofessional for more information. Do not breast-feed an infant while taking this medicine or for 7 days afterstopping it. This medicine has caused ovarian failure in some women. This medicine may make it more difficult to get pregnant. Talk to your health care professional if Ventura Sellers concerned about your fertility. This medicine has caused decreased sperm counts in some men. This may make it more difficult to father a child. Talk to your health care professional if Ventura Sellers concerned about your fertility. What side effects may I notice from receiving this medication? Side effects that you should report to your doctor or health care  professionalas soon as possible: allergic reactions like skin rash, itching or hives, swelling of the face, lips, or tongue chest pain diarrhea flushing, runny nose, sweating during infusion low blood counts - this medicine may decrease the number of white blood cells, red blood cells and platelets. You may be at increased risk for infections and bleeding. nausea, vomiting pain, swelling, warmth in the leg signs of decreased platelets or bleeding - bruising, pinpoint red spots on the skin, black, tarry stools, blood in the urine signs of infection - fever or chills, cough, sore throat, pain or difficulty passing urine signs of decreased red blood cells - unusually weak or tired, fainting spells, lightheadedness Side effects that usually do not require medical attention (report to yourdoctor or health care professional if they continue or are bothersome): constipation hair loss headache loss of appetite mouth sores stomach pain This list may not describe all possible side effects. Call your doctor for medical advice about side effects. You may report side effects to FDA at1-800-FDA-1088. Where should I keep my medication? This drug is given in a hospital or clinic and will not be stored at home. NOTE: This sheet is a summary. It may not cover all possible information. If you have questions about this medicine, talk to your doctor, pharmacist, orhealth care provider.  2022 Elsevier/Gold Standard (2019-02-17 17:46:13)   Leucovorin injection What is this medication? LEUCOVORIN (loo koe VOR in) is used to prevent or treat the harmful effects of some medicines. This medicine is used to treat anemia caused by a low amount of folic acid in the body. It is also used with 5-fluorouracil (5-FU) to treatcolon cancer. This medicine may be used for other purposes; ask your health care provider orpharmacist if you have questions. What should I tell my care team before I take this medication? They  need to know if you have any of these conditions: anemia from low levels of vitamin B-12 in the blood an unusual or allergic reaction to leucovorin, folic acid, other medicines, foods, dyes, or preservatives pregnant or trying to get pregnant breast-feeding How should I use this medication? This medicine is for injection into a muscle or into a vein. It is given by ahealth care professional in a hospital or clinic setting. Talk to your pediatrician regarding the use of this medicine in children.Special care may be needed. Overdosage: If you think you have taken too much of this medicine contact apoison control center or emergency room at once. NOTE: This medicine is only for you. Do not share this medicine with others. What if I miss a dose? This does not apply. What may interact with this medication? capecitabine fluorouracil phenobarbital phenytoin primidone trimethoprim-sulfamethoxazole This list may not describe all possible interactions. Give your health care provider a list of all the medicines, herbs, non-prescription drugs, or dietary supplements  you use. Also tell them if you smoke, drink alcohol, or use illegaldrugs. Some items may interact with your medicine. What should I watch for while using this medication? Your condition will be monitored carefully while you are receiving thismedicine. This medicine may increase the side effects of 5-fluorouracil, 5-FU. Tell your doctor or health care professional if you have diarrhea or mouth sores that donot get better or that get worse. What side effects may I notice from receiving this medication? Side effects that you should report to your doctor or health care professionalas soon as possible: allergic reactions like skin rash, itching or hives, swelling of the face, lips, or tongue breathing problems fever, infection mouth sores unusual bleeding or bruising unusually weak or tired Side effects that usually do not require medical  attention (report to yourdoctor or health care professional if they continue or are bothersome): constipation or diarrhea loss of appetite nausea, vomiting This list may not describe all possible side effects. Call your doctor for medical advice about side effects. You may report side effects to FDA at1-800-FDA-1088. Where should I keep my medication? This drug is given in a hospital or clinic and will not be stored at home. NOTE: This sheet is a summary. It may not cover all possible information. If you have questions about this medicine, talk to your doctor, pharmacist, orhealth care provider.  2022 Elsevier/Gold Standard (2007-09-23 16:50:29)   Fluorouracil, 5-FU injection What is this medication? FLUOROURACIL, 5-FU (flure oh YOOR a sil) is a chemotherapy drug. It slows the growth of cancer cells. This medicine is used to treat many types of cancer like breast cancer, colon or rectal cancer, pancreatic cancer, and stomachcancer. This medicine may be used for other purposes; ask your health care provider orpharmacist if you have questions. COMMON BRAND NAME(S): Adrucil What should I tell my care team before I take this medication? They need to know if you have any of these conditions: blood disorders dihydropyrimidine dehydrogenase (DPD) deficiency infection (especially a virus infection such as chickenpox, cold sores, or herpes) kidney disease liver disease malnourished, poor nutrition recent or ongoing radiation therapy an unusual or allergic reaction to fluorouracil, other chemotherapy, other medicines, foods, dyes, or preservatives pregnant or trying to get pregnant breast-feeding How should I use this medication? This drug is given as an infusion or injection into a vein. It is administeredin a hospital or clinic by a specially trained health care professional. Talk to your pediatrician regarding the use of this medicine in children.Special care may be needed. Overdosage: If  you think you have taken too much of this medicine contact apoison control center or emergency room at once. NOTE: This medicine is only for you. Do not share this medicine with others. What if I miss a dose? It is important not to miss your dose. Call your doctor or health careprofessional if you are unable to keep an appointment. What may interact with this medication? Do not take this medicine with any of the following medications: live virus vaccines This medicine may also interact with the following medications: medicines that treat or prevent blood clots like warfarin, enoxaparin, and dalteparin This list may not describe all possible interactions. Give your health care provider a list of all the medicines, herbs, non-prescription drugs, or dietary supplements you use. Also tell them if you smoke, drink alcohol, or use illegaldrugs. Some items may interact with your medicine. What should I watch for while using this medication? Visit your doctor for checks on your progress.  This drug may make you feel generally unwell. This is not uncommon, as chemotherapy can affect healthy cells as well as cancer cells. Report any side effects. Continue your course oftreatment even though you feel ill unless your doctor tells you to stop. In some cases, you may be given additional medicines to help with side effects.Follow all directions for their use. Call your doctor or health care professional for advice if you get a fever, chills or sore throat, or other symptoms of a cold or flu. Do not treat yourself. This drug decreases your body's ability to fight infections. Try toavoid being around people who are sick. This medicine may increase your risk to bruise or bleed. Call your doctor orhealth care professional if you notice any unusual bleeding. Be careful brushing and flossing your teeth or using a toothpick because you may get an infection or bleed more easily. If you have any dental work done,tell your  dentist you are receiving this medicine. Avoid taking products that contain aspirin, acetaminophen, ibuprofen, naproxen, or ketoprofen unless instructed by your doctor. These medicines may hide afever. Do not become pregnant while taking this medicine. Women should inform their doctor if they wish to become pregnant or think they might be pregnant. There is a potential for serious side effects to an unborn child. Talk to your health care professional or pharmacist for more information. Do not breast-feed aninfant while taking this medicine. Men should inform their doctor if they wish to father a child. This medicinemay lower sperm counts. Do not treat diarrhea with over the counter products. Contact your doctor ifyou have diarrhea that lasts more than 2 days or if it is severe and watery. This medicine can make you more sensitive to the sun. Keep out of the sun. If you cannot avoid being in the sun, wear protective clothing and use sunscreen.Do not use sun lamps or tanning beds/booths. What side effects may I notice from receiving this medication? Side effects that you should report to your doctor or health care professionalas soon as possible: allergic reactions like skin rash, itching or hives, swelling of the face, lips, or tongue low blood counts - this medicine may decrease the number of white blood cells, red blood cells and platelets. You may be at increased risk for infections and bleeding. signs of infection - fever or chills, cough, sore throat, pain or difficulty passing urine signs of decreased platelets or bleeding - bruising, pinpoint red spots on the skin, black, tarry stools, blood in the urine signs of decreased red blood cells - unusually weak or tired, fainting spells, lightheadedness breathing problems changes in vision chest pain mouth sores nausea and vomiting pain, swelling, redness at site where injected pain, tingling, numbness in the hands or feet redness, swelling, or  sores on hands or feet stomach pain unusual bleeding Side effects that usually do not require medical attention (report to yourdoctor or health care professional if they continue or are bothersome): changes in finger or toe nails diarrhea dry or itchy skin hair loss headache loss of appetite sensitivity of eyes to the light stomach upset unusually teary eyes This list may not describe all possible side effects. Call your doctor for medical advice about side effects. You may report side effects to FDA at1-800-FDA-1088. Where should I keep my medication? This drug is given in a hospital or clinic and will not be stored at home. NOTE: This sheet is a summary. It may not cover all possible information. If you have questions  about this medicine, talk to your doctor, pharmacist, orhealth care provider.  2022 Elsevier/Gold Standard (2019-02-17 15:00:03)   Palonosetron Injection What is this medication? PALONOSETRON (pal oh NOE se tron) is used to prevent nausea and vomiting caused by chemotherapy. It also helps prevent delayed nausea and vomiting that mayoccur a few days after your treatment. This medicine may be used for other purposes; ask your health care provider orpharmacist if you have questions. COMMON BRAND NAME(S): Aloxi What should I tell my care team before I take this medication? They need to know if you have any of these conditions: an unusual or allergic reaction to palonosetron, dolasetron, granisetron, ondansetron, other medicines, foods, dyes, or preservatives pregnant or trying to get pregnant breast-feeding How should I use this medication? This medicine is for infusion into a vein. It is given by a health careprofessional in a hospital or clinic setting. Talk to your pediatrician regarding the use of this medicine in children. While this drug may be prescribed for children as young as 1 month for selectedconditions, precautions do apply. Overdosage: If you think you  have taken too much of this medicine contact apoison control center or emergency room at once. NOTE: This medicine is only for you. Do not share this medicine with others. What if I miss a dose? This does not apply. What may interact with this medication? certain medicines for depression, anxiety, or psychotic disturbances fentanyl linezolid MAOIs like Carbex, Eldepryl, Marplan, Nardil, and Parnate methylene blue (injected into a vein) tramadol This list may not describe all possible interactions. Give your health care provider a list of all the medicines, herbs, non-prescription drugs, or dietary supplements you use. Also tell them if you smoke, drink alcohol, or use illegaldrugs. Some items may interact with your medicine. What should I watch for while using this medication? Your condition will be monitored carefully while you are receiving thismedicine. What side effects may I notice from receiving this medication? Side effects that you should report to your doctor or health care professionalas soon as possible: allergic reactions like skin rash, itching or hives, swelling of the face, lips, or tongue breathing problems confusion dizziness fast, irregular heartbeat fever and chills loss of balance or coordination seizures sweating swelling of the hands and feet tremors unusually weak or tired Side effects that usually do not require medical attention (report to yourdoctor or health care professional if they continue or are bothersome): constipation or diarrhea headache This list may not describe all possible side effects. Call your doctor for medical advice about side effects. You may report side effects to FDA at1-800-FDA-1088. Where should I keep my medication? This drug is given in a hospital or clinic and will not be stored at home. NOTE: This sheet is a summary. It may not cover all possible information. If you have questions about this medicine, talk to your doctor,  pharmacist, orhealth care provider.  2022 Elsevier/Gold Standard (2013-01-23 10:38:36)   Fosaprepitant injection What is this medication? FOSAPREPITANT (fos ap RE pi tant) is used together with other medicines toprevent nausea and vomiting caused by cancer treatment (chemotherapy). This medicine may be used for other purposes; ask your health care provider orpharmacist if you have questions. COMMON BRAND NAME(S): Emend What should I tell my care team before I take this medication? They need to know if you have any of these conditions: liver disease an unusual or allergic reaction to fosaprepitant, aprepitant, medicines, foods, dyes, or preservatives pregnant or trying to get pregnant breast-feeding How should  I use this medication? This medicine is for injection into a vein. It is given by a health careprofessional in a hospital or clinic setting. Talk to your pediatrician regarding the use of this medicine in children. While this drug may be prescribed for children as young as 6 months for selectedconditions, precautions do apply. Overdosage: If you think you have taken too much of this medicine contact apoison control center or emergency room at once. NOTE: This medicine is only for you. Do not share this medicine with others. What if I miss a dose? This does not apply. What may interact with this medication? Do not take this medicine with any of these medicines: cisapride flibanserin lomitapide pimozide This medicine may also interact with the following medications: diltiazem female hormones, like estrogens or progestins and birth control pills medicines for fungal infections like ketoconazole and itraconazole medicines for HIV medicines for seizures or to control epilepsy like carbamazepine or phenytoin medicines used for sleep or anxiety disorders like alprazolam, diazepam, or midazolam nefazodone paroxetine ranolazine rifampin some chemotherapy medications like  etoposide, ifosfamide, vinblastine, vincristine some antibiotics like clarithromycin, erythromycin, troleandomycin steroid medicines like dexamethasone or methylprednisolone tolbutamide warfarin This list may not describe all possible interactions. Give your health care provider a list of all the medicines, herbs, non-prescription drugs, or dietary supplements you use. Also tell them if you smoke, drink alcohol, or use illegaldrugs. Some items may interact with your medicine. What should I watch for while using this medication? Do not take this medicine if you already have nausea and vomiting. Ask yourhealth care provider what to do if you already have nausea. Birth control pills and other methods of hormonal contraception (for example, IUD or patch) may not work properly while you are taking this medicine. Use an extra method of birth control during treatment and for 1 month after your lastdose of fosaprepitant. This medicine should not be used continuously for a long time. Visit your doctor or health care professional for regular check-ups. Thismedicine may change your liver function blood test results. What side effects may I notice from receiving this medication? Side effects that you should report to your doctor or health care professionalas soon as possible: allergic reactions like skin rash, itching or hives, swelling of the face, lips, or tongue breathing problems changes in heart rhythm high or low blood pressure pain, redness, or irritation at site where injected rectal bleeding serious dizziness or disorientation, confusion sharp or severe stomach pain sharp pain in your leg Side effects that usually do not require medical attention (report to yourdoctor or health care professional if they continue or are bothersome): constipation or diarrhea hair loss headache hiccups loss of appetite nausea upset stomach tiredness This list may not describe all possible side effects. Call  your doctor for medical advice about side effects. You may report side effects to FDA at1-800-FDA-1088. Where should I keep my medication? This drug is given in a hospital or clinic and will not be stored at home. NOTE: This sheet is a summary. It may not cover all possible information. If you have questions about this medicine, talk to your doctor, pharmacist, orhealth care provider.  2022 Elsevier/Gold Standard (2016-07-05 12:55:48)

## 2020-09-12 ENCOUNTER — Inpatient Hospital Stay: Payer: Medicare (Managed Care)

## 2020-09-12 VITALS — BP 114/76 | HR 99 | Temp 99.2°F | Resp 20

## 2020-09-12 DIAGNOSIS — C250 Malignant neoplasm of head of pancreas: Secondary | ICD-10-CM

## 2020-09-12 DIAGNOSIS — Z5111 Encounter for antineoplastic chemotherapy: Secondary | ICD-10-CM | POA: Diagnosis not present

## 2020-09-12 MED ORDER — HEPARIN SOD (PORK) LOCK FLUSH 100 UNIT/ML IV SOLN
500.0000 [IU] | Freq: Once | INTRAVENOUS | Status: AC | PRN
  Administered 2020-09-12: 500 [IU]
  Filled 2020-09-12: qty 5

## 2020-09-12 MED ORDER — HEPARIN SOD (PORK) LOCK FLUSH 100 UNIT/ML IV SOLN
INTRAVENOUS | Status: AC
  Filled 2020-09-12: qty 5

## 2020-09-12 MED ORDER — PEGFILGRASTIM-BMEZ 6 MG/0.6ML ~~LOC~~ SOSY
6.0000 mg | PREFILLED_SYRINGE | Freq: Once | SUBCUTANEOUS | Status: AC
  Administered 2020-09-12: 6 mg via SUBCUTANEOUS
  Filled 2020-09-12: qty 0.6

## 2020-09-12 MED ORDER — SODIUM CHLORIDE 0.9% FLUSH
10.0000 mL | INTRAVENOUS | Status: DC | PRN
  Administered 2020-09-12: 10 mL
  Filled 2020-09-12: qty 10

## 2020-09-13 ENCOUNTER — Telehealth: Payer: Self-pay

## 2020-09-13 NOTE — Telephone Encounter (Signed)
Telephone call to patient for follow up after receiving first infusion.   Patient states infusion went great.  States trying to eat better and drinking plenty of fluids.   Denies any nausea or vomiting.  Encouraged patient to call for any concerns or questions. Pt states started taking her Claritin yesterday and will take it for 4 days straight in a row.

## 2020-09-16 ENCOUNTER — Other Ambulatory Visit: Payer: Self-pay

## 2020-09-16 ENCOUNTER — Other Ambulatory Visit: Payer: Self-pay | Admitting: *Deleted

## 2020-09-16 ENCOUNTER — Inpatient Hospital Stay (HOSPITAL_BASED_OUTPATIENT_CLINIC_OR_DEPARTMENT_OTHER): Payer: Medicare (Managed Care) | Admitting: Hospice and Palliative Medicine

## 2020-09-16 ENCOUNTER — Inpatient Hospital Stay: Payer: Medicare (Managed Care)

## 2020-09-16 ENCOUNTER — Other Ambulatory Visit: Payer: Self-pay | Admitting: Internal Medicine

## 2020-09-16 VITALS — BP 118/83 | HR 95 | Temp 98.4°F | Resp 16

## 2020-09-16 DIAGNOSIS — C250 Malignant neoplasm of head of pancreas: Secondary | ICD-10-CM

## 2020-09-16 DIAGNOSIS — Z5111 Encounter for antineoplastic chemotherapy: Secondary | ICD-10-CM | POA: Diagnosis not present

## 2020-09-16 DIAGNOSIS — R197 Diarrhea, unspecified: Secondary | ICD-10-CM

## 2020-09-16 DIAGNOSIS — R110 Nausea: Secondary | ICD-10-CM | POA: Diagnosis not present

## 2020-09-16 LAB — CBC WITH DIFFERENTIAL/PLATELET
Abs Immature Granulocytes: 0.2 10*3/uL — ABNORMAL HIGH (ref 0.00–0.07)
Basophils Absolute: 0 10*3/uL (ref 0.0–0.1)
Basophils Relative: 0 %
Eosinophils Absolute: 0.2 10*3/uL (ref 0.0–0.5)
Eosinophils Relative: 2 %
HCT: 38.6 % (ref 36.0–46.0)
Hemoglobin: 12.9 g/dL (ref 12.0–15.0)
Immature Granulocytes: 2 %
Lymphocytes Relative: 12 %
Lymphs Abs: 1.2 10*3/uL (ref 0.7–4.0)
MCH: 30.6 pg (ref 26.0–34.0)
MCHC: 33.4 g/dL (ref 30.0–36.0)
MCV: 91.5 fL (ref 80.0–100.0)
Monocytes Absolute: 0.5 10*3/uL (ref 0.1–1.0)
Monocytes Relative: 5 %
Neutro Abs: 8.1 10*3/uL — ABNORMAL HIGH (ref 1.7–7.7)
Neutrophils Relative %: 79 %
Platelets: 69 10*3/uL — ABNORMAL LOW (ref 150–400)
RBC: 4.22 MIL/uL (ref 3.87–5.11)
RDW: 11.9 % (ref 11.5–15.5)
Smear Review: NORMAL
WBC: 10.2 10*3/uL (ref 4.0–10.5)
nRBC: 0 % (ref 0.0–0.2)

## 2020-09-16 LAB — BASIC METABOLIC PANEL
Anion gap: 10 (ref 5–15)
BUN: 9 mg/dL (ref 8–23)
CO2: 26 mmol/L (ref 22–32)
Calcium: 8.8 mg/dL — ABNORMAL LOW (ref 8.9–10.3)
Chloride: 97 mmol/L — ABNORMAL LOW (ref 98–111)
Creatinine, Ser: 0.58 mg/dL (ref 0.44–1.00)
GFR, Estimated: >60 mL/min (ref >60)
Glucose, Bld: 98 mg/dL (ref 70–99)
Potassium: 3.8 mmol/L (ref 3.5–5.1)
Sodium: 133 mmol/L — ABNORMAL LOW (ref 135–145)

## 2020-09-16 LAB — COMPREHENSIVE METABOLIC PANEL
ALT: 17 U/L (ref 0–44)
AST: 22 U/L (ref 15–41)
Albumin: 3.7 g/dL (ref 3.5–5.0)
Alkaline Phosphatase: 74 U/L (ref 38–126)
Anion gap: 11 (ref 5–15)
BUN: 9 mg/dL (ref 8–23)
CO2: 26 mmol/L (ref 22–32)
Calcium: 8.9 mg/dL (ref 8.9–10.3)
Chloride: 97 mmol/L — ABNORMAL LOW (ref 98–111)
Creatinine, Ser: 0.6 mg/dL (ref 0.44–1.00)
GFR, Estimated: >60 mL/min (ref >60)
Glucose, Bld: 98 mg/dL (ref 70–99)
Potassium: 3.8 mmol/L (ref 3.5–5.1)
Sodium: 134 mmol/L — ABNORMAL LOW (ref 135–145)
Total Bilirubin: 1 mg/dL (ref 0.3–1.2)
Total Protein: 7 g/dL (ref 6.5–8.1)

## 2020-09-16 MED ORDER — ONDANSETRON HCL 4 MG/2ML IJ SOLN
8.0000 mg | Freq: Once | INTRAMUSCULAR | Status: AC
  Administered 2020-09-16: 8 mg via INTRAVENOUS
  Filled 2020-09-16: qty 4

## 2020-09-16 MED ORDER — MAGIC MOUTHWASH W/LIDOCAINE: 10.0000 mL | ORAL | 0 refills | Status: AC | PRN

## 2020-09-16 MED ORDER — SODIUM CHLORIDE 0.9 % IV SOLN: Freq: Once | INTRAVENOUS | Status: DC

## 2020-09-16 MED ORDER — DIPHENOXYLATE-ATROPINE 2.5-0.025 MG PO TABS: 1.0000 | ORAL_TABLET | Freq: Four times a day (QID) | ORAL | 0 refills | Status: DC | PRN

## 2020-09-16 MED ORDER — HEPARIN SOD (PORK) LOCK FLUSH 100 UNIT/ML IV SOLN
500.0000 [IU] | Freq: Once | INTRAVENOUS | Status: AC | PRN
  Administered 2020-09-16: 500 [IU]
  Filled 2020-09-16: qty 5

## 2020-09-16 MED ORDER — SODIUM CHLORIDE 0.9 % IV SOLN
10.0000 mg | Freq: Once | INTRAVENOUS | Status: AC
  Administered 2020-09-16: 10 mg via INTRAVENOUS
  Filled 2020-09-16: qty 10

## 2020-09-16 MED ORDER — SODIUM CHLORIDE 0.9% FLUSH
10.0000 mL | Freq: Once | INTRAVENOUS | Status: AC | PRN
  Administered 2020-09-16: 10 mL
  Filled 2020-09-16: qty 10

## 2020-09-16 MED ORDER — HYDROCODONE-ACETAMINOPHEN 5-325 MG PO TABS: 1.0000 | ORAL_TABLET | Freq: Four times a day (QID) | ORAL | 0 refills | Status: DC | PRN

## 2020-09-16 MED ORDER — SODIUM CHLORIDE 0.9 % IV SOLN
Freq: Once | INTRAVENOUS | Status: AC
Start: 2020-09-16 — End: 2020-09-16
  Filled 2020-09-16: qty 250

## 2020-09-16 MED ORDER — HEPARIN SOD (PORK) LOCK FLUSH 100 UNIT/ML IV SOLN
INTRAVENOUS | Status: AC
  Filled 2020-09-16: qty 5

## 2020-09-16 NOTE — Progress Notes (Signed)
Symptom Management [place]  [phone] [phone]) [phone]  Patient Care Team: Shirley Heritage, MD as PCP - General (Family Medicine) Shirley Junker, RN as Registered Nurse Shirley Demark, RN as Registered Nurse Shirley Barton as Physician Assistant (Gastroenterology) Shirley Barton Shirley Gleason, MD (General Surgery) Shirley Jacks, RN as Oncology Nurse Navigator   Name of the patient: Shirley Barton  [id number]  Jun 18, 1950   Date of visit: 09/16/20  Reason for Consult: Ms. 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  She is currently on systemic treatment FOLFIRINOX and has been referred to [facility] for surgical evaluation.  There is concerned that the tumor is borderline resectable and patient is receiving aggressive neoadjuvant chemotherapy.  She has had obstructive jaundice and is status post ERCP with stenting.  Patient was added on to my clinic schedule today at Dr. Aletha Halim request to evaluate for abdominal pain.  Patient was seen in the infusion area.  Patient reports since starting chemotherapy, she has had persistent nausea and diarrhea.  She has had generalized, nonfocal abdominal pain.  She denies fever or chills.  No vomiting.  Symptoms are worse after eating.  Oral intake has been poor.  No urinary symptoms.  She has some oral sores.   Patient is taking Imodium and Zofran with little improvement.  She was tearful as she described that the chemotherapy may be "too aggressive."  Denies any neurologic complaints. Denies recent fevers or illnesses. Denies any easy bleeding or bruising. Reports good appetite and denies weight loss. Denies chest pain. Denies any nausea, vomiting, constipation, or diarrhea. Denies urinary complaints. Patient offers no further specific complaints today.  PAST MEDICAL HISTORY: Past Medical  History:  Diagnosis Date   Abdominal pain, generalized    Anxiety    Atypical chest pain    Back pain    Basal cell carcinoma of skin 2013   resected from Left scalp area.    COPD (chronic obstructive pulmonary disease) (HCC)    Depression    DOE (dyspnea on exertion)    GERD (gastroesophageal reflux disease)    Hypertension    Pancreatic cancer ([place])    Shortness of breath dyspnea     PAST SURGICAL HISTORY:  Past Surgical History:  Procedure Laterality Date   BASAL CELL CARCINOMA EXCISION  2013   CARPAL TUNNEL RELEASE Left 90s   CHOLECYSTECTOMY N/A 09/16/2015   Procedure: LAPAROSCOPIC CHOLECYSTECTOMY WITH INTRAOPERATIVE CHOLANGIOGRAM;  Surgeon: Shirley Bellow, MD;  Location: [facility] ORS;  Service: General;  Laterality: N/A;   COLONOSCOPY  1990"s   Shirley Barton   CORONARY ARTERY BYPASS GRAFT     ERCP N/A 08/05/2020   Procedure: ENDOSCOPIC RETROGRADE CHOLANGIOPANCREATOGRAPHY (ERCP);  Surgeon: Shirley Lame, MD;  Location: [facility] ENDOSCOPY;  Service: Endoscopy;  Laterality: N/A;   ESOPHAGOGASTRODUODENOSCOPY (EGD) WITH PROPOFOL N/A 02/05/2017   Procedure: ESOPHAGOGASTRODUODENOSCOPY (EGD) WITH PROPOFOL;  Surgeon: Shirley Sails, MD;  Location: [facility] ENDOSCOPY;  Service: Endoscopy;  Laterality: N/A;   PERIPHERAL VASCULAR CATHETERIZATION Bilateral 12/27/2015   Procedure: Lower Extremity Angiography;  Surgeon: Shirley Cabal, MD;  Location: [facility] CV LAB;  Service: Cardiovascular;  Laterality: Bilateral;   PORTA CATH INSERTION N/A 09/08/2020   Procedure: PORTA CATH INSERTION;  Surgeon: Shirley Huxley, MD;  Location: [place] CV LAB;  Service: Cardiovascular;  Laterality: N/A;   TONSILLECTOMY      HEMATOLOGY/ONCOLOGY HISTORY:  Oncology History  Overview Note  # MAY 2022-pancreatic adenocarcinoma [obstructive jaundice-]MRI-18 mm pancreatic head mass; EUS [[facility]; Shirley Barton]-abutment of portal vein/SMV; no lymphadenopathy noted; May 5 CA [id number]; s/p stenting -May 25th-47.    #Obstructive jaundice-s/p ERCP stenting [Shirley Barton]  MAY 2022- ENDOSONOGRAPHIC FINDING: : A round mass was identified in the pancreatic head.  The mass was hypoechoic. The mass measured 16 mm by 17  mm in maximal cross-sectional diameter. The outer  margins were irregular. There was sonographic evidence  suggesting invasion into the portal vein (manifested  by abutment) and the superior mesenteric vein  (manifested by abutment).  ---------------------------------------------------------      Malignant neoplasm of head of pancreas ([place])  08/24/2020 Initial Diagnosis   Malignant neoplasm of head of pancreas ([place])   09/09/2020 -  Chemotherapy    Patient is on Treatment Plan: PANCREAS MODIFIED FOLFIRINOX Q14D X 4 CYCLES         ALLERGIES:  is allergic to tramadol, cortizone-10 [hydrocortisone], and penicillins.  MEDICATIONS:  Current Outpatient Medications  Medication Sig Dispense Refill   acetaminophen (TYLENOL) 325 MG tablet Take 650 mg by mouth daily.     b complex vitamins capsule Take 1 capsule by mouth daily.     B Complex-C-Folic Acid (B COMPLEX-VITAMIN C-FOLIC ACID) 1 MG tablet Take 1 tablet by mouth daily with breakfast. 30 tablet 0   cetirizine (ZYRTEC) 10 MG tablet Take 10 mg by mouth daily.     Fluticasone-Salmeterol (ADVAIR) 100-50 MCG/DOSE AEPB Inhale 1 puff into the lungs 2 (two) times daily.     lidocaine-prilocaine (EMLA) cream Apply 30 -45 mins prior to port access. 30 g 0   ondansetron (ZOFRAN ODT) 4 MG disintegrating tablet Take 1 tablet (4 mg total) by mouth every 6 (six) hours as needed for nausea or vomiting. (Patient not taking: Reported on 09/08/2020) 20 tablet 0   ondansetron (ZOFRAN) 8 MG tablet One pill every 8 hours as needed for nausea/vomitting. 40 tablet 1   pantoprazole (PROTONIX) 40 MG tablet Take by mouth.     prochlorperazine (COMPAZINE) 10 MG tablet Take 1 tablet (10 mg total) by mouth every 6 (six) hours as needed for nausea or vomiting. 40  tablet 1   No current facility-administered medications for this visit.   Facility-Administered Medications Ordered in Other Visits  Medication Dose Route Frequency Provider Last Rate Last Admin   heparin lock flush 100 unit/mL  500 Units Intracatheter Once PRN Cammie Sickle, MD       sodium chloride flush (NS) 0.9 % injection 10 mL  10 mL Intracatheter Once PRN Cammie Sickle, MD        VITAL SIGNS: LMP 01/03/1995 (Approximate)  There were no vitals filed for this visit.  Estimated body mass index is 17.75 kg/m as calculated from the following:   Height as of 09/08/20: 5\' 4"  (1.626 m).   Weight as of 09/08/20: 103 lb 6.4 oz (46.9 kg).  LABS: CBC:    Component Value Date/Time   WBC 10.2 09/16/2020 0844   HGB 12.9 09/16/2020 0844   HGB 14.0 08/10/2020 1010   HCT 38.6 09/16/2020 0844   HCT 42.1 08/10/2020 1010   PLT 69 (L) 09/16/2020 0844   PLT 256 08/10/2020 1010   MCV 91.5 09/16/2020 0844   MCV 91 08/10/2020 1010   NEUTROABS PENDING 09/16/2020 0844   NEUTROABS 4.9 08/10/2020 1010   LYMPHSABS PENDING 09/16/2020 0844   LYMPHSABS 1.6 08/10/2020 1010   MONOABS PENDING 09/16/2020 0844   EOSABS PENDING 09/16/2020  0844   EOSABS 0.2 08/10/2020 1010   BASOSABS PENDING 09/16/2020 0844   BASOSABS 0.1 08/10/2020 1010   Comprehensive Metabolic Panel:    Component Value Date/Time   NA 133 (L) 09/16/2020 0844   NA 141 10/06/2015 1406   K 3.8 09/16/2020 0844   CL 97 (L) 09/16/2020 0844   CO2 26 09/16/2020 0844   BUN 9 09/16/2020 0844   BUN 7 (L) 10/06/2015 1406   CREATININE 0.58 09/16/2020 0844   GLUCOSE 98 09/16/2020 0844   CALCIUM 8.8 (L) 09/16/2020 0844   AST 23 09/08/2020 1425   ALT 17 09/08/2020 1425   ALKPHOS 56 09/08/2020 1425   BILITOT 0.9 09/08/2020 1425   BILITOT 0.5 10/06/2015 1406   PROT 7.3 09/08/2020 1425   PROT 7.5 10/06/2015 1406   ALBUMIN 4.0 09/08/2020 1425   ALBUMIN 4.7 10/06/2015 1406    RADIOGRAPHIC STUDIES: PERIPHERAL VASCULAR  CATHETERIZATION  Result Date: 09/08/2020 See surgical note for result.   PERFORMANCE STATUS (ECOG) : 1 - Symptomatic but completely ambulatory  Review of Systems Unless otherwise noted, a complete review of systems is negative.  Physical Exam General: NAD Cardiovascular: regular rate and rhythm Pulmonary: clear ant fields Abdomen: soft, nontender, + bowel sounds GU: no suprapubic tenderness Extremities: no edema, no joint deformities Skin: no rashes Neurological: Weakness but otherwise nonfocal  Assessment and Plan- Patient is a 70 y.o. female stage I pancreatic cancer on FOLFIRINOX chemotherapy who was seen today for management of abdominal pain.  Nausea/Diarrhea -abdominal exam nonfocal.  Labs are not suggestive of infectious etiology.  Most likely, symptoms are secondary to FOLFIRINOX chemotherapy.  Discussed with Dr. Rogue Bussing who will see patient on 6/27.  Patient asked if there are any other chemotherapy regimens that could be considered.  Dr. Rogue Bussing will discuss this with her.  In the interim, we will treat her supportively with IV fluids today.  We will give her dexamethasone 10 mg IV x1.  We will start on Lomotil.  Abdominal pain -nonfocal and likely due to gastroenteritis from chemotherapy.  Will start on Norco.  Discussed parameters for seeking ER care if symptoms worsening.  Discussed options for [facility] next week if needed.  Mucositis -start Magic mouthwash   Case and plan discussed with Dr. Rogue Bussing   Patient expressed understanding and was in agreement with this plan. She also understands that She can call clinic at any time with any questions, concerns, or complaints.   Thank you for allowing me to participate in the care of this very pleasant patient.   Time Total: 15 minutes  Visit consisted of counseling and education dealing with the complex and emotionally intense issues of symptom management and palliative care in the setting of serious and potentially  life-threatening illness.Greater than 50%  of this time was spent counseling and coordinating care related to the above assessment and plan.  Signed by: Altha Harm, PhD, NP-C

## 2020-09-17 ENCOUNTER — Encounter: Payer: Self-pay | Admitting: Internal Medicine

## 2020-09-22 ENCOUNTER — Encounter: Payer: Self-pay | Admitting: Internal Medicine

## 2020-09-26 ENCOUNTER — Encounter: Payer: Self-pay | Admitting: Internal Medicine

## 2020-09-26 ENCOUNTER — Other Ambulatory Visit: Payer: Self-pay

## 2020-09-26 ENCOUNTER — Inpatient Hospital Stay: Payer: Medicare (Managed Care)

## 2020-09-26 ENCOUNTER — Inpatient Hospital Stay (HOSPITAL_BASED_OUTPATIENT_CLINIC_OR_DEPARTMENT_OTHER): Payer: Medicare (Managed Care) | Admitting: Internal Medicine

## 2020-09-26 DIAGNOSIS — C250 Malignant neoplasm of head of pancreas: Secondary | ICD-10-CM | POA: Diagnosis not present

## 2020-09-26 DIAGNOSIS — Z95828 Presence of other vascular implants and grafts: Secondary | ICD-10-CM

## 2020-09-26 DIAGNOSIS — Z5111 Encounter for antineoplastic chemotherapy: Secondary | ICD-10-CM | POA: Diagnosis not present

## 2020-09-26 DIAGNOSIS — C259 Malignant neoplasm of pancreas, unspecified: Secondary | ICD-10-CM

## 2020-09-26 LAB — CBC WITH DIFFERENTIAL/PLATELET
Abs Immature Granulocytes: 0.27 10*3/uL — ABNORMAL HIGH (ref 0.00–0.07)
Basophils Absolute: 0.1 10*3/uL (ref 0.0–0.1)
Basophils Relative: 1 %
Eosinophils Absolute: 0.1 10*3/uL (ref 0.0–0.5)
Eosinophils Relative: 1 %
HCT: 37.9 % (ref 36.0–46.0)
Hemoglobin: 12.5 g/dL (ref 12.0–15.0)
Immature Granulocytes: 3 %
Lymphocytes Relative: 13 %
Lymphs Abs: 1.4 10*3/uL (ref 0.7–4.0)
MCH: 29.7 pg (ref 26.0–34.0)
MCHC: 33 g/dL (ref 30.0–36.0)
MCV: 90 fL (ref 80.0–100.0)
Monocytes Absolute: 0.9 10*3/uL (ref 0.1–1.0)
Monocytes Relative: 8 %
Neutro Abs: 8.3 10*3/uL — ABNORMAL HIGH (ref 1.7–7.7)
Neutrophils Relative %: 74 %
Platelets: 309 10*3/uL (ref 150–400)
RBC: 4.21 MIL/uL (ref 3.87–5.11)
RDW: 12.8 % (ref 11.5–15.5)
WBC: 11 10*3/uL — ABNORMAL HIGH (ref 4.0–10.5)
nRBC: 0 % (ref 0.0–0.2)

## 2020-09-26 LAB — COMPREHENSIVE METABOLIC PANEL
ALT: 122 U/L — ABNORMAL HIGH (ref 0–44)
AST: 46 U/L — ABNORMAL HIGH (ref 15–41)
Albumin: 2.9 g/dL — ABNORMAL LOW (ref 3.5–5.0)
Alkaline Phosphatase: 394 U/L — ABNORMAL HIGH (ref 38–126)
Anion gap: 10 (ref 5–15)
BUN: 6 mg/dL — ABNORMAL LOW (ref 8–23)
CO2: 30 mmol/L (ref 22–32)
Calcium: 8.7 mg/dL — ABNORMAL LOW (ref 8.9–10.3)
Chloride: 96 mmol/L — ABNORMAL LOW (ref 98–111)
Creatinine, Ser: 0.64 mg/dL (ref 0.44–1.00)
GFR, Estimated: >60 mL/min (ref >60)
Glucose, Bld: 93 mg/dL (ref 70–99)
Potassium: 3 mmol/L — ABNORMAL LOW (ref 3.5–5.1)
Sodium: 136 mmol/L (ref 135–145)
Total Bilirubin: 0.6 mg/dL (ref 0.3–1.2)
Total Protein: 6.3 g/dL — ABNORMAL LOW (ref 6.5–8.1)

## 2020-09-26 MED ORDER — HEPARIN SOD (PORK) LOCK FLUSH 100 UNIT/ML IV SOLN
500.0000 [IU] | Freq: Once | INTRAVENOUS | Status: AC
  Administered 2020-09-26: 500 [IU] via INTRAVENOUS
  Filled 2020-09-26: qty 5

## 2020-09-26 MED ORDER — SODIUM CHLORIDE 0.9% FLUSH
10.0000 mL | Freq: Once | INTRAVENOUS | Status: AC
  Administered 2020-09-26: 10 mL via INTRAVENOUS
  Filled 2020-09-26: qty 10

## 2020-09-26 MED ORDER — SODIUM CHLORIDE 0.9 % IV SOLN
Freq: Once | INTRAVENOUS | Status: AC
Start: 2020-09-26 — End: 2020-09-26
  Filled 2020-09-26: qty 250

## 2020-09-26 NOTE — Progress Notes (Signed)
Nutrition  RD came to infusion suite at schedule time for nutrition follow-up.  Patient received only IV fluids today and no treatment and session ended sooner than planned.  Patient has already left before RD arrival  Will plan follow-up at next treatment  Electra Paladino B. Zenia Resides, [place], [place] Registered Dietitian [phone] (mobile)

## 2020-09-26 NOTE — Progress Notes (Signed)
ON PATHWAY REGIMEN - Pancreatic Adenocarcinoma  No Change  Continue With Treatment as Ordered.  Original Decision Date/Time: 08/26/2020 05:50     A cycle is every 14 days:     Oxaliplatin      Leucovorin      Irinotecan      Fluorouracil   **Always confirm dose/schedule in your pharmacy ordering system**  Patient Characteristics: Preoperative (Clinical Staging), Borderline Resectable, PS = 0,1, BRCA1/2 and PALB2 Mutation Absent/Unknown Therapeutic Status: Preoperative (Clinical Staging) AJCC T Category: cT1c AJCC N Category: cN0 Resectability Status: Borderline Resectable AJCC M Category: cM0 AJCC 8 Stage Grouping: IA ECOG Performance Status: 0 BRCA1/2 Mutation Status: Quantity Not Sufficient PALB2 Mutation Status: Quantity Not Sufficient Intent of Therapy: Curative Intent, Discussed with Patient

## 2020-09-26 NOTE — Progress Notes (Signed)
DISCONTINUE ON PATHWAY REGIMEN - Pancreatic Adenocarcinoma     A cycle is every 14 days:     Oxaliplatin      Leucovorin      Irinotecan      Fluorouracil   **Always confirm dose/schedule in your pharmacy ordering system**  REASON: Toxicities / Adverse Event PRIOR TREATMENT: PANOS94: mFOLFIRINOX q14 Days x 4 Cycles TREATMENT RESPONSE: Unable to Evaluate  START ON PATHWAY REGIMEN - Pancreatic Adenocarcinoma     A cycle is every 28 days:     Nab-paclitaxel (protein bound)      Gemcitabine   **Always confirm dose/schedule in your pharmacy ordering system**  Patient Characteristics: Locally Advanced, Anatomically Unresectable, First Line, PS = 0,1, BRCA1/2 and PALB2 Mutation Absent/Unknown, Chemotherapy Therapeutic Status: Locally Advanced, Anatomically Unresectable Line of Therapy: First Line ECOG Performance Status: 1 BRCA1/2 Mutation Status: Quantity Not Sufficient PALB2 Mutation Status: Quantity Not Sufficient Intent of Therapy: Curative Intent, Discussed with Patient

## 2020-09-26 NOTE — Progress Notes (Signed)
[place] CONSULT NOTE  Patient Care Team: Erma Heritage, MD as PCP - General (Family Medicine) Rico Junker, RN as Registered Nurse Theodore Demark, RN as Registered Nurse Adron Bene as Physician Assistant (Gastroenterology) Bary Castilla Forest Gleason, MD (General Surgery) Clent Jacks, RN as Oncology Nurse Navigator  CHIEF COMPLAINTS/PURPOSE OF CONSULTATION: Pancreatic cancer  #  Oncology History Overview Note  # MAY 2022-pancreatic adenocarcinoma [obstructive jaundice-]MRI-18 mm pancreatic head mass; EUS [[facility]; Dr.Spaete]-abutment of portal vein/SMV; no lymphadenopathy noted; May 5 CA 19-9-163; s/p stenting -May 25th-47.   #Obstructive jaundice-s/p ERCP stenting [Dr.Wohl]  MAY 2022- ENDOSONOGRAPHIC FINDING: : A round mass was identified in the pancreatic head.  The mass was hypoechoic. The mass measured 16 mm by 17  mm in maximal cross-sectional diameter. The outer  margins were irregular. There was sonographic evidence  suggesting invasion into the portal vein (manifested  by abutment) and the superior mesenteric vein  (manifested by abutment).   #Pancreatic adenocarcinoma pT1 however on EUS/CT [[facility]. Dr.Shah]-concerning for tumor abutting the celiac axis /common hepatic artery /portal vein/superior mesenteric vein; without any metastatic disease.  Borderline resectable Elevated CA 19-9.  [S/p evaluation at [place]  # June 9th- FOLFIRINOX cycle #1; discontinued because of poor GI tolerance  # July 11th-gemcitabine and Abraxane day.  ---------------------------------------------------------      Malignant neoplasm of head of pancreas ([place])  08/24/2020 Initial Diagnosis   Malignant neoplasm of head of pancreas ([place])   09/09/2020 - 09/12/2020 Chemotherapy          10/10/2020 -  Chemotherapy    Patient is on Treatment Plan: PANCREATIC ABRAXANE / GEMCITABINE D1,8,15 Q28D          HISTORY OF PRESENTING ILLNESS:  Shirley Barton 70 y.o.  female with a history of COPD; chronic abdominal pain-locally advanced pancreatic cancer with involvement of the vessels s/p neoadjuvant FOLFIRINOX chemotherapy.  Patient underwent cycle 1 approximate 2 weeks ago.  However patient complained of extreme nausea vomiting diarrhea.  She needed IV fluids/antiemetics in the clinic.  She also asked extreme fatigue.  She is very concerned about continuing further chemotherapy given the side effects.  She is also anxious about her hair loss.  She feels better today.  Her appetite is still poor.  She is concerned about weight loss.  Review of Systems  Constitutional:  Positive for malaise/fatigue and weight loss. Negative for chills, diaphoresis and fever.  HENT:  Negative for nosebleeds and sore throat.   Eyes:  Negative for double vision.  Respiratory:  Positive for shortness of breath. Negative for cough, hemoptysis, sputum production and wheezing.   Cardiovascular:  Negative for chest pain, palpitations, orthopnea and leg swelling.  Gastrointestinal:  Positive for abdominal pain and nausea. Negative for blood in stool, constipation, diarrhea, heartburn, melena and vomiting.  Genitourinary:  Negative for dysuria, frequency and urgency.  Musculoskeletal:  Negative for back pain and joint pain.  Skin: Negative.  Negative for itching and rash.  Neurological:  Negative for dizziness, tingling, focal weakness, weakness and headaches.  Endo/Heme/Allergies:  Does not bruise/bleed easily.  Psychiatric/Behavioral:  Negative for depression. The patient is not nervous/anxious and does not have insomnia.     MEDICAL HISTORY:  Past Medical History:  Diagnosis Date   Abdominal pain, generalized    Anxiety    Atypical chest pain    Back pain    Basal cell carcinoma of skin 2013   resected from Left scalp area.  COPD (chronic obstructive pulmonary disease) (HCC)    Depression    DOE (dyspnea on exertion)    GERD (gastroesophageal reflux  disease)    Hypertension    Pancreatic cancer ([place])    Shortness of breath dyspnea     SURGICAL HISTORY: Past Surgical History:  Procedure Laterality Date   BASAL CELL CARCINOMA EXCISION  2011-07-09   CARPAL TUNNEL RELEASE Left 90s   CHOLECYSTECTOMY N/A 09/16/2015   Procedure: LAPAROSCOPIC CHOLECYSTECTOMY WITH INTRAOPERATIVE CHOLANGIOGRAM;  Surgeon: Robert Bellow, MD;  Location: [facility] ORS;  Service: General;  Laterality: N/A;   COLONOSCOPY  1990"s   [place]   CORONARY ARTERY BYPASS GRAFT     ERCP N/A 08/05/2020   Procedure: ENDOSCOPIC RETROGRADE CHOLANGIOPANCREATOGRAPHY (ERCP);  Surgeon: Lucilla Lame, MD;  Location: [facility] ENDOSCOPY;  Service: Endoscopy;  Laterality: N/A;   ESOPHAGOGASTRODUODENOSCOPY (EGD) WITH PROPOFOL N/A 02/05/2017   Procedure: ESOPHAGOGASTRODUODENOSCOPY (EGD) WITH PROPOFOL;  Surgeon: Lollie Sails, MD;  Location[facility] ENDOSCOPY;  Service: Endoscopy;  Laterality: N/A;   PERIPHERAL VASCULAR CATHETERIZATION Bilateral 12/27/2015   Procedure: Lower Extremity Angiography;  Surgeon: Katha Cabal, MD;  Location: [facility] CV LAB;  Service: Cardiovascular;  Laterality: Bilateral;   PORTA CATH INSERTION N/A 09/08/2020   Procedure: PORTA CATH INSERTION;  Surgeon: Algernon Huxley, MD;  Location: [facility] CV LAB;  Service: Cardiovascular;  Laterality: N/A;   TONSILLECTOMY      SOCIAL HISTORY: Social History   Socioeconomic History   Marital status: Divorced    Spouse name: Not on file   Number of children: Not on file   Years of education: Not on file   Highest education level: Not on file  Occupational History   Occupation: retired  Tobacco Use   Smoking status: Former    Pack years: 0.00    Types: Cigarettes    Quit date: 03/02/2012    Years since quitting: 8.5   Smokeless tobacco: Never  Vaping Use   Vaping Use: Never used  Substance and Sexual Activity   Alcohol use: No    Alcohol/week: 0.0 standard drinks   Drug use: No   Sexual activity: Not on  file  Other Topics Concern   Not on file  Social History Narrative   [place]; self; smoker- quit 07/09/2011; daughter died of leukemia-2020. 2 sons [[place]; [place]]; used to be caregiver/ retd.    Social Determinants of Health   Financial Resource Strain: Not on file  Food Insecurity: Not on file  Transportation Needs: Not on file  Physical Activity: Not on file  Stress: Not on file  Social Connections: Not on file  Intimate Partner Violence: Not on file    FAMILY HISTORY: Family History  Problem Relation Age of Onset   Cerebral palsy Daughter    Colon polyps Sister    Leukemia Daughter    Depression Son    Breast cancer Neg Hx     ALLERGIES:  is allergic to tramadol, cortizone-10 [hydrocortisone], and penicillins.  MEDICATIONS:  Current Outpatient Medications  Medication Sig Dispense Refill   acetaminophen (TYLENOL) 325 MG tablet Take 650 mg by mouth daily.     b complex vitamins capsule Take 1 capsule by mouth daily.     B Complex-C-Folic Acid (B COMPLEX-VITAMIN C-FOLIC ACID) 1 MG tablet Take 1 tablet by mouth daily with breakfast. 30 tablet 0   cetirizine (ZYRTEC) 10 MG tablet Take 10 mg by mouth daily.     diphenoxylate-atropine (LOMOTIL) 2.5-0.025 MG tablet Take 1 tablet by mouth  4 (four) times daily as needed for diarrhea or loose stools. 45 tablet 0   Fluticasone-Salmeterol (ADVAIR) 100-50 MCG/DOSE AEPB Inhale 1 puff into the lungs 2 (two) times daily.     HYDROcodone-acetaminophen (NORCO) 5-325 MG tablet Take 1 tablet by mouth every 6 (six) hours as needed for moderate pain. 45 tablet 0   lidocaine-prilocaine (EMLA) cream Apply 30 -45 mins prior to port access. 30 g 0   ondansetron (ZOFRAN) 8 MG tablet One pill every 8 hours as needed for nausea/vomitting. 40 tablet 1   pantoprazole (PROTONIX) 40 MG tablet Take by mouth.     prochlorperazine (COMPAZINE) 10 MG tablet Take 1 tablet (10 mg total) by mouth every 6 (six) hours as needed for nausea or vomiting. 40 tablet 1    magic mouthwash w/lidocaine SOLN Take 10 mLs by mouth every 2 (two) hours as needed for mouth pain. (Patient not taking: Reported on 09/26/2020) 360 mL 0   ondansetron (ZOFRAN ODT) 4 MG disintegrating tablet Take 1 tablet (4 mg total) by mouth every 6 (six) hours as needed for nausea or vomiting. (Patient not taking: No sig reported) 20 tablet 0   No current facility-administered medications for this visit.      Marland Kitchen  PHYSICAL EXAMINATION: ECOG PERFORMANCE STATUS: 0 - Asymptomatic  Vitals:   09/26/20 0933  BP: 124/80  Pulse: 95  Resp: 18  Temp: (!) 97.4 F (36.3 C)  SpO2: 99%   Filed Weights   09/26/20 0933  Weight: 100 lb (45.4 kg)    Physical Exam Constitutional:      Comments: Caucasian female patient ambulating independently.  Accompanied by her son.  HENT:     Head: Normocephalic and atraumatic.     Mouth/Throat:     Pharynx: No oropharyngeal exudate.  Eyes:     Pupils: Pupils are equal, round, and reactive to light.  Cardiovascular:     Rate and Rhythm: Normal rate and regular rhythm.  Pulmonary:     Effort: No respiratory distress.     Breath sounds: No wheezing.     Comments: Decreased air entry bilaterally.  No wheeze or crackles Abdominal:     General: Bowel sounds are normal. There is no distension.     Palpations: Abdomen is soft. There is no mass.     Tenderness: There is no abdominal tenderness. There is no guarding or rebound.  Musculoskeletal:        General: No tenderness. Normal range of motion.     Cervical back: Normal range of motion and neck supple.  Skin:    General: Skin is warm.  Neurological:     Mental Status: She is alert and oriented to person, place, and time.  Psychiatric:        Mood and Affect: Affect normal.     LABORATORY DATA:  I have reviewed the data as listed Lab Results  Component Value Date   WBC 11.0 (H) 09/26/2020   HGB 12.5 09/26/2020   HCT 37.9 09/26/2020   MCV 90.0 09/26/2020   PLT 309 09/26/2020   Recent  Labs    08/04/20 1138 08/06/20 0457 09/08/20 1425 09/16/20 0844 09/26/20 0921  NA  --    < > 137 134*  133* 136  K  --    < > 4.0 3.8  3.8 3.0*  CL  --    < > 100 97*  97* 96*  CO2  --    < > 26 26  26  30  GLUCOSE  --    < > 195* 98  98 93  BUN  --    < > 11 9  9  6*  CREATININE  --    < > 0.61 0.60  0.58 0.64  CALCIUM  --    < > 9.0 8.9  8.8* 8.7*  GFRNONAA  --    < > >60 >60  >60 >60  PROT  --    < > 7.3 7.0 6.3*  ALBUMIN  --    < > 4.0 3.7 2.9*  AST  --    < > 23 22 46*  ALT  --    < > 17 17 122*  ALKPHOS  --    < > 56 74 394*  BILITOT  --    < > 0.9 1.0 0.6  BILIDIR 3.7*  --   --   --   --    < > = values in this interval not displayed.    RADIOGRAPHIC STUDIES: I have personally reviewed the radiological images as listed and agreed with the findings in the report. PERIPHERAL VASCULAR CATHETERIZATION  Result Date: 09/08/2020 See surgical note for result.    ASSESSMENT & PLAN:   Malignant neoplasm of head of pancreas [facility]) #Pancreatic adenocarcinoma pT1 however on EUS/CT [[facility]. Dr.Shah]-concerning for tumor abutting the celiac axis /common hepatic artery /portal vein/superior mesenteric vein; without any metastatic disease.  Borderline resectable Elevated CA 19-9. S/p FOLFIRINOX cycle #1.  Extreme poor tolerance to chemotherapy [see below].  #I reviewed with the patient and son regarding response rates of FOLFIRINOX is about 30 to 35%; which is slightly better compared to gemcitabine Abraxane-20 to 25%.  However given extreme poor tolerance to FOLFIRINOX/patient's preference-we will switch over to gemcitabine Abraxane.  Also reviewed the recommendations from [facility].  # HOLD neoadjuvant chemotherapy with FOLFIRINOX cycle #2 tomorrow pump for 3 days; Labs today reviewed;  Plan Gem-Abraxane in 2 weeks-we will help her recover from the side effects of cycle #1.  # GI distress-nausea vomiting /diarrhea/Mucositis-- G-3-secondary chemotherapy.  Discontinue FOLFIRINOX.   Continue supportive care IV fluids/Imodium/Magic mouthwash/antiemetics as needed.  #Extreme fatigue-secondary to FOLFIRINOX chemotherapy.   # Elevated LFTs-suspect secondary to chemotherapy not biliary obstruction.  Monitor closely.  #COPD/currently quit smoking not on home O2; on inhalers-STABLE  # DISPOSITION: # HOLD chemo today # today IVFs- 1 lit # follow up in 2 weeks- MD; labs- cbc/cmp; ca-19-9;D-1 Gem-Abraxane; # in 3 weeks-labs- cbc/cmpGem-Abrxane # in 4 weeks- abs- cbc/cmp; ca-19-9;D-1 Gem-Abraxane- Dr.B  Dr.B  All questions were answered. The patient knows to call the clinic with any problems, questions or concerns.    Cammie Sickle, MD 09/26/2020 3:45 PM

## 2020-09-26 NOTE — Assessment & Plan Note (Addendum)
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 Dr.Shah]-concerning for tumor abutting the celiac axis /common hepatic artery /portal vein/superior mesenteric vein; without any metastatic disease.  Borderline resectable Elevated CA 19-9. S/p FOLFIRINOX cycle #1.  Extreme poor tolerance to chemotherapy [see below].  #I reviewed with the patient and son regarding response rates of FOLFIRINOX is about 30 to 35%; which is slightly better compared to gemcitabine Abraxane-20 to 25%.  However given extreme poor tolerance to FOLFIRINOX/patient's preference-we will switch over to gemcitabine Abraxane.  Also reviewed the recommendations from [facility].  # HOLD neoadjuvant chemotherapy with FOLFIRINOX cycle #2 tomorrow pump for 3 days; Labs today reviewed;  Plan Gem-Abraxane in 2 weeks-we will help her recover from the side effects of cycle #1.  # GI distress-nausea vomiting /diarrhea/Mucositis-- G-3-secondary chemotherapy.  Discontinue FOLFIRINOX.  Continue supportive care IV fluids/Imodium/Magic mouthwash/antiemetics as needed.  #Extreme fatigue-secondary to FOLFIRINOX chemotherapy.   # Elevated LFTs-suspect secondary to chemotherapy not biliary obstruction.  Monitor closely.  #COPD/currently quit smoking not on home O2; on inhalers-STABLE  # DISPOSITION: # HOLD chemo today # today IVFs- 1 lit # follow up in 2 weeks- MD; labs- cbc/cmp; ca-19-9;D-1 Gem-Abraxane; # in 3 weeks-labs- cbc/cmpGem-Abrxane # in 4 weeks- abs- cbc/cmp; ca-19-9;D-1 Gem-Abraxane- Dr.B

## 2020-09-26 NOTE — Progress Notes (Signed)
Wants to talk about changing treatments. States she is not taking it very well. When talking to Dr. Oralia Rud at [facility] they recommended doing 2 drug regimen due to her size. Has had bad diarrhea, bad headaches, has pain in her lower abdomen, and very fatigue.

## 2020-09-27 LAB — CANCER ANTIGEN 19-9: CA 19-9: 135 U/mL — ABNORMAL HIGH (ref 0–35)

## 2020-09-28 ENCOUNTER — Telehealth: Payer: Self-pay

## 2020-09-28 ENCOUNTER — Inpatient Hospital Stay: Payer: Medicare (Managed Care)

## 2020-09-28 NOTE — Telephone Encounter (Signed)
Nutrition Follow-up:   Patient with pancreatic cancer.  Patient received cycle 1 of folforinox.   Patient returned RD's call.  Reports that nausea is some better, taking zofran and compazine.  Has not had any diarrhea today, last episode yesterday.  Reports mouth sores but magic mouthwash is helping.  Says that she has eaten today but can't remember what.  Has been drinking some of the equate shakes but are expensive.  Knows that she needs to eat to get stronger    Medications: reviewed  Labs: reviewed  Anthropometrics:   Weight 100 lb on 6/20  103 lb on 6/9  UBW of 116-124 lb   NUTRITION DIAGNOSIS: Inadequate oral intake continues   Severe malnutrition continues   INTERVENTION:  Complimentary case of ensure plus will be left at registration desk for son to pick up tomorrow Discussed bland foods to choose with nausea Encouraged small frequent nibbles.    MONITORING, EVALUATION, GOAL: weight trends, intake   NEXT VISIT: Thursday, July 14th phone call  Meegan Shanafelt B. Zenia Resides, [place], [place] Registered Dietitian [phone] (mobile)

## 2020-09-28 NOTE — Telephone Encounter (Signed)
Nutrition  Called to follow-up with patient.  No answer.  Left message with call back number  Orenthal Debski B. Zenia Resides, [place], [place] Registered Dietitian [phone] (mobile)

## 2020-10-03 ENCOUNTER — Encounter: Payer: Self-pay | Admitting: Internal Medicine

## 2020-10-10 ENCOUNTER — Encounter: Payer: Self-pay | Admitting: Internal Medicine

## 2020-10-10 ENCOUNTER — Inpatient Hospital Stay (HOSPITAL_BASED_OUTPATIENT_CLINIC_OR_DEPARTMENT_OTHER): Payer: Medicare HMO | Admitting: Internal Medicine

## 2020-10-10 ENCOUNTER — Other Ambulatory Visit: Payer: Self-pay

## 2020-10-10 ENCOUNTER — Inpatient Hospital Stay: Payer: Medicare HMO | Attending: Internal Medicine

## 2020-10-10 ENCOUNTER — Inpatient Hospital Stay: Payer: Medicare HMO

## 2020-10-10 VITALS — BP 165/85 | HR 85 | Resp 20

## 2020-10-10 DIAGNOSIS — Z79899 Other long term (current) drug therapy: Secondary | ICD-10-CM | POA: Diagnosis not present

## 2020-10-10 DIAGNOSIS — E876 Hypokalemia: Secondary | ICD-10-CM | POA: Diagnosis not present

## 2020-10-10 DIAGNOSIS — R197 Diarrhea, unspecified: Secondary | ICD-10-CM | POA: Diagnosis not present

## 2020-10-10 DIAGNOSIS — C250 Malignant neoplasm of head of pancreas: Secondary | ICD-10-CM | POA: Diagnosis present

## 2020-10-10 DIAGNOSIS — Z95828 Presence of other vascular implants and grafts: Secondary | ICD-10-CM

## 2020-10-10 DIAGNOSIS — Z5111 Encounter for antineoplastic chemotherapy: Secondary | ICD-10-CM | POA: Diagnosis present

## 2020-10-10 LAB — CBC WITH DIFFERENTIAL/PLATELET
Abs Immature Granulocytes: 0.03 10*3/uL (ref 0.00–0.07)
Basophils Absolute: 0.1 10*3/uL (ref 0.0–0.1)
Basophils Relative: 1 %
Eosinophils Absolute: 0.3 10*3/uL (ref 0.0–0.5)
Eosinophils Relative: 3 %
HCT: 40.4 % (ref 36.0–46.0)
Hemoglobin: 12.7 g/dL (ref 12.0–15.0)
Immature Granulocytes: 0 %
Lymphocytes Relative: 23 %
Lymphs Abs: 1.8 10*3/uL (ref 0.7–4.0)
MCH: 29.3 pg (ref 26.0–34.0)
MCHC: 31.4 g/dL (ref 30.0–36.0)
MCV: 93.3 fL (ref 80.0–100.0)
Monocytes Absolute: 0.7 10*3/uL (ref 0.1–1.0)
Monocytes Relative: 9 %
Neutro Abs: 5.2 10*3/uL (ref 1.7–7.7)
Neutrophils Relative %: 64 %
Platelets: 191 10*3/uL (ref 150–400)
RBC: 4.33 MIL/uL (ref 3.87–5.11)
RDW: 14.5 % (ref 11.5–15.5)
WBC: 8 10*3/uL (ref 4.0–10.5)
nRBC: 0 % (ref 0.0–0.2)

## 2020-10-10 LAB — COMPREHENSIVE METABOLIC PANEL
ALT: 38 U/L (ref 0–44)
AST: 44 U/L — ABNORMAL HIGH (ref 15–41)
Albumin: 3.7 g/dL (ref 3.5–5.0)
Alkaline Phosphatase: 202 U/L — ABNORMAL HIGH (ref 38–126)
Anion gap: 7 (ref 5–15)
BUN: 8 mg/dL (ref 8–23)
CO2: 23 mmol/L (ref 22–32)
Calcium: 9 mg/dL (ref 8.9–10.3)
Chloride: 105 mmol/L (ref 98–111)
Creatinine, Ser: 0.52 mg/dL (ref 0.44–1.00)
GFR, Estimated: >60 mL/min (ref >60)
Glucose, Bld: 97 mg/dL (ref 70–99)
Potassium: 4.1 mmol/L (ref 3.5–5.1)
Sodium: 135 mmol/L (ref 135–145)
Total Bilirubin: 0.7 mg/dL (ref 0.3–1.2)
Total Protein: 7.5 g/dL (ref 6.5–8.1)

## 2020-10-10 MED ORDER — HEPARIN SOD (PORK) LOCK FLUSH 100 UNIT/ML IV SOLN
INTRAVENOUS | Status: AC
  Filled 2020-10-10: qty 5

## 2020-10-10 MED ORDER — HEPARIN SOD (PORK) LOCK FLUSH 100 UNIT/ML IV SOLN
500.0000 [IU] | Freq: Once | INTRAVENOUS | Status: DC
  Filled 2020-10-10: qty 5

## 2020-10-10 MED ORDER — HEPARIN SOD (PORK) LOCK FLUSH 100 UNIT/ML IV SOLN
500.0000 [IU] | Freq: Once | INTRAVENOUS | Status: AC | PRN
  Administered 2020-10-10: 500 [IU]
  Filled 2020-10-10: qty 5

## 2020-10-10 MED ORDER — SODIUM CHLORIDE 0.9 % IV SOLN
Freq: Once | INTRAVENOUS | Status: AC
  Filled 2020-10-10: qty 250

## 2020-10-10 MED ORDER — SODIUM CHLORIDE 0.9% FLUSH
10.0000 mL | Freq: Once | INTRAVENOUS | Status: AC
  Administered 2020-10-10: 10 mL via INTRAVENOUS
  Filled 2020-10-10: qty 10

## 2020-10-10 MED ORDER — DIPHENOXYLATE-ATROPINE 2.5-0.025 MG PO TABS: 1.0000 | ORAL_TABLET | Freq: Four times a day (QID) | ORAL | 1 refills | Status: DC | PRN

## 2020-10-10 MED ORDER — SODIUM CHLORIDE 0.9 % IV SOLN
1400.0000 mg | Freq: Once | INTRAVENOUS | Status: AC
  Administered 2020-10-10: 1400 mg via INTRAVENOUS
  Filled 2020-10-10: qty 26.3

## 2020-10-10 MED ORDER — PACLITAXEL PROTEIN-BOUND CHEMO INJECTION 100 MG
100.0000 mg/m2 | Freq: Once | INTRAVENOUS | Status: AC
  Administered 2020-10-10: 150 mg via INTRAVENOUS
  Filled 2020-10-10: qty 30

## 2020-10-10 MED ORDER — PROCHLORPERAZINE MALEATE 10 MG PO TABS
10.0000 mg | ORAL_TABLET | Freq: Once | ORAL | Status: AC
  Administered 2020-10-10: 10 mg via ORAL
  Filled 2020-10-10: qty 1

## 2020-10-10 NOTE — Patient Instructions (Addendum)
[facility]  Discharge Instructions: Thank you for choosing [facility] to provide your oncology and hematology care.  If you have a lab appointment with the [facility], please go directly to the [facility] and check in at the registration area.  Wear comfortable clothing and clothing appropriate for easy access to any Portacath or PICC line.   We strive to give you quality time with your provider. You may need to reschedule your appointment if you arrive late (15 or more minutes).  Arriving late affects you and other patients whose appointments are after yours.  Also, if you miss three or more appointments without notifying the office, you may be dismissed from the clinic at the provider's discretion.      For prescription refill requests, have your pharmacy contact our office and allow 72 hours for refills to be completed.    Today you received the following chemotherapy and/or immunotherapy agents Gemcitabine and PaclitaxilGemcitabine injection What is this medication? GEMCITABINE (jem SYE ta been) is a chemotherapy drug. This medicine is used to treat many types of cancer like breast cancer, lung cancer, pancreatic cancer,and ovarian cancer. This medicine may be used for other purposes; ask your health care provider orpharmacist if you have questions. COMMON BRAND NAME(S): Gemzar, Infugem What should I tell my care team before I take this medication? They need to know if you have any of these conditions: blood disorders infection kidney disease liver disease lung or breathing disease, like asthma recent or ongoing radiation therapy an unusual or allergic reaction to gemcitabine, other chemotherapy, other medicines, foods, dyes, or preservatives pregnant or trying to get pregnant breast-feeding How should I use this medication? This drug is given as an infusion into a vein. It is administered in a hospitalor clinic by a specially  trained health care professional. Talk to your pediatrician regarding the use of this medicine in children.Special care may be needed. Overdosage: If you think you have taken too much of this medicine contact apoison control center or emergency room at once. NOTE: This medicine is only for you. Do not share this medicine with others. What if I miss a dose? It is important not to miss your dose. Call your doctor or health careprofessional if you are unable to keep an appointment. What may interact with this medication? medicines to increase blood counts like filgrastim, pegfilgrastim, sargramostim some other chemotherapy drugs like cisplatin vaccines Talk to your doctor or health care professional before taking any of thesemedicines: acetaminophen aspirin ibuprofen ketoprofen naproxen This list may not describe all possible interactions. Give your health care provider a list of all the medicines, herbs, non-prescription drugs, or dietary supplements you use. Also tell them if you smoke, drink alcohol, or use illegaldrugs. Some items may interact with your medicine. What should I watch for while using this medication? Visit your doctor for checks on your progress. This drug may make you feel generally unwell. This is not uncommon, as chemotherapy can affect healthy cells as well as cancer cells. Report any side effects. Continue your course oftreatment even though you feel ill unless your doctor tells you to stop. In some cases, you may be given additional medicines to help with side effects.Follow all directions for their use. Call your doctor or health care professional for advice if you get a fever, chills or sore throat, or other symptoms of a cold or flu. Do not treat yourself. This drug decreases your body's ability to fight infections.  Try toavoid being around people who are sick. This medicine may increase your risk to bruise or bleed. Call your doctor orhealth care professional if you  notice any unusual bleeding. Be careful brushing and flossing your teeth or using a toothpick because you may get an infection or bleed more easily. If you have any dental work done,tell your dentist you are receiving this medicine. Avoid taking products that contain aspirin, acetaminophen, ibuprofen, naproxen, or ketoprofen unless instructed by your doctor. These medicines may hide afever. Do not become pregnant while taking this medicine or for 6 months after stopping it. Women should inform their doctor if they wish to become pregnant or think they might be pregnant. Men should not father a child while taking this medicine and for 3 months after stopping it. There is a potential for serious side effects to an unborn child. Talk to your health care professional or pharmacist for more information. Do not breast-feed an infant while takingthis medicine or for at least 1 week after stopping it. Men should inform their doctors if they wish to father a child. This medicine may lower sperm counts. Talk with your doctor or health care professional ifyou are concerned about your fertility. What side effects may I notice from receiving this medication? Side effects that you should report to your doctor or health care professionalas soon as possible: allergic reactions like skin rash, itching or hives, swelling of the face, lips, or tongue breathing problems pain, redness, or irritation at site where injected signs and symptoms of a dangerous change in heartbeat or heart rhythm like chest pain; dizziness; fast or irregular heartbeat; palpitations; feeling faint or lightheaded, falls; breathing problems signs of decreased platelets or bleeding - bruising, pinpoint red spots on the skin, black, tarry stools, blood in the urine signs of decreased red blood cells - unusually weak or tired, feeling faint or lightheaded, falls signs of infection - fever or chills, cough, sore throat, pain or difficulty passing  urine signs and symptoms of kidney injury like trouble passing urine or change in the amount of urine signs and symptoms of liver injury like dark yellow or brown urine; general ill feeling or flu-like symptoms; light-colored stools; loss of appetite; nausea; right upper belly pain; unusually weak or tired; yellowing of the eyes or skin swelling of ankles, feet, hands Side effects that usually do not require medical attention (report to yourdoctor or health care professional if they continue or are bothersome): constipation diarrhea hair loss loss of appetite nausea rash vomiting This list may not describe all possible side effects. Call your doctor for medical advice about side effects. You may report side effects to FDA at1-800-FDA-1088. Where should I keep my medication? This drug is given in a hospital or clinic and will not be stored at home. NOTE: This sheet is a summary. It may not cover all possible information. If you have questions about this medicine, talk to your doctor, pharmacist, orhealth care provider.  2022 Elsevier/Gold Standard (2017-06-12 18:06:11) Paclitaxel injection What is this medication? PACLITAXEL (PAK li TAX el) is a chemotherapy drug. It targets fast dividing cells, like cancer cells, and causes these cells to die. This medicine is used to treat ovarian cancer, breast cancer, lung cancer, Kaposi's sarcoma, andother cancers. This medicine may be used for other purposes; ask your health care provider orpharmacist if you have questions. COMMON BRAND NAME(S): Onxol, Taxol What should I tell my care team before I take this medication? They need to know if you  have any of these conditions: history of irregular heartbeat liver disease low blood counts, like low white cell, platelet, or red cell counts lung or breathing disease, like asthma tingling of the fingers or toes, or other nerve disorder an unusual or allergic reaction to paclitaxel, alcohol,  polyoxyethylated castor oil, other chemotherapy, other medicines, foods, dyes, or preservatives pregnant or trying to get pregnant breast-feeding How should I use this medication? This drug is given as an infusion into a vein. It is administered in a hospitalor clinic by a specially trained health care professional. Talk to your pediatrician regarding the use of this medicine in children.Special care may be needed. Overdosage: If you think you have taken too much of this medicine contact apoison control center or emergency room at once. NOTE: This medicine is only for you. Do not share this medicine with others. What if I miss a dose? It is important not to miss your dose. Call your doctor or health careprofessional if you are unable to keep an appointment. What may interact with this medication? Do not take this medicine with any of the following medications: live virus vaccines This medicine may also interact with the following medications: antiviral medicines for hepatitis, HIV or AIDS certain antibiotics like erythromycin and clarithromycin certain medicines for fungal infections like ketoconazole and itraconazole certain medicines for seizures like carbamazepine, phenobarbital, phenytoin gemfibrozil nefazodone rifampin St. John's wort This list may not describe all possible interactions. Give your health care provider a list of all the medicines, herbs, non-prescription drugs, or dietary supplements you use. Also tell them if you smoke, drink alcohol, or use illegaldrugs. Some items may interact with your medicine. What should I watch for while using this medication? Your condition will be monitored carefully while you are receiving this medicine. You will need important blood work done while you are taking thismedicine. This medicine can cause serious allergic reactions. To reduce your risk you will need to take other medicine(s) before treatment with this medicine. If you experience  allergic reactions like skin rash, itching or hives, swelling of theface, lips, or tongue, tell your doctor or health care professional right away. In some cases, you may be given additional medicines to help with side effects.Follow all directions for their use. This drug may make you feel generally unwell. This is not uncommon, as chemotherapy can affect healthy cells as well as cancer cells. Report any side effects. Continue your course of treatment even though you feel ill unless yourdoctor tells you to stop. Call your doctor or health care professional for advice if you get a fever, chills or sore throat, or other symptoms of a cold or flu. Do not treat yourself. This drug decreases your body's ability to fight infections. Try toavoid being around people who are sick. This medicine may increase your risk to bruise or bleed. Call your doctor orhealth care professional if you notice any unusual bleeding. Be careful brushing and flossing your teeth or using a toothpick because you may get an infection or bleed more easily. If you have any dental work done,tell your dentist you are receiving this medicine. Avoid taking products that contain aspirin, acetaminophen, ibuprofen, naproxen, or ketoprofen unless instructed by your doctor. These medicines may hide afever. Do not become pregnant while taking this medicine. Women should inform their doctor if they wish to become pregnant or think they might be pregnant. There is a potential for serious side effects to an unborn child. Talk to your health care professional or pharmacist  for more information. Do not breast-feed aninfant while taking this medicine. Men are advised not to father a child while receiving this medicine. This product may contain alcohol. Ask your pharmacist or healthcare provider if this medicine contains alcohol. Be sure to tell all healthcare providers you are taking this medicine. Certain medicines, like metronidazole and disulfiram, can  cause an unpleasant reaction when taken with alcohol. The reaction includes flushing, headache, nausea, vomiting, sweating, and increased thirst. Thereaction can last from 30 minutes to several hours. What side effects may I notice from receiving this medication? Side effects that you should report to your doctor or health care professionalas soon as possible: allergic reactions like skin rash, itching or hives, swelling of the face, lips, or tongue breathing problems changes in vision fast, irregular heartbeat high or low blood pressure mouth sores pain, tingling, numbness in the hands or feet signs of decreased platelets or bleeding - bruising, pinpoint red spots on the skin, black, tarry stools, blood in the urine signs of decreased red blood cells - unusually weak or tired, feeling faint or lightheaded, falls signs of infection - fever or chills, cough, sore throat, pain or difficulty passing urine signs and symptoms of liver injury like dark yellow or brown urine; general ill feeling or flu-like symptoms; light-colored stools; loss of appetite; nausea; right upper belly pain; unusually weak or tired; yellowing of the eyes or skin swelling of the ankles, feet, hands unusually slow heartbeat Side effects that usually do not require medical attention (report to yourdoctor or health care professional if they continue or are bothersome): diarrhea hair loss loss of appetite muscle or joint pain nausea, vomiting pain, redness, or irritation at site where injected tiredness This list may not describe all possible side effects. Call your doctor for medical advice about side effects. You may report side effects to FDA at1-800-FDA-1088. Where should I keep my medication? This drug is given in a hospital or clinic and will not be stored at home. NOTE: This sheet is a summary. It may not cover all possible information. If you have questions about this medicine, talk to your doctor, pharmacist,  orhealth care provider.  2022 Elsevier/Gold Standard (2019-02-18 13:37:23)    To help prevent nausea and vomiting after your treatment, we encourage you to take your nausea medication as directed.  BELOW ARE SYMPTOMS THAT SHOULD BE REPORTED IMMEDIATELY: *FEVER GREATER THAN 100.4 F (38 C) OR HIGHER *CHILLS OR SWEATING *NAUSEA AND VOMITING THAT IS NOT CONTROLLED WITH YOUR NAUSEA MEDICATION *UNUSUAL SHORTNESS OF BREATH *UNUSUAL BRUISING OR BLEEDING *URINARY PROBLEMS (pain or burning when urinating, or frequent urination) *BOWEL PROBLEMS (unusual diarrhea, constipation, pain near the anus) TENDERNESS IN MOUTH AND THROAT WITH OR WITHOUT PRESENCE OF ULCERS (sore throat, sores in mouth, or a toothache) UNUSUAL RASH, SWELLING OR PAIN  UNUSUAL VAGINAL DISCHARGE OR ITCHING   Items with * indicate a potential emergency and should be followed up as soon as possible or go to the Emergency Department if any problems should occur.  Please show the CHEMOTHERAPY ALERT CARD or IMMUNOTHERAPY ALERT CARD at check-in to the Emergency Department and triage nurse.  Should you have questions after your visit or need to cancel or reschedule your appointment, please contact [facility]  [phone] and follow the prompts.  Office hours are 8:00 a.m. to 4:30 p.m. Monday - Friday. Please note that voicemails left after 4:00 p.m. may not be returned until the following business day.  We are closed weekends  and major holidays. You have access to a nurse at all times for urgent questions. Please call the main number to the clinic [phone] and follow the prompts.  For any non-urgent questions, you may also contact your provider using MyChart. We now offer e-Visits for anyone 100 and older to request care online for non-urgent symptoms. For details visit [email or web address].   Also download the MyChart app! Go to the app store, search "MyChart", open the app, select [facility],  and log in with your MyChart username and password.  Due to Covid, a mask is required upon entering the hospital/clinic. If you do not have a mask, one will be given to you upon arrival. For doctor visits, patients may have 1 support person aged 22 or older with them. For treatment visits, patients cannot have anyone with them due to current Covid guidelines and our immunocompromised population.

## 2020-10-10 NOTE — Progress Notes (Signed)
[facility] NOTE  Patient Care Team: Erma Heritage, MD as PCP - General (Family Medicine) Rico Junker, RN as Registered Nurse Theodore Demark, RN as Registered Nurse Ezzard Standing, PA-C as Physician Assistant (Gastroenterology) Bary Castilla, Forest Gleason, MD (General Surgery) Clent Jacks, RN as Oncology Nurse Navigator Cammie Sickle, MD as Consulting Physician (Hematology and Oncology)  CHIEF COMPLAINTS/PURPOSE OF CONSULTATION: Pancreatic cancer  #  Oncology History Overview Note  # MAY 2022-pancreatic adenocarcinoma [obstructive jaundice-]MRI-18 mm pancreatic head mass; EUS [[facility]; Dr.Spaete]-abutment of portal vein/SMV; no lymphadenopathy noted; May 5 CA 19-9-163; s/p stenting -May 25th-47.   #Obstructive jaundice-s/p ERCP stenting [Dr.Wohl]  MAY 2022- ENDOSONOGRAPHIC FINDING: : A round mass was identified in the pancreatic head.  The mass was hypoechoic. The mass measured 16 mm by 17  mm in maximal cross-sectional diameter. The outer  margins were irregular. There was sonographic evidence  suggesting invasion into the portal vein (manifested  by abutment) and the superior mesenteric vein  (manifested by abutment).   #Pancreatic adenocarcinoma pT1 however on EUS/CT [[facility]. Dr.Shah]-concerning for tumor abutting the celiac axis /common hepatic artery /portal vein/superior mesenteric vein; without any metastatic disease.  Borderline resectable Elevated CA 19-9.  [S/p evaluation at [place]  # June 9th- FOLFIRINOX cycle #1; discontinued because of poor GI tolerance  # &/01-2021-gemcitabine Abraxane [100mg /2] day 1 day 815  # July 11th-gemcitabine and Abraxane day.  ---------------------------------------------------------      Malignant neoplasm of head of pancreas ([place])  08/24/2020 Initial Diagnosis   Malignant neoplasm of head of pancreas ([place])   09/09/2020 - 09/12/2020 Chemotherapy          10/10/2020 -  Chemotherapy     Patient is on Treatment Plan: PANCREATIC ABRAXANE / GEMCITABINE D1,8,15 Q28D          HISTORY OF PRESENTING ILLNESS:  Shirley Barton 70 y.o.  female with a history of COPD; chronic abdominal pain-locally advanced pancreatic cancer with involvement of the vessels currently on neoadjuvant chemotherapy  Patient had extreme poor tolerance to FOLFIRINOX cycle #1-significant fatigue extreme nausea vomiting diarrhea.  She needed fluids in the clinic.  She declines any further FOLFIRINOX chemotherapy.  Is here to proceed with neoadjuvant gemcitabine Abraxane.  Symptomatically she is much improved the last 2 weeks.  However she has lost about 2 pounds since last visit.  Patient however states that she has been eating significantly better.  No nausea no vomiting.  Chronic diarrhea.   Review of Systems  Constitutional:  Positive for malaise/fatigue and weight loss. Negative for chills, diaphoresis and fever.  HENT:  Negative for nosebleeds and sore throat.   Eyes:  Negative for double vision.  Respiratory:  Positive for shortness of breath. Negative for cough, hemoptysis, sputum production and wheezing.   Cardiovascular:  Negative for chest pain, palpitations, orthopnea and leg swelling.  Gastrointestinal:  Positive for abdominal pain and diarrhea. Negative for blood in stool, constipation, heartburn, melena and vomiting.  Genitourinary:  Negative for dysuria, frequency and urgency.  Musculoskeletal:  Negative for back pain and joint pain.  Skin: Negative.  Negative for itching and rash.  Neurological:  Negative for dizziness, tingling, focal weakness, weakness and headaches.  Endo/Heme/Allergies:  Does not bruise/bleed easily.  Psychiatric/Behavioral:  Negative for depression. The patient is not nervous/anxious and does not have insomnia.     MEDICAL HISTORY:  Past Medical History:  Diagnosis Date   Abdominal pain, generalized    Anxiety  Atypical chest pain    Back pain    Basal cell  carcinoma of skin 07-11-2011   resected from Left scalp area.    COPD (chronic obstructive pulmonary disease) (HCC)    Depression    DOE (dyspnea on exertion)    GERD (gastroesophageal reflux disease)    Hypertension    Pancreatic cancer ([place])    Shortness of breath dyspnea     SURGICAL HISTORY: Past Surgical History:  Procedure Laterality Date   BASAL CELL CARCINOMA EXCISION  Jul 11, 2011   CARPAL TUNNEL RELEASE Left 90s   CHOLECYSTECTOMY N/A 09/16/2015   Procedure: LAPAROSCOPIC CHOLECYSTECTOMY WITH INTRAOPERATIVE CHOLANGIOGRAM;  Surgeon: Robert Bellow, MD;  Location: [facility] ORS;  Service: General;  Laterality: N/A;   COLONOSCOPY  1990"s   [place]   CORONARY ARTERY BYPASS GRAFT     ERCP N/A 08/05/2020   Procedure: ENDOSCOPIC RETROGRADE CHOLANGIOPANCREATOGRAPHY (ERCP);  Surgeon: Lucilla Lame, MD;  Location: [facility] ENDOSCOPY;  Service: Endoscopy;  Laterality: N/A;   ESOPHAGOGASTRODUODENOSCOPY (EGD) WITH PROPOFOL N/A 02/05/2017   Procedure: ESOPHAGOGASTRODUODENOSCOPY (EGD) WITH PROPOFOL;  Surgeon: Lollie Sails, MD;  Location: [facility] ENDOSCOPY;  Service: Endoscopy;  Laterality: N/A;   PERIPHERAL VASCULAR CATHETERIZATION Bilateral 12/27/2015   Procedure: Lower Extremity Angiography;  Surgeon: Katha Cabal, MD;  Location: [place] CV LAB;  Service: Cardiovascular;  Laterality: Bilateral;   PORTA CATH INSERTION N/A 09/08/2020   Procedure: PORTA CATH INSERTION;  Surgeon: Algernon Huxley, MD;  Location: [place] [facility] CV LAB;  Service: Cardiovascular;  Laterality: N/A;   TONSILLECTOMY      SOCIAL HISTORY: Social History   Socioeconomic History   Marital status: Divorced    Spouse name: Not on file   Number of children: Not on file   Years of education: Not on file   Highest education level: Not on file  Occupational History   Occupation: retired  Tobacco Use   Smoking status: Former    Pack years: 0.00    Types: Cigarettes    Quit date: 03/02/2012    Years since quitting: 8.6    Smokeless tobacco: Never  Vaping Use   Vaping Use: Never used  Substance and Sexual Activity   Alcohol use: No    Alcohol/week: 0.0 standard drinks   Drug use: No   Sexual activity: Not on file  Other Topics Concern   Not on file  Social History Narrative   [place]; self; smoker- quit 07-11-11; daughter died of leukemia-2020. 2 sons [[place]; [place]]; used to be caregiver/ retd.    Social Determinants of Health   Financial Resource Strain: Not on file  Food Insecurity: Not on file  Transportation Needs: Not on file  Physical Activity: Not on file  Stress: Not on file  Social Connections: Not on file  Intimate Partner Violence: Not on file    FAMILY HISTORY: Family History  Problem Relation Age of Onset   Cerebral palsy Daughter    Colon polyps Sister    Leukemia Daughter    Depression Son    Breast cancer Neg Hx     ALLERGIES:  is allergic to tramadol, cortizone-10 [hydrocortisone], and penicillins.  MEDICATIONS:  Current Outpatient Medications  Medication Sig Dispense Refill   acetaminophen (TYLENOL) 325 MG tablet Take 650 mg by mouth daily.     b complex vitamins capsule Take 1 capsule by mouth daily.     B Complex-C-Folic Acid (B COMPLEX-VITAMIN C-FOLIC ACID) 1 MG tablet Take 1 tablet by mouth daily with breakfast. 30 tablet 0  cetirizine (ZYRTEC) 10 MG tablet Take 10 mg by mouth daily.     Fluticasone-Salmeterol (ADVAIR) 100-50 MCG/DOSE AEPB Inhale 1 puff into the lungs 2 (two) times daily.     HYDROcodone-acetaminophen (NORCO) 5-325 MG tablet Take 1 tablet by mouth every 6 (six) hours as needed for moderate pain. 45 tablet 0   lidocaine-prilocaine (EMLA) cream Apply 30 -45 mins prior to port access. 30 g 0   ondansetron (ZOFRAN) 8 MG tablet One pill every 8 hours as needed for nausea/vomitting. 40 tablet 1   pantoprazole (PROTONIX) 40 MG tablet Take by mouth.     prochlorperazine (COMPAZINE) 10 MG tablet Take 1 tablet (10 mg total) by mouth every 6 (six) hours as  needed for nausea or vomiting. 40 tablet 1   diphenoxylate-atropine (LOMOTIL) 2.5-0.025 MG tablet Take 1 tablet by mouth 4 (four) times daily as needed for diarrhea or loose stools. 60 tablet 1   magic mouthwash w/lidocaine SOLN Take 10 mLs by mouth every 2 (two) hours as needed for mouth pain. (Patient not taking: No sig reported) 360 mL 0   ondansetron (ZOFRAN ODT) 4 MG disintegrating tablet Take 1 tablet (4 mg total) by mouth every 6 (six) hours as needed for nausea or vomiting. (Patient not taking: Reported on 10/10/2020) 20 tablet 0   No current facility-administered medications for this visit.   Facility-Administered Medications Ordered in Other Visits  Medication Dose Route Frequency Provider Last Rate Last Admin   gemcitabine (GEMZAR) 1,400 mg in sodium chloride 0.9 % 250 mL chemo infusion  1,400 mg Intravenous Once Charlaine Dalton R, MD       heparin lock flush 100 unit/mL  500 Units Intracatheter Once PRN Cammie Sickle, MD       PACLitaxel-protein bound (ABRAXANE) chemo infusion 150 mg  100 mg/m2 (Treatment Plan Recorded) Intravenous Once Cammie Sickle, MD 60 mL/hr at 10/10/20 1108 150 mg at 10/10/20 1108      .  PHYSICAL EXAMINATION: ECOG PERFORMANCE STATUS: 0 - Asymptomatic  Vitals:   10/10/20 0912  BP: (!) 123/98  Pulse: 62  Resp: (!) 22  Temp: (!) 96.6 F (35.9 C)  SpO2: 100%   Filed Weights   10/10/20 0912  Weight: 98 lb 3.2 oz (44.5 kg)    Physical Exam Constitutional:      Comments: Caucasian female patient ambulating independently.  Accompanied by her son.  HENT:     Head: Normocephalic and atraumatic.     Mouth/Throat:     Pharynx: No oropharyngeal exudate.  Eyes:     Pupils: Pupils are equal, round, and reactive to light.  Cardiovascular:     Rate and Rhythm: Normal rate and regular rhythm.  Pulmonary:     Effort: No respiratory distress.     Breath sounds: No wheezing.     Comments: Decreased air entry bilaterally.  No wheeze or  crackles Abdominal:     General: Bowel sounds are normal. There is no distension.     Palpations: Abdomen is soft. There is no mass.     Tenderness: There is no abdominal tenderness. There is no guarding or rebound.  Musculoskeletal:        General: No tenderness. Normal range of motion.     Cervical back: Normal range of motion and neck supple.  Skin:    General: Skin is warm.  Neurological:     Mental Status: She is alert and oriented to person, place, and time.  Psychiatric:  Mood and Affect: Affect normal.     LABORATORY DATA:  I have reviewed the data as listed Lab Results  Component Value Date   WBC 8.0 10/10/2020   HGB 12.7 10/10/2020   HCT 40.4 10/10/2020   MCV 93.3 10/10/2020   PLT 191 10/10/2020   Recent Labs    08/04/20 1138 08/06/20 0457 09/16/20 0844 09/26/20 0921 10/10/20 0844  NA  --    < > 134*  133* 136 135  K  --    < > 3.8  3.8 3.0* 4.1  CL  --    < > 97*  97* 96* 105  CO2  --    < > 26  26 30 23   GLUCOSE  --    < > 98  98 93 97  BUN  --    < > 9  9 6* 8  CREATININE  --    < > 0.60  0.58 0.64 0.52  CALCIUM  --    < > 8.9  8.8* 8.7* 9.0  GFRNONAA  --    < > >60  >60 >60 >60  PROT  --    < > 7.0 6.3* 7.5  ALBUMIN  --    < > 3.7 2.9* 3.7  AST  --    < > 22 46* 44*  ALT  --    < > 17 122* 38  ALKPHOS  --    < > 74 394* 202*  BILITOT  --    < > 1.0 0.6 0.7  BILIDIR 3.7*  --   --   --   --    < > = values in this interval not displayed.    RADIOGRAPHIC STUDIES: I have personally reviewed the radiological images as listed and agreed with the findings in the report. No results found.   ASSESSMENT & PLAN:   Malignant neoplasm of head of pancreas [facility]) #Pancreatic adenocarcinoma pT1 however on EUS/CT [[facility]. Dr.Shah]-concerning for tumor abutting the celiac axis /common hepatic artery /portal vein/superior mesenteric vein; without any metastatic disease.  Borderline resectable Elevated CA 19-9. S/p FOLFIRINOX cycle #1.  Extreme poor  tolerance to FOLFIRINOX-we will discontinue.  Plan gemcitabine Abraxane [day 1 day 8-day 15.  28 days]   #Proceed with cycle #1 gemcitabine Abraxane day 1 today; Labs today reviewed;  acceptable for treatment today.  We will dose reduce Abraxane to 100 mg per metered square given the concerns of ongoing diarrhea.  This was extensively reviewed with patient and son.  # weight loss: s/p Joli; awaiting repeat appt this week.   # Chronic Diarrhea: Continue Imodium/Lomotil-currently at baseline bowel movements.  # Elevated LFTs-suspect secondary to chemotherapy not biliary obstruction-improving monitor closely.  #COPD/currently quit smoking not on home O2; on inhalers-STABLE.   # DISPOSITION: # chemo today # appts as planned- ------------------------------------- # follow up in 2 weeks- MD; labs- cbc/cmp; ca-19-9;D-1 Gem-Abraxane; # in 3 weeks-labs- cbc/cmpGem-Abrxane # in 4 weeks- abs- cbc/cmp; ca-19-9;D-1 Gem-Abraxane- Dr.B  All questions were answered. The patient knows to call the clinic with any problems, questions or concerns.    Cammie Sickle, MD 10/10/2020 11:34 AM

## 2020-10-10 NOTE — Assessment & Plan Note (Addendum)
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 Dr.Shah]-concerning for tumor abutting the celiac axis /common hepatic artery /portal vein/superior mesenteric vein; without any metastatic disease.  Borderline resectable Elevated CA 19-9. S/p FOLFIRINOX cycle #1.  Extreme poor tolerance to FOLFIRINOX-we will discontinue.  Plan gemcitabine Abraxane [day 1 day 8-day 15.  28 days]   #Proceed with cycle #1 gemcitabine Abraxane day 1 today; Labs today reviewed;  acceptable for treatment today.  We will dose reduce Abraxane to 100 mg per metered square given the concerns of ongoing diarrhea.  This was extensively reviewed with patient and son.  # weight loss: s/p Joli; awaiting repeat appt this week.   # Chronic Diarrhea: Continue Imodium/Lomotil-currently at baseline bowel movements.  # Elevated LFTs-suspect secondary to chemotherapy not biliary obstruction-improving monitor closely.  #COPD/currently quit smoking not on home O2; on inhalers-STABLE.   # DISPOSITION: # chemo today # appts as planned- ------------------------------------- # follow up in 2 weeks- MD; labs- cbc/cmp; ca-19-9;D-1 Gem-Abraxane; # in 3 weeks-labs- cbc/cmpGem-Abrxane # in 4 weeks- abs- cbc/cmp; ca-19-9;D-1 Gem-Abraxane- Dr.B

## 2020-10-11 LAB — CANCER ANTIGEN 19-9: CA 19-9: 72 U/mL — ABNORMAL HIGH (ref 0–35)

## 2020-10-13 ENCOUNTER — Inpatient Hospital Stay: Payer: Medicare HMO

## 2020-10-13 NOTE — Progress Notes (Signed)
Nutrition Follow-up:    Patient with pancreatic cancer.  Patient received cycle 1 folforinox and unable to tolerate.  Treatment switched to gemcitabine and abraxane.    Spoke with patient via phone.  Patient reports that her stomach hurts, some nausea, no diarrhea and decreased appetite.  "It is like my body does not want me to eat."  Has drank an ensure today and ate 1/2 cupcake.  Has been drinking water, gatorade. Says that she has been taking nausea medication.  Has not taken any pain medication today.    Patient tearful during conversation.   Medications: reviewed  Labs: reviewed  Anthropometrics:   Weight 98 lb on 7/11  100 lb on 6/20 103 lb on 6/9  UBW of 116-124 lb   NUTRITION DIAGNOSIS: Inadequate oral intake continues    INTERVENTION:  Samples of enterade will be left a front desk for patient to pick up and try.  Encouraged taking pain and nausea medication to help with symptoms  Encouraged small frequent nibbles of bland foods (soups, pretzels, toast, applesauce, etc). Offered [facility] clinic but declined today.  Said that if she was not better by tomorrow she would call.    MONITORING, EVALUATION, GOAL: Weight trends, intake   NEXT VISIT: Thursday, July 21 phone call  Niasia Lanphear B. Zenia Resides, [place], [place] Registered Dietitian [phone] (mobile)

## 2020-10-17 ENCOUNTER — Other Ambulatory Visit: Payer: Self-pay | Admitting: *Deleted

## 2020-10-17 ENCOUNTER — Inpatient Hospital Stay: Payer: Medicare HMO

## 2020-10-17 ENCOUNTER — Other Ambulatory Visit: Payer: Self-pay

## 2020-10-17 ENCOUNTER — Other Ambulatory Visit: Payer: Self-pay | Admitting: Internal Medicine

## 2020-10-17 VITALS — BP 153/85 | HR 84 | Temp 96.6°F | Wt 97.5 lb

## 2020-10-17 DIAGNOSIS — C250 Malignant neoplasm of head of pancreas: Secondary | ICD-10-CM

## 2020-10-17 DIAGNOSIS — R112 Nausea with vomiting, unspecified: Secondary | ICD-10-CM

## 2020-10-17 DIAGNOSIS — Z95828 Presence of other vascular implants and grafts: Secondary | ICD-10-CM

## 2020-10-17 DIAGNOSIS — T451X5A Adverse effect of antineoplastic and immunosuppressive drugs, initial encounter: Secondary | ICD-10-CM

## 2020-10-17 DIAGNOSIS — E876 Hypokalemia: Secondary | ICD-10-CM

## 2020-10-17 DIAGNOSIS — Z5111 Encounter for antineoplastic chemotherapy: Secondary | ICD-10-CM | POA: Diagnosis not present

## 2020-10-17 LAB — CBC WITH DIFFERENTIAL/PLATELET
Abs Immature Granulocytes: 0.02 10*3/uL (ref 0.00–0.07)
Basophils Absolute: 0 10*3/uL (ref 0.0–0.1)
Basophils Relative: 0 %
Eosinophils Absolute: 0 10*3/uL (ref 0.0–0.5)
Eosinophils Relative: 1 %
HCT: 34.9 % — ABNORMAL LOW (ref 36.0–46.0)
Hemoglobin: 11.3 g/dL — ABNORMAL LOW (ref 12.0–15.0)
Immature Granulocytes: 0 %
Lymphocytes Relative: 28 %
Lymphs Abs: 1.3 10*3/uL (ref 0.7–4.0)
MCH: 29.9 pg (ref 26.0–34.0)
MCHC: 32.4 g/dL (ref 30.0–36.0)
MCV: 92.3 fL (ref 80.0–100.0)
Monocytes Absolute: 0.3 10*3/uL (ref 0.1–1.0)
Monocytes Relative: 7 %
Neutro Abs: 2.9 10*3/uL (ref 1.7–7.7)
Neutrophils Relative %: 64 %
Platelets: 82 10*3/uL — ABNORMAL LOW (ref 150–400)
RBC: 3.78 MIL/uL — ABNORMAL LOW (ref 3.87–5.11)
RDW: 14 % (ref 11.5–15.5)
WBC: 4.6 10*3/uL (ref 4.0–10.5)
nRBC: 0 % (ref 0.0–0.2)

## 2020-10-17 LAB — COMPREHENSIVE METABOLIC PANEL
ALT: 53 U/L — ABNORMAL HIGH (ref 0–44)
AST: 44 U/L — ABNORMAL HIGH (ref 15–41)
Albumin: 3.3 g/dL — ABNORMAL LOW (ref 3.5–5.0)
Alkaline Phosphatase: 271 U/L — ABNORMAL HIGH (ref 38–126)
Anion gap: 10 (ref 5–15)
BUN: 9 mg/dL (ref 8–23)
CO2: 26 mmol/L (ref 22–32)
Calcium: 9.1 mg/dL (ref 8.9–10.3)
Chloride: 101 mmol/L (ref 98–111)
Creatinine, Ser: 0.52 mg/dL (ref 0.44–1.00)
GFR, Estimated: >60 mL/min (ref >60)
Glucose, Bld: 95 mg/dL (ref 70–99)
Potassium: 3.1 mmol/L — ABNORMAL LOW (ref 3.5–5.1)
Sodium: 137 mmol/L (ref 135–145)
Total Bilirubin: 1.9 mg/dL — ABNORMAL HIGH (ref 0.3–1.2)
Total Protein: 7 g/dL (ref 6.5–8.1)

## 2020-10-17 MED ORDER — POTASSIUM CHLORIDE CRYS ER 20 MEQ PO TBCR: 20.0000 meq | EXTENDED_RELEASE_TABLET | Freq: Two times a day (BID) | ORAL | 0 refills | Status: DC

## 2020-10-17 MED ORDER — HEPARIN SOD (PORK) LOCK FLUSH 100 UNIT/ML IV SOLN
500.0000 [IU] | Freq: Once | INTRAVENOUS | Status: AC
  Administered 2020-10-17: 500 [IU] via INTRAVENOUS
  Filled 2020-10-17: qty 5

## 2020-10-17 MED ORDER — PROCHLORPERAZINE MALEATE 10 MG PO TABS: 10.0000 mg | ORAL_TABLET | Freq: Four times a day (QID) | ORAL | 3 refills | Status: DC | PRN

## 2020-10-17 MED ORDER — SODIUM CHLORIDE 0.9% FLUSH
10.0000 mL | Freq: Once | INTRAVENOUS | Status: AC
  Administered 2020-10-17: 10 mL via INTRAVENOUS
  Filled 2020-10-17: qty 10

## 2020-10-17 MED ORDER — SODIUM CHLORIDE 0.9 % IV SOLN
1400.0000 mg | Freq: Once | INTRAVENOUS | Status: AC
  Administered 2020-10-17: 1400 mg via INTRAVENOUS
  Filled 2020-10-17: qty 26.3

## 2020-10-17 MED ORDER — PACLITAXEL PROTEIN-BOUND CHEMO INJECTION 100 MG
100.0000 mg/m2 | Freq: Once | INTRAVENOUS | Status: AC
  Administered 2020-10-17: 150 mg via INTRAVENOUS
  Filled 2020-10-17: qty 30

## 2020-10-17 MED ORDER — PROCHLORPERAZINE MALEATE 10 MG PO TABS
10.0000 mg | ORAL_TABLET | Freq: Once | ORAL | Status: AC
  Administered 2020-10-17: 10 mg via ORAL

## 2020-10-17 MED ORDER — ONDANSETRON HCL 8 MG PO TABS
ORAL_TABLET | ORAL | 3 refills | Status: AC
Start: 2020-10-17 — End: ?

## 2020-10-17 MED ORDER — SODIUM CHLORIDE 0.9 % IV SOLN
Freq: Once | INTRAVENOUS | Status: AC
Start: 2020-10-17 — End: 2020-10-17
  Filled 2020-10-17: qty 250

## 2020-10-17 MED ORDER — HEPARIN SOD (PORK) LOCK FLUSH 100 UNIT/ML IV SOLN
INTRAVENOUS | Status: AC
  Filled 2020-10-17: qty 5

## 2020-10-17 MED ORDER — HYDROCODONE-ACETAMINOPHEN 5-325 MG PO TABS: 1.0000 | ORAL_TABLET | Freq: Four times a day (QID) | ORAL | 0 refills | Status: DC | PRN

## 2020-10-17 NOTE — Patient Instructions (Signed)
[facility] ONCOLOGY  Discharge Instructions: Thank you for choosing [facility] to provide your oncology and hematology care.  If you have a lab appointment with the [facility], please go directly to the [facility] and check in at the registration area.  Wear comfortable clothing and clothing appropriate for easy access to any Portacath or PICC line.   We strive to give you quality time with your provider. You may need to reschedule your appointment if you arrive late (15 or more minutes).  Arriving late affects you and other patients whose appointments are after yours.  Also, if you miss three or more appointments without notifying the office, you may be dismissed from the clinic at the provider's discretion.      For prescription refill requests, have your pharmacy contact our office and allow 72 hours for refills to be completed.    Today you received the following chemotherapy and/or immunotherapy agents : Abraxane / Gemzar     To help prevent nausea and vomiting after your treatment, we encourage you to take your nausea medication as directed.  BELOW ARE SYMPTOMS THAT SHOULD BE REPORTED IMMEDIATELY: *FEVER GREATER THAN 100.4 F (38 C) OR HIGHER *CHILLS OR SWEATING *NAUSEA AND VOMITING THAT IS NOT CONTROLLED WITH YOUR NAUSEA MEDICATION *UNUSUAL SHORTNESS OF BREATH *UNUSUAL BRUISING OR BLEEDING *URINARY PROBLEMS (pain or burning when urinating, or frequent urination) *BOWEL PROBLEMS (unusual diarrhea, constipation, pain near the anus) TENDERNESS IN MOUTH AND THROAT WITH OR WITHOUT PRESENCE OF ULCERS (sore throat, sores in mouth, or a toothache) UNUSUAL RASH, SWELLING OR PAIN  UNUSUAL VAGINAL DISCHARGE OR ITCHING   Items with * indicate a potential emergency and should be followed up as soon as possible or go to the Emergency Department if any problems should occur.  Please show the CHEMOTHERAPY ALERT CARD or IMMUNOTHERAPY ALERT CARD at  check-in to the Emergency Department and triage nurse.  Should you have questions after your visit or need to cancel or reschedule your appointment, please contact [facility]  [phone] and follow the prompts.  Office hours are 8:00 a.m. to 4:30 p.m. Monday - Friday. Please note that voicemails left after 4:00 p.m. may not be returned until the following business day.  We are closed weekends and major holidays. You have access to a nurse at all times for urgent questions. Please call the main number to the clinic [phone] and follow the prompts.  For any non-urgent questions, you may also contact your provider using MyChart. We now offer e-Visits for anyone 46 and older to request care online for non-urgent symptoms. For details visit [email or web address].   Also download the MyChart app! Go to the app store, search "MyChart", open the app, select [facility], and log in with your MyChart username and password.  Due to Covid, a mask is required upon entering the hospital/clinic. If you do not have a mask, one will be given to you upon arrival. For doctor visits, patients may have 1 support person aged 42 or older with them. For treatment visits, patients cannot have anyone with them due to current Covid guidelines and our immunocompromised population.

## 2020-10-17 NOTE — Telephone Encounter (Signed)
Patient here for treatment today. She requested RF on zofran, compazine and hydrocodone.  Potassium 3.1 today/ v/o to renew the zofran, compazine and to call in [place] 20 meq 1 tablet twice daily.  Dr. B please RF the hydrocodone.

## 2020-10-17 NOTE — Progress Notes (Signed)
MD reviewed labs- ok to proceed with tx.

## 2020-10-17 NOTE — Progress Notes (Signed)
MD reviewed labs, ok to proceed today

## 2020-10-19 LAB — CANCER ANTIGEN 19-9: CA 19-9: 100 U/mL — ABNORMAL HIGH (ref 0–35)

## 2020-10-20 ENCOUNTER — Inpatient Hospital Stay (HOSPITAL_BASED_OUTPATIENT_CLINIC_OR_DEPARTMENT_OTHER): Payer: Medicare HMO | Admitting: Hospice and Palliative Medicine

## 2020-10-20 ENCOUNTER — Other Ambulatory Visit: Payer: Self-pay

## 2020-10-20 ENCOUNTER — Other Ambulatory Visit: Payer: Self-pay | Admitting: *Deleted

## 2020-10-20 ENCOUNTER — Inpatient Hospital Stay: Payer: Medicare HMO

## 2020-10-20 VITALS — BP 139/91 | HR 96

## 2020-10-20 VITALS — BP 115/81 | HR 110 | Temp 98.0°F | Resp 16 | Ht 64.0 in | Wt 95.0 lb

## 2020-10-20 DIAGNOSIS — R197 Diarrhea, unspecified: Secondary | ICD-10-CM | POA: Diagnosis not present

## 2020-10-20 DIAGNOSIS — K909 Intestinal malabsorption, unspecified: Secondary | ICD-10-CM

## 2020-10-20 DIAGNOSIS — Z95828 Presence of other vascular implants and grafts: Secondary | ICD-10-CM

## 2020-10-20 DIAGNOSIS — C250 Malignant neoplasm of head of pancreas: Secondary | ICD-10-CM

## 2020-10-20 DIAGNOSIS — Z5111 Encounter for antineoplastic chemotherapy: Secondary | ICD-10-CM | POA: Diagnosis not present

## 2020-10-20 LAB — COMPREHENSIVE METABOLIC PANEL
ALT: 135 U/L — ABNORMAL HIGH (ref 0–44)
AST: 110 U/L — ABNORMAL HIGH (ref 15–41)
Albumin: 3 g/dL — ABNORMAL LOW (ref 3.5–5.0)
Alkaline Phosphatase: 303 U/L — ABNORMAL HIGH (ref 38–126)
Anion gap: 9 (ref 5–15)
BUN: 26 mg/dL — ABNORMAL HIGH (ref 8–23)
CO2: 26 mmol/L (ref 22–32)
Calcium: 8.6 mg/dL — ABNORMAL LOW (ref 8.9–10.3)
Chloride: 100 mmol/L (ref 98–111)
Creatinine, Ser: 0.58 mg/dL (ref 0.44–1.00)
GFR, Estimated: >60 mL/min (ref >60)
Glucose, Bld: 170 mg/dL — ABNORMAL HIGH (ref 70–99)
Potassium: 3.4 mmol/L — ABNORMAL LOW (ref 3.5–5.1)
Sodium: 135 mmol/L (ref 135–145)
Total Bilirubin: 2.7 mg/dL — ABNORMAL HIGH (ref 0.3–1.2)
Total Protein: 6.7 g/dL (ref 6.5–8.1)

## 2020-10-20 LAB — CBC WITH DIFFERENTIAL/PLATELET
Abs Immature Granulocytes: 0.29 10*3/uL — ABNORMAL HIGH (ref 0.00–0.07)
Basophils Absolute: 0 10*3/uL (ref 0.0–0.1)
Basophils Relative: 0 %
Eosinophils Absolute: 0 10*3/uL (ref 0.0–0.5)
Eosinophils Relative: 1 %
HCT: 32.4 % — ABNORMAL LOW (ref 36.0–46.0)
Hemoglobin: 10.8 g/dL — ABNORMAL LOW (ref 12.0–15.0)
Immature Granulocytes: 4 %
Lymphocytes Relative: 10 %
Lymphs Abs: 0.8 10*3/uL (ref 0.7–4.0)
MCH: 29.9 pg (ref 26.0–34.0)
MCHC: 33.3 g/dL (ref 30.0–36.0)
MCV: 89.8 fL (ref 80.0–100.0)
Monocytes Absolute: 0.1 10*3/uL (ref 0.1–1.0)
Monocytes Relative: 1 %
Neutro Abs: 6.3 10*3/uL (ref 1.7–7.7)
Neutrophils Relative %: 84 %
Platelets: 77 10*3/uL — ABNORMAL LOW (ref 150–400)
RBC: 3.61 MIL/uL — ABNORMAL LOW (ref 3.87–5.11)
RDW: 14.6 % (ref 11.5–15.5)
WBC: 7.5 10*3/uL (ref 4.0–10.5)
nRBC: 0 % (ref 0.0–0.2)

## 2020-10-20 LAB — MAGNESIUM: Magnesium: 1.9 mg/dL (ref 1.7–2.4)

## 2020-10-20 MED ORDER — SODIUM CHLORIDE 0.9 % IV SOLN
Freq: Once | INTRAVENOUS | Status: AC
Start: 2020-10-20 — End: 2020-10-20
  Filled 2020-10-20: qty 250

## 2020-10-20 MED ORDER — HEPARIN SOD (PORK) LOCK FLUSH 100 UNIT/ML IV SOLN
500.0000 [IU] | Freq: Once | INTRAVENOUS | Status: AC
  Administered 2020-10-20: 500 [IU] via INTRAVENOUS
  Filled 2020-10-20: qty 5

## 2020-10-20 MED ORDER — SODIUM CHLORIDE 0.9% FLUSH
10.0000 mL | Freq: Once | INTRAVENOUS | Status: AC
  Administered 2020-10-20: 10 mL via INTRAVENOUS
  Filled 2020-10-20: qty 10

## 2020-10-20 NOTE — Patient Instructions (Signed)
Continue to take your oral potassium as directed.

## 2020-10-20 NOTE — Progress Notes (Signed)
Symptom Management [place]  [phone]) [phone] [phone]  Patient Care Team: Erma Heritage, MD as PCP - General (Family Medicine) Rico Junker, RN as Registered Nurse Theodore Demark, RN as Registered Nurse Adron Bene as Physician Assistant (Gastroenterology) Bary Castilla Forest Gleason, MD (General Surgery) Clent Jacks, RN as Oncology Nurse Navigator Cammie Sickle, MD as Consulting Physician (Hematology and Oncology)   Name of the patient: Shirley Barton  SX:[id number]  1950/05/11   Date of visit: 10/20/20  Reason for Consult:  Shirley Barton is a 70 year old woman with multiple medical problems including stage I pancreatic adenocarcinoma previously treated with FOLFIRINOX but she had extremely poor tolerance.  Patient was started on cycle 1 gemcitabine/Abraxane which she received day 1 on 10/10/2020 and day 8 on 10/17/2020.  Since last receiving chemotherapy, patient has had diarrhea and presents to [facility] today for evaluation.  Patient reports that she has had 2-3 loose stools daily since she last received chemotherapy.  She denies any fever or chills.  No abdominal pain.  No nausea or vomiting.  She has generalized fatigue and poor appetite.  She has been taking Lomotil every 6 hours as needed.  Denies any neurologic complaints. Denies recent fevers or illnesses. Denies any easy bleeding or bruising. Denies chest pain. Denies any nausea, vomiting, constipation. Denies urinary complaints. Patient offers no further specific complaints today.    PAST MEDICAL HISTORY: Past Medical History:  Diagnosis Date   Abdominal pain, generalized    Anxiety    Atypical chest pain    Back pain    Basal cell carcinoma of skin 2013   resected from Left scalp area.    COPD (chronic obstructive pulmonary disease) (HCC)    Depression    DOE (dyspnea on exertion)    GERD (gastroesophageal reflux disease)    Hypertension     Pancreatic cancer ([place])    Shortness of breath dyspnea     PAST SURGICAL HISTORY:  Past Surgical History:  Procedure Laterality Date   BASAL CELL CARCINOMA EXCISION  2013   CARPAL TUNNEL RELEASE Left 90s   CHOLECYSTECTOMY N/A 09/16/2015   Procedure: LAPAROSCOPIC CHOLECYSTECTOMY WITH INTRAOPERATIVE CHOLANGIOGRAM;  Surgeon: Robert Bellow, MD;  Location: [facility] ORS;  Service: General;  Laterality: N/A;   COLONOSCOPY  1990"s   [place]   CORONARY ARTERY BYPASS GRAFT     ERCP N/A 08/05/2020   Procedure: ENDOSCOPIC RETROGRADE CHOLANGIOPANCREATOGRAPHY (ERCP);  Surgeon: Lucilla Lame, MD;  Location: [facility] ENDOSCOPY;  Service: Endoscopy;  Laterality: N/A;   ESOPHAGOGASTRODUODENOSCOPY (EGD) WITH PROPOFOL N/A 02/05/2017   Procedure: ESOPHAGOGASTRODUODENOSCOPY (EGD) WITH PROPOFOL;  Surgeon: Lollie Sails, MD;  Location: [facility] ENDOSCOPY;  Service: Endoscopy;  Laterality: N/A;   PERIPHERAL VASCULAR CATHETERIZATION Bilateral 12/27/2015   Procedure: Lower Extremity Angiography;  Surgeon: Katha Cabal, MD;  Location: [facility] CV LAB;  Service: Cardiovascular;  Laterality: Bilateral;   PORTA CATH INSERTION N/A 09/08/2020   Procedure: PORTA CATH INSERTION;  Surgeon: Algernon Huxley, MD;  Location: [facility] CV LAB;  Service: Cardiovascular;  Laterality: N/A;   TONSILLECTOMY      HEMATOLOGY/ONCOLOGY HISTORY:  Oncology History Overview Note  # MAY 2022-pancreatic adenocarcinoma [obstructive jaundice-]MRI-18 mm pancreatic head mass; EUS [[facility]; Dr.Spaete]-abutment of portal vein/SMV; no lymphadenopathy noted; May 5 CA [id number]; s/p stenting -May 25th-47.   #Obstructive jaundice-s/p ERCP stenting [Dr.Wohl]  MAY 2022- ENDOSONOGRAPHIC FINDING: : A round mass was identified in the pancreatic head.  The mass was  hypoechoic. The mass measured 16 mm by 17  mm in maximal cross-sectional diameter. The outer  margins were irregular. There was sonographic evidence  suggesting invasion into the portal  vein (manifested  by abutment) and the superior mesenteric vein  (manifested by abutment).   #Pancreatic adenocarcinoma pT1 however on EUS/CT [[facility]. Dr.Shah]-concerning for tumor abutting the celiac axis /common hepatic artery /portal vein/superior mesenteric vein; without any metastatic disease.  Borderline resectable Elevated CA 19-9.  [S/p evaluation at [facility]  # June 9th- FOLFIRINOX cycle #1; discontinued because of poor GI tolerance  # &/01-2021-gemcitabine Abraxane ['100mg'$ /2] day 1 day 815  # July 11th-gemcitabine and Abraxane day.  ---------------------------------------------------------      Malignant neoplasm of head of pancreas ([place])  08/24/2020 Initial Diagnosis   Malignant neoplasm of head of pancreas ([place])   09/09/2020 - 09/12/2020 Chemotherapy          10/10/2020 -  Chemotherapy    Patient is on Treatment Plan: PANCREATIC ABRAXANE / GEMCITABINE D1,8,15 Q28D         ALLERGIES:  is allergic to tramadol, cortizone-10 [hydrocortisone], and penicillins.  MEDICATIONS:  Current Outpatient Medications  Medication Sig Dispense Refill   acetaminophen (TYLENOL) 325 MG tablet Take 650 mg by mouth daily.     b complex vitamins capsule Take 1 capsule by mouth daily.     B Complex-C-Folic Acid (B COMPLEX-VITAMIN C-FOLIC ACID) 1 MG tablet Take 1 tablet by mouth daily with breakfast. 30 tablet 0   cetirizine (ZYRTEC) 10 MG tablet Take 10 mg by mouth daily.     diphenoxylate-atropine (LOMOTIL) 2.5-0.025 MG tablet Take 1 tablet by mouth 4 (four) times daily as needed for diarrhea or loose stools. 60 tablet 1   Fluticasone-Salmeterol (ADVAIR) 100-50 MCG/DOSE AEPB Inhale 1 puff into the lungs 2 (two) times daily.     HYDROcodone-acetaminophen (NORCO) 5-325 MG tablet Take 1 tablet by mouth every 6 (six) hours as needed for moderate pain. 45 tablet 0   lidocaine-prilocaine (EMLA) cream Apply 30 -45 mins prior to port access. 30 g 0   magic mouthwash w/lidocaine SOLN  Take 10 mLs by mouth every 2 (two) hours as needed for mouth pain. (Patient not taking: No sig reported) 360 mL 0   ondansetron (ZOFRAN ODT) 4 MG disintegrating tablet Take 1 tablet (4 mg total) by mouth every 6 (six) hours as needed for nausea or vomiting. (Patient not taking: Reported on 10/10/2020) 20 tablet 0   ondansetron (ZOFRAN) 8 MG tablet One pill every 8 hours as needed for nausea/vomitting. 40 tablet 3   pantoprazole (PROTONIX) 40 MG tablet Take by mouth.     potassium chloride SA (KLOR-CON) 20 MEQ tablet Take 1 tablet (20 mEq total) by mouth 2 (two) times daily. 60 tablet 0   prochlorperazine (COMPAZINE) 10 MG tablet Take 1 tablet (10 mg total) by mouth every 6 (six) hours as needed for nausea or vomiting. 40 tablet 3   No current facility-administered medications for this visit.    VITAL SIGNS: LMP 01/03/1995 (Approximate)  There were no vitals filed for this visit.  Estimated body mass index is 16.74 kg/m as calculated from the following:   Height as of 10/10/20: '5\' 4"'$  (1.626 m).   Weight as of 10/17/20: 97 lb 8 oz (44.2 kg).  LABS: CBC:    Component Value Date/Time   WBC 4.6 10/17/2020 0904   HGB 11.3 (L) 10/17/2020 0904   HGB 14.0 08/10/2020 1010   HCT 34.9 (L) 10/17/2020 [id number]  HCT 42.1 08/10/2020 1010   PLT 82 (L) 10/17/2020 0904   PLT 256 08/10/2020 1010   MCV 92.3 10/17/2020 0904   MCV 91 08/10/2020 1010   NEUTROABS 2.9 10/17/2020 0904   NEUTROABS 4.9 08/10/2020 1010   LYMPHSABS 1.3 10/17/2020 0904   LYMPHSABS 1.6 08/10/2020 1010   MONOABS 0.3 10/17/2020 0904   EOSABS 0.0 10/17/2020 0904   EOSABS 0.2 08/10/2020 1010   BASOSABS 0.0 10/17/2020 0904   BASOSABS 0.1 08/10/2020 1010   Comprehensive Metabolic Panel:    Component Value Date/Time   NA 137 10/17/2020 0904   NA 141 10/06/2015 1406   K 3.1 (L) 10/17/2020 0904   CL 101 10/17/2020 0904   CO2 26 10/17/2020 0904   BUN 9 10/17/2020 0904   BUN 7 (L) 10/06/2015 1406   CREATININE 0.52 10/17/2020 0904    GLUCOSE 95 10/17/2020 0904   CALCIUM 9.1 10/17/2020 0904   AST 44 (H) 10/17/2020 0904   ALT 53 (H) 10/17/2020 0904   ALKPHOS 271 (H) 10/17/2020 0904   BILITOT 1.9 (H) 10/17/2020 0904   BILITOT 0.5 10/06/2015 1406   PROT 7.0 10/17/2020 0904   PROT 7.5 10/06/2015 1406   ALBUMIN 3.3 (L) 10/17/2020 0904   ALBUMIN 4.7 10/06/2015 1406    RADIOGRAPHIC STUDIES: No results found.  PERFORMANCE STATUS (ECOG) : 1 - Symptomatic but completely ambulatory  Review of Systems Unless otherwise noted, a complete review of systems is negative.  Physical Exam General: NAD Cardiovascular: regular rate and rhythm Pulmonary: clear ant fields Abdomen: soft, nontender, + bowel sounds GU: no suprapubic tenderness Extremities: no edema, no joint deformities Skin: no rashes Neurological: Weakness but otherwise nonfocal  Assessment and Plan- Patient is a 70 y.o. female stage I pancreatic adenocarcinoma on systemic chemo with Abraxane/gemcitabine who presents to [facility] for evaluation of diarrhea   Diarrhea -likely secondary to chemotherapy.  Will provide continued supportive care with IV fluids today.  Continue as needed antidiarrheals.  Unfortunately, patient appears to be somewhat poorly tolerant to chemotherapy.  She sees Dr. Rogue Bussing next week for further discussion regarding treatment options.  Hypokalemia -improved since last labs.  Continue oral KCl supplementation  Case and plan discussed with Dr. Rogue Bussing  Patient expressed understanding and was in agreement with this plan. She also understands that She can call clinic at any time with any questions, concerns, or complaints.   Thank you for allowing me to participate in the care of this very pleasant patient.   Time Total: 20 minutes  Visit consisted of counseling and education dealing with the complex and emotionally intense issues of symptom management and palliative care in the setting of serious and potentially life-threatening  illness.Greater than 50%  of this time was spent counseling and coordinating care related to the above assessment and plan.  Signed by: Altha Harm, PhD, NP-C

## 2020-10-20 NOTE — Progress Notes (Signed)
Nutrition Follow-up:   Patient with pancreatic cancer.  Patient unable to tolerate folforinox.  Treatment switched to gemcitabine and abraxane.   Spoke with patient via phone this am.  Patient reports that she has been in the bed for the last 3 days following chemo treatment on Monday due to extreme fatigue.  Says that she has not been eating during this time frame, only drinking water.  She said that she started with diarrhea on Monday after she got home.  Has had 2 loose stools so far today, 1-2 yesterday. Says she is taking lomotil.  She is trying to drink some ensure this am. Has not tried the enterade samples  Medications: lomotil, zofran, protonix, compazine  Labs: reviewed  Anthropometrics:   Weight 97 lb 8 oz on 7/18  98 lb on 7/11 100 lb on 6/20 103 lb on 6/9  UBW of 116-124 lb  6% weight loss in the last month and 1 week, significant  NUTRITION DIAGNOSIS: Inadequate oral intake continues   MALNUTRITION DIAGNOSIS: Patient meets criteria for severe malnutrition in context of acute illness as evidenced by 6% weight loss in last month and 1 week and eating less than 50% estimated energy needs for > or equal to 5 days.    INTERVENTION:  Secure chat message sent to Dr B and team regarding patient's condition.  Patient to be seen in [facility] today.  Encouraged patient to try enterade beverage.  Continue ensure as able and bland foods.     MONITORING, EVALUATION, GOAL: weight trends, intake   NEXT VISIT: phone call in ~ 1 week  Corley Kohls B. Zenia Resides, [place], [place] Registered Dietitian [phone] (mobile)

## 2020-10-21 ENCOUNTER — Other Ambulatory Visit: Payer: Self-pay

## 2020-10-21 DIAGNOSIS — Z4582 Encounter for adjustment or removal of myringotomy device (stent) (tube): Secondary | ICD-10-CM

## 2020-10-24 ENCOUNTER — Inpatient Hospital Stay (HOSPITAL_BASED_OUTPATIENT_CLINIC_OR_DEPARTMENT_OTHER): Payer: Medicare HMO | Admitting: Internal Medicine

## 2020-10-24 ENCOUNTER — Inpatient Hospital Stay: Payer: Medicare HMO

## 2020-10-24 ENCOUNTER — Encounter: Payer: Self-pay | Admitting: Internal Medicine

## 2020-10-24 ENCOUNTER — Other Ambulatory Visit: Payer: Self-pay

## 2020-10-24 VITALS — BP 142/83 | HR 98 | Temp 97.3°F | Resp 20 | Wt 96.8 lb

## 2020-10-24 DIAGNOSIS — R7989 Other specified abnormal findings of blood chemistry: Secondary | ICD-10-CM

## 2020-10-24 DIAGNOSIS — C250 Malignant neoplasm of head of pancreas: Secondary | ICD-10-CM | POA: Diagnosis not present

## 2020-10-24 DIAGNOSIS — Z5111 Encounter for antineoplastic chemotherapy: Secondary | ICD-10-CM | POA: Diagnosis not present

## 2020-10-24 DIAGNOSIS — Z95828 Presence of other vascular implants and grafts: Secondary | ICD-10-CM

## 2020-10-24 LAB — COMPREHENSIVE METABOLIC PANEL
ALT: 64 U/L — ABNORMAL HIGH (ref 0–44)
AST: 53 U/L — ABNORMAL HIGH (ref 15–41)
Albumin: 3 g/dL — ABNORMAL LOW (ref 3.5–5.0)
Alkaline Phosphatase: 324 U/L — ABNORMAL HIGH (ref 38–126)
Anion gap: 8 (ref 5–15)
BUN: 5 mg/dL — ABNORMAL LOW (ref 8–23)
CO2: 27 mmol/L (ref 22–32)
Calcium: 8.6 mg/dL — ABNORMAL LOW (ref 8.9–10.3)
Chloride: 100 mmol/L (ref 98–111)
Creatinine, Ser: 0.55 mg/dL (ref 0.44–1.00)
GFR, Estimated: >60 mL/min (ref >60)
Glucose, Bld: 97 mg/dL (ref 70–99)
Potassium: 3.3 mmol/L — ABNORMAL LOW (ref 3.5–5.1)
Sodium: 135 mmol/L (ref 135–145)
Total Bilirubin: 2.3 mg/dL — ABNORMAL HIGH (ref 0.3–1.2)
Total Protein: 6.5 g/dL (ref 6.5–8.1)

## 2020-10-24 LAB — CBC WITH DIFFERENTIAL/PLATELET
Abs Immature Granulocytes: 0.03 10*3/uL (ref 0.00–0.07)
Basophils Absolute: 0 10*3/uL (ref 0.0–0.1)
Basophils Relative: 0 %
Eosinophils Absolute: 0 10*3/uL (ref 0.0–0.5)
Eosinophils Relative: 0 %
HCT: 31.3 % — ABNORMAL LOW (ref 36.0–46.0)
Hemoglobin: 10.2 g/dL — ABNORMAL LOW (ref 12.0–15.0)
Immature Granulocytes: 1 %
Lymphocytes Relative: 15 %
Lymphs Abs: 1 10*3/uL (ref 0.7–4.0)
MCH: 29.8 pg (ref 26.0–34.0)
MCHC: 32.6 g/dL (ref 30.0–36.0)
MCV: 91.5 fL (ref 80.0–100.0)
Monocytes Absolute: 0.5 10*3/uL (ref 0.1–1.0)
Monocytes Relative: 8 %
Neutro Abs: 4.9 10*3/uL (ref 1.7–7.7)
Neutrophils Relative %: 76 %
Platelets: 48 10*3/uL — ABNORMAL LOW (ref 150–400)
RBC: 3.42 MIL/uL — ABNORMAL LOW (ref 3.87–5.11)
RDW: 14.4 % (ref 11.5–15.5)
WBC: 6.5 10*3/uL (ref 4.0–10.5)
nRBC: 0 % (ref 0.0–0.2)

## 2020-10-24 MED ORDER — HEPARIN SOD (PORK) LOCK FLUSH 100 UNIT/ML IV SOLN
500.0000 [IU] | Freq: Once | INTRAVENOUS | Status: AC
  Administered 2020-10-24: 500 [IU] via INTRAVENOUS
  Filled 2020-10-24: qty 5

## 2020-10-24 MED ORDER — HEPARIN SOD (PORK) LOCK FLUSH 100 UNIT/ML IV SOLN
INTRAVENOUS | Status: AC
  Filled 2020-10-24: qty 5

## 2020-10-24 MED ORDER — PREDNISONE 20 MG PO TABS
20.0000 mg | ORAL_TABLET | Freq: Every day | ORAL | 0 refills | Status: DC
Start: 2020-10-24 — End: 2020-11-29

## 2020-10-24 MED ORDER — SODIUM CHLORIDE 0.9 % IV SOLN
Freq: Once | INTRAVENOUS | Status: AC
  Filled 2020-10-24: qty 250

## 2020-10-24 NOTE — Patient Instructions (Signed)
[facility] ONCOLOGY  Discharge Instructions: Thank you for choosing [facility] to provide your oncology and hematology care.  If you have a lab appointment with the [facility], please go directly to the [facility] and check in at the registration area.  Wear comfortable clothing and clothing appropriate for easy access to any Portacath or PICC line.   We strive to give you quality time with your provider. You may need to reschedule your appointment if you arrive late (15 or more minutes).  Arriving late affects you and other patients whose appointments are after yours.  Also, if you miss three or more appointments without notifying the office, you may be dismissed from the clinic at the provider's discretion.      For prescription refill requests, have your pharmacy contact our office and allow 72 hours for refills to be completed.    Today you received the following chemotherapy and/or immunotherapy agents IV fluids      To help prevent nausea and vomiting after your treatment, we encourage you to take your nausea medication as directed.  BELOW ARE SYMPTOMS THAT SHOULD BE REPORTED IMMEDIATELY: *FEVER GREATER THAN 100.4 F (38 C) OR HIGHER *CHILLS OR SWEATING *NAUSEA AND VOMITING THAT IS NOT CONTROLLED WITH YOUR NAUSEA MEDICATION *UNUSUAL SHORTNESS OF BREATH *UNUSUAL BRUISING OR BLEEDING *URINARY PROBLEMS (pain or burning when urinating, or frequent urination) *BOWEL PROBLEMS (unusual diarrhea, constipation, pain near the anus) TENDERNESS IN MOUTH AND THROAT WITH OR WITHOUT PRESENCE OF ULCERS (sore throat, sores in mouth, or a toothache) UNUSUAL RASH, SWELLING OR PAIN  UNUSUAL VAGINAL DISCHARGE OR ITCHING   Items with * indicate a potential emergency and should be followed up as soon as possible or go to the Emergency Department if any problems should occur.  Please show the CHEMOTHERAPY ALERT CARD or IMMUNOTHERAPY ALERT CARD at check-in  to the Emergency Department and triage nurse.  Should you have questions after your visit or need to cancel or reschedule your appointment, please contact [facility]  [phone] and follow the prompts.  Office hours are 8:00 a.m. to 4:30 p.m. Monday - Friday. Please note that voicemails left after 4:00 p.m. may not be returned until the following business day.  We are closed weekends and major holidays. You have access to a nurse at all times for urgent questions. Please call the main number to the clinic [phone] and follow the prompts.  For any non-urgent questions, you may also contact your provider using MyChart. We now offer e-Visits for anyone 56 and older to request care online for non-urgent symptoms. For details visit [email or web address].   Also download the MyChart app! Go to the app store, search "MyChart", open the app, select [facility], and log in with your MyChart username and password.  Due to Covid, a mask is required upon entering the hospital/clinic. If you do not have a mask, one will be given to you upon arrival. For doctor visits, patients may have 1 support person aged 10 or older with them. For treatment visits, patients cannot have anyone with them due to current Covid guidelines and our immunocompromised population.   Dehydration, Adult Dehydration is condition in which there is not enough water or other fluids in the body. This happens when a person loses more fluids than he or she takes in. Important body parts cannot work right without the right amount of fluids. Anyloss of fluids from the body can cause dehydration. Dehydration  can be mild, worse, or very bad. It should be treated right away tokeep it from getting very bad. What are the causes? This condition may be caused by: Conditions that cause loss of water or other fluids, such as: Watery poop (diarrhea). Vomiting. Sweating a lot. Peeing (urinating) a lot. Not  drinking enough fluids, especially when you: Are ill. Are doing things that take a lot of energy to do. Other illnesses and conditions, such as fever or infection. Certain medicines, such as medicines that take extra fluid out of the body (diuretics). Lack of safe drinking water. Not being able to get enough water and food. What increases the risk? The following factors may make you more likely to develop this condition: Having a long-term (chronic) illness that has not been treated the right way, such as: Diabetes. Heart disease. Kidney disease. Being 12 years of age or older. Having a disability. Living in a place that is high above the ground or sea (high in altitude). The thinner, dried air causes more fluid loss. Doing exercises that put stress on your body for a long time. What are the signs or symptoms? Symptoms of dehydration depend on how bad it is. Mild or worse dehydration Thirst. Dry lips or dry mouth. Feeling dizzy or light-headed, especially when you stand up from sitting. Muscle cramps. Your body making: Dark pee (urine). Pee may be the color of tea. Less pee than normal. Less tears than normal. Headache. Very bad dehydration Changes in skin. Skin may: Be cold to the touch (clammy). Be blotchy or pale. Not go back to normal right after you lightly pinch it and let it go. Little or no tears, pee, or sweat. Changes in vital signs, such as: Fast breathing. Low blood pressure. Weak pulse. Pulse that is more than 100 beats a minute when you are sitting still. Other changes, such as: Feeling very thirsty. Eyes that look hollow (sunken). Cold hands and feet. Being mixed up (confused). Being very tired (lethargic) or having trouble waking from sleep. Short-term weight loss. Loss of consciousness. How is this treated? Treatment for this condition depends on how bad it is. Treatment should start right away. Do not wait until your condition gets very bad. Very bad  dehydration is an emergency. You will need to go to a hospital. Mild or worse dehydration can be treated at home. You may be asked to: Drink more fluids. Drink an oral rehydration solution (ORS). This drink helps get the right amounts of fluids and salts and minerals in the blood (electrolytes). Very bad dehydration can be treated: With fluids through an IV tube. By getting normal levels of salts and minerals in your blood. This is often done by giving salts and minerals through a tube. The tube is passed through your nose and into your stomach. By treating the root cause. Follow these instructions at home: Oral rehydration solution If told by your doctor, drink an ORS: Make an ORS. Use instructions on the package. Start by drinking small amounts, about  cup (120 mL) every 5-10 minutes. Slowly drink more until you have had the amount that your doctor said to have. Eating and drinking        Drink enough clear fluid to keep your pee pale yellow. If you were told to drink an ORS, finish the ORS first. Then, start slowly drinking other clear fluids. Drink fluids such as: Water. Do not drink only water. Doing that can make the salt (sodium) level in your body get  too low. Water from ice chips you suck on. Fruit juice that you have added water to (diluted). Low-calorie sports drinks. Eat foods that have the right amounts of salts and minerals, such as: Bananas. Oranges. Potatoes. Tomatoes. Spinach. Do not drink alcohol. Avoid: Drinks that have a lot of sugar. These include: High-calorie sports drinks. Fruit juice that you did not add water to. Soda. Caffeine. Foods that are greasy or have a lot of fat or sugar. General instructions Take over-the-counter and prescription medicines only as told by your doctor. Do not take salt tablets. Doing that can make the salt level in your body get too high. Return to your normal activities as told by your doctor. Ask your doctor what  activities are safe for you. Keep all follow-up visits as told by your doctor. This is important. Contact a doctor if: You have pain in your belly (abdomen) and the pain: Gets worse. Stays in one place. You have a rash. You have a stiff neck. You get angry or annoyed (irritable) more easily than normal. You are more tired or have a harder time waking than normal. You feel: Weak or dizzy. Very thirsty. Get help right away if you have: Any symptoms of very bad dehydration. Symptoms of vomiting, such as: You cannot eat or drink without vomiting. Your vomiting gets worse or does not go away. Your vomit has blood or green stuff in it. Symptoms that get worse with treatment. A fever. A very bad headache. Problems with peeing or pooping (having a bowel movement), such as: Watery poop that gets worse or does not go away. Blood in your poop (stool). This may cause poop to look black and tarry. Not peeing in 6-8 hours. Peeing only a small amount of very dark pee in 6-8 hours. Trouble breathing. These symptoms may be an emergency. Do not wait to see if the symptoms will go away. Get medical help right away. Call your local emergency services (911 in the [place] Do not drive yourself to the hospital. Summary Dehydration is a condition in which there is not enough water or other fluids in the body. This happens when a person loses more fluids than he or she takes in. Treatment for this condition depends on how bad it is. Treatment should be started right away. Do not wait until your condition gets very bad. Drink enough clear fluid to keep your pee pale yellow. If you were told to drink an oral rehydration solution (ORS), finish the ORS first. Then, start slowly drinking other clear fluids. Take over-the-counter and prescription medicines only as told by your doctor. Get help right away if you have any symptoms of very bad dehydration. This information is not intended to replace advice given to  you by your health care provider. Make sure you discuss any questions you have with your healthcare provider. Document Revised: 10/30/2018 Document Reviewed: 10/30/2018 Elsevier Patient Education  [place].

## 2020-10-24 NOTE — Progress Notes (Signed)
[place] NOTE  Patient Care Team: Erma Heritage, MD as PCP - General (Family Medicine) Rico Junker, RN as Registered Nurse Theodore Demark, RN as Registered Nurse Ezzard Standing, PA-C as Physician Assistant (Gastroenterology) Bary Castilla, Forest Gleason, MD (General Surgery) Clent Jacks, RN as Oncology Nurse Navigator Cammie Sickle, MD as Consulting Physician (Hematology and Oncology)  CHIEF COMPLAINTS/PURPOSE OF CONSULTATION: Pancreatic cancer  #  Oncology History Overview Note  # MAY 2022-pancreatic adenocarcinoma [obstructive jaundice-]MRI-18 mm pancreatic head mass; EUS [[facility]; Dr.Spaete]-abutment of portal vein/SMV; no lymphadenopathy noted; May 5 CA 19-9-163; s/p stenting -May 25th-47.   #Obstructive jaundice-s/p ERCP stenting [Dr.Wohl]  MAY 2022- ENDOSONOGRAPHIC FINDING: : A round mass was identified in the pancreatic head.  The mass was hypoechoic. The mass measured 16 mm by 17  mm in maximal cross-sectional diameter. The outer  margins were irregular. There was sonographic evidence  suggesting invasion into the portal vein (manifested  by abutment) and the superior mesenteric vein  (manifested by abutment).   #Pancreatic adenocarcinoma pT1 however on EUS/CT [[facility]. Dr.Shah]-concerning for tumor abutting the celiac axis /common hepatic artery /portal vein/superior mesenteric vein; without any metastatic disease.  Borderline resectable Elevated CA 19-9.  [S/p evaluation at [place]  # June 9th- FOLFIRINOX cycle #1; discontinued because of poor GI tolerance  # &/01-2021-gemcitabine Abraxane ['100mg'$ /2] day 1 day 815  # July 11th-gemcitabine and Abraxane day.  ---------------------------------------------------------      Malignant neoplasm of head of pancreas ([place])  08/24/2020 Initial Diagnosis   Malignant neoplasm of head of pancreas ([place])   09/09/2020 - 09/12/2020 Chemotherapy          10/10/2020 -  Chemotherapy     Patient is on Treatment Plan: PANCREATIC ABRAXANE / GEMCITABINE D1,8,15 Q28D          HISTORY OF PRESENTING ILLNESS:  Shirley Barton 70 y.o.  female with a history of COPD; chronic abdominal pain-locally advanced pancreatic cancer with involvement of the vessels currently on neoadjuvant chemotherapy  Patient is currently on gemcitabine Abraxane-chemotherapy.  As per patient tolerance much improved on current systemic therapy.   Patient complains of poor taste.  Chronic abdominal pain not any worse.  No significant nausea or vomiting.  Chronic mild diarrhea.   Review of Systems  Constitutional:  Positive for malaise/fatigue and weight loss. Negative for chills, diaphoresis and fever.  HENT:  Negative for nosebleeds and sore throat.   Eyes:  Negative for double vision.  Respiratory:  Positive for shortness of breath. Negative for cough, hemoptysis, sputum production and wheezing.   Cardiovascular:  Negative for chest pain, palpitations, orthopnea and leg swelling.  Gastrointestinal:  Positive for abdominal pain and diarrhea. Negative for blood in stool, constipation, heartburn, melena and vomiting.  Genitourinary:  Negative for dysuria, frequency and urgency.  Musculoskeletal:  Negative for back pain and joint pain.  Skin: Negative.  Negative for itching and rash.  Neurological:  Negative for dizziness, tingling, focal weakness, weakness and headaches.  Endo/Heme/Allergies:  Does not bruise/bleed easily.  Psychiatric/Behavioral:  Negative for depression. The patient is not nervous/anxious and does not have insomnia.     MEDICAL HISTORY:  Past Medical History:  Diagnosis Date   Abdominal pain, generalized    Anxiety    Atypical chest pain    Back pain    Basal cell carcinoma of skin 2013   resected from Left scalp area.    COPD (chronic obstructive pulmonary disease) ([place])  Depression    DOE (dyspnea on exertion)    GERD (gastroesophageal reflux disease)    Hypertension     Pancreatic cancer ([place])    Shortness of breath dyspnea     SURGICAL HISTORY: Past Surgical History:  Procedure Laterality Date   BASAL CELL CARCINOMA EXCISION  07-10-11   CARPAL TUNNEL RELEASE Left 90s   CHOLECYSTECTOMY N/A 09/16/2015   Procedure: LAPAROSCOPIC CHOLECYSTECTOMY WITH INTRAOPERATIVE CHOLANGIOGRAM;  Surgeon: Robert Bellow, MD;  Location: [facility] ORS;  Service: General;  Laterality: N/A;   COLONOSCOPY  1990"s   [place]   CORONARY ARTERY BYPASS GRAFT     ERCP N/A 08/05/2020   Procedure: ENDOSCOPIC RETROGRADE CHOLANGIOPANCREATOGRAPHY (ERCP);  Surgeon: Lucilla Lame, MD;  Location: [facility] ENDOSCOPY;  Service: Endoscopy;  Laterality: N/A;   ESOPHAGOGASTRODUODENOSCOPY (EGD) WITH PROPOFOL N/A 02/05/2017   Procedure: ESOPHAGOGASTRODUODENOSCOPY (EGD) WITH PROPOFOL;  Surgeon: Lollie Sails, MD;  Location: [facility] ENDOSCOPY;  Service: Endoscopy;  Laterality: N/A;   PERIPHERAL VASCULAR CATHETERIZATION Bilateral 12/27/2015   Procedure: Lower Extremity Angiography;  Surgeon: Katha Cabal, MD;  Location: [place] CV LAB;  Service: Cardiovascular;  Laterality: Bilateral;   PORTA CATH INSERTION N/A 09/08/2020   Procedure: PORTA CATH INSERTION;  Surgeon: Algernon Huxley, MD;  Location: [place] CV LAB;  Service: Cardiovascular;  Laterality: N/A;   TONSILLECTOMY      SOCIAL HISTORY: Social History   Socioeconomic History   Marital status: Divorced    Spouse name: Not on file   Number of children: Not on file   Years of education: Not on file   Highest education level: Not on file  Occupational History   Occupation: retired  Tobacco Use   Smoking status: Former    Types: Cigarettes    Quit date: 03/02/2012    Years since quitting: 8.6   Smokeless tobacco: Never  Vaping Use   Vaping Use: Never used  Substance and Sexual Activity   Alcohol use: No    Alcohol/week: 0.0 standard drinks   Drug use: No   Sexual activity: Not on file  Other Topics Concern   Not on file   Social History Narrative   [place]; self; smoker- quit July 10, 2011; daughter died of leukemia-2020. 2 sons [[place]; [place]]; used to be caregiver/ retd.    Social Determinants of Health   Financial Resource Strain: Not on file  Food Insecurity: Not on file  Transportation Needs: Not on file  Physical Activity: Not on file  Stress: Not on file  Social Connections: Not on file  Intimate Partner Violence: Not on file    FAMILY HISTORY: Family History  Problem Relation Age of Onset   Cerebral palsy Daughter    Colon polyps Sister    Leukemia Daughter    Depression Son    Breast cancer Neg Hx     ALLERGIES:  is allergic to tramadol and penicillins.  MEDICATIONS:  Current Outpatient Medications  Medication Sig Dispense Refill   predniSONE (DELTASONE) 20 MG tablet Take 1 tablet (20 mg total) by mouth daily with breakfast. Once a day with food. 30 tablet 0   acetaminophen (TYLENOL) 325 MG tablet Take 650 mg by mouth daily.     b complex vitamins capsule Take 1 capsule by mouth daily.     B Complex-C-Folic Acid (B COMPLEX-VITAMIN C-FOLIC ACID) 1 MG tablet Take 1 tablet by mouth daily with breakfast. 30 tablet 0   cetirizine (ZYRTEC) 10 MG tablet Take 10 mg by mouth daily.     diphenoxylate-atropine (  LOMOTIL) 2.5-0.025 MG tablet Take 1 tablet by mouth 4 (four) times daily as needed for diarrhea or loose stools. 60 tablet 1   Fluticasone-Salmeterol (ADVAIR) 100-50 MCG/DOSE AEPB Inhale 1 puff into the lungs 2 (two) times daily.     HYDROcodone-acetaminophen (NORCO) 5-325 MG tablet Take 1 tablet by mouth every 6 (six) hours as needed for moderate pain. 45 tablet 0   lidocaine-prilocaine (EMLA) cream Apply 30 -45 mins prior to port access. 30 g 0   magic mouthwash w/lidocaine SOLN Take 10 mLs by mouth every 2 (two) hours as needed for mouth pain. (Patient not taking: No sig reported) 360 mL 0   ondansetron (ZOFRAN ODT) 4 MG disintegrating tablet Take 1 tablet (4 mg total) by mouth every 6  (six) hours as needed for nausea or vomiting. (Patient not taking: Reported on 10/10/2020) 20 tablet 0   ondansetron (ZOFRAN) 8 MG tablet One pill every 8 hours as needed for nausea/vomitting. 40 tablet 3   pantoprazole (PROTONIX) 40 MG tablet Take by mouth.     potassium chloride SA (KLOR-CON) 20 MEQ tablet Take 1 tablet (20 mEq total) by mouth 2 (two) times daily. 60 tablet 0   prochlorperazine (COMPAZINE) 10 MG tablet Take 1 tablet (10 mg total) by mouth every 6 (six) hours as needed for nausea or vomiting. 40 tablet 3   No current facility-administered medications for this visit.      Marland Kitchen  PHYSICAL EXAMINATION: ECOG PERFORMANCE STATUS: 0 - Asymptomatic  Vitals:   10/24/20 0915  BP: (!) 142/83  Pulse: 98  Resp: 20  Temp: (!) 97.3 F (36.3 C)   Filed Weights   10/24/20 0915  Weight: 96 lb 12.8 oz (43.9 kg)    Physical Exam Constitutional:      Comments: Caucasian female patient ambulating independently.  Accompanied by her son.  HENT:     Head: Normocephalic and atraumatic.     Mouth/Throat:     Pharynx: No oropharyngeal exudate.  Eyes:     Pupils: Pupils are equal, round, and reactive to light.  Cardiovascular:     Rate and Rhythm: Normal rate and regular rhythm.  Pulmonary:     Effort: No respiratory distress.     Breath sounds: No wheezing.     Comments: Decreased air entry bilaterally.  No wheeze or crackles Abdominal:     General: Bowel sounds are normal. There is no distension.     Palpations: Abdomen is soft. There is no mass.     Tenderness: no abdominal tenderness There is no guarding or rebound.  Musculoskeletal:        General: No tenderness. Normal range of motion.     Cervical back: Normal range of motion and neck supple.  Skin:    General: Skin is warm.  Neurological:     Mental Status: She is alert and oriented to person, place, and time.  Psychiatric:        Mood and Affect: Affect normal.     LABORATORY DATA:  I have reviewed the data as  listed Lab Results  Component Value Date   WBC 6.5 10/24/2020   HGB 10.2 (L) 10/24/2020   HCT 31.3 (L) 10/24/2020   MCV 91.5 10/24/2020   PLT 48 (L) 10/24/2020   Recent Labs    08/04/20 1138 08/06/20 0457 10/17/20 0904 10/20/20 1302 10/24/20 0856  NA  --    < > 137 135 135  K  --    < > 3.1* 3.4* 3.3*  CL  --    < > 101 100 100  CO2  --    < > '26 26 27  '$ GLUCOSE  --    < > 95 170* 97  BUN  --    < > 9 26* 5*  CREATININE  --    < > 0.52 0.58 0.55  CALCIUM  --    < > 9.1 8.6* 8.6*  GFRNONAA  --    < > >60 >60 >60  PROT  --    < > 7.0 6.7 6.5  ALBUMIN  --    < > 3.3* 3.0* 3.0*  AST  --    < > 44* 110* 53*  ALT  --    < > 53* 135* 64*  ALKPHOS  --    < > 271* 303* 324*  BILITOT  --    < > 1.9* 2.7* 2.3*  BILIDIR 3.7*  --   --   --   --    < > = values in this interval not displayed.    RADIOGRAPHIC STUDIES: I have personally reviewed the radiological images as listed and agreed with the findings in the report. US ABDOMEN LIMITED RUQ (LIVER/GB)  Result Date: 10/25/2020 CLINICAL DATA:  Malignant neoplasm of head of pancreas. LFT elevation. EXAM: ULTRASOUND ABDOMEN LIMITED RIGHT UPPER QUADRANT COMPARISON:  CT 08/04/2020 FINDINGS: Gallbladder: Prior cholecystectomy Common bile duct: Diameter: Dilated common bile duct measuring 9 mm. Common bile duct stent visualized. Liver: Intrahepatic biliary ductal dilatation. No focal hepatic abnormality. Normal echotexture. Portal vein is patent on color Doppler imaging with normal direction of blood flow towards the liver. Other: None. IMPRESSION: Intrahepatic and extrahepatic biliary ductal dilatation, similar to prior CT. Biliary stent partially visualized. Electronically Signed   By: Rolm Baptise M.D.   On: 10/25/2020 09:27     ASSESSMENT & PLAN:   Malignant neoplasm of head of pancreas [facility]) #Pancreatic adenocarcinoma pT1 however on EUS/CT [[facility]. Dr.Shah]-concerning for tumor abutting the celiac axis /common hepatic artery /portal  vein/superior mesenteric vein; without any metastatic disease.  Borderline resectable Elevated CA 19-9. Currently gemcitabine Abraxane [day 1 day 8-day 15; q.  28 days]   # HOLD  with cycle #1 gemcitabine Abraxane day 15 today; Labs today reviewed;  UNacceptable for treatment today- platelets- 48.   # weight loss: poor taste/apetite- s/p Joli; awaiting repeat appt this week.   # Chronic Diarrhea/adominal pain: Continue Imodium/Lomotil-currently at baseline bowel movements; continue prn nacrotics.   # Elevated LFTs-suspect secondary to chemotherapy  Vs  biliary obstruction [less likley]- plan for stente xcahnage on 8/18- Dr.Wohl.   #COPD/currently quit smoking not on home O2; on inhalers-STABLE.   # DISPOSITION:  # Take hydrocortsone off the allergy list # HOLD chemo today; IVFs today -1 lit  # US abdomen- STAT  # appts as planned- -------------------------------------  # DISPOSITION:  # follow up in 2 weeks- MD; labs- cbc/cmp; ca-19-9;D-1 Gem-Abraxane- Dr.B  Addendum: Discussed with Dr. Jennefer Bravo of the ultrasound-stable dilatation of the biliary system-inconclusive if there is worsening biliary obstruction versus LFTs elevated to chemotherapy.  Discussed with patient the results of ultrasound/plan with Dr. Verl Blalock.   All questions were answered. The patient knows to call the clinic with any problems, questions or concerns.    Cammie Sickle, MD 11/03/2020 6:26 AM

## 2020-10-24 NOTE — Assessment & Plan Note (Addendum)
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 Dr.Shah]-concerning for tumor abutting the celiac axis /common hepatic artery /portal vein/superior mesenteric vein; without any metastatic disease.  Borderline resectable Elevated CA 19-9. Currently gemcitabine Abraxane [day 1 day 8-day 15; q.  28 days]   # HOLD  with cycle #1 gemcitabine Abraxane day 15 today; Labs today reviewed;  UNacceptable for treatment today- platelets- 48.   # weight loss: poor taste/apetite- s/p Joli; awaiting repeat appt this week.   # Chronic Diarrhea/adominal pain: Continue Imodium/Lomotil-currently at baseline bowel movements; continue prn nacrotics.   # Elevated LFTs-suspect secondary to chemotherapy  Vs  biliary obstruction [less likley]- plan for stente xcahnage on 8/18- Dr.Wohl.   #COPD/currently quit smoking not on home O2; on inhalers-STABLE.   # DISPOSITION:  # Take hydrocortsone off the allergy list # HOLD chemo today; IVFs today -1 lit  # US abdomen- STAT  # appts as planned- -------------------------------------  # DISPOSITION:  # follow up in 2 weeks- MD; labs- cbc/cmp; ca-19-9;D-1 Gem-Abraxane- Dr.B  Addendum: Discussed with Dr. Jennefer Bravo of the ultrasound-stable dilatation of the biliary system-inconclusive if there is worsening biliary obstruction versus LFTs elevated to chemotherapy.  Discussed with patient the results of ultrasound/plan with Dr. Verl Blalock.

## 2020-10-25 ENCOUNTER — Ambulatory Visit
Admission: RE | Admit: 2020-10-25 | Discharge: 2020-10-25 | Disposition: A | Discharge status: Home / Self Care | Payer: Medicare HMO | Source: Ambulatory Visit | Attending: Internal Medicine | Admitting: Internal Medicine

## 2020-10-25 DIAGNOSIS — C250 Malignant neoplasm of head of pancreas: Secondary | ICD-10-CM

## 2020-10-25 DIAGNOSIS — R7989 Other specified abnormal findings of blood chemistry: Secondary | ICD-10-CM | POA: Insufficient documentation

## 2020-10-25 IMAGING — US US ABDOMEN LIMITED
1 series · 14 of 25 positions shown · non-contrast
Comparison: CT [DATE]

CLINICAL DATA: Malignant neoplasm of head of pancreas. LFT
elevation.

EXAM:
ULTRASOUND ABDOMEN LIMITED RIGHT UPPER QUADRANT

[Series 1: us abdomen limited ruq (liver/gb) · 14 of 49 slices shown]
[im 1/49]
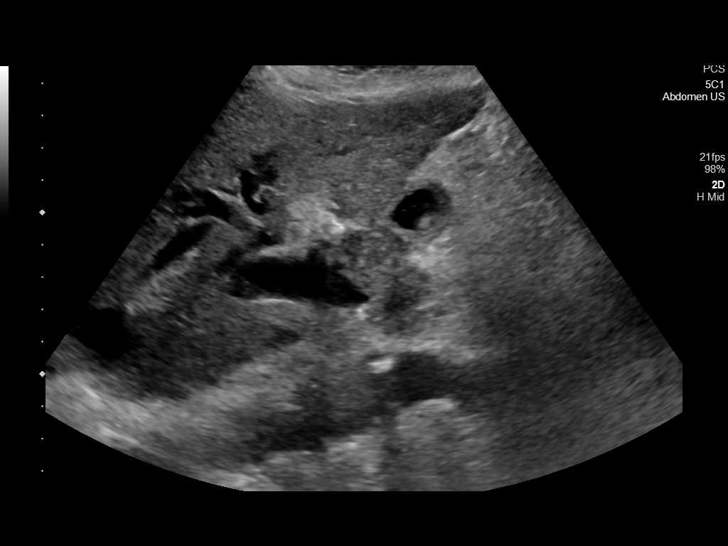
[im 5/49]
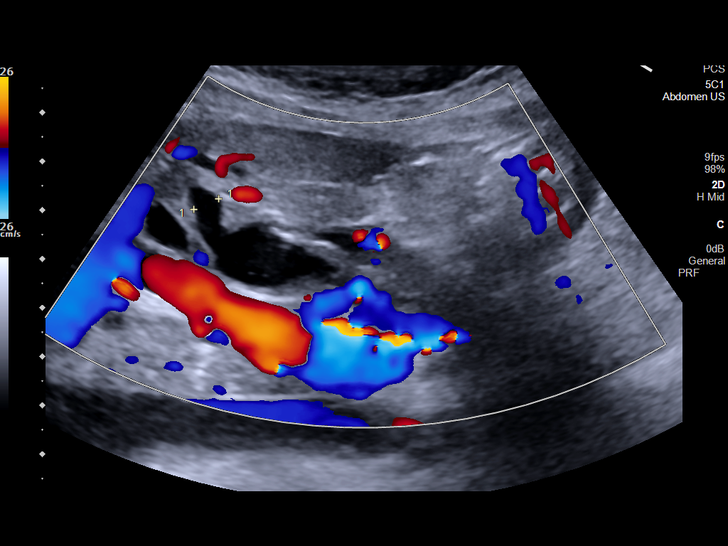
[im 9/49]
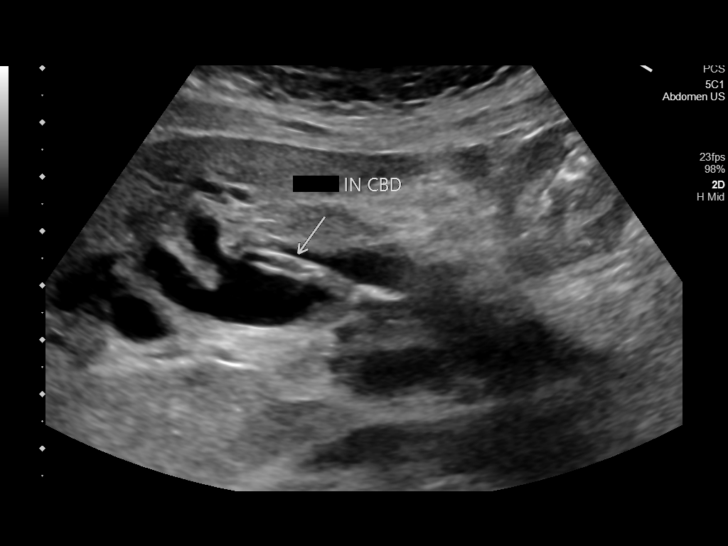
[im 13/49]
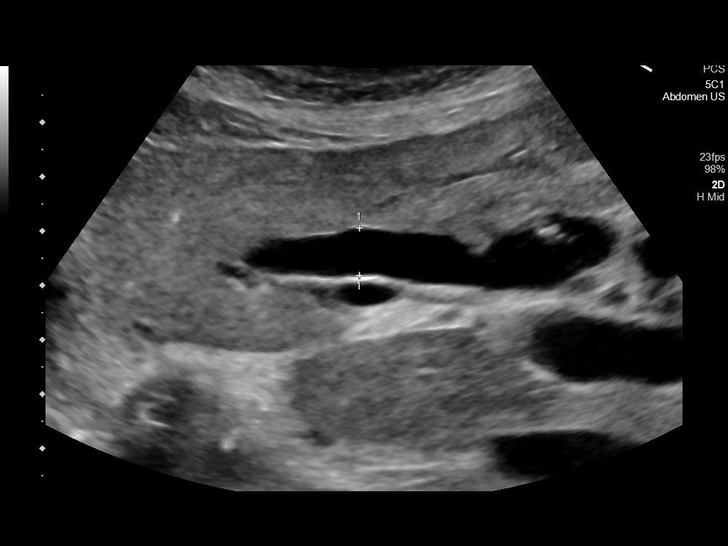
[im 17/49]
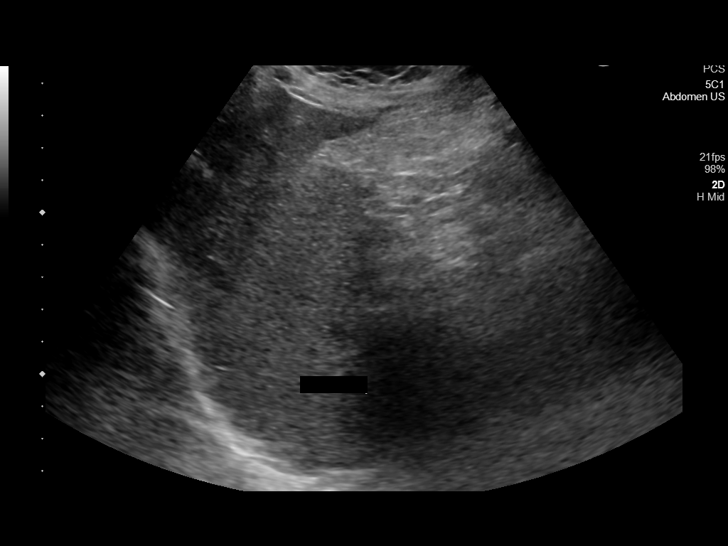
[im 19/49]
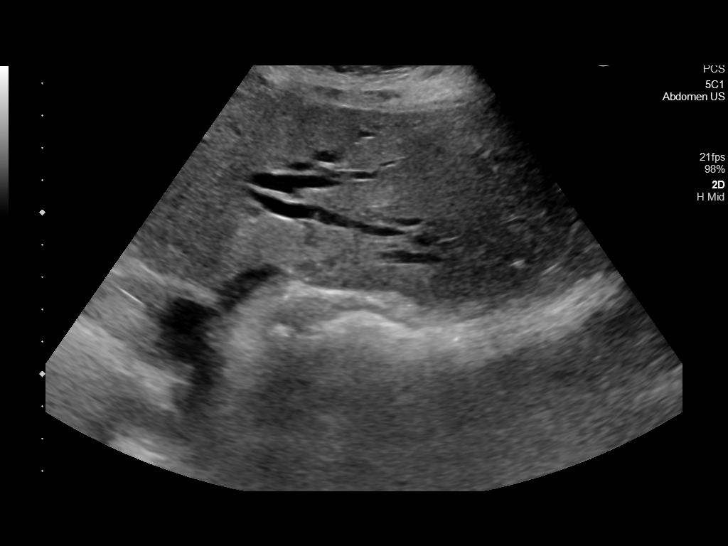
[im 23/49]
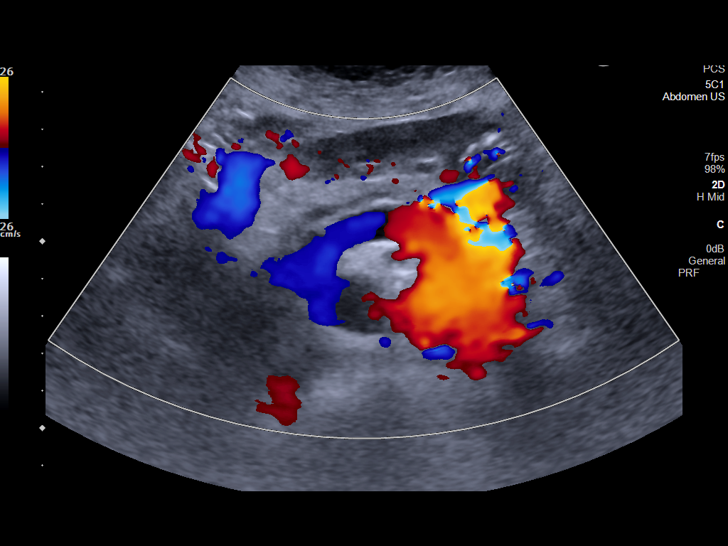
[im 27/49]
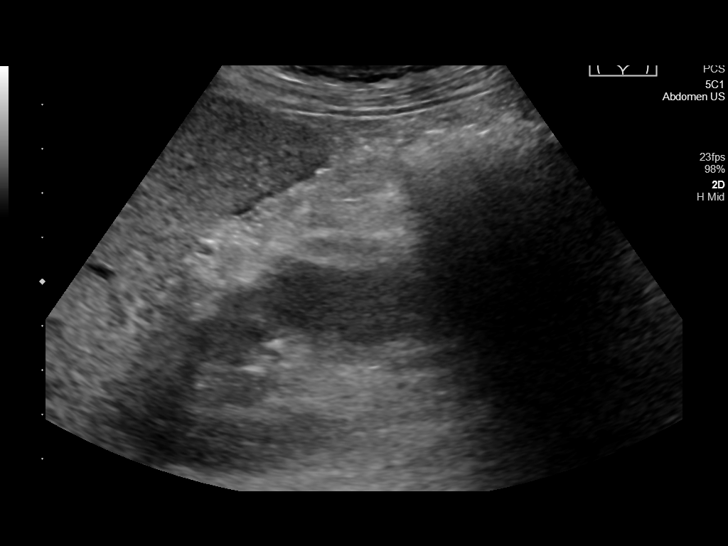
[im 31/49]
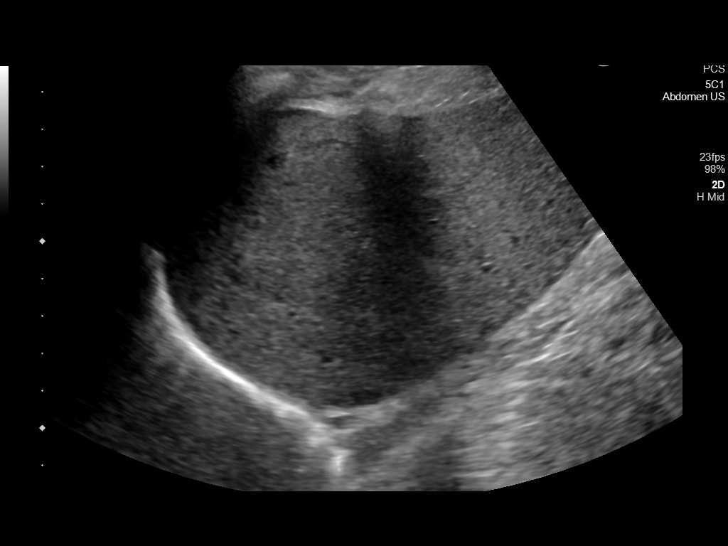
[im 33/49]
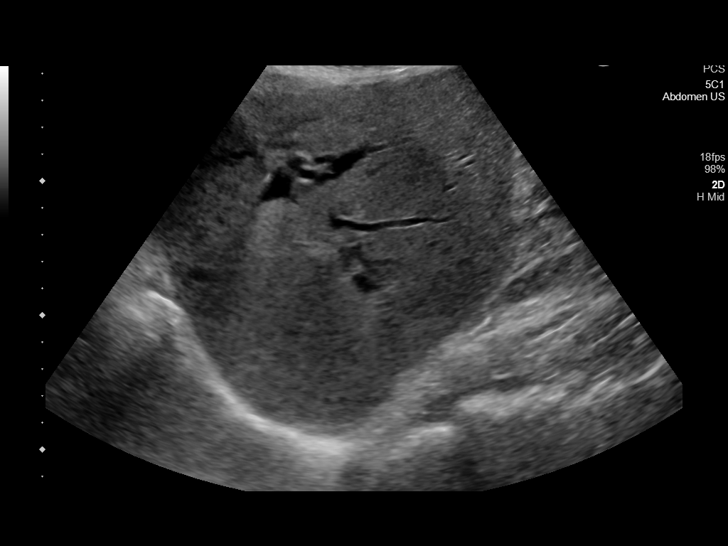
[im 37/49]
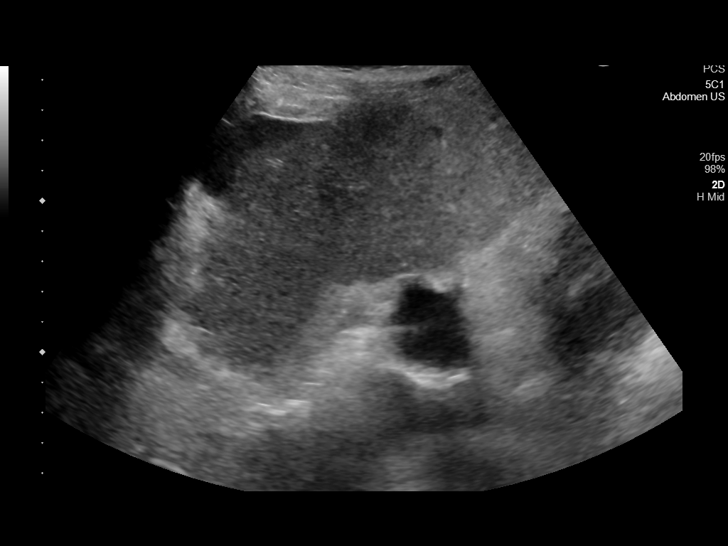
[im 41/49]
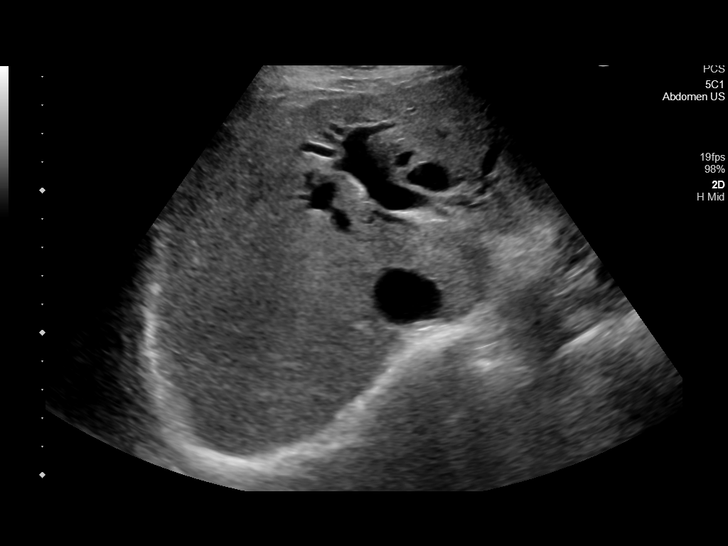
[im 45/49]
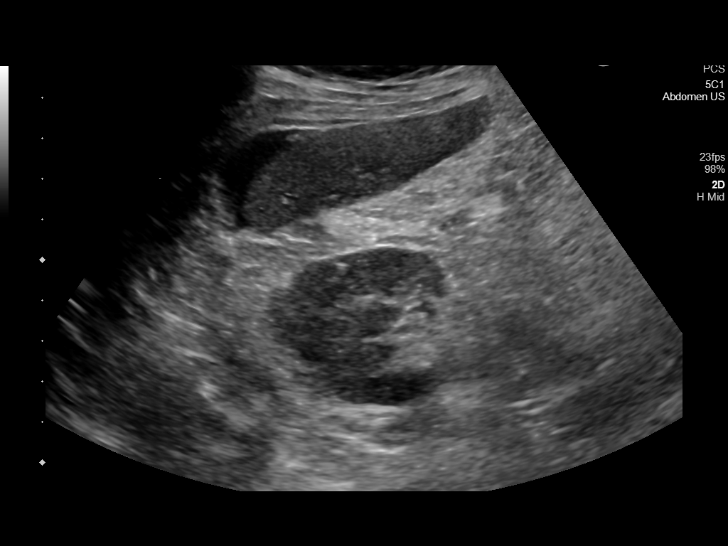
[im 49/49]
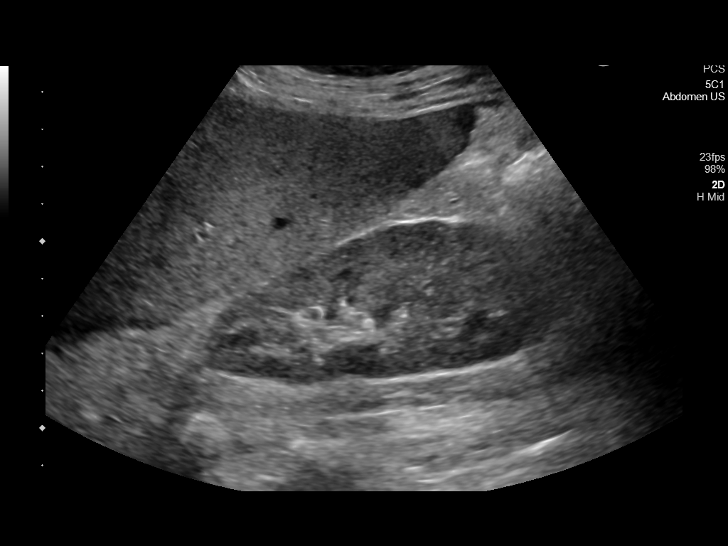

[14 of 25 positions shown; findings below may reference images not displayed]

FINDINGS: Gallbladder:

Prior cholecystectomy

Common bile duct:

Diameter: Dilated common bile duct measuring 9 mm. Common bile duct
stent visualized.

Liver:

Intrahepatic biliary ductal dilatation. No focal hepatic
abnormality. Normal echotexture. Portal vein is patent on color
Doppler imaging with normal direction of blood flow towards the
liver.

Other: None.
IMPRESSION: Intrahepatic and extrahepatic biliary ductal dilatation, similar to
prior CT. Biliary stent partially visualized.

## 2020-10-27 ENCOUNTER — Ambulatory Visit: Payer: Medicare HMO

## 2020-10-27 ENCOUNTER — Telehealth: Payer: Self-pay

## 2020-10-27 NOTE — Telephone Encounter (Signed)
Nutrition  Called patient for nutrition follow-up.  No answer. Left message with call back number  Meliyah Simon B. Kailany Dinunzio, RD, LDN Registered Dietitian [phone] (mobile)  

## 2020-10-27 NOTE — Progress Notes (Signed)
Nutrition Follow-up:    Patient with pancreatic cancer.  Patient unable to tolerate folforinox.  Treatment switched to gemcitabine and abraxane.  Treatment held on 7/25 due to low platelets.    Patient returned RD's phone call.  Patient reports that her appetite is a little bit better.  Has been taking steroids.  Reports has been drinking gatorade and water.  Tried the orange enterade and it was ok but the vanilla flavor made her gag.  Reports that today she has eaten egg, some chips.  Planning to go eat some pinto beans, greens and cornbread.  Has been trying to drink 2 ensure shakes each day.  Says that she has not had any nausea or diarrhea.    Medications: reviewed  Labs: reviewed  Anthropometrics:   Weight 96 lb on 7/25  97 lb 8 oz on 7/18  98 lb on 7/11 100 lb on 6/20 103 lb on 6/9   NUTRITION DIAGNOSIS: Inadequate oral intake continues  Severe malnutrition continues   INTERVENTION:  Continue ensure shakes 2-3 per day if able Continue high calorie, high protein foods Hold off on enterade drink.       MONITORING, EVALUATION, GOAL: weight trends, intake   NEXT VISIT: to be determined  Shirley Barton, [place], [place] Registered Dietitian [phone] (mobile)

## 2020-11-03 ENCOUNTER — Encounter: Payer: Self-pay | Admitting: Internal Medicine

## 2020-11-07 ENCOUNTER — Telehealth: Payer: Self-pay | Admitting: *Deleted

## 2020-11-07 NOTE — Telephone Encounter (Signed)
Apts have been rescheduled per md order

## 2020-11-07 NOTE — Telephone Encounter (Signed)
-----   Message from Cammie Sickle, MD sent at 11/04/2020  9:59 PM EDT ----- Regarding: re-schedule appt. Ginger-Thanks for letting me know.  C- please re-schedule the appt to week of 15th AUG [given the plan for ERCP].  GB  ----- Message ----- From: [place]ie [place], CMA Sent: 11/03/2020   4:07 PM EDT To: Cammie Sickle, MD  Hi Dr. Rogue Bussing,  Dr. Allen Norris is planning on doing this pt's ERCP stent exchange next Thursday, August 11th. Pt stated she is having her chemo treatment on the 10th. Dr. Allen Norris would like her to wait until after the 11th to have the chemo as she will be at a greater risk of infection. Please let me know if this is acceptable.   Thank you!  Ginger Feldpausch, CMA

## 2020-11-08 ENCOUNTER — Other Ambulatory Visit: Payer: Self-pay | Admitting: Internal Medicine

## 2020-11-08 DIAGNOSIS — E876 Hypokalemia: Secondary | ICD-10-CM

## 2020-11-08 NOTE — Telephone Encounter (Signed)
  Component Ref Range & Units 2 wk ago  (10/24/20) 2 wk ago  (10/20/20) 3 wk ago  (10/17/20) 4 wk ago  (10/10/20) 1 mo ago  (09/26/20)  Potassium 3.5 - 5.1 mmol/L 3.3 Low   3.4 Low   3.1 Low   4.1  3.0

## 2020-11-09 ENCOUNTER — Inpatient Hospital Stay: Payer: Medicare HMO

## 2020-11-09 ENCOUNTER — Inpatient Hospital Stay: Payer: Medicare HMO | Admitting: Internal Medicine

## 2020-11-09 ENCOUNTER — Encounter: Payer: Self-pay | Admitting: Internal Medicine

## 2020-11-10 ENCOUNTER — Other Ambulatory Visit: Payer: Self-pay

## 2020-11-10 ENCOUNTER — Ambulatory Visit: Payer: Medicare HMO | Admitting: Anesthesiology

## 2020-11-10 ENCOUNTER — Encounter
Admission: RE | Disposition: A | Discharge status: Home / Self Care | Payer: Self-pay | Source: Home / Self Care | Attending: Gastroenterology

## 2020-11-10 ENCOUNTER — Encounter: Payer: Self-pay | Admitting: Gastroenterology

## 2020-11-10 ENCOUNTER — Ambulatory Visit: Payer: Medicare HMO

## 2020-11-10 ENCOUNTER — Ambulatory Visit
Admission: RE | Admit: 2020-11-10 | Discharge: 2020-11-10 | Disposition: A | Discharge status: Home / Self Care | Payer: Medicare HMO | Attending: Gastroenterology | Admitting: Gastroenterology

## 2020-11-10 DIAGNOSIS — K831 Obstruction of bile duct: Secondary | ICD-10-CM | POA: Diagnosis not present

## 2020-11-10 DIAGNOSIS — Z87891 Personal history of nicotine dependence: Secondary | ICD-10-CM | POA: Insufficient documentation

## 2020-11-10 DIAGNOSIS — Z7951 Long term (current) use of inhaled steroids: Secondary | ICD-10-CM | POA: Diagnosis not present

## 2020-11-10 DIAGNOSIS — Z880 Allergy status to penicillin: Secondary | ICD-10-CM | POA: Diagnosis not present

## 2020-11-10 DIAGNOSIS — Z79899 Other long term (current) drug therapy: Secondary | ICD-10-CM | POA: Insufficient documentation

## 2020-11-10 DIAGNOSIS — Z85828 Personal history of other malignant neoplasm of skin: Secondary | ICD-10-CM | POA: Insufficient documentation

## 2020-11-10 DIAGNOSIS — Z885 Allergy status to narcotic agent status: Secondary | ICD-10-CM | POA: Insufficient documentation

## 2020-11-10 DIAGNOSIS — C250 Malignant neoplasm of head of pancreas: Secondary | ICD-10-CM | POA: Insufficient documentation

## 2020-11-10 DIAGNOSIS — Z888 Allergy status to other drugs, medicaments and biological substances status: Secondary | ICD-10-CM | POA: Diagnosis not present

## 2020-11-10 DIAGNOSIS — Z4659 Encounter for fitting and adjustment of other gastrointestinal appliance and device: Secondary | ICD-10-CM | POA: Insufficient documentation

## 2020-11-10 HISTORY — PX: ENDOSCOPIC RETROGRADE CHOLANGIOPANCREATOGRAPHY (ERCP) WITH PROPOFOL: SHX5810

## 2020-11-10 SURGERY — ENDOSCOPIC RETROGRADE CHOLANGIOPANCREATOGRAPHY (ERCP) WITH PROPOFOL
Anesthesia: General

## 2020-11-10 MED ORDER — PROPOFOL 10 MG/ML IV BOLUS
INTRAVENOUS | Status: DC | PRN
  Administered 2020-11-10: 70 mg via INTRAVENOUS

## 2020-11-10 MED ORDER — SODIUM CHLORIDE 0.9 % IV SOLN: INTRAVENOUS | Status: DC

## 2020-11-10 MED ORDER — PROPOFOL 500 MG/50ML IV EMUL
INTRAVENOUS | Status: DC | PRN
  Administered 2020-11-10: 120 ug/kg/min via INTRAVENOUS

## 2020-11-10 MED ORDER — DEXMEDETOMIDINE (PRECEDEX) IN NS 20 MCG/5ML (4 MCG/ML) IV SYRINGE
PREFILLED_SYRINGE | INTRAVENOUS | Status: DC | PRN
  Administered 2020-11-10: 8 ug via INTRAVENOUS

## 2020-11-10 MED ORDER — LIDOCAINE HCL (CARDIAC) PF 100 MG/5ML IV SOSY
PREFILLED_SYRINGE | INTRAVENOUS | Status: DC | PRN
  Administered 2020-11-10: 60 mg via INTRAVENOUS

## 2020-11-10 MED ORDER — LACTATED RINGERS IV SOLN: INTRAVENOUS | Status: DC

## 2020-11-10 NOTE — Anesthesia Preprocedure Evaluation (Addendum)
Anesthesia Evaluation  Patient identified by MRN, date of birth, ID band Patient awake    Reviewed: Allergy & Precautions, NPO status , Patient's Chart, lab work & pertinent test results  History of Anesthesia Complications Negative for: history of anesthetic complications  Airway Mallampati: III  TM Distance: <3 FB Neck ROM: limited    Dental  (+) Lower Dentures, Upper Dentures   Pulmonary shortness of breath and with exertion, COPD, former smoker,    Pulmonary exam normal        Cardiovascular hypertension, (-) angina+ Peripheral Vascular Disease and + DOE  Normal cardiovascular exam     Neuro/Psych PSYCHIATRIC DISORDERS negative neurological ROS     GI/Hepatic Neg liver ROS, GERD  Medicated,  Endo/Other  negative endocrine ROS  Renal/GU negative Renal ROS  negative genitourinary   Musculoskeletal   Abdominal   Peds  Hematology negative hematology ROS (+)   Anesthesia Other Findings Patient is NPO appropriate and reports no nausea or vomiting today.  Past Medical History: No date: Abdominal pain, generalized No date: Anxiety No date: Atypical chest pain No date: Back pain 2013: Basal cell carcinoma of skin     Comment:  resected from Left scalp area.  No date: COPD (chronic obstructive pulmonary disease) ([facility]) No date: Depression No date: DOE (dyspnea on exertion) No date: GERD (gastroesophageal reflux disease) No date: Hypertension No date: Pancreatic cancer ([place]) No date: Shortness of breath dyspnea  Past Surgical History: 2013: BASAL CELL CARCINOMA EXCISION 90s: CARPAL TUNNEL RELEASE; Left 09/16/2015: CHOLECYSTECTOMY; N/A     Comment:  Procedure: LAPAROSCOPIC CHOLECYSTECTOMY WITH               INTRAOPERATIVE CHOLANGIOGRAM;  Surgeon: Robert Bellow, MD;  Location: [facility] ORS;  Service: General;                Laterality: N/A; 1990"s: COLONOSCOPY     Comment:   [place] 08/05/2020: ERCP; N/A     Comment:  Procedure: ENDOSCOPIC RETROGRADE               CHOLANGIOPANCREATOGRAPHY (ERCP);  Surgeon: Lucilla Lame,               MD;  Location: [facility] ENDOSCOPY;  Service: Endoscopy;                Laterality: N/A; 02/05/2017: ESOPHAGOGASTRODUODENOSCOPY (EGD) WITH PROPOFOL; N/A     Comment:  Procedure: ESOPHAGOGASTRODUODENOSCOPY (EGD) WITH               PROPOFOL;  Surgeon: Lollie Sails, MD;  Location:               [facility] ENDOSCOPY;  Service: Endoscopy;  Laterality: N/A; 12/27/2015: PERIPHERAL VASCULAR CATHETERIZATION; Bilateral     Comment:  Procedure: Lower Extremity Angiography;  Surgeon:               Katha Cabal, MD;  Location: [place] CV LAB;                Service: Cardiovascular;  Laterality: Bilateral; 09/08/2020: PORTA CATH INSERTION; N/A     Comment:  Procedure: PORTA CATH INSERTION;  Surgeon: Algernon Huxley,              MD;  Location: [place] CV LAB;  Service:               Cardiovascular;  Laterality: N/A; No date: TONSILLECTOMY  BMI    Body Mass Index: 16.82 kg/m      Reproductive/Obstetrics negative OB ROS                            Anesthesia Physical Anesthesia Plan  ASA: 3  Anesthesia Plan: General   Post-op Pain Management:    Induction: Intravenous  PONV Risk Score and Plan: Propofol infusion and TIVA  Airway Management Planned: Natural Airway and Nasal Cannula  Additional Equipment:   Intra-op Plan:   Post-operative Plan:   Informed Consent: I have reviewed the patients History and Physical, chart, labs and discussed the procedure including the risks, benefits and alternatives for the proposed anesthesia with the patient or authorized representative who has indicated his/her understanding and acceptance.     Dental Advisory Given  Plan Discussed with: Anesthesiologist, CRNA and Surgeon  Anesthesia Plan Comments: (Patient consented for risks of anesthesia  including but not limited to:  - adverse reactions to medications - risk of airway placement if required - damage to eyes, teeth, lips or other oral mucosa - nerve damage due to positioning  - sore throat or hoarseness - Damage to heart, brain, nerves, lungs, other parts of body or loss of life  Patient voiced understanding.)        Anesthesia Quick Evaluation

## 2020-11-10 NOTE — Transfer of Care (Signed)
Immediate Anesthesia Transfer of Care Note  Patient: Shirley Barton  Procedure(s) Performed: ENDOSCOPIC RETROGRADE CHOLANGIOPANCREATOGRAPHY (ERCP) WITH PROPOFOL  Patient Location: PACU  Anesthesia Type:General  Level of Consciousness: awake, alert  and oriented  Airway & Oxygen Therapy: Patient Spontanous Breathing  Post-op Assessment: Report given to RN and Post -op Vital signs reviewed and stable  Post vital signs: Reviewed and stable  Last Vitals:  Vitals Value Taken Time  BP 107/65 11/10/20 1114  Temp    Pulse 84 11/10/20 1113  Resp 0 11/10/20 1113  SpO2 99 % 11/10/20 1113  Vitals shown include unvalidated device data.  Last Pain:  Vitals:   11/10/20 1109  TempSrc: Temporal  PainSc: Asleep         Complications: No notable events documented.

## 2020-11-10 NOTE — H&P (Signed)
Lucilla Lame, MD [address] [place], [place]  [address] [phone] Fax : [phone]  Primary Care Physician:  Erma Heritage, MD Primary Gastroenterologist:  Dr. Allen Norris  Pre-Procedure History & Physical: HPI:  Shirley Barton is a 70 y.o. female is here for an ERCP.   Past Medical History:  Diagnosis Date   Abdominal pain, generalized    Anxiety    Atypical chest pain    Back pain    Basal cell carcinoma of skin 2013   resected from Left scalp area.    COPD (chronic obstructive pulmonary disease) (HCC)    Depression    DOE (dyspnea on exertion)    GERD (gastroesophageal reflux disease)    Hypertension    Pancreatic cancer ([place])    Shortness of breath dyspnea     Past Surgical History:  Procedure Laterality Date   BASAL CELL CARCINOMA EXCISION  2013   CARPAL TUNNEL RELEASE Left 90s   CHOLECYSTECTOMY N/A 09/16/2015   Procedure: LAPAROSCOPIC CHOLECYSTECTOMY WITH INTRAOPERATIVE CHOLANGIOGRAM;  Surgeon: Robert Bellow, MD;  Location: [facility] ORS;  Service: General;  Laterality: N/A;   COLONOSCOPY  [id number]   [place]   ERCP N/A 08/05/2020   Procedure: ENDOSCOPIC RETROGRADE CHOLANGIOPANCREATOGRAPHY (ERCP);  Surgeon: Lucilla Lame, MD;  Location: [facility] ENDOSCOPY;  Service: Endoscopy;  Laterality: N/A;   ESOPHAGOGASTRODUODENOSCOPY (EGD) WITH PROPOFOL N/A 02/05/2017   Procedure: ESOPHAGOGASTRODUODENOSCOPY (EGD) WITH PROPOFOL;  Surgeon: Lollie Sails, MD;  Location: [facility] ENDOSCOPY;  Service: Endoscopy;  Laterality: N/A;   PERIPHERAL VASCULAR CATHETERIZATION Bilateral 12/27/2015   Procedure: Lower Extremity Angiography;  Surgeon: Katha Cabal, MD;  Location: [facility] CV LAB;  Service: Cardiovascular;  Laterality: Bilateral;   PORTA CATH INSERTION N/A 09/08/2020   Procedure: PORTA CATH INSERTION;  Surgeon: Algernon Huxley, MD;  Location: [facility] LAB;  Service: Cardiovascular;  Laterality: N/A;   TONSILLECTOMY      Prior to Admission  medications   Medication Sig Start Date End Date Taking? Authorizing Provider  acetaminophen (TYLENOL) 325 MG tablet Take 650 mg by mouth daily.   Yes [provider]  b complex vitamins capsule Take 1 capsule by mouth daily.   Yes [provider]  B Complex-C-Folic Acid (B COMPLEX-VITAMIN C-FOLIC ACID) 1 MG tablet Take 1 tablet by mouth daily with breakfast. 08/07/20  Yes Fritzi Mandes, MD  cetirizine (ZYRTEC) 10 MG tablet Take 10 mg by mouth daily. 03/18/18  Yes [provider]  diphenoxylate-atropine (LOMOTIL) 2.5-0.025 MG tablet Take 1 tablet by mouth 4 (four) times daily as needed for diarrhea or loose stools. 10/10/20  Yes Cammie Sickle, MD  Fluticasone-Salmeterol (ADVAIR) 100-50 MCG/DOSE AEPB Inhale 1 puff into the lungs 2 (two) times daily.   Yes [provider]  HYDROcodone-acetaminophen (NORCO) 5-325 MG tablet Take 1 tablet by mouth every 6 (six) hours as needed for moderate pain. 10/17/20  Yes Cammie Sickle, MD  KLOR-CON M20 20 MEQ tablet TAKE 1 TABLET BY MOUTH TWICE A DAY 11/09/20  Yes Cammie Sickle, MD  lidocaine-prilocaine (EMLA) cream Apply 30 -45 mins prior to port access. 09/08/20  Yes Cammie Sickle, MD  magic mouthwash w/lidocaine SOLN Take 10 mLs by mouth every 2 (two) hours as needed for mouth pain. 09/16/20  Yes Borders, Kirt Boys, NP  ondansetron (ZOFRAN ODT) 4 MG disintegrating tablet Take 1 tablet (4 mg total) by mouth every 6 (six) hours as needed for nausea or vomiting. 08/28/15  Yes Delman Kitten, MD  ondansetron (ZOFRAN) 8 MG tablet One pill every 8 hours as needed for nausea/vomitting. 10/17/20  Yes Cammie Sickle, MD  prochlorperazine (COMPAZINE) 10 MG tablet Take 1 tablet (10 mg total) by mouth every 6 (six) hours as needed for nausea or vomiting. 10/17/20  Yes Cammie Sickle, MD  pantoprazole (PROTONIX) 40 MG tablet Take by mouth. Patient not taking: Reported on 11/10/2020 09/07/15   [provider]  predniSONE (DELTASONE) 20 MG tablet Take 1 tablet (20 mg total) by mouth daily with breakfast. Once a day with food. Patient not taking: Reported on 11/10/2020 10/24/20   Cammie Sickle, MD    Allergies as of 10/20/2020 - Review Complete 10/10/2020  Allergen Reaction Noted   Tramadol Other (See Comments) 04/10/2012   Cortizone-10 [hydrocortisone] Rash 01/03/2015   Penicillins Rash 04/10/2012    Family History  Problem Relation Age of Onset   Cerebral palsy Daughter    Colon polyps Sister    Leukemia Daughter    Depression Son    Breast cancer Neg Hx     Social History   Socioeconomic History   Marital status: Divorced    Spouse name: Not on file   Number of children: Not on file   Years of education: Not on file   Highest education level: Not on file  Occupational History   Occupation: retired  Tobacco Use   Smoking status: Former    Types: Cigarettes    Quit date: 03/02/2012    Years since quitting: 8.6   Smokeless tobacco: Never  Vaping Use   Vaping Use: Never used  Substance and Sexual Activity   Alcohol use: No    Alcohol/week: 0.0 standard drinks   Drug use: No   Sexual activity: Not on file  Other Topics Concern   Not on file  Social History Narrative   [place]; self; smoker- quit 07/12/11; daughter died of leukemia-2020. 2 sons [[place]; [place]]; used to be caregiver/ retd.    Social Determinants of Health   Financial Resource Strain: Not on file  Food Insecurity: Not on file  Transportation Needs: Not on file  Physical Activity: Not on file  Stress: Not on file  Social Connections: Not on file  Intimate Partner Violence: Not on file    Review of Systems: See HPI, otherwise negative ROS  Physical Exam: BP 111/86   Pulse (!) 118   Temp (!) 97.3 F (36.3 C) (Temporal)   Resp 16   Ht '5\' 4"'$  (1.626 m)   Wt 44.5 kg   LMP 01/03/1995 (Approximate)   SpO2 98%   BMI 16.82 kg/m  General:   Alert,  pleasant and cooperative in NAD Head:   Normocephalic and atraumatic. Neck:  Supple; no masses or thyromegaly. Lungs:  Clear throughout to auscultation.    Heart:  Regular rate and rhythm. Abdomen:  Soft, nontender and nondistended. Normal bowel sounds, without guarding, and without rebound.   Neurologic:  Alert and  oriented x4;  grossly normal neurologically.  Impression/Plan: [name] is here for an ERCP to be performed for pancreatic mass  Risks, benefits, limitations, and alternatives regarding  ERCP have been reviewed with the patient.  Questions have been answered.  All parties agreeable.   Lucilla Lame, MD  11/10/2020, 10:39 AM

## 2020-11-10 NOTE — Anesthesia Postprocedure Evaluation (Signed)
Anesthesia Post Note  Patient: Shirley Barton  Procedure(s) Performed: ENDOSCOPIC RETROGRADE CHOLANGIOPANCREATOGRAPHY (ERCP) WITH PROPOFOL  Patient location during evaluation: Endoscopy Anesthesia Type: General Level of consciousness: awake and alert Pain management: pain level controlled Vital Signs Assessment: post-procedure vital signs reviewed and stable Respiratory status: spontaneous breathing, nonlabored ventilation, respiratory function stable and patient connected to nasal cannula oxygen Cardiovascular status: blood pressure returned to baseline and stable Postop Assessment: no apparent nausea or vomiting Anesthetic complications: no   No notable events documented.   Last Vitals:  Vitals:   11/10/20 1139 11/10/20 1149  BP: 103/65 109/70  Pulse: 87   Resp: 15 10  Temp:    SpO2: 99%     Last Pain:  Vitals:   11/10/20 1149  TempSrc:   PainSc: 6                  Precious Haws Trevino Wyatt

## 2020-11-10 NOTE — Op Note (Signed)
[facility] Patient Name: Jaquira Emch Procedure Date: 11/10/2020 10:17 AM MRN: [id number] Account #: [id number] Date of Birth: 1950/09/23 Admit Type: Outpatient Age: 70 Room: [facility] ENDO ROOM 4 Gender: Female Note Status: Finalized Procedure:             ERCP Indications:           Malignant tumor of the head of pancreas, Stent change Providers:             Lucilla Lame MD, MD Referring MD:          Pola Corn MD Medicines:             Propofol per Anesthesia Complications:         No immediate complications. Procedure:             Pre-Anesthesia Assessment:                        - Prior to the procedure, a History and Physical was                         performed, and patient medications and allergies were                         reviewed. The patient's tolerance of previous                         anesthesia was also reviewed. The risks and benefits                         of the procedure and the sedation options and risks                         were discussed with the patient. All questions were                         answered, and informed consent was obtained. Prior                         Anticoagulants: The patient has taken no previous                         anticoagulant or antiplatelet agents. ASA Grade                         Assessment: III - A patient with severe systemic                         disease. After reviewing the risks and benefits, the                         patient was deemed in satisfactory condition to                         undergo the procedure.                        After obtaining informed consent, the scope was passed  under direct vision. Throughout the procedure, the                         patient's blood pressure, pulse, and oxygen                         saturations were monitored continuously. The [employer] D single use duodenoscope  was                         introduced through the mouth, and used to inject                         contrast into and used to inject contrast into the                         bile duct. The ERCP was accomplished without                         difficulty. The patient tolerated the procedure well. Findings:      A scout film of the abdomen was obtained. One stent ending in the main       bile duct was seen. One stent was removed from the biliary tree using a       snare. The bile duct was deeply cannulated with the short-nosed traction       sphincterotome. Contrast was injected. I personally interpreted the bile       duct images. There was brisk flow of contrast through the ducts. Image       quality was excellent. Contrast extended to the entire biliary tree. The       common bile duct contained a single segmental stenosis 10 mm in length.       A wire was passed into the biliary tree. One 10 Fr by 6 cm covered metal       stent was placed into the common bile duct. Bile flowed through the       stent. The stent was in good position. 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 The stricture was malignant                         appearing.                        - One stent was removed from the biliary tree.                        - One covered metal stent was placed into the common                         bile duct. Recommendation:        - Discharge patient to home.                        -  Resume previous diet.                        - Watch for pancreatitis, bleeding, perforation, and                         cholangitis. Procedure Code(s):     --- Professional ---                        [phone], Endoscopic retrograde cholangiopancreatography                         (ERCP); with removal and exchange of stent(s), biliary                         or pancreatic duct, including pre- and post-dilation                          and guide wire passage, when performed, including                         sphincterotomy, when performed, each stent exchanged                        [phone], Endoscopic catheterization of the biliary                         ductal system, radiological supervision and                         interpretation Diagnosis Code(s):     --- Professional ---                        C25.0, Malignant neoplasm of head of pancreas                        Z46.59, Encounter for fitting and adjustment of other                         gastrointestinal appliance and device                        K83.1, Obstruction of bile duct CPT copyright 2019 American Medical Association. All rights reserved. The codes documented in this report are preliminary and upon coder review may  be revised to meet current compliance requirements. Lucilla Lame MD, MD 11/10/2020 11:09:17 AM This report has been signed electronically. Number of Addenda: 0 Note Initiated On: 11/10/2020 10:17 AM Estimated Blood Loss:  Estimated blood loss: none.      [facility]

## 2020-11-11 ENCOUNTER — Encounter: Payer: Self-pay | Admitting: Gastroenterology

## 2020-11-11 ENCOUNTER — Other Ambulatory Visit: Payer: Self-pay | Admitting: *Deleted

## 2020-11-11 DIAGNOSIS — C250 Malignant neoplasm of head of pancreas: Secondary | ICD-10-CM

## 2020-11-14 ENCOUNTER — Inpatient Hospital Stay (HOSPITAL_BASED_OUTPATIENT_CLINIC_OR_DEPARTMENT_OTHER): Payer: Medicare HMO | Admitting: Internal Medicine

## 2020-11-14 ENCOUNTER — Inpatient Hospital Stay: Payer: Medicare HMO | Attending: Internal Medicine

## 2020-11-14 ENCOUNTER — Encounter: Payer: Self-pay | Admitting: Internal Medicine

## 2020-11-14 VITALS — BP 94/79 | HR 114 | Temp 98.0°F | Resp 16 | Ht 64.0 in | Wt 92.0 lb

## 2020-11-14 DIAGNOSIS — Z87891 Personal history of nicotine dependence: Secondary | ICD-10-CM | POA: Insufficient documentation

## 2020-11-14 DIAGNOSIS — K831 Obstruction of bile duct: Secondary | ICD-10-CM | POA: Insufficient documentation

## 2020-11-14 DIAGNOSIS — Z5111 Encounter for antineoplastic chemotherapy: Secondary | ICD-10-CM | POA: Diagnosis not present

## 2020-11-14 DIAGNOSIS — C250 Malignant neoplasm of head of pancreas: Secondary | ICD-10-CM | POA: Insufficient documentation

## 2020-11-14 DIAGNOSIS — R64 Cachexia: Secondary | ICD-10-CM | POA: Diagnosis not present

## 2020-11-14 DIAGNOSIS — K529 Noninfective gastroenteritis and colitis, unspecified: Secondary | ICD-10-CM | POA: Diagnosis not present

## 2020-11-14 DIAGNOSIS — Z818 Family history of other mental and behavioral disorders: Secondary | ICD-10-CM | POA: Insufficient documentation

## 2020-11-14 DIAGNOSIS — Z8371 Family history of colonic polyps: Secondary | ICD-10-CM | POA: Insufficient documentation

## 2020-11-14 DIAGNOSIS — Z79899 Other long term (current) drug therapy: Secondary | ICD-10-CM | POA: Diagnosis not present

## 2020-11-14 DIAGNOSIS — J449 Chronic obstructive pulmonary disease, unspecified: Secondary | ICD-10-CM | POA: Insufficient documentation

## 2020-11-14 DIAGNOSIS — Z820 Family history of epilepsy and other diseases of the nervous system: Secondary | ICD-10-CM | POA: Insufficient documentation

## 2020-11-14 DIAGNOSIS — R7989 Other specified abnormal findings of blood chemistry: Secondary | ICD-10-CM | POA: Diagnosis not present

## 2020-11-14 DIAGNOSIS — Z806 Family history of leukemia: Secondary | ICD-10-CM | POA: Diagnosis not present

## 2020-11-14 DIAGNOSIS — G8929 Other chronic pain: Secondary | ICD-10-CM | POA: Diagnosis not present

## 2020-11-14 DIAGNOSIS — R5383 Other fatigue: Secondary | ICD-10-CM | POA: Insufficient documentation

## 2020-11-14 DIAGNOSIS — R0602 Shortness of breath: Secondary | ICD-10-CM | POA: Diagnosis not present

## 2020-11-14 DIAGNOSIS — K219 Gastro-esophageal reflux disease without esophagitis: Secondary | ICD-10-CM | POA: Insufficient documentation

## 2020-11-14 DIAGNOSIS — R109 Unspecified abdominal pain: Secondary | ICD-10-CM | POA: Insufficient documentation

## 2020-11-14 LAB — CBC WITH DIFFERENTIAL/PLATELET
Abs Immature Granulocytes: 0.17 10*3/uL — ABNORMAL HIGH (ref 0.00–0.07)
Basophils Absolute: 0.1 10*3/uL (ref 0.0–0.1)
Basophils Relative: 0 %
Eosinophils Absolute: 0.1 10*3/uL (ref 0.0–0.5)
Eosinophils Relative: 1 %
HCT: 32.3 % — ABNORMAL LOW (ref 36.0–46.0)
Hemoglobin: 10.2 g/dL — ABNORMAL LOW (ref 12.0–15.0)
Immature Granulocytes: 1 %
Lymphocytes Relative: 19 %
Lymphs Abs: 3 10*3/uL (ref 0.7–4.0)
MCH: 28.4 pg (ref 26.0–34.0)
MCHC: 31.6 g/dL (ref 30.0–36.0)
MCV: 90 fL (ref 80.0–100.0)
Monocytes Absolute: 1.1 10*3/uL — ABNORMAL HIGH (ref 0.1–1.0)
Monocytes Relative: 7 %
Neutro Abs: 11.5 10*3/uL — ABNORMAL HIGH (ref 1.7–7.7)
Neutrophils Relative %: 72 %
Platelets: 401 10*3/uL — ABNORMAL HIGH (ref 150–400)
RBC: 3.59 MIL/uL — ABNORMAL LOW (ref 3.87–5.11)
RDW: 16.5 % — ABNORMAL HIGH (ref 11.5–15.5)
WBC: 15.9 10*3/uL — ABNORMAL HIGH (ref 4.0–10.5)
nRBC: 0 % (ref 0.0–0.2)

## 2020-11-14 LAB — COMPREHENSIVE METABOLIC PANEL
ALT: 16 U/L (ref 0–44)
AST: 23 U/L (ref 15–41)
Albumin: 2.6 g/dL — ABNORMAL LOW (ref 3.5–5.0)
Alkaline Phosphatase: 218 U/L — ABNORMAL HIGH (ref 38–126)
Anion gap: 10 (ref 5–15)
BUN: 6 mg/dL — ABNORMAL LOW (ref 8–23)
CO2: 29 mmol/L (ref 22–32)
Calcium: 8.6 mg/dL — ABNORMAL LOW (ref 8.9–10.3)
Chloride: 95 mmol/L — ABNORMAL LOW (ref 98–111)
Creatinine, Ser: 0.59 mg/dL (ref 0.44–1.00)
GFR, Estimated: >60 mL/min (ref >60)
Glucose, Bld: 105 mg/dL — ABNORMAL HIGH (ref 70–99)
Potassium: 3.3 mmol/L — ABNORMAL LOW (ref 3.5–5.1)
Sodium: 134 mmol/L — ABNORMAL LOW (ref 135–145)
Total Bilirubin: 1.4 mg/dL — ABNORMAL HIGH (ref 0.3–1.2)
Total Protein: 7.5 g/dL (ref 6.5–8.1)

## 2020-11-14 MED ORDER — SODIUM CHLORIDE 0.9% FLUSH
10.0000 mL | INTRAVENOUS | Status: DC | PRN
  Administered 2020-11-14: 10 mL
  Filled 2020-11-14: qty 10

## 2020-11-14 MED ORDER — MEGESTROL ACETATE 625 MG/5ML PO SUSP: 625.0000 mg | Freq: Every day | ORAL | 0 refills | Status: DC

## 2020-11-14 MED ORDER — HEPARIN SOD (PORK) LOCK FLUSH 100 UNIT/ML IV SOLN
500.0000 [IU] | Freq: Once | INTRAVENOUS | Status: AC
  Administered 2020-11-14: 500 [IU]
  Filled 2020-11-14: qty 5

## 2020-11-14 NOTE — Progress Notes (Signed)
Nutrition Follow-up:   Patient with pancreatic cancer.  Treatment on hold for approximately 1 month.    Met with patient and son.  Patient reports foods tasting really salty or enhanced sweetness.  Overall appetite poor.  Has been drinking equate chocolate 350 calorie shakes as tolerated.  "I know I have to eat."    Medications: starting megace  Labs: reviewed  Anthropometrics:   Weight 92 lb today  96 lb on 7/25 97 lb 8 oz on 7/18 98 lb on 7/11 100 lb on 6/20 103 lb on 6/9   NUTRITION DIAGNOSIS: Inadequate oral intake continues   INTERVENTION:  Discussed strategies to help with taste change.  Handout provided Continue oral nutrition supplements as able Son encouraging patient to set an alarm q 2 hours to eat.  Encouraged trial of appetite stimulant. MD to start    MONITORING, EVALUATION, GOAL: weight trends, intake   NEXT VISIT: Monday, August 29 during infusion  Mitzi Lilja B. Zenia Resides, [place], [place] Registered Dietitian [phone] (mobile)

## 2020-11-14 NOTE — Assessment & Plan Note (Signed)
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 Dr.Shah]-concerning for tumor abutting the celiac axis /common hepatic artery /portal vein/superior mesenteric vein; without any metastatic disease.  Borderline resectable Elevated CA 19-9. Currently gemcitabine Abraxane [day 1 day 8-day 15; q.  28 days]   #Continue to HOLD  with cycle #1 gemcitabine Abraxane day 15 today; Labs today reviewed; sec to weight loss/poor taste.  Last chemotherapy approximately 1 month ago.  #Had a long discussion with the patient and son regarding unfortunately patient seems to be experiencing some of the paraneoplastic signs and symptoms of pancreatic cancer-which is making her tolerate chemotherapy very poorly.  Discussed that we will unlikely be able to cure her of the disease-and surgery might not be an option upfront or even down the line.  We will discuss with Dr. Manuella Ghazi.  However I would recommend evaluation with radiation oncology for chemoradiation-for local control.  I would also check a CT pancreas protocol to assess current status.  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  Start patient on Megace.  Discussed the potential concern for thromboembolic events.  # Chronic Diarrhea/adominal pain: Stable.  #Recurrent biliary obstruction/elevated LFTs-s/p repeat ERCP [AUG 11th, Dr.Wohl]- Improving.   #COPD/currently quit smoking not on home O2; on inhalers-STABLE.   # DISPOSITION: will call # HOLD chemo; deaccess # CT ab- ASAP # Referal to Dr.Chrystal re: apncreatic cancer- ASAP # Follow up in 2 weeks- MD; labs- cbc/cmp;ca-19-9- gemcitabine chemo- Dr.B  # 40 minutes face-to-face with the patient discussing the above plan of care; more than 50% of time spent on prognosis/ natural history; counseling and coordination.

## 2020-11-14 NOTE — Progress Notes (Signed)
[facility] NOTE  Patient Care Team: Erma Heritage, MD as PCP - General (Family Medicine) Rico Junker, RN as Registered Nurse Theodore Demark, RN as Registered Nurse Ezzard Standing, PA-C as Physician Assistant (Gastroenterology) Bary Castilla, Forest Gleason, MD (General Surgery) Clent Jacks, RN as Oncology Nurse Navigator Cammie Sickle, MD as Consulting Physician (Hematology and Oncology)  CHIEF COMPLAINTS/PURPOSE OF CONSULTATION: Pancreatic cancer  #  Oncology History Overview Note  # MAY 2022-pancreatic adenocarcinoma [obstructive jaundice-]MRI-18 mm pancreatic head mass; EUS [[facility]; Dr.Spaete]-abutment of portal vein/SMV; no lymphadenopathy noted; May 5 CA 19-9-163; s/p stenting -May 25th-47.   #Obstructive jaundice-s/p ERCP stenting [Dr.Wohl]  MAY 2022- ENDOSONOGRAPHIC FINDING: : A round mass was identified in the pancreatic head.  The mass was hypoechoic. The mass measured 16 mm by 17  mm in maximal cross-sectional diameter. The outer  margins were irregular. There was sonographic evidence  suggesting invasion into the portal vein (manifested  by abutment) and the superior mesenteric vein  (manifested by abutment).   #Pancreatic adenocarcinoma pT1 however on EUS/CT [[facility]. Dr.Shah]-concerning for tumor abutting the celiac axis /common hepatic artery /portal vein/superior mesenteric vein; without any metastatic disease.  Borderline resectable Elevated CA 19-9.  [S/p evaluation at [facility]  # June 9th- FOLFIRINOX cycle #1; discontinued because of poor GI tolerance  # &/01-2021-gemcitabine Abraxane ['100mg'$ /2] day 1 day 815  # July 11th-gemcitabine and Abraxane day.  ---------------------------------------------------------      Malignant neoplasm of head of pancreas ([place])  08/24/2020 Initial Diagnosis   Malignant neoplasm of head of pancreas ([place])   09/09/2020 - 09/12/2020 Chemotherapy          10/10/2020 -  Chemotherapy     Patient is on Treatment Plan: PANCREATIC ABRAXANE / GEMCITABINE D1,8,15 Q28D          HISTORY OF PRESENTING ILLNESS:  Shirley Barton 70 y.o.  female with a history of COPD; chronic abdominal pain-locally advanced pancreatic cancer with involvement of the vessels currently on neoadjuvant chemotherapy.  In the interim patient was evaluated by GI given elevated LFTs-underwent ERCP had old stent exchanged; noted to have a new stricture-new stent placed.  Patient continues to have poor appetite.  Possible weight loss.  Positive for poor taste.  She has lost about 4 more pounds.  She currently weighs 94 pounds.  Chronic mild abdominal pain.  Chronic nausea.  No fevers or chills.   Review of Systems  Constitutional:  Positive for malaise/fatigue and weight loss. Negative for chills, diaphoresis and fever.  HENT:  Negative for nosebleeds and sore throat.   Eyes:  Negative for double vision.  Respiratory:  Positive for shortness of breath. Negative for cough, hemoptysis, sputum production and wheezing.   Cardiovascular:  Negative for chest pain, palpitations, orthopnea and leg swelling.  Gastrointestinal:  Positive for abdominal pain and diarrhea. Negative for blood in stool, constipation, heartburn, melena and vomiting.  Genitourinary:  Negative for dysuria, frequency and urgency.  Musculoskeletal:  Negative for back pain and joint pain.  Skin: Negative.  Negative for itching and rash.  Neurological:  Negative for dizziness, tingling, focal weakness, weakness and headaches.  Endo/Heme/Allergies:  Does not bruise/bleed easily.  Psychiatric/Behavioral:  Negative for depression. The patient is not nervous/anxious and does not have insomnia.     MEDICAL HISTORY:  Past Medical History:  Diagnosis Date   Abdominal pain, generalized    Anxiety    Atypical chest pain    Back pain  Basal cell carcinoma of skin 2011/07/06   resected from Left scalp area.    COPD (chronic obstructive pulmonary  disease) (HCC)    Depression    DOE (dyspnea on exertion)    GERD (gastroesophageal reflux disease)    Hypertension    Pancreatic cancer ([place])    Shortness of breath dyspnea     SURGICAL HISTORY: Past Surgical History:  Procedure Laterality Date   BASAL CELL CARCINOMA EXCISION  2011-07-06   CARPAL TUNNEL RELEASE Left 90s   CHOLECYSTECTOMY N/A 09/16/2015   Procedure: LAPAROSCOPIC CHOLECYSTECTOMY WITH INTRAOPERATIVE CHOLANGIOGRAM;  Surgeon: Robert Bellow, MD;  Location: [facility] ORS;  Service: General;  Laterality: N/A;   COLONOSCOPY  1990"s   [place]   ENDOSCOPIC RETROGRADE CHOLANGIOPANCREATOGRAPHY (ERCP) WITH PROPOFOL N/A 11/10/2020   Procedure: ENDOSCOPIC RETROGRADE CHOLANGIOPANCREATOGRAPHY (ERCP) WITH PROPOFOL;  Surgeon: Lucilla Lame, MD;  Location: [facility] ENDOSCOPY;  Service: Endoscopy;  Laterality: N/A;   ERCP N/A 08/05/2020   Procedure: ENDOSCOPIC RETROGRADE CHOLANGIOPANCREATOGRAPHY (ERCP);  Surgeon: Lucilla Lame, MD;  Location: [facility] ENDOSCOPY;  Service: Endoscopy;  Laterality: N/A;   ESOPHAGOGASTRODUODENOSCOPY (EGD) WITH PROPOFOL N/A 02/05/2017   Procedure: ESOPHAGOGASTRODUODENOSCOPY (EGD) WITH PROPOFOL;  Surgeon: Lollie Sails, MD;  Location: [facility] ENDOSCOPY;  Service: Endoscopy;  Laterality: N/A;   PERIPHERAL VASCULAR CATHETERIZATION Bilateral 12/27/2015   Procedure: Lower Extremity Angiography;  Surgeon: Katha Cabal, MD;  Location: [facility];  Service: Cardiovascular;  Laterality: Bilateral;   PORTA CATH INSERTION N/A 09/08/2020   Procedure: PORTA CATH INSERTION;  Surgeon: Algernon Huxley, MD;  Location: [facility];  Service: Cardiovascular;  Laterality: N/A;   TONSILLECTOMY      SOCIAL HISTORY: Social History   Socioeconomic History   Marital status: Divorced    Spouse name: Not on file   Number of children: Not on file   Years of education: Not on file   Highest education level: Not on file  Occupational History   Occupation: retired  Tobacco  Use   Smoking status: Former    Types: Cigarettes    Quit date: 03/02/2012    Years since quitting: 8.7   Smokeless tobacco: Never  Vaping Use   Vaping Use: Never used  Substance and Sexual Activity   Alcohol use: No    Alcohol/week: 0.0 standard drinks   Drug use: No   Sexual activity: Not on file  Other Topics Concern   Not on file  Social History Narrative   [place]; self; smoker- quit 2011-07-06; daughter died of leukemia-2020. 2 sons [[place]; [place]]; used to be caregiver/ retd.    Social Determinants of Health   Financial Resource Strain: Not on file  Food Insecurity: Not on file  Transportation Needs: Not on file  Physical Activity: Not on file  Stress: Not on file  Social Connections: Not on file  Intimate Partner Violence: Not on file    FAMILY HISTORY: Family History  Problem Relation Age of Onset   Cerebral palsy Daughter    Colon polyps Sister    Leukemia Daughter    Depression Son    Breast cancer Neg Hx     ALLERGIES:  is allergic to tramadol and penicillins.  MEDICATIONS:  Current Outpatient Medications  Medication Sig Dispense Refill   acetaminophen (TYLENOL) 325 MG tablet Take 650 mg by mouth daily.     b complex vitamins capsule Take 1 capsule by mouth daily.     B Complex-C-Folic Acid (B COMPLEX-VITAMIN C-FOLIC ACID) 1 MG tablet Take 1 tablet by  mouth daily with breakfast. 30 tablet 0   cetirizine (ZYRTEC) 10 MG tablet Take 10 mg by mouth daily.     Fluticasone-Salmeterol (ADVAIR) 100-50 MCG/DOSE AEPB Inhale 1 puff into the lungs 2 (two) times daily.     HYDROcodone-acetaminophen (NORCO) 5-325 MG tablet Take 1 tablet by mouth every 6 (six) hours as needed for moderate pain. 45 tablet 0   KLOR-CON M20 20 MEQ tablet TAKE 1 TABLET BY MOUTH TWICE A DAY 60 tablet 0   lidocaine-prilocaine (EMLA) cream Apply 30 -45 mins prior to port access. 30 g 0   magic mouthwash w/lidocaine SOLN Take 10 mLs by mouth every 2 (two) hours as needed for mouth pain. 360 mL  0   megestrol (MEGACE ES) 625 MG/5ML suspension Take 5 mLs (625 mg total) by mouth daily. 150 mL 0   ondansetron (ZOFRAN ODT) 4 MG disintegrating tablet Take 1 tablet (4 mg total) by mouth every 6 (six) hours as needed for nausea or vomiting. 20 tablet 0   ondansetron (ZOFRAN) 8 MG tablet One pill every 8 hours as needed for nausea/vomitting. 40 tablet 3   pantoprazole (PROTONIX) 40 MG tablet Take by mouth.     predniSONE (DELTASONE) 20 MG tablet Take 1 tablet (20 mg total) by mouth daily with breakfast. Once a day with food. 30 tablet 0   prochlorperazine (COMPAZINE) 10 MG tablet Take 1 tablet (10 mg total) by mouth every 6 (six) hours as needed for nausea or vomiting. 40 tablet 3   diphenoxylate-atropine (LOMOTIL) 2.5-0.025 MG tablet Take 1 tablet by mouth 4 (four) times daily as needed for diarrhea or loose stools. (Patient not taking: Reported on 11/14/2020) 60 tablet 1   No current facility-administered medications for this visit.   Facility-Administered Medications Ordered in Other Visits  Medication Dose Route Frequency Provider Last Rate Last Admin   heparin lock flush 100 unit/mL  500 Units Intracatheter Once Charlaine Dalton R, MD       sodium chloride flush (NS) 0.9 % injection 10 mL  10 mL Intracatheter PRN Cammie Sickle, MD   10 mL at 11/14/20 0844      .  PHYSICAL EXAMINATION: ECOG PERFORMANCE STATUS: 0 - Asymptomatic  Vitals:   11/14/20 0852  BP: 94/79  Pulse: (!) 114  Resp: 16  Temp: 98 F (36.7 C)  SpO2: 99%   Filed Weights   11/14/20 0852  Weight: 92 lb (41.7 kg)    Physical Exam Constitutional:      Comments: Caucasian female patient ambulating independently.  Accompanied by her son.  HENT:     Head: Normocephalic and atraumatic.     Mouth/Throat:     Pharynx: No oropharyngeal exudate.  Eyes:     Pupils: Pupils are equal, round, and reactive to light.  Cardiovascular:     Rate and Rhythm: Normal rate and regular rhythm.  Pulmonary:      Effort: No respiratory distress.     Breath sounds: No wheezing.     Comments: Decreased air entry bilaterally.  No wheeze or crackles Abdominal:     General: Bowel sounds are normal. There is no distension.     Palpations: Abdomen is soft. There is no mass.     Tenderness: There is no abdominal tenderness. There is no guarding or rebound.  Musculoskeletal:        General: No tenderness. Normal range of motion.     Cervical back: Normal range of motion and neck supple.  Skin:  General: Skin is warm.  Neurological:     Mental Status: She is alert and oriented to person, place, and time.  Psychiatric:        Mood and Affect: Affect normal.     LABORATORY DATA:  I have reviewed the data as listed Lab Results  Component Value Date   WBC 15.9 (H) 11/14/2020   HGB 10.2 (L) 11/14/2020   HCT 32.3 (L) 11/14/2020   MCV 90.0 11/14/2020   PLT 401 (H) 11/14/2020   Recent Labs    08/04/20 1138 08/06/20 0457 10/20/20 1302 10/24/20 0856 11/14/20 0842  NA  --    < > 135 135 134*  K  --    < > 3.4* 3.3* 3.3*  CL  --    < > 100 100 95*  CO2  --    < > '26 27 29  '$ GLUCOSE  --    < > 170* 97 105*  BUN  --    < > 26* 5* 6*  CREATININE  --    < > 0.58 0.55 0.59  CALCIUM  --    < > 8.6* 8.6* 8.6*  GFRNONAA  --    < > >60 >60 >60  PROT  --    < > 6.7 6.5 7.5  ALBUMIN  --    < > 3.0* 3.0* 2.6*  AST  --    < > 110* 53* 23  ALT  --    < > 135* 64* 16  ALKPHOS  --    < > 303* 324* 218*  BILITOT  --    < > 2.7* 2.3* 1.4*  BILIDIR 3.7*  --   --   --   --    < > = values in this interval not displayed.    RADIOGRAPHIC STUDIES: I have personally reviewed the radiological images as listed and agreed with the findings in the report. DG C-Arm 1-60 Min-No Report  Result Date: 11/10/2020 Fluoroscopy was utilized by the requesting physician.  No radiographic interpretation.   US ABDOMEN LIMITED RUQ (LIVER/GB)  Result Date: 10/25/2020 CLINICAL DATA:  Malignant neoplasm of head of pancreas. LFT  elevation. EXAM: ULTRASOUND ABDOMEN LIMITED RIGHT UPPER QUADRANT COMPARISON:  CT 08/04/2020 FINDINGS: Gallbladder: Prior cholecystectomy Common bile duct: Diameter: Dilated common bile duct measuring 9 mm. Common bile duct stent visualized. Liver: Intrahepatic biliary ductal dilatation. No focal hepatic abnormality. Normal echotexture. Portal vein is patent on color Doppler imaging with normal direction of blood flow towards the liver. Other: None. IMPRESSION: Intrahepatic and extrahepatic biliary ductal dilatation, similar to prior CT. Biliary stent partially visualized. Electronically Signed   By: Rolm Baptise M.D.   On: 10/25/2020 09:27     ASSESSMENT & PLAN:   Malignant neoplasm of head of pancreas [facility]) #Pancreatic adenocarcinoma pT1 however on EUS/CT [[facility]. Dr.Shah]-concerning for tumor abutting the celiac axis /common hepatic artery /portal vein/superior mesenteric vein; without any metastatic disease.  Borderline resectable Elevated CA 19-9. Currently gemcitabine Abraxane [day 1 day 8-day 15; q.  28 days]   #Continue to HOLD  with cycle #1 gemcitabine Abraxane day 15 today; Labs today reviewed; sec to weight loss/poor taste.  Last chemotherapy approximately 1 month ago.  #Had a long discussion with the patient and son regarding unfortunately patient seems to be experiencing some of the paraneoplastic signs and symptoms of pancreatic cancer-which is making her tolerate chemotherapy very poorly.  Discussed that we will unlikely be able to cure her of the disease-and surgery  might not be an option upfront or even down the line.  We will discuss with Dr. Manuella Ghazi.  However I would recommend evaluation with radiation oncology for chemoradiation-for local control.  I would also check a CT pancreas protocol to assess current status.  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  Start patient on Megace.  Discussed the potential  concern for thromboembolic events.  # Chronic Diarrhea/adominal pain: Stable.  #Recurrent biliary obstruction/elevated LFTs-s/p repeat ERCP [AUG 11th, Dr.Wohl]- Improving.   #COPD/currently quit smoking not on home O2; on inhalers-STABLE.   # DISPOSITION: will call # HOLD chemo; deaccess # CT ab- ASAP # Referal to Dr.Chrystal re: apncreatic cancer- ASAP # Follow up in 2 weeks- MD; labs- cbc/cmp;ca-19-9- gemcitabine chemo- Dr.B  # 40 minutes face-to-face with the patient discussing the above plan of care; more than 50% of time spent on prognosis/ natural history; counseling and coordination.   All questions were answered. The patient knows to call the clinic with any problems, questions or concerns.    Cammie Sickle, MD 11/14/2020 9:48 AM

## 2020-11-14 NOTE — Progress Notes (Signed)
Still having loss of appetite. States that food either tastes "too sweet or too salty" to her. She has been drinking protein supplements.

## 2020-11-15 ENCOUNTER — Encounter: Payer: Self-pay | Admitting: Radiation Oncology

## 2020-11-15 ENCOUNTER — Ambulatory Visit
Admission: RE | Admit: 2020-11-15 | Discharge: 2020-11-15 | Disposition: A | Discharge status: Home / Self Care | Payer: Medicare HMO | Source: Ambulatory Visit | Attending: Internal Medicine | Admitting: Internal Medicine

## 2020-11-15 ENCOUNTER — Other Ambulatory Visit: Payer: Self-pay

## 2020-11-15 ENCOUNTER — Ambulatory Visit
Admission: RE | Admit: 2020-11-15 | Discharge: 2020-11-15 | Disposition: A | Discharge status: Home / Self Care | Payer: Medicare HMO | Source: Ambulatory Visit | Attending: Radiation Oncology | Admitting: Radiation Oncology

## 2020-11-15 VITALS — BP 134/81 | HR 111 | Temp 98.7°F | Wt 94.9 lb

## 2020-11-15 DIAGNOSIS — C250 Malignant neoplasm of head of pancreas: Secondary | ICD-10-CM | POA: Diagnosis present

## 2020-11-15 DIAGNOSIS — R64 Cachexia: Secondary | ICD-10-CM

## 2020-11-15 IMAGING — CT CT ABDOMEN WO/W CM
1 series · 1 of 1 positions shown · IV contrast (omnipaque)
Comparison: MRI [DATE].  CT scan [DATE].

CLINICAL DATA: Pancreatic cancer.  Restaging.

EXAM:
CT ABDOMEN WITHOUT AND WITH CONTRAST
TECHNIQUE: Multidetector CT imaging of the abdomen was performed following the
standard protocol before and following the bolus administration of
intravenous contrast.
CONTRAST:  75mL OMNIPAQUE IOHEXOL 350 MG/ML SOLN

[Series 1: topogram 0.60 cor · coronal · 1.00mm/px · 1 of 1 slices shown]
[im 1/1]
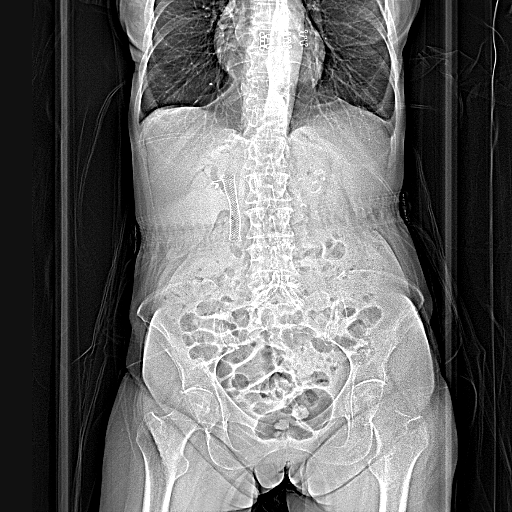

[1 of 1 positions shown; findings below may reference images not displayed]

FINDINGS: Lower chest: Emphysema.

Hepatobiliary: Hypervascular lesion identified in the peripheral
anterior right liver (image 15/series 4) measuring about 8 mm.
Similar hypervascular lesion noted inferior right liver on 51/4
measuring about 7 mm. Neither of these was visualized on the
previous MRI. Hypervascular right hepatic lesion seen on the
previous MRIs not evident by CT today.

Subtle subcapsular area of focal mild arterial phase
hyperenhancement identified inferior right liver on 56/4, not seen
on prior studies.

Finally a tiny 5 mm focus of arterial phase hyperenhancement noted
inferior tip right liver on 71/4. Mild intra hepatic biliary duct
dilatation is associated with pneumobilia. Common bile duct stent
visualized in situ.

Pancreas: Pancreatic head lesion measures 1.6 x 1.1 cm today which
compares to 1.8 x 1.1 cm on previous CT when remeasured in a similar
fashion. Diffuse pancreatic parenchymal atrophy noted with mild
diffuse distention of the main pancreatic duct.

Spleen: Spleen measures 10.7 x 10.6 x 7.8 cm (442 cc).

Adrenals/Urinary Tract: No adrenal nodule or mass. Tiny hypodensity
lower pole right kidney is too small to characterize but likely
benign. Left kidney unremarkable.

Stomach/Bowel: Stomach is nondistended Duodenum is normally
positioned as is the ligament of Treitz. No small bowel or colonic
dilatation within the visualized abdomen.

Vascular/Lymphatic: Abdominal aortic aneurysm again noted measuring
3.5 x 3.4 cm. A collar of abnormal soft tissue abuts the celiac axis
over a circumference of 180 degrees from [DATE] to [DATE] positions
(see axial 43/series 18). Fat planes around the SMA appear
preserved. Portal vein is markedly attenuated with only a string of
patent lumen visible on coronal image 34 of series 14. The

11 mm portal caval node on 67/18 has increased from 5 mm previously.
7 mm short axis hepatoduodenal ligament lymph node on 56/18 was 4 mm
previously. Small retroaortic lymph nodes are minimally bigger in
the interval.

Paraesophageal varices evident. Bilateral common iliac artery stents
are again noted.

Other: No intraperitoneal free fluid.

Musculoskeletal: No worrisome lytic or sclerotic osseous
abnormality.
IMPRESSION: 1. No substantial interval change in the pancreatic head lesion with
diffuse pancreatic parenchymal atrophy and mild diffuse distention
of the main pancreatic duct.
2. Interval development of several tiny hypervascular lesions in the
right liver, indeterminate but metastatic disease not excluded.
3. Progression of hepatoduodenal ligament and retroperitoneal
lymphadenopathy, concerning for metastatic disease.
4. Abnormal soft tissue abuts the celiac axis over a circumference
of 180 degrees from [DATE] to [DATE] positions. Imaging features
relatively stable in the interval.
5. Fat planes around the SMA appear preserved.
6. Marked attenuation of the main portal vein with only a string of
patent lumen visible.
7. Paraesophageal varices with borderline splenomegaly evident.
8. Abdominal aortic aneurysm measuring up to 3.5 cm, similar to
prior.
9. Aortic Atherosclerosis ([RF]-[RF]).

## 2020-11-15 MED ORDER — IOHEXOL 350 MG/ML SOLN
75.0000 mL | Freq: Once | INTRAVENOUS | Status: AC | PRN
  Administered 2020-11-15: 75 mL via INTRAVENOUS

## 2020-11-15 NOTE — Consult Note (Signed)
NEW PATIENT EVALUATION  Name: Shirley Barton  MRN: [id number]  Date:   11/15/2020     DOB: [birth year]   This 70 y.o. female patient presents to the clinic for initial evaluation of stage I adenocarcinoma the pancreas  REFERRING PHYSICIAN: Erma Heritage, MD  CHIEF COMPLAINT:  Chief Complaint  Patient presents with   Consult    DIAGNOSIS: There were no encounter diagnoses.   PREVIOUS INVESTIGATIONS:  MRI scan CT scans reviewed Pathology report reviewed Clinical notes reviewed  HPI: Patient is a 70 year old female presented back in May 2022 with obstructive jaundice.  MRI scan showed a 1.8 cm mass in the pancreatic head.  She underwent endoscopic ultrasound at [facility] showing tumor abutment of portal vein and SMV with no lymphadenopathy.  Her CA 19-9 was 163 she underwent stenting in May 25.  Initial endoscopy showed a round mass identified in the pancreatic head which was hypoechoic.  No sonographic evidence of suggest invasion into the portal vein and superior mesenteric vein.  Dr. Manuella Ghazi at [facility] was concerned with tumor abutting celiac axis and, with hepatic artery and portal vein superior mesenteric vein considered to be borderline resectable.  She was started on FOLFIRINOX and after 1 cycle discontinued because of poor GI tolerance.  She was started on gem Abraxane.  She has had no chemotherapy for the last month.  She was experiencing paraneoplastic symptoms make her toleration of chemotherapy poor.  She is anorexic at this time having some intermittent abdominal pain.  I been asked to evaluate her for possibility of radiation with concurrent chemotherapy.  PLANNED TREATMENT REGIMEN: Concurrent chemoradiation  PAST MEDICAL HISTORY:  has a past medical history of Abdominal pain, generalized, Anxiety, Atypical chest pain, Back pain, Basal cell carcinoma of skin (2013), COPD (chronic obstructive pulmonary disease) ([place]), Depression, DOE (dyspnea on exertion), GERD (gastroesophageal reflux  disease), Hypertension, Pancreatic cancer ([place]), and Shortness of breath dyspnea.    PAST SURGICAL HISTORY:  Past Surgical History:  Procedure Laterality Date   BASAL CELL CARCINOMA EXCISION  2013   CARPAL TUNNEL RELEASE Left 90s   CHOLECYSTECTOMY N/A 09/16/2015   Procedure: LAPAROSCOPIC CHOLECYSTECTOMY WITH INTRAOPERATIVE CHOLANGIOGRAM;  Surgeon: Robert Bellow, MD;  Location: [facility] ORS;  Service: General;  Laterality: N/A;   COLONOSCOPY  1990"s   [place]   ENDOSCOPIC RETROGRADE CHOLANGIOPANCREATOGRAPHY (ERCP) WITH PROPOFOL N/A 11/10/2020   Procedure: ENDOSCOPIC RETROGRADE CHOLANGIOPANCREATOGRAPHY (ERCP) WITH PROPOFOL;  Surgeon: Lucilla Lame, MD;  Location: [facility] ENDOSCOPY;  Service: Endoscopy;  Laterality: N/A;   ERCP N/A 08/05/2020   Procedure: ENDOSCOPIC RETROGRADE CHOLANGIOPANCREATOGRAPHY (ERCP);  Surgeon: Lucilla Lame, MD;  [facility] ENDOSCOPY;  Service: Endoscopy;  Laterality: N/A;   ESOPHAGOGASTRODUODENOSCOPY (EGD) WITH PROPOFOL N/A 02/05/2017   Procedure: ESOPHAGOGASTRODUODENOSCOPY (EGD) WITH PROPOFOL;  Surgeon: Lollie Sails, MD;  Location: [facility] ENDOSCOPY;  Service: Endoscopy;  Laterality: N/A;   PERIPHERAL VASCULAR CATHETERIZATION Bilateral 12/27/2015   Procedure: Lower Extremity Angiography;  Surgeon: Katha Cabal, MD;  Location: [facility] LAB;  Service: Cardiovascular;  Laterality: Bilateral;   PORTA CATH INSERTION N/A 09/08/2020   Procedure: PORTA CATH INSERTION;  Surgeon: Algernon Huxley, MD;  Location: [place] [facility] LAB;  Service: Cardiovascular;  Laterality: N/A;   TONSILLECTOMY      FAMILY HISTORY: family history includes Cerebral palsy in her daughter; Colon polyps in her sister; Depression in her son; Leukemia in her daughter.  SOCIAL HISTORY:  reports that she quit smoking about 8 years ago. Her smoking use included cigarettes. She has  never used smokeless tobacco. She reports that she does not drink alcohol and does not use drugs.  ALLERGIES:  Tramadol and Penicillins  MEDICATIONS:  Current Outpatient Medications  Medication Sig Dispense Refill   acetaminophen (TYLENOL) 325 MG tablet Take 650 mg by mouth daily.     b complex vitamins capsule Take 1 capsule by mouth daily.     B Complex-C-Folic Acid (B COMPLEX-VITAMIN C-FOLIC ACID) 1 MG tablet Take 1 tablet by mouth daily with breakfast. 30 tablet 0   cetirizine (ZYRTEC) 10 MG tablet Take 10 mg by mouth daily.     Fluticasone-Salmeterol (ADVAIR) 100-50 MCG/DOSE AEPB Inhale 1 puff into the lungs 2 (two) times daily.     HYDROcodone-acetaminophen (NORCO) 5-325 MG tablet Take 1 tablet by mouth every 6 (six) hours as needed for moderate pain. 45 tablet 0   KLOR-CON M20 20 MEQ tablet TAKE 1 TABLET BY MOUTH TWICE A DAY 60 tablet 0   lidocaine-prilocaine (EMLA) cream Apply 30 -45 mins prior to port access. 30 g 0   magic mouthwash w/lidocaine SOLN Take 10 mLs by mouth every 2 (two) hours as needed for mouth pain. 360 mL 0   megestrol (MEGACE ES) 625 MG/5ML suspension Take 5 mLs (625 mg total) by mouth daily. 150 mL 0   ondansetron (ZOFRAN ODT) 4 MG disintegrating tablet Take 1 tablet (4 mg total) by mouth every 6 (six) hours as needed for nausea or vomiting. 20 tablet 0   ondansetron (ZOFRAN) 8 MG tablet One pill every 8 hours as needed for nausea/vomitting. 40 tablet 3   pantoprazole (PROTONIX) 40 MG tablet Take by mouth.     predniSONE (DELTASONE) 20 MG tablet Take 1 tablet (20 mg total) by mouth daily with breakfast. Once a day with food. 30 tablet 0   prochlorperazine (COMPAZINE) 10 MG tablet Take 1 tablet (10 mg total) by mouth every 6 (six) hours as needed for nausea or vomiting. 40 tablet 3   diphenoxylate-atropine (LOMOTIL) 2.5-0.025 MG tablet Take 1 tablet by mouth 4 (four) times daily as needed for diarrhea or loose stools. (Patient not taking: No sig reported) 60 tablet 1   No current facility-administered medications for this encounter.    ECOG PERFORMANCE STATUS:  1 -  Symptomatic but completely ambulatory  REVIEW OF SYSTEMS: Patient denies any weight loss, fatigue, weakness, fever, chills or night sweats. Patient denies any loss of vision, blurred vision. Patient denies any ringing  of the ears or hearing loss. No irregular heartbeat. Patient denies heart murmur or history of fainting. Patient denies any chest pain or pain radiating to her upper extremities. Patient denies any shortness of breath, difficulty breathing at night, cough or hemoptysis. Patient denies any swelling in the lower legs. Patient denies any nausea vomiting, vomiting of blood, or coffee ground material in the vomitus. Patient denies any stomach pain. Patient states has had normal bowel movements no significant constipation or diarrhea. Patient denies any dysuria, hematuria or significant nocturia. Patient denies any problems walking, swelling in the joints or loss of balance. Patient denies any skin changes, loss of hair or loss of weight. Patient denies any excessive worrying or anxiety or significant depression. Patient denies any problems with insomnia. Patient denies excessive thirst, polyuria, polydipsia. Patient denies any swollen glands, patient denies easy bruising or easy bleeding. Patient denies any recent infections, allergies or URI. Patient "s visual fields have not changed significantly in recent time.   PHYSICAL EXAM: BP 134/81   Pulse (!) 111  Temp 98.7 F (37.1 C) (Tympanic)   Wt 94 lb 14.4 oz (43 kg)   LMP 01/03/1995 (Approximate)   BMI 16.29 kg/m  Thin frail female in NAD.  Well-developed well-nourished patient in NAD. HEENT reveals PERLA, EOMI, discs not visualized.  Oral cavity is clear. No oral mucosal lesions are identified. Neck is clear without evidence of cervical or supraclavicular adenopathy. Lungs are clear to A&P. Cardiac examination is essentially unremarkable with regular rate and rhythm without murmur rub or thrill. Abdomen is benign with no organomegaly or  masses noted. Motor sensory and DTR levels are equal and symmetric in the upper and lower extremities. Cranial nerves II through XII are grossly intact. Proprioception is intact. No peripheral adenopathy or edema is identified. No motor or sensory levels are noted. Crude visual fields are within normal range.  LABORATORY DATA: Pathology report reviewed    RADIOLOGY RESULTS: MRI scan CT scans reviewed compatible with above-stated findings   IMPRESSION: Stage I adenocarcinoma the pancreas borderline resectable poor tolerance to chemotherapy in 70 year old female  PLAN: At this time I have recommended radiation therapy to her pancreas.  I would plan on delivering 54 Gray in 30 fractions using IMRT treatment planning and delivery.  I would choose IMRT to spare critical structures such as liver kidneys adrenal gland spinal cord and small bowel.  Risks and benefits of treatment occluding nausea fatigue alteration of blood counts possible diarrhea all were explained in detail to the patient and her son.  There will be extra effort by both professional staff as well as technical staff to coordinate and manage concurrent chemoradiation and ensuing side effects during her treatments.  I have personally set up and ordered CT simulation for later this week.  We will coordinate chemotherapy with medical oncology.  I would like to take this opportunity to thank you for allowing me to participate in the care of your patient.Noreene Filbert, MD

## 2020-11-16 ENCOUNTER — Institutional Professional Consult (permissible substitution): Payer: Medicare HMO | Admitting: Radiation Oncology

## 2020-11-17 ENCOUNTER — Telehealth: Payer: Self-pay | Admitting: Internal Medicine

## 2020-11-17 ENCOUNTER — Ambulatory Visit
Admission: RE | Admit: 2020-11-17 | Discharge: 2020-11-17 | Disposition: A | Discharge status: Home / Self Care | Payer: Medicare HMO | Source: Ambulatory Visit | Attending: Radiation Oncology | Admitting: Radiation Oncology

## 2020-11-17 DIAGNOSIS — C250 Malignant neoplasm of head of pancreas: Secondary | ICD-10-CM | POA: Insufficient documentation

## 2020-11-17 DIAGNOSIS — Z510 Encounter for antineoplastic radiation therapy: Secondary | ICD-10-CM | POA: Diagnosis present

## 2020-11-17 DIAGNOSIS — K769 Liver disease, unspecified: Secondary | ICD-10-CM

## 2020-11-17 NOTE — Telephone Encounter (Signed)
On 8/18-I spoke with Dr. Manuella Ghazi, [facility]-given the concerning findings of progression noted on the CT scan.  Surgery not an option.  Discussed with his son, [name] re: implications at length.  He will speak to his mother regarding plan for MRI.  S/p radiation evaluation today; and treatment on 8/29.  Discussed with the son that MRI shows lesions consistent with metastatic disease-then question role of radiation.  Will discuss with Dr. Donella Stade after MRI is available.  C- please schedule MRI liver ASAP.  Follow-up as planned  GB  FYI-Dr.Crystal

## 2020-11-21 ENCOUNTER — Ambulatory Visit
Admission: RE | Admit: 2020-11-21 | Discharge: 2020-11-21 | Disposition: A | Discharge status: Home / Self Care | Payer: Medicare HMO | Source: Ambulatory Visit | Attending: Internal Medicine | Admitting: Internal Medicine

## 2020-11-21 DIAGNOSIS — K769 Liver disease, unspecified: Secondary | ICD-10-CM | POA: Diagnosis present

## 2020-11-21 IMAGING — MR MR ABDOMEN WO/W CM
19 series · 48 of 48 positions shown · IV contrast (gadavist)
Comparison: CT abdomen pelvis, [DATE], MR abdomen, [DATE]

CLINICAL DATA: History of pancreatic cancer, liver lesions
identified by prior CT, status post chemotherapy

EXAM:
MRI ABDOMEN WITHOUT AND WITH CONTRAST
TECHNIQUE: Multiplanar multisequence MR imaging of the abdomen was performed
both before and after the administration of intravenous contrast.
CONTRAST:  4mL GADAVIST GADOBUTROL 1 MMOL/ML IV SOLN

[Series 3: T2 · coronal · 6.0mm · 1.12mm/px · 2 of 30 slices shown (1 of 2)]
[im 1/30]
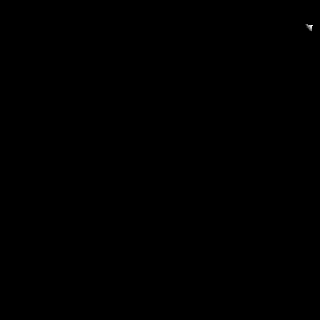
[im 30/30]
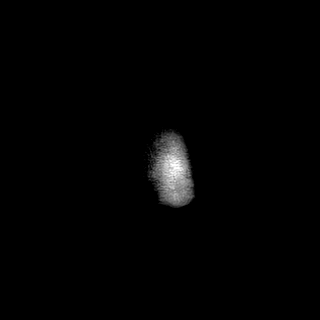

[Series 4: T2 · axial · 6.0mm · 1.12mm/px · z∈[-152,+71]mm · 2 of 32 slices shown (2 of 2)]
[im 1/32]
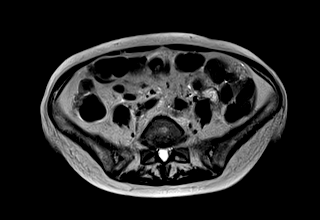
[im 32/32]
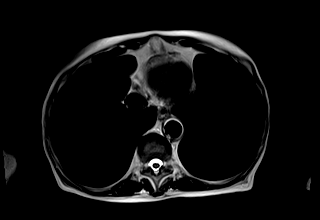

[Series 6: T2 fat-sat · axial · 6.0mm · 1.19mm/px · 1 of 34 slices shown]
[im 1/34]
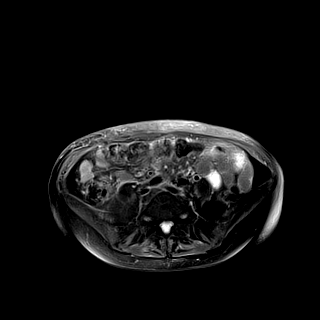

[Series 7: ax dwi_tracew · axial · 6.0mm · 1.42mm/px · z∈[-148,+90]mm · 4 of 102 slices shown]
[im 1/102]
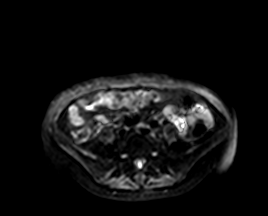
[im 34/102]
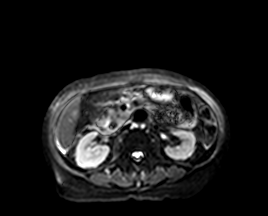
[im 68/102]
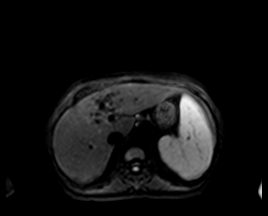
[im 102/102]
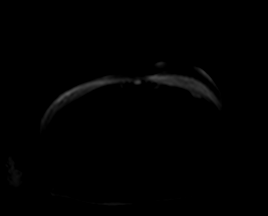

[Series 8: ax dwi_adc · axial · 6.0mm · 1.42mm/px · 1 of 34 slices shown]
[im 1/34]
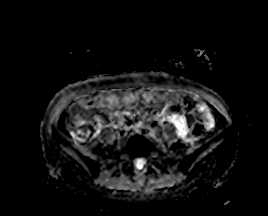

[Series 9: in & out · axial · 3.0mm · 1.12mm/px · z∈[-141,+72]mm · 3 of 72 slices shown (1 of 2)]
[im 1/72]
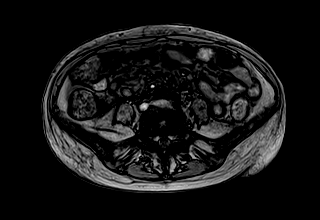
[im 36/72]
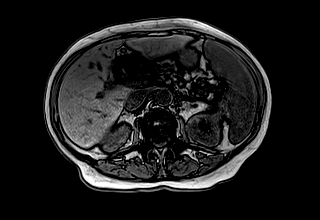
[im 72/72]
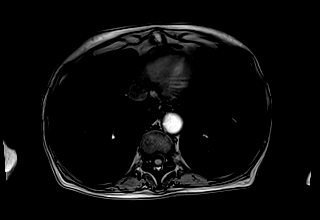

[Series 10: in & out · axial · 3.0mm · 1.12mm/px · z∈[-141,+72]mm · 3 of 72 slices shown (2 of 2)]
[im 1/72]
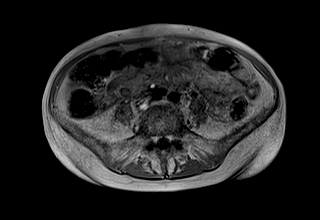
[im 36/72]
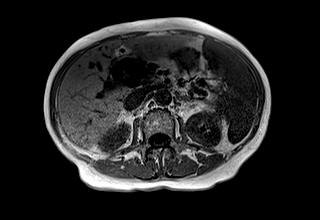
[im 72/72]
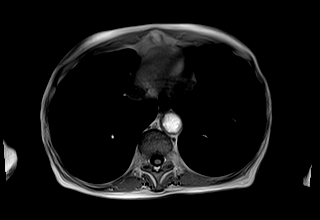

[Series 11: bSSFP · axial · 6.0mm · 0.74mm/px · 1 of 32 slices shown]
[im 1/32]
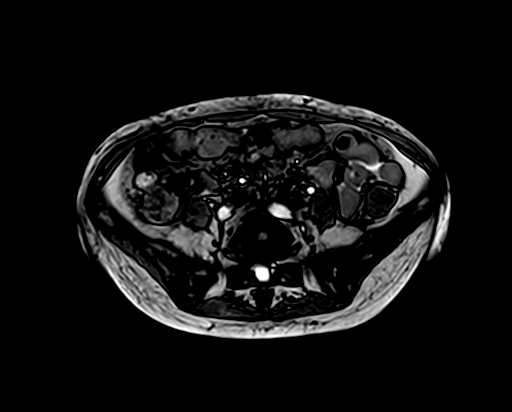

[Series 12: MRCP · coronal · 3.0mm · 1.12mm/px · 1 of 17 slices shown]
[im 1/17]
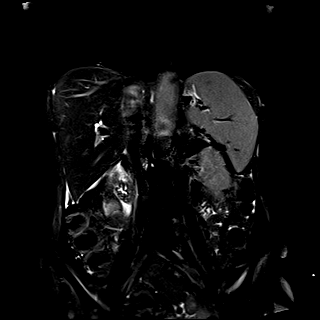

[Series 13: T1 dynamic fat-sat · axial · non-contrast · 3.0mm · 1.12mm/px · z∈[-147,+90]mm · 3 of 80 slices shown (1 of 5)]
[im 1/80]
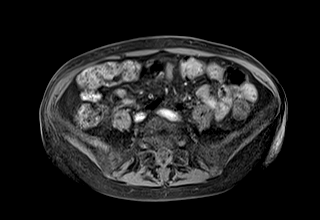
[im 40/80]
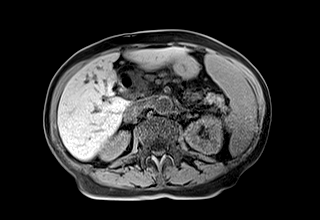
[im 80/80]
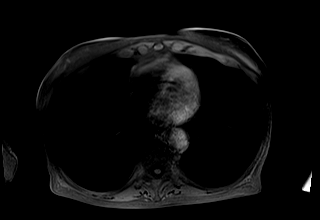

[Series 14: T1 dynamic fat-sat post-contrast · axial · 3.0mm · 1.12mm/px · z∈[-147,+90]mm · 3 of 80 slices shown (1 of 4)]
[im 1/80]
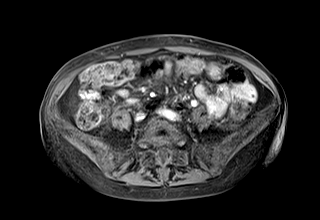
[im 40/80]
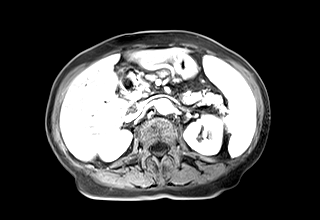
[im 80/80]
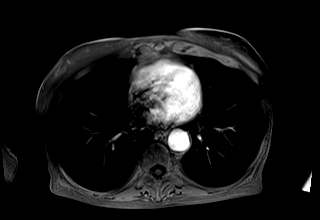

[Series 15: T1 dynamic fat-sat · axial · 3.0mm · 1.12mm/px · z∈[-147,+90]mm · 3 of 80 slices shown (2 of 5)]
[im 1/80]
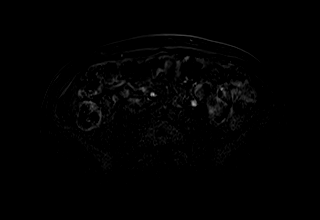
[im 40/80]
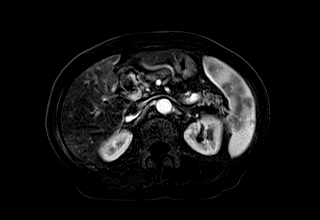
[im 80/80]
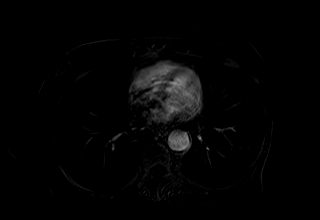

[Series 16: T1 dynamic fat-sat post-contrast · axial · 3.0mm · 1.12mm/px · z∈[-147,+90]mm · 3 of 80 slices shown (2 of 4)]
[im 1/80]
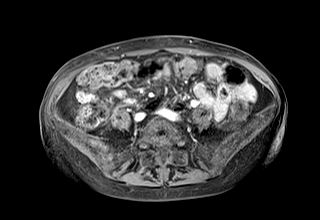
[im 40/80]
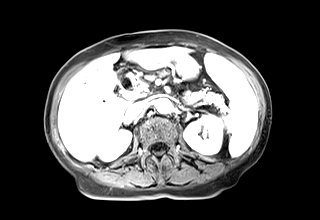
[im 80/80]
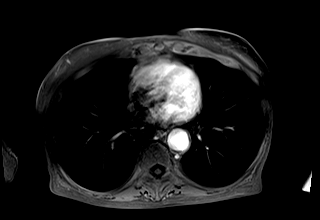

[Series 17: T1 dynamic fat-sat · axial · 3.0mm · 1.12mm/px · z∈[-147,+90]mm · 3 of 80 slices shown (3 of 5)]
[im 1/80]
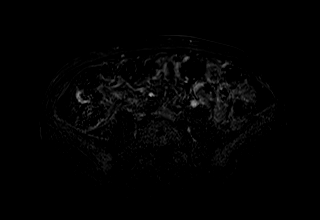
[im 40/80]
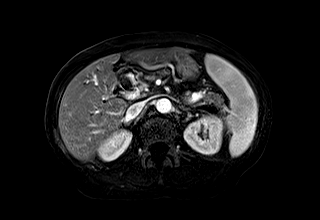
[im 80/80]
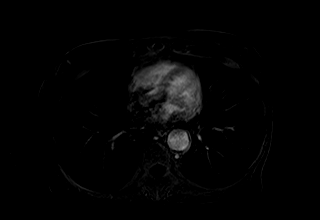

[Series 18: T1 dynamic fat-sat post-contrast · axial · 3.0mm · 1.12mm/px · z∈[-147,+90]mm · 3 of 80 slices shown (3 of 4)]
[im 1/80]
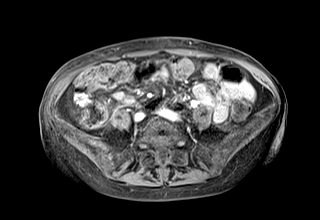
[im 40/80]
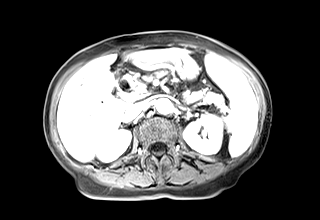
[im 80/80]
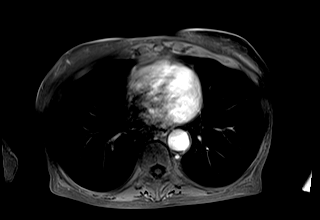

[Series 19: T1 dynamic fat-sat · axial · 3.0mm · 1.12mm/px · z∈[-147,+90]mm · 3 of 80 slices shown (4 of 5)]
[im 1/80]
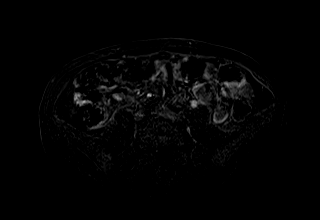
[im 40/80]
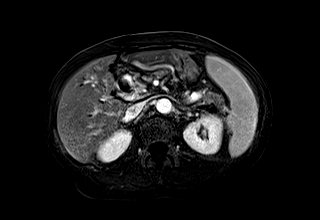
[im 80/80]
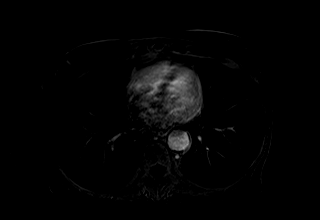

[Series 20: T1 dynamic post-contrast · coronal · 3.0mm · 1.31mm/px · 3 of 72 slices shown]
[im 1/72]
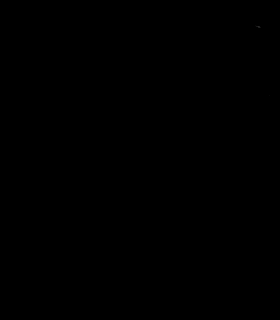
[im 36/72]
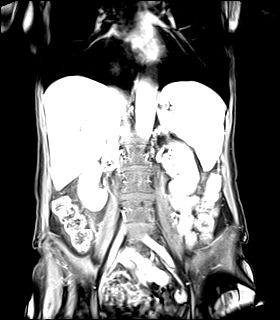
[im 72/72]
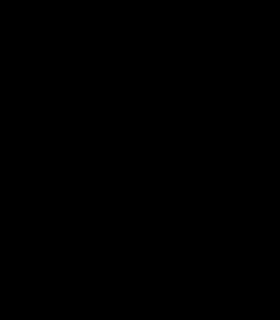

[Series 21: T1 dynamic fat-sat post-contrast · axial · 3.0mm · 1.12mm/px · z∈[-147,+90]mm · 3 of 80 slices shown (4 of 4)]
[im 1/80]
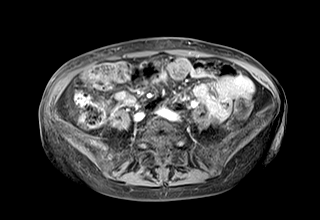
[im 40/80]
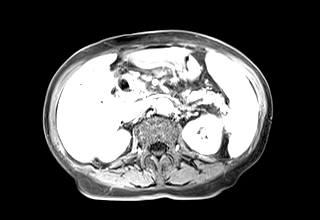
[im 80/80]
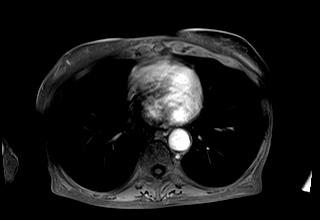

[Series 22: T1 dynamic fat-sat · axial · 3.0mm · 1.12mm/px · z∈[-147,+90]mm · 3 of 80 slices shown (5 of 5)]
[im 1/80]
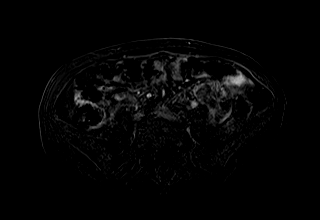
[im 40/80]
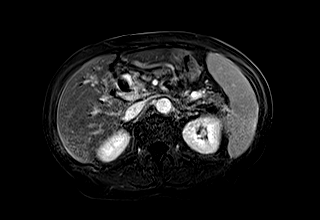
[im 80/80]
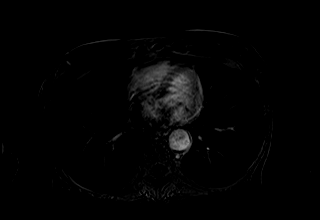

[48 of 48 positions shown; findings below may reference images not displayed]

FINDINGS: Lower chest: No acute findings.

Hepatobiliary: Ill-defined hyperenhancing foci identified by prior
CT correspond to transient, ill-defined foci of arterial
hyperenhancement on MR without corresponding intrinsic signal
abnormality, rim enhancement, or other characteristic features of
metastases (series 14, image 47, 31). There are other subcentimeter
foci of arterial hyperenhancement not appreciated by CT, for example
in the peripheral liver dome, hepatic segment VII (series 14, image
17). At least two of these foci were present on prior MR examination
dated [DATE] and other arterially hyperenhancing lesions seen at
that time are not present on current examination. There is an
additional area of ill-defined, very subtle hyperenhancement of the
inferior right lobe of the liver, hepatic segment VI (series 14,
image 50). Common bile duct stent remains positioned with tip in the
duodenum. Post stenting pneumobilia.

Pancreas: Known pancreatic head mass is poorly appreciated by MR;
primary evidence of mass is occlusion of the central portion of the
portal vein (series 16, image 42). No inflammatory changes, or other
parenchymal abnormality identified. Mild, diffuse pancreatic ductal
dilatation, measuring up to 0.6 cm.

Spleen:  Within normal limits in size and appearance.

Adrenals/Urinary Tract: No masses identified. No evidence of
hydronephrosis.

Stomach/Bowel: Visualized portions within the abdomen are
unremarkable.

Vascular/Lymphatic: Redemonstrated enlarged portacaval lymph nodes
measuring up to 1.3 x 0.7 cm (series 14, image 46). Redemonstrated
enlarged gastrohepatic ligament lymph node, measuring 0.9 x 0.6 cm
(series 14, image 50). Redemonstrated infrarenal abdominal aortic
aneurysm with a large burden of eccentric mural thrombus, measuring
approximately 3.5 x 3.2 cm by MR (series 16, image 68).

Other:  Trace ascites.

Musculoskeletal: No suspicious bone lesions identified.
IMPRESSION: 1. Ill-defined hyperenhancing foci identified by prior CT correspond
to transient, ill-defined foci of arterial hyperenhancement without
corresponding intrinsic signal abnormality, rim enhancement, or
other characteristic features of metastases. There is an additional
area of ill-defined, very subtle hyperenhancement of the inferior
right lobe of the liver, hepatic segment VI. Some of these lesions
were present on prior MR dated [DATE], other lesions present at
that time are no longer noted. Findings are most consistent with
transient hepatic intensity differences, most likely related to
parenchymal perfusion differences in the setting of portal vein
occlusion. Metastatic disease is not favored, although attention is
warranted on follow-up.
2. Known pancreatic head mass is poorly appreciated by MR; primary
evidence of mass is occlusion of the central portion of the portal
vein.
3. Common bile duct stent remains in position.
4. Redemonstrated enlarged portacaval and gastrohepatic ligament
lymph nodes, suspicious for nodal metastatic disease as seen on
prior CT.
5. Redemonstrated infrarenal abdominal aortic aneurysm with a large
burden of eccentric mural thrombus, measuring approximately 3.5 x
3.2 cm by MR.
6. Trace ascites.

Aortic Atherosclerosis ([8N]-[8N]).

## 2020-11-21 MED ORDER — GADOBUTROL 1 MMOL/ML IV SOLN
4.0000 mL | Freq: Once | INTRAVENOUS | Status: AC | PRN
  Administered 2020-11-21: 4 mL via INTRAVENOUS

## 2020-11-23 DIAGNOSIS — Z510 Encounter for antineoplastic radiation therapy: Secondary | ICD-10-CM | POA: Diagnosis not present

## 2020-11-24 ENCOUNTER — Ambulatory Visit: Admission: RE | Admit: 2020-11-24 | Payer: Medicare HMO | Source: Ambulatory Visit

## 2020-11-28 ENCOUNTER — Other Ambulatory Visit: Payer: Self-pay

## 2020-11-28 ENCOUNTER — Inpatient Hospital Stay: Payer: Medicare HMO

## 2020-11-28 ENCOUNTER — Inpatient Hospital Stay (HOSPITAL_BASED_OUTPATIENT_CLINIC_OR_DEPARTMENT_OTHER): Payer: Medicare HMO | Admitting: Internal Medicine

## 2020-11-28 ENCOUNTER — Ambulatory Visit
Admission: RE | Admit: 2020-11-28 | Discharge: 2020-11-28 | Disposition: A | Discharge status: Home / Self Care | Payer: Medicare HMO | Source: Ambulatory Visit | Attending: Radiation Oncology | Admitting: Radiation Oncology

## 2020-11-28 ENCOUNTER — Emergency Department: Payer: Medicare HMO

## 2020-11-28 ENCOUNTER — Observation Stay
Admission: EM | Admit: 2020-11-28 | Discharge: 2020-11-29 | Disposition: A | Discharge status: Home / Self Care | Payer: Medicare HMO | Attending: Internal Medicine | Admitting: Internal Medicine

## 2020-11-28 VITALS — BP 137/86 | HR 105 | Temp 99.0°F | Wt 95.2 lb

## 2020-11-28 DIAGNOSIS — K219 Gastro-esophageal reflux disease without esophagitis: Secondary | ICD-10-CM | POA: Diagnosis present

## 2020-11-28 DIAGNOSIS — J449 Chronic obstructive pulmonary disease, unspecified: Secondary | ICD-10-CM | POA: Diagnosis present

## 2020-11-28 DIAGNOSIS — J441 Chronic obstructive pulmonary disease with (acute) exacerbation: Secondary | ICD-10-CM | POA: Diagnosis not present

## 2020-11-28 DIAGNOSIS — I2510 Atherosclerotic heart disease of native coronary artery without angina pectoris: Secondary | ICD-10-CM | POA: Insufficient documentation

## 2020-11-28 DIAGNOSIS — I10 Essential (primary) hypertension: Secondary | ICD-10-CM | POA: Diagnosis not present

## 2020-11-28 DIAGNOSIS — C250 Malignant neoplasm of head of pancreas: Secondary | ICD-10-CM | POA: Diagnosis present

## 2020-11-28 DIAGNOSIS — R64 Cachexia: Secondary | ICD-10-CM

## 2020-11-28 DIAGNOSIS — Z79899 Other long term (current) drug therapy: Secondary | ICD-10-CM | POA: Insufficient documentation

## 2020-11-28 DIAGNOSIS — Z20822 Contact with and (suspected) exposure to covid-19: Secondary | ICD-10-CM | POA: Insufficient documentation

## 2020-11-28 DIAGNOSIS — I70213 Atherosclerosis of native arteries of extremities with intermittent claudication, bilateral legs: Secondary | ICD-10-CM | POA: Diagnosis present

## 2020-11-28 DIAGNOSIS — Z5111 Encounter for antineoplastic chemotherapy: Secondary | ICD-10-CM | POA: Diagnosis not present

## 2020-11-28 DIAGNOSIS — C787 Secondary malignant neoplasm of liver and intrahepatic bile duct: Secondary | ICD-10-CM | POA: Diagnosis not present

## 2020-11-28 DIAGNOSIS — I739 Peripheral vascular disease, unspecified: Secondary | ICD-10-CM | POA: Diagnosis present

## 2020-11-28 DIAGNOSIS — I214 Non-ST elevation (NSTEMI) myocardial infarction: Secondary | ICD-10-CM | POA: Insufficient documentation

## 2020-11-28 DIAGNOSIS — R0602 Shortness of breath: Secondary | ICD-10-CM | POA: Diagnosis present

## 2020-11-28 DIAGNOSIS — C259 Malignant neoplasm of pancreas, unspecified: Secondary | ICD-10-CM | POA: Diagnosis not present

## 2020-11-28 DIAGNOSIS — F32A Depression, unspecified: Secondary | ICD-10-CM | POA: Diagnosis present

## 2020-11-28 DIAGNOSIS — Z87891 Personal history of nicotine dependence: Secondary | ICD-10-CM | POA: Diagnosis not present

## 2020-11-28 DIAGNOSIS — R0902 Hypoxemia: Secondary | ICD-10-CM

## 2020-11-28 DIAGNOSIS — J9691 Respiratory failure, unspecified with hypoxia: Secondary | ICD-10-CM | POA: Insufficient documentation

## 2020-11-28 DIAGNOSIS — Z510 Encounter for antineoplastic radiation therapy: Secondary | ICD-10-CM | POA: Diagnosis not present

## 2020-11-28 DIAGNOSIS — Z85828 Personal history of other malignant neoplasm of skin: Secondary | ICD-10-CM | POA: Diagnosis not present

## 2020-11-28 DIAGNOSIS — R0789 Other chest pain: Secondary | ICD-10-CM | POA: Diagnosis present

## 2020-11-28 DIAGNOSIS — K8689 Other specified diseases of pancreas: Secondary | ICD-10-CM | POA: Diagnosis present

## 2020-11-28 DIAGNOSIS — E785 Hyperlipidemia, unspecified: Secondary | ICD-10-CM | POA: Diagnosis present

## 2020-11-28 LAB — COMPREHENSIVE METABOLIC PANEL
ALT: 15 U/L (ref 0–44)
ALT: 17 U/L (ref 0–44)
AST: 20 U/L (ref 15–41)
AST: 21 U/L (ref 15–41)
Albumin: 3.6 g/dL (ref 3.5–5.0)
Albumin: 3.4 g/dL — ABNORMAL LOW (ref 3.5–5.0)
Alkaline Phosphatase: 85 U/L (ref 38–126)
Alkaline Phosphatase: 83 U/L (ref 38–126)
Anion gap: 6 (ref 5–15)
Anion gap: 9 (ref 5–15)
BUN: 16 mg/dL (ref 8–23)
BUN: 17 mg/dL (ref 8–23)
CO2: 24 mmol/L (ref 22–32)
CO2: 22 mmol/L (ref 22–32)
Calcium: 9 mg/dL (ref 8.9–10.3)
Calcium: 9 mg/dL (ref 8.9–10.3)
Chloride: 106 mmol/L (ref 98–111)
Chloride: 104 mmol/L (ref 98–111)
Creatinine, Ser: 0.46 mg/dL (ref 0.44–1.00)
Creatinine, Ser: 0.71 mg/dL (ref 0.44–1.00)
GFR, Estimated: >60 mL/min (ref >60)
GFR, Estimated: >60 mL/min (ref >60)
Glucose, Bld: 83 mg/dL (ref 70–99)
Glucose, Bld: 130 mg/dL — ABNORMAL HIGH (ref 70–99)
Potassium: 3.9 mmol/L (ref 3.5–5.1)
Potassium: 4 mmol/L (ref 3.5–5.1)
Sodium: 136 mmol/L (ref 135–145)
Sodium: 135 mmol/L (ref 135–145)
Total Bilirubin: 0.9 mg/dL (ref 0.3–1.2)
Total Bilirubin: 1.3 mg/dL — ABNORMAL HIGH (ref 0.3–1.2)
Total Protein: 7.9 g/dL (ref 6.5–8.1)
Total Protein: 7.5 g/dL (ref 6.5–8.1)

## 2020-11-28 LAB — CBC WITH DIFFERENTIAL/PLATELET
Abs Immature Granulocytes: 0.05 10*3/uL (ref 0.00–0.07)
Basophils Absolute: 0.1 10*3/uL (ref 0.0–0.1)
Basophils Relative: 1 %
Eosinophils Absolute: 0.1 10*3/uL (ref 0.0–0.5)
Eosinophils Relative: 1 %
HCT: 36.2 % (ref 36.0–46.0)
Hemoglobin: 11.4 g/dL — ABNORMAL LOW (ref 12.0–15.0)
Immature Granulocytes: 1 %
Lymphocytes Relative: 33 %
Lymphs Abs: 3.3 10*3/uL (ref 0.7–4.0)
MCH: 29.7 pg (ref 26.0–34.0)
MCHC: 31.5 g/dL (ref 30.0–36.0)
MCV: 94.3 fL (ref 80.0–100.0)
Monocytes Absolute: 0.9 10*3/uL (ref 0.1–1.0)
Monocytes Relative: 10 %
Neutro Abs: 5.5 10*3/uL (ref 1.7–7.7)
Neutrophils Relative %: 54 %
Platelets: 261 10*3/uL (ref 150–400)
RBC: 3.84 MIL/uL — ABNORMAL LOW (ref 3.87–5.11)
RDW: 19.9 % — ABNORMAL HIGH (ref 11.5–15.5)
WBC: 9.9 10*3/uL (ref 4.0–10.5)
nRBC: 0 % (ref 0.0–0.2)

## 2020-11-28 LAB — CBC
HCT: 33.6 % — ABNORMAL LOW (ref 36.0–46.0)
Hemoglobin: 10.8 g/dL — ABNORMAL LOW (ref 12.0–15.0)
MCH: 30.7 pg (ref 26.0–34.0)
MCHC: 32.1 g/dL (ref 30.0–36.0)
MCV: 95.5 fL (ref 80.0–100.0)
Platelets: 217 10*3/uL (ref 150–400)
RBC: 3.52 MIL/uL — ABNORMAL LOW (ref 3.87–5.11)
RDW: 19.9 % — ABNORMAL HIGH (ref 11.5–15.5)
WBC: 10.5 10*3/uL (ref 4.0–10.5)
nRBC: 0 % (ref 0.0–0.2)

## 2020-11-28 LAB — BRAIN NATRIURETIC PEPTIDE: B Natriuretic Peptide: 73.7 pg/mL (ref 0.0–100.0)

## 2020-11-28 LAB — MAGNESIUM: Magnesium: 1.8 mg/dL (ref 1.7–2.4)

## 2020-11-28 LAB — LIPASE, BLOOD: Lipase: 25 U/L (ref 11–51)

## 2020-11-28 LAB — TROPONIN I (HIGH SENSITIVITY): Troponin I (High Sensitivity): 293 ng/L (ref <18)

## 2020-11-28 IMAGING — CT CT ANGIO CHEST
2 of 6 series · 18 of 46 positions shown · IV contrast (APPLIED)
Comparison: None.

CLINICAL DATA: Pancreatic cancer, COPD, shortness of breath,
tachycardia, evaluate for PE

EXAM:
CT ANGIOGRAPHY CHEST WITH CONTRAST
TECHNIQUE: Multidetector CT imaging of the chest was performed using the
standard protocol during bolus administration of intravenous
contrast. Multiplanar CT image reconstructions and MIPs were
obtained to evaluate the vascular anatomy.
CONTRAST:  50mL OMNIPAQUE IOHEXOL 350 MG/ML SOLN

[Series 5: thins · axial · 0.58mm/px · z∈[-521,-216]mm · 16 of 335 slices shown]
[im 15/335  lung]
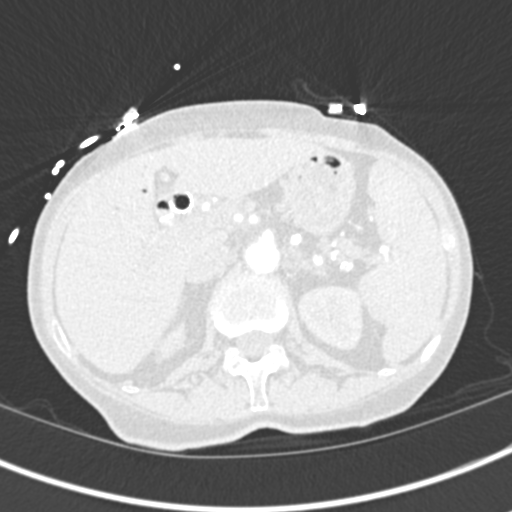
[im 44/335  soft-tissue]
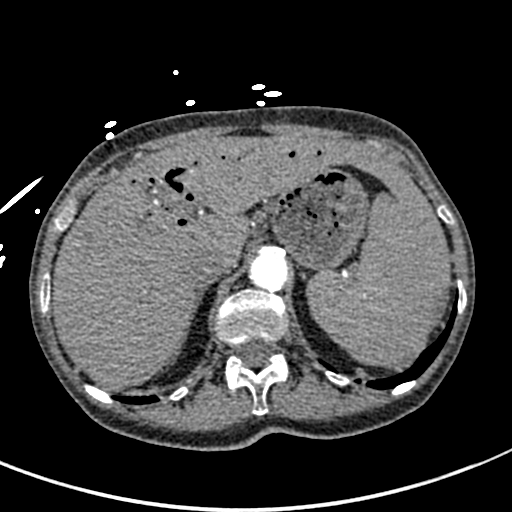
[im 59/335  lung]
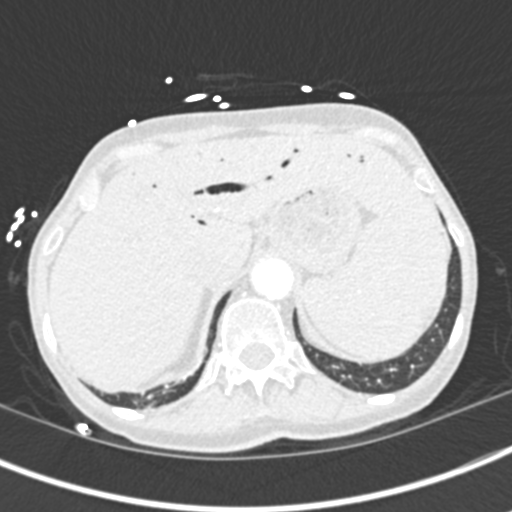
[im 73/335  soft-tissue]
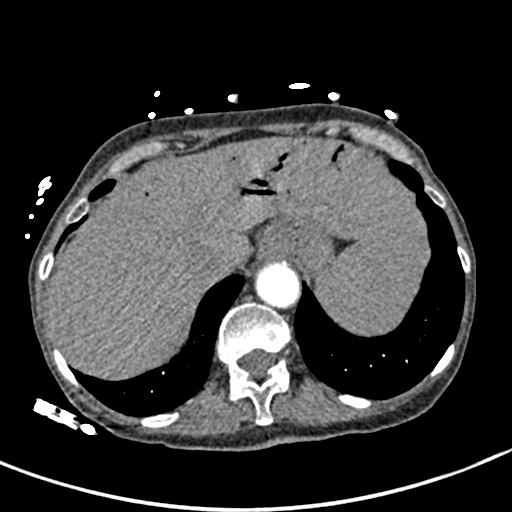
[im 102/335  lung]
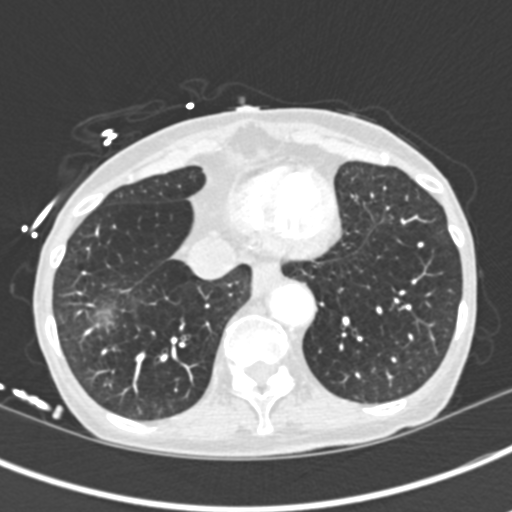
[im 117/335  soft-tissue]
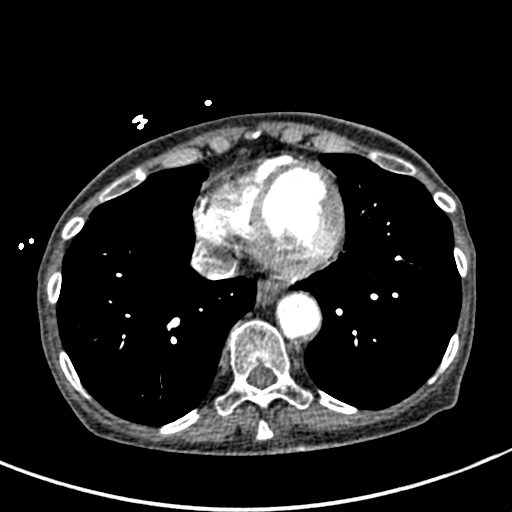
[im 131/335  lung]
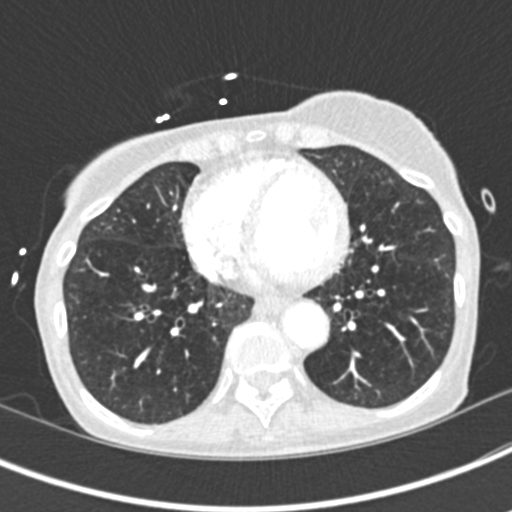
[im 160/335  soft-tissue]
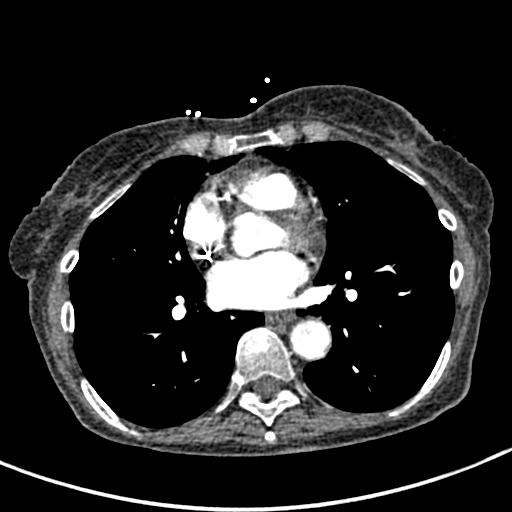
[im 175/335  lung]
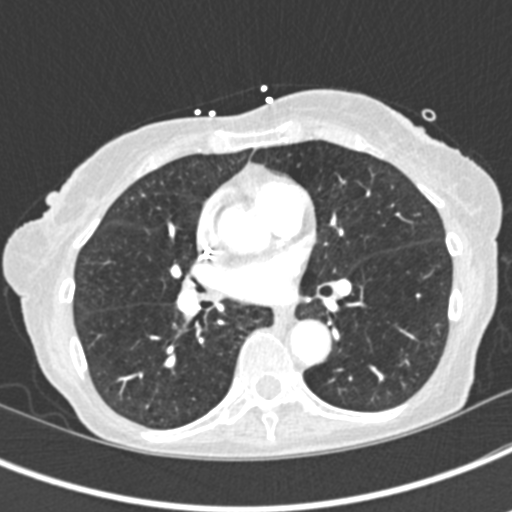
[im 204/335  soft-tissue]
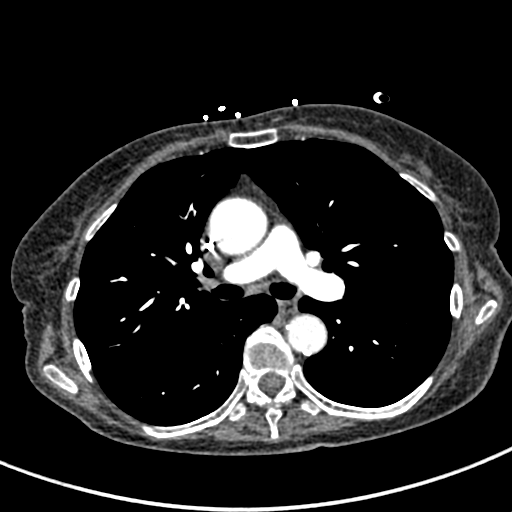
[im 218/335  lung]
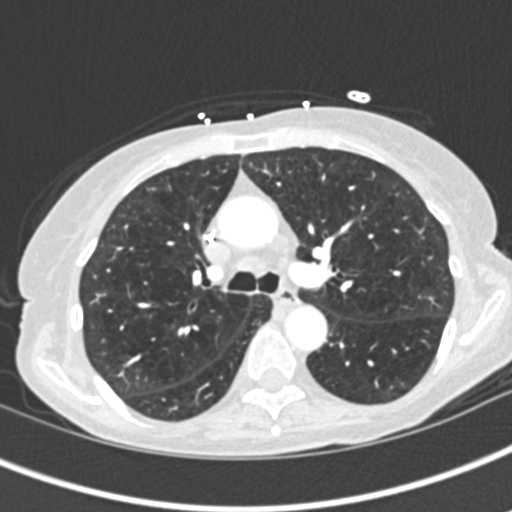
[im 233/335  soft-tissue]
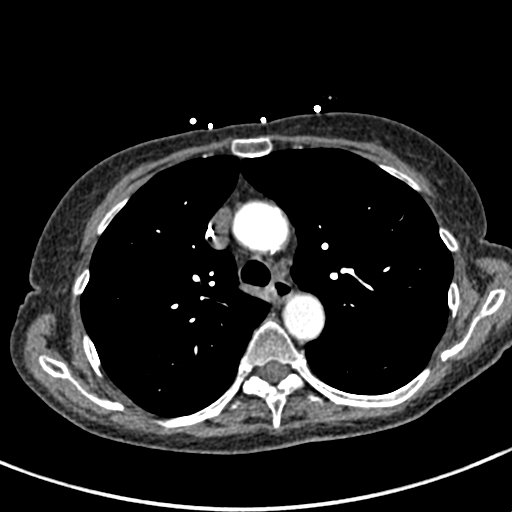
[im 262/335  lung]
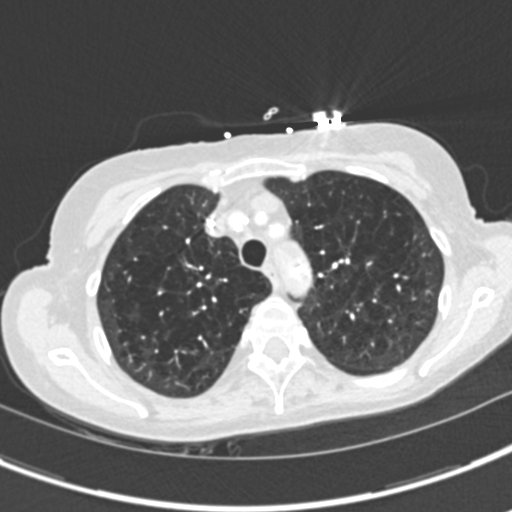
[im 276/335  soft-tissue]
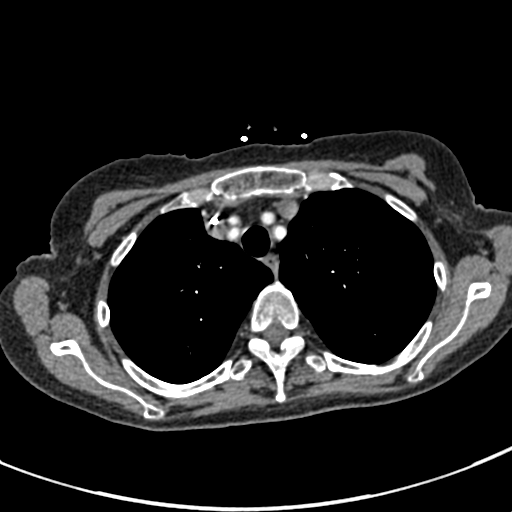
[im 291/335  lung]
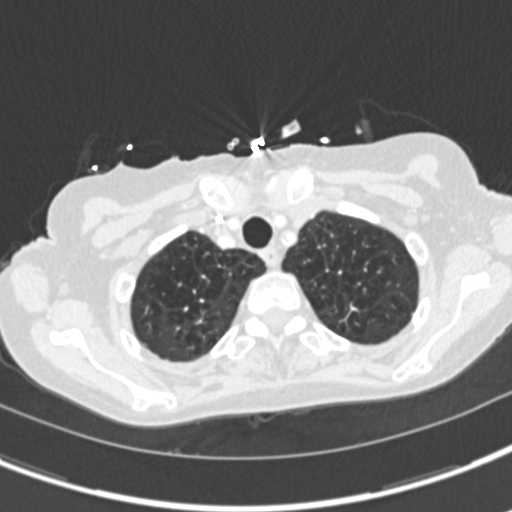
[im 320/335  soft-tissue]
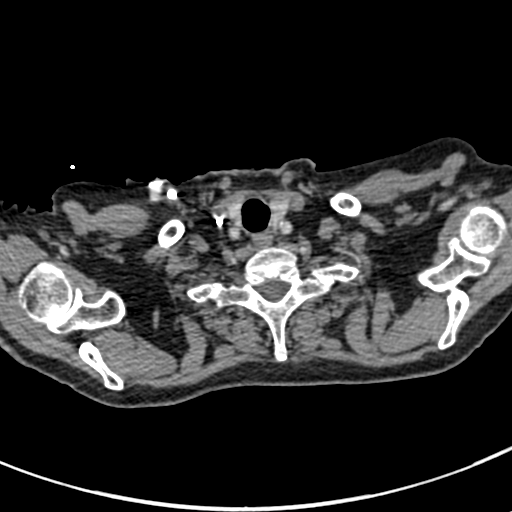

[Series 7: coronal mpr · coronal · 0.66mm/px · 2 of 106 slices shown]
[im 36/106  soft-tissue]
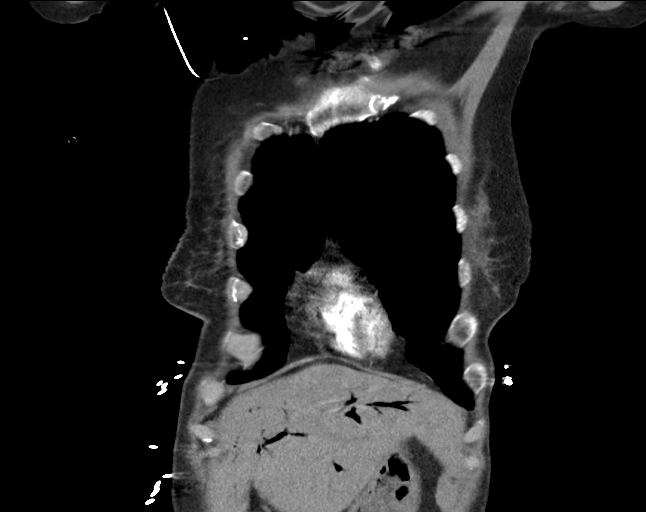
[im 71/106  soft-tissue]
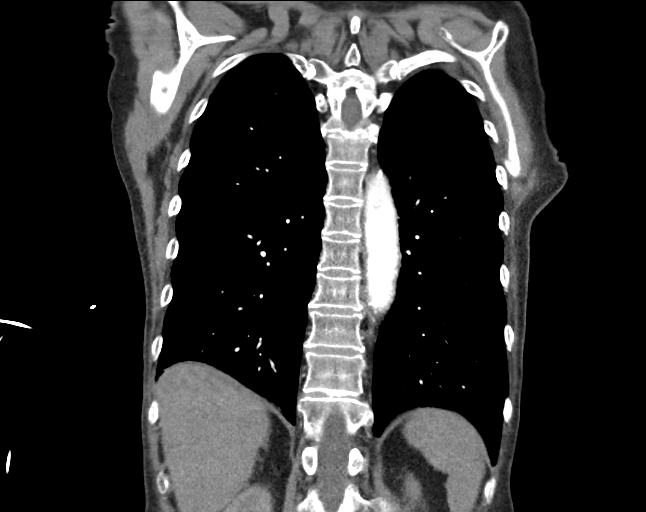

[18 of 46 positions shown; findings below may reference images not displayed]

FINDINGS: Cardiovascular: Satisfactory opacification of the pulmonary arteries
to the segmental level. No evidence of pulmonary embolism. Normal
heart size. Three-vessel coronary artery calcifications. No
pericardial effusion. Aortic atherosclerosis

Mediastinum/Nodes: No enlarged mediastinal, hilar, or axillary lymph
nodes. Small hiatal hernia. Thyroid gland, trachea, and esophagus
demonstrate no significant findings.

Lungs/Pleura: Severe centrilobular emphysema. Diffuse bilateral
bronchial wall thickening. No pleural effusion or pneumothorax.

Upper Abdomen: No acute abnormality. Partially imaged common bile
duct stent and post stenting pneumobilia in the included upper
abdomen.

Musculoskeletal: No chest wall abnormality. No acute or significant
osseous findings.

Review of the MIP images confirms the above findings.
IMPRESSION: 1. Negative examination for pulmonary embolism.
2. Severe centrilobular emphysema with diffuse bilateral bronchial
wall thickening, consistent with nonspecific infectious or
inflammatory bronchitis.
3. Coronary artery disease.
4. Partially imaged common bile duct stent and post stenting
pneumobilia in the included upper abdomen, in keeping with patient
history of pancreatic cancer.

Aortic Atherosclerosis ([F6]-[F6]) and Emphysema ([F6]-[F6]).

## 2020-11-28 IMAGING — DX DG CHEST 1V PORT
2 series · 2 of 2 positions shown · non-contrast
Comparison: None.

CLINICAL DATA: Shortness of breath history of pancreatic carcinoma

EXAM:
PORTABLE CHEST 1 VIEW

[chest ap (1 of 2)]
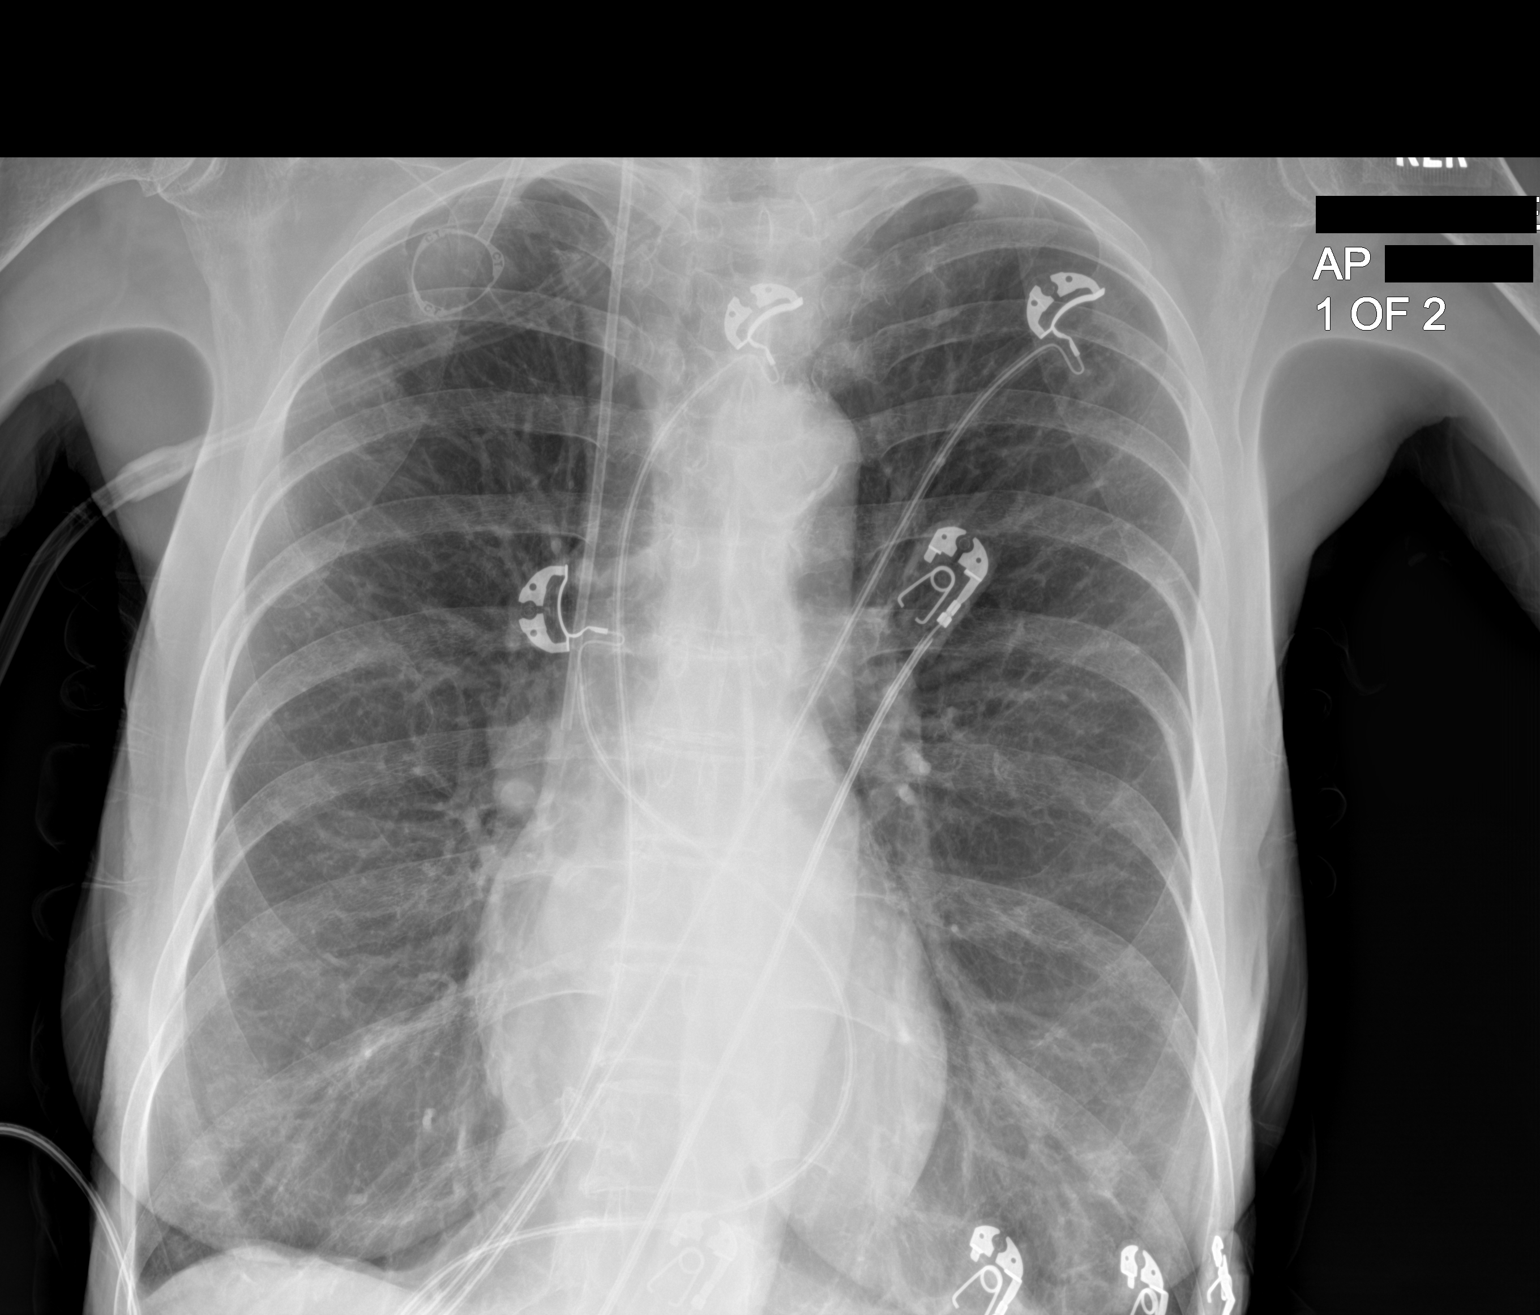

[chest ap (2 of 2)]
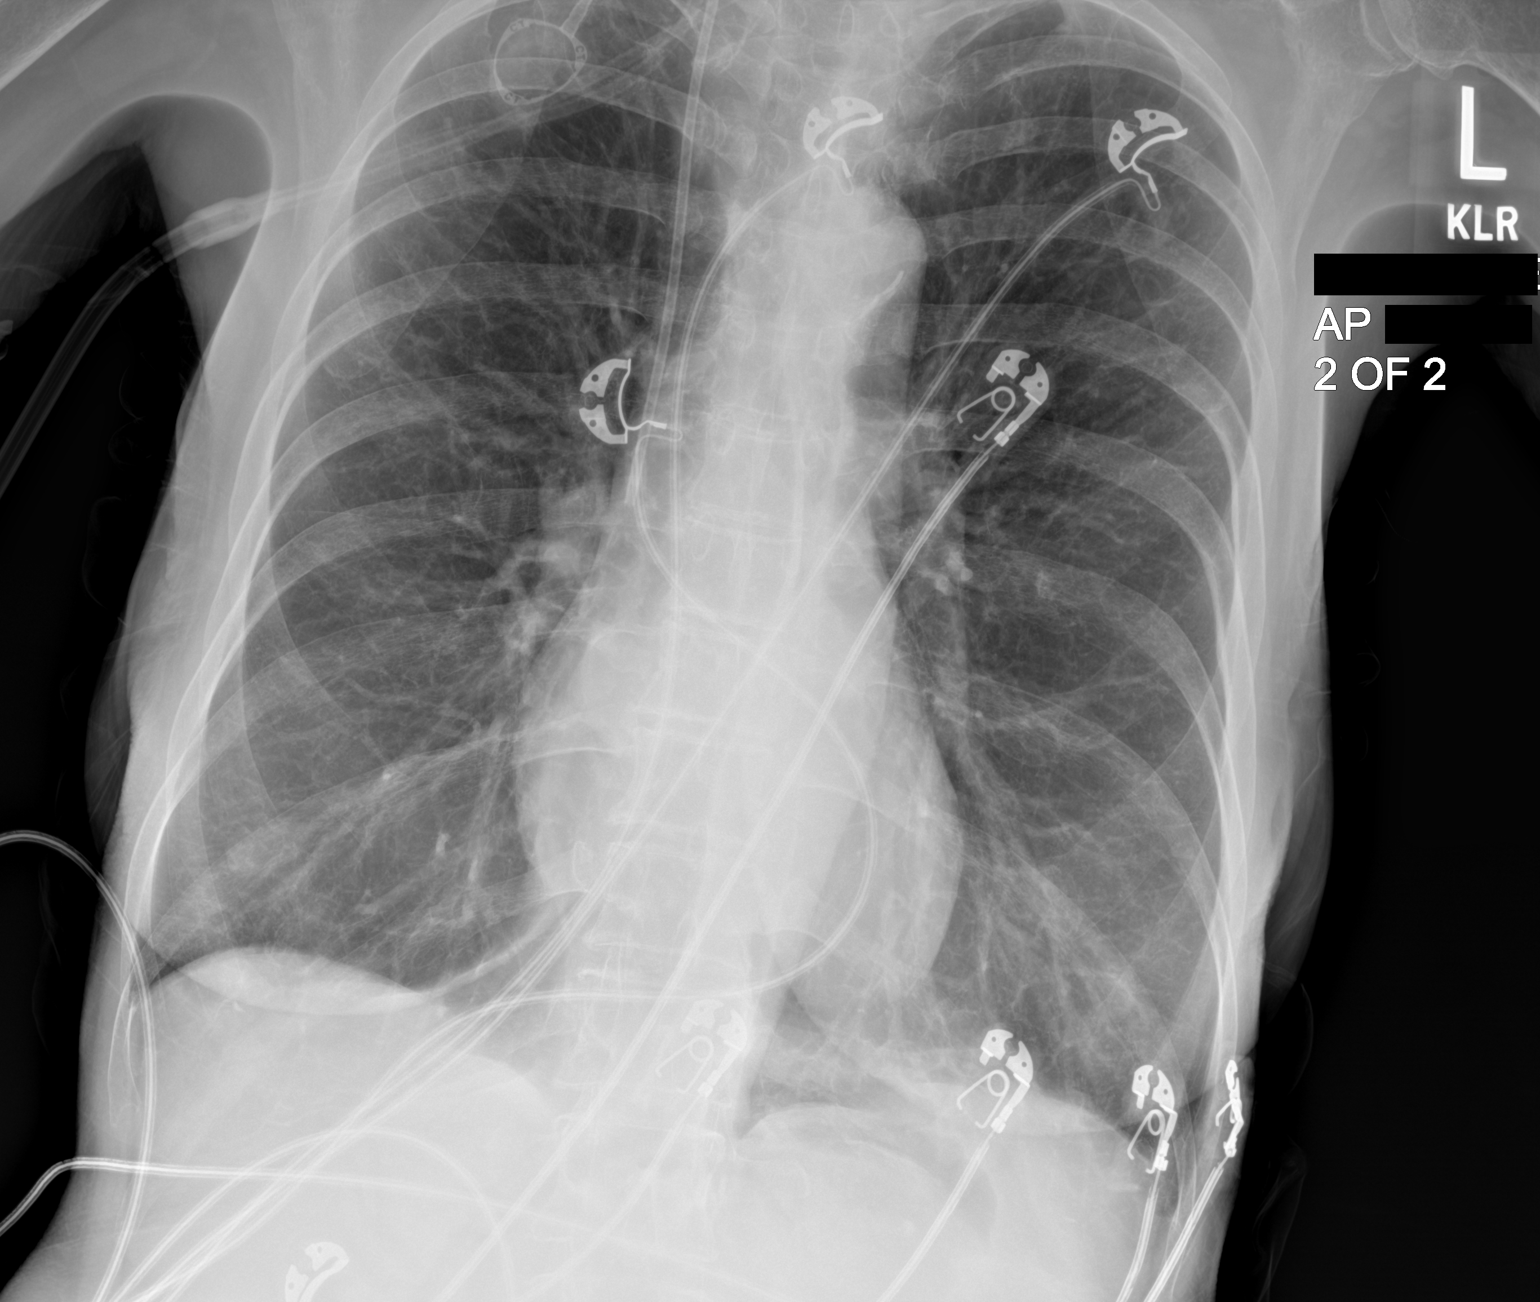

[2 of 2 positions shown; findings below may reference images not displayed]

FINDINGS: Cardiac shadow is within normal limits. Aortic calcifications are
seen. Right chest wall port is noted in satisfactory position. Lungs
are well aerated without focal infiltrate. No acute bony abnormality
is seen.
IMPRESSION: No acute abnormality noted.

## 2020-11-28 MED ORDER — ONDANSETRON HCL 4 MG PO TABS: 4.0000 mg | ORAL_TABLET | Freq: Four times a day (QID) | ORAL | Status: DC | PRN

## 2020-11-28 MED ORDER — ACETAMINOPHEN 325 MG PO TABS: 650.0000 mg | ORAL_TABLET | Freq: Four times a day (QID) | ORAL | Status: DC | PRN

## 2020-11-28 MED ORDER — PROCHLORPERAZINE MALEATE 10 MG PO TABS
10.0000 mg | ORAL_TABLET | Freq: Four times a day (QID) | ORAL | Status: DC | PRN
  Filled 2020-11-28: qty 1

## 2020-11-28 MED ORDER — HYDROCODONE-ACETAMINOPHEN 5-325 MG PO TABS: 1.0000 | ORAL_TABLET | Freq: Four times a day (QID) | ORAL | 0 refills | Status: DC | PRN

## 2020-11-28 MED ORDER — MORPHINE SULFATE (PF) 4 MG/ML IV SOLN
4.0000 mg | Freq: Once | INTRAVENOUS | Status: AC
  Administered 2020-11-28: 4 mg via INTRAVENOUS
  Filled 2020-11-28: qty 1

## 2020-11-28 MED ORDER — MORPHINE SULFATE (PF) 2 MG/ML IV SOLN: 2.0000 mg | INTRAVENOUS | Status: DC | PRN

## 2020-11-28 MED ORDER — PANTOPRAZOLE SODIUM 40 MG PO TBEC
40.0000 mg | DELAYED_RELEASE_TABLET | Freq: Every day | ORAL | Status: DC
  Administered 2020-11-28 – 2020-11-29 (×2): 40 mg via ORAL
  Filled 2020-11-28 (×2): qty 1

## 2020-11-28 MED ORDER — HEPARIN SODIUM (PORCINE) 5000 UNIT/ML IJ SOLN: 60.0000 [IU]/kg | Freq: Once | INTRAMUSCULAR | Status: DC

## 2020-11-28 MED ORDER — SODIUM CHLORIDE 0.9 % IV SOLN
Freq: Once | INTRAVENOUS | Status: AC
  Filled 2020-11-28: qty 250

## 2020-11-28 MED ORDER — HEPARIN SOD (PORK) LOCK FLUSH 100 UNIT/ML IV SOLN
INTRAVENOUS | Status: AC
  Administered 2020-11-28: 500 [IU]
  Filled 2020-11-28: qty 5

## 2020-11-28 MED ORDER — MEGESTROL ACETATE 400 MG/10ML PO SUSP
625.0000 mg | Freq: Every day | ORAL | Status: DC
  Filled 2020-11-28: qty 20

## 2020-11-28 MED ORDER — MAGIC MOUTHWASH W/LIDOCAINE
10.0000 mL | ORAL | Status: DC | PRN
  Filled 2020-11-28: qty 10

## 2020-11-28 MED ORDER — ACETAMINOPHEN 650 MG RE SUPP: 650.0000 mg | Freq: Four times a day (QID) | RECTAL | Status: DC | PRN

## 2020-11-28 MED ORDER — SODIUM CHLORIDE 0.9% FLUSH
10.0000 mL | Freq: Once | INTRAVENOUS | Status: AC
  Administered 2020-11-28: 10 mL via INTRAVENOUS
  Filled 2020-11-28: qty 10

## 2020-11-28 MED ORDER — ONDANSETRON HCL 4 MG/2ML IJ SOLN
4.0000 mg | Freq: Four times a day (QID) | INTRAMUSCULAR | Status: DC | PRN
Start: 2020-11-28 — End: 2020-11-29

## 2020-11-28 MED ORDER — ASPIRIN 81 MG PO CHEW
324.0000 mg | CHEWABLE_TABLET | Freq: Once | ORAL | Status: AC
  Administered 2020-11-28: 324 mg via ORAL
  Filled 2020-11-28: qty 4

## 2020-11-28 MED ORDER — METHYLPREDNISOLONE SODIUM SUCC 125 MG IJ SOLR
125.0000 mg | Freq: Once | INTRAMUSCULAR | Status: AC
  Administered 2020-11-28: 125 mg via INTRAVENOUS
  Filled 2020-11-28: qty 2

## 2020-11-28 MED ORDER — IPRATROPIUM-ALBUTEROL 0.5-2.5 (3) MG/3ML IN SOLN
6.0000 mL | Freq: Once | RESPIRATORY_TRACT | Status: AC
  Administered 2020-11-28: 6 mL via RESPIRATORY_TRACT
  Filled 2020-11-28: qty 6

## 2020-11-28 MED ORDER — IOHEXOL 350 MG/ML SOLN
50.0000 mL | Freq: Once | INTRAVENOUS | Status: AC | PRN
  Administered 2020-11-28: 50 mL via INTRAVENOUS

## 2020-11-28 MED ORDER — IPRATROPIUM-ALBUTEROL 0.5-2.5 (3) MG/3ML IN SOLN
3.0000 mL | Freq: Three times a day (TID) | RESPIRATORY_TRACT | Status: DC
  Administered 2020-11-28 – 2020-11-29 (×2): 3 mL via RESPIRATORY_TRACT
  Filled 2020-11-28 (×2): qty 3

## 2020-11-28 MED ORDER — HEPARIN SOD (PORK) LOCK FLUSH 100 UNIT/ML IV SOLN
500.0000 [IU] | Freq: Once | INTRAVENOUS | Status: AC | PRN
  Filled 2020-11-28: qty 5

## 2020-11-28 MED ORDER — METHYLPREDNISOLONE SODIUM SUCC 40 MG IJ SOLR
40.0000 mg | Freq: Two times a day (BID) | INTRAMUSCULAR | Status: DC
  Administered 2020-11-29: 40 mg via INTRAVENOUS
  Filled 2020-11-28: qty 1

## 2020-11-28 MED ORDER — PROCHLORPERAZINE MALEATE 10 MG PO TABS
10.0000 mg | ORAL_TABLET | Freq: Once | ORAL | Status: AC
  Administered 2020-11-28: 10 mg via ORAL
  Filled 2020-11-28: qty 1

## 2020-11-28 MED ORDER — HEPARIN BOLUS VIA INFUSION
2500.0000 [IU] | Freq: Once | INTRAVENOUS | Status: AC
  Administered 2020-11-28: 2500 [IU] via INTRAVENOUS
  Filled 2020-11-28: qty 2500

## 2020-11-28 MED ORDER — PANTOPRAZOLE SODIUM 40 MG PO TBEC: 40.0000 mg | DELAYED_RELEASE_TABLET | Freq: Every day | ORAL | 0 refills | Status: DC

## 2020-11-28 MED ORDER — SODIUM CHLORIDE 0.9 % IV SOLN
800.0000 mg | Freq: Once | INTRAVENOUS | Status: AC
  Administered 2020-11-28: 800 mg via INTRAVENOUS
  Filled 2020-11-28: qty 21.04

## 2020-11-28 MED ORDER — MOMETASONE FURO-FORMOTEROL FUM 100-5 MCG/ACT IN AERO
2.0000 | INHALATION_SPRAY | Freq: Two times a day (BID) | RESPIRATORY_TRACT | Status: DC
  Administered 2020-11-28 – 2020-11-29 (×2): 2 via RESPIRATORY_TRACT
  Filled 2020-11-28 (×2): qty 8.8

## 2020-11-28 MED ORDER — HEPARIN (PORCINE) 25000 UT/250ML-% IV SOLN
650.0000 [IU]/h | INTRAVENOUS | Status: DC
  Administered 2020-11-28: 500 [IU]/h via INTRAVENOUS
  Filled 2020-11-28: qty 250

## 2020-11-28 MED ORDER — LACTATED RINGERS IV BOLUS
1000.0000 mL | Freq: Once | INTRAVENOUS | Status: AC
  Administered 2020-11-28: 1000 mL via INTRAVENOUS

## 2020-11-28 MED ORDER — MEGESTROL ACETATE 625 MG/5ML PO SUSP: 625.0000 mg | Freq: Every day | ORAL | Status: DC

## 2020-11-28 MED ORDER — HYDROCODONE-ACETAMINOPHEN 5-325 MG PO TABS: 1.0000 | ORAL_TABLET | Freq: Four times a day (QID) | ORAL | Status: DC | PRN

## 2020-11-28 MED ORDER — SODIUM CHLORIDE 0.9 % IV SOLN: 650.0000 mg | Freq: Once | INTRAVENOUS | Status: DC

## 2020-11-28 NOTE — ED Triage Notes (Addendum)
Pt come with c/o SOB. Pt had radiation and chemo today.   O2 at 88% RA pt has labored breathing noted. Pt states I can't breath., HR-140. Pt placed on 2L with O2 now at 92%  Pt taken to ED 17 at this time by RN Bri  Attempted to call RN and unable to get in touch with pt.

## 2020-11-28 NOTE — Assessment & Plan Note (Addendum)
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 Dr.Shah]-s/p FOLFIRINOX cycle #1/gemcitabine Abraxane [multiple interruptions because of poor tolerance]-August 16 shows no substantial improvement; however concern for development of several tiny hyperplastic lesions of liver-question metastatic disease.  New paraduodenal ligament/retroperitoneal adenopathy.   #Given continued poor tolerance to gemcitabine Abraxane-discontinue therapy.  #Patient status post evaluation with radiation oncology-proceed with Gem [600 mg/m2 weekly x 5-6 weeks with RT- 8/29-10/10].   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    # Chronic Diarrhea/adominal pain - on norco at [facility].Marland Kitchen   #Recurrent biliary obstruction/elevated LFTs-s/p repeat ERCP [AUG 11th, Dr.Wohl]- Improving.   #COPD/currently quit smoking not on home O2; on inhalers-STABLE.   # DISPOSITION:  # gem chemo today # 1 week-Tuesday or wed- lab- cbc/cmp;gem  # Follow up in 2 weeks- MD; labs- cbc/cmp;ca-19-9- gemcitabine chemo- Dr.B  # I reviewed the blood work- with the patient in detail; also reviewed the imaging independently [as summarized above]; and with the patient in detail.

## 2020-11-28 NOTE — H&P (Addendum)
History and Physical   STAPHANIE HARBISON AVW:[id number] DOB: [birth year] DOA: 11/28/2020  PCP: Erma Heritage, MD  Outpatient Specialists: Dr. Rogue Bussing, medical oncology Patient coming from: Home via [place]  I have personally briefly reviewed patient's old medical records in [place].  Chief Concern: Shortness of breath  HPI: Shirley Barton is a 70 y.o. 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  She reports that on day of presentation she completed her radiation treatment as normal and saw her medical oncology provider and was at her normal state of health.  She went to her radiation appointment at 9:45 AM, completed radiation, then had her chemotherapy infusion and completed chemotherapy infusion at approximately 1:35 PM.  Her son took her to [employer] to get some food, they got chicken wings. She ate 3 chicken wings and then she developed shortness of breath. Normally when she has shortness of breath, she will improve with laying down or using her inhaler.   She tried to Advair this time and it did not improve. She states she could not breath in (nose and mouth) and she could not breath out (in and out).  She was so short of breath that she had difficulty moving and did not attempt to lay down.  She endorses feeling tightness as she could not take deep inhalation.  She states she has never experienced this before.   She denies chest pain, substernal or otherwise.  She reports that once she receives her second DuoNeb treatment her breathing got better and the chest tightness with inhalation or exhalation improved significantly.  Her son, [name], took her to the ED, and from time  of eating chicken wings to arrival to ED, it was approximately 1 hour.   She reports that her shortness of breath improved the most with the second breathing treatment of duo-nebs.   She reports she likely lost about 10-12 pounds since starting chemotherapy  Social history: She lives at home by herself. She is a former tobacco user, quit in 2013-2014. At the most, she smoked 1.5-2 ppd. She does not drink etoh, quit drinking at age 70. She denies recreational drug use. 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 She reports that she likely was exposed to noxious/toxic agents at the mills.   Vaccination history: She is vaccinated for covid 19 with one shot of Moderna.   ROS: Constitutional: no weight change, no fever ENT/Mouth: no sore throat, no rhinorrhea Eyes: no eye pain, no vision changes Cardiovascular: no chest pain, + dyspnea,  no edema, no palpitations Respiratory: no cough, no sputum, no wheezing Gastrointestinal: no nausea, no vomiting, no diarrhea, no constipation Genitourinary: no urinary incontinence, no dysuria, no hematuria Musculoskeletal: no arthralgias, no myalgias Skin: no skin lesions, no pruritus, Neuro: + weakness, no loss of consciousness, no syncope Psych: no anxiety, no depression, + on-going decrease appetite Heme/Lymph: no bruising, no bleeding  ED Course: Discussed with emergency medicine provider, patient requiring hospitalization for chief concerns of shortness of breath and chest pain, concerning for NSTEMI and or COPD exacerbation.  Vitals in the emergency department was remarkable for temperature 99, respiration rate of 21, increased heart rate of 105, blood pressure 137/86, SPO2 desatted to 88%  on room air on presentation and improved to 97% on oxygen supplementation.  Labs in the emergency department was remarkable for sodium 135, potassium 4, chloride 104, bicarb 22, BUN 17, serum creatinine of 0.71, nonfasting blood glucose 130, EGFR  greater than 60.  WBC 9.9, hemoglobin 11.4, platelets 261.  High sensitive troponin is 293.  In the emergency department patient was given aspirin 324 mg p.o., heparin GTT initiated, duo nebs x2, lactated Ringer 1 L bolus, Solu-Medrol 125 mg IV, morphine 4 mg IV.  Assessment/Plan  Principal Problem:   Shortness of breath Active Problems:   PAD (peripheral artery disease) (HCC)   Atherosclerosis of native artery of both lower extremities with intermittent claudication (HCC)   Atypical chest pain   Chronic obstructive pulmonary disease (HCC)   Essential (primary) hypertension   HLD (hyperlipidemia)   GERD (gastroesophageal reflux disease)   Depression   Pancreatic mass   Bilirubinemia   Malignant neoplasm of head of pancreas (HCC)   # Shortness of breath-multifactorial at this time in setting of known COPD/emphysema, multivessel disease on CT imaging, and history of known pancreatic cancer with metastasis # Hypoxia secondary to respiratory failure in setting of COPD exacerbation - Continue O2 supplementation via nasal cannula to maintain SPO2 greater than 92%, at bedside, patient is currently requiring 2.5 L nasal cannula - Continue heparin GTT at this time - DuoNebs 3 times daily scheduled, Solu-Medrol 40 mg IV twice daily, 2 doses ordered - Incentive spirometry, flutter valve - Admit to progressive cardiac, observation, telemetry - A.m. team: Patient is requesting duo nebs and rescue inhalers on her discharge  # Pancreatic cancer with metastasis to the liver - Resumed home Norco 5 tablet, p.o., every 6 hours as needed for moderate pain - Morphine 2 mg IV every 4 hours as needed for severe pain ordered, 4 doses She had radiation treatment on 11/29/20 at 11 AM and would like to be discharged before than  # History of biliary obstruction in setting of pancreatic cancer - Status post CBD stent placement - ERCP with Dr. Allen Norris on 11/10/2020: Impression read as a single segmental  biliary stricture was found in the common bile duct. The stricture was malignant appearing. One stent was removed from the biliary tree. One covered metal stent was placed into the common bile duct.  # Elevated LFTs-suspect secondary to biliary obstruction - Alk phos is 218, T bili 1.4, improved from previous labs  # COPD - she states she does not have nebulizers at home and would like to be discharged home with duo-neb or rescue inhaler   # Elevated troponin-presumed secondary to acute respiratory distress/failure with hypoxia causing demand ischemia - Follow-up on high sensitive troponin, BNP was within normal limits - Continue heparin GTT at this time - Given that patient improved significantly with DuoNeb treatment, I have low clinical suspicion for ACS at this time - Patient endorses to me that she would not like to undergo cardiac cath procedure unless absolutely indicated  # GERD-PPI # Moderate to severe protein/calorie malnutrition # COVID PCR is pending  Labs: BMP, CBC, TSH Chart reviewed.   DVT prophylaxis: Heparin GTT Code Status: Full code  Diet: heart healthy Family Communication: Updated son, [name], at bedside Disposition Plan:  Consults called: none at this time Admission status: Progressive cardiac, observation, telemetry ordered for 24 hours  Past Medical History:  Diagnosis Date   Abdominal pain, generalized    Anxiety    Atypical chest pain    Back pain  Basal cell carcinoma of skin 2013   resected from Left scalp area.    COPD (chronic obstructive pulmonary disease) (HCC)    Depression    DOE (dyspnea on exertion)    GERD (gastroesophageal reflux disease)    Hypertension    Pancreatic cancer ([place])    Shortness of breath dyspnea    Past Surgical History:  Procedure Laterality Date   BASAL CELL CARCINOMA EXCISION  2013   CARPAL TUNNEL RELEASE Left 90s   CHOLECYSTECTOMY N/A 09/16/2015   Procedure: LAPAROSCOPIC CHOLECYSTECTOMY WITH INTRAOPERATIVE  CHOLANGIOGRAM;  Surgeon: Robert Bellow, MD;  Location: [facility] ORS;  Service: General;  Laterality: N/A;   COLONOSCOPY  1990"s   [place]   ENDOSCOPIC RETROGRADE CHOLANGIOPANCREATOGRAPHY (ERCP) WITH PROPOFOL N/A 11/10/2020   Procedure: ENDOSCOPIC RETROGRADE CHOLANGIOPANCREATOGRAPHY (ERCP) WITH PROPOFOL;  Surgeon: Lucilla Lame, MD;  Location: [facility] ENDOSCOPY;  Service: Endoscopy;  Laterality: N/A;   ERCP N/A 08/05/2020   Procedure: ENDOSCOPIC RETROGRADE CHOLANGIOPANCREATOGRAPHY (ERCP);  Surgeon: Lucilla Lame, MD;  Location[facility] END[facility];  Service: Endoscopy;  Laterality: N/A;   ESOPHAGOGASTRODUODENOSCOPY (EGD) WITH PROPOFOL N/A 02/05/2017   Procedure: ESOPHAGOGASTRODUODENOSCOPY (EGD) WITH PROPOFOL;  Surgeon: Lollie Sails, MD;  Location: [facility] ENDOSCOPY;  Service: Endoscopy;  Laterality: N/A;   PERIPHERAL VASCULAR CATHETERIZATION Bilateral 12/27/2015   Procedure: Lower Extremity Angiography;  Surgeon: Katha Cabal, MD;  Location: [place] CV LAB;  Service: Cardiovascular;  Laterality: Bilateral;   PORTA CATH INSERTION N/A 09/08/2020   Procedure: PORTA CATH INSERTION;  Surgeon: Algernon Huxley, MD;  Location: [place] [facility];  Service: Cardiovascular;  Laterality: N/A;   TONSILLECTOMY     Social History:  reports that she quit smoking about 8 years ago. Her smoking use included cigarettes. She has never used smokeless tobacco. She reports that she does not drink alcohol and does not use drugs.  Allergies  Allergen Reactions   Tramadol Other (See Comments)    unknown   Penicillins Rash    Pt states that she has been taking this medication and has had not problem   Family History  Problem Relation Age of Onset   Cerebral palsy Daughter    Colon polyps Sister    Leukemia Daughter    Depression Son    Breast cancer Neg Hx    Family history: Family history reviewed and not pertinent  Prior to Admission medications   Medication Sig Start Date End Date Taking? Authorizing  Provider  acetaminophen (TYLENOL) 325 MG tablet Take 650 mg by mouth every 6 (six) hours as needed for mild pain or moderate pain.   Yes [provider]  B Complex-C-Folic Acid (B COMPLEX-VITAMIN C-FOLIC ACID) 1 MG tablet Take 1 tablet by mouth daily with breakfast. 08/07/20  Yes Fritzi Mandes, MD  diphenoxylate-atropine (LOMOTIL) 2.5-0.025 MG tablet Take 1 tablet by mouth 4 (four) times daily as needed for diarrhea or loose stools. 10/10/20  Yes Cammie Sickle, MD  Fluticasone-Salmeterol (ADVAIR) 100-50 MCG/DOSE AEPB Inhale 1 puff into the lungs 2 (two) times daily.   Yes [provider]  HYDROcodone-acetaminophen (NORCO) 5-325 MG tablet Take 1 tablet by mouth every 6 (six) hours as needed for moderate pain. 11/28/20  Yes Cammie Sickle, MD  KLOR-CON M20 20 MEQ tablet TAKE 1 TABLET BY MOUTH TWICE A DAY 11/09/20  Yes Cammie Sickle, MD  lidocaine-prilocaine (EMLA) cream Apply 30 -45 mins prior to port access. 09/08/20  Yes Cammie Sickle, MD  magic mouthwash w/lidocaine SOLN Take 10 mLs by  mouth every 2 (two) hours as needed for mouth pain. 09/16/20  Yes Borders, Kirt Boys, NP  megestrol (MEGACE ES) 625 MG/5ML suspension Take 5 mLs (625 mg total) by mouth daily. 11/14/20  Yes Cammie Sickle, MD  ondansetron (ZOFRAN) 8 MG tablet One pill every 8 hours as needed for nausea/vomitting. 10/17/20  Yes Cammie Sickle, MD  pantoprazole (PROTONIX) 40 MG tablet Take 1 tablet (40 mg total) by mouth daily. 11/28/20  Yes Cammie Sickle, MD  prochlorperazine (COMPAZINE) 10 MG tablet Take 1 tablet (10 mg total) by mouth every 6 (six) hours as needed for nausea or vomiting. 10/17/20  Yes Cammie Sickle, MD  predniSONE (DELTASONE) 20 MG tablet Take 1 tablet (20 mg total) by mouth daily with breakfast. Once a day with food. Patient not taking: No sig reported 10/24/20   Cammie Sickle, MD   Physical Exam: Vitals:   11/28/20 1930 11/28/20 1945 11/28/20  2000 11/28/20 2015  BP:   129/85   Pulse: (!) 111 (!) 105 (!) 105 (!) 106  Resp: (!) _0 SpO2: 97% 100% 100% 100%   Constitutional: appears older than chronological age, frail, cachectic, NAD, calm, comfortable Eyes: PERRL, lids and conjunctivae normal ENMT: Mucous membranes are moist. Posterior pharynx clear of any exudate or lesions. Age-appropriate dentition.  Mild to moderate hearing loss Neck: normal, supple, no masses, no thyromegaly Respiratory: clear to auscultation bilaterally, no wheezing, no crackles. Normal respiratory effort. No accessory muscle use.  Cardiovascular: Regular rate and rhythm, no murmurs / rubs / gallops. No extremity edema. 2+ pedal pulses. No carotid bruits.  Abdomen: Scaphoid abdomen, chronic right upper quadrant tenderness, no masses palpated, no hepatosplenomegaly. Bowel sounds positive.  Musculoskeletal: no clubbing / cyanosis. No joint deformity upper and lower extremities. Good ROM, no contractures, no atrophy. Normal muscle tone.  Skin: no rashes, lesions, ulcers. No induration Neurologic: Sensation intact. Strength 5/5 in all 4.  Psychiatric: Normal judgment and insight. Alert and oriented x 3. Normal mood.   EKG: independently reviewed, showing sinus tachycardia with rate of 129, QTc 428  Chest x-ray on Admission: I personally reviewed and I agree with radiologist reading as below.  CT Angio Chest PE W and/or Wo Contrast  Result Date: 11/28/2020 CLINICAL DATA:  Pancreatic cancer, COPD, shortness of breath, tachycardia, evaluate for PE EXAM: CT ANGIOGRAPHY CHEST WITH CONTRAST TECHNIQUE: Multidetector CT imaging of the chest was performed using the standard protocol during bolus administration of intravenous contrast. Multiplanar CT image reconstructions and MIPs were obtained to evaluate the vascular anatomy. CONTRAST:  68m OMNIPAQUE IOHEXOL 350 MG/ML SOLN COMPARISON:  None. FINDINGS: Cardiovascular: Satisfactory opacification of the pulmonary  arteries to the segmental level. No evidence of pulmonary embolism. Normal heart size. Three-vessel coronary artery calcifications. No pericardial effusion. Aortic atherosclerosis Mediastinum/Nodes: No enlarged mediastinal, hilar, or axillary lymph nodes. Small hiatal hernia. Thyroid gland, trachea, and esophagus demonstrate no significant findings. Lungs/Pleura: Severe centrilobular emphysema. Diffuse bilateral bronchial wall thickening. No pleural effusion or pneumothorax. Upper Abdomen: No acute abnormality. Partially imaged common bile duct stent and post stenting pneumobilia in the included upper abdomen. Musculoskeletal: No chest wall abnormality. No acute or significant osseous findings. Review of the MIP images confirms the above findings. IMPRESSION: 1. Negative examination for pulmonary embolism. 2. Severe centrilobular emphysema with diffuse bilateral bronchial wall thickening, consistent with nonspecific infectious or inflammatory bronchitis. 3. Coronary artery disease. 4. Partially imaged common bile duct stent and post stenting pneumobilia in the included upper  abdomen, in keeping with patient history of pancreatic cancer. Aortic Atherosclerosis (ICD10-I70.0) and Emphysema (ICD10-J43.9). Electronically Signed   By: Eddie Candle M.D.   On: 11/28/2020 19:13   DG Chest Portable 1 View  Result Date: 11/28/2020 CLINICAL DATA:  Shortness of breath history of pancreatic carcinoma EXAM: PORTABLE CHEST 1 VIEW COMPARISON:  None. FINDINGS: Cardiac shadow is within normal limits. Aortic calcifications are seen. Right chest wall port is noted in satisfactory position. Lungs are well aerated without focal infiltrate. No acute bony abnormality is seen. IMPRESSION: No acute abnormality noted. Electronically Signed   By: Inez Catalina M.D.   On: 11/28/2020 16:31    Labs on Admission: I have personally reviewed following labs  CBC: Recent Labs  Lab 11/28/20 1042 11/28/20 2009  WBC 9.9 10.5  NEUTROABS 5.5   --   HGB 11.4* 10.8*  HCT 36.2 33.6*  MCV 94.3 95.5  PLT 261 672   Basic Metabolic Panel: Recent Labs  Lab 11/28/20 1042 11/28/20 1817  NA 136 135  K 3.9 4.0  CL 106 104  CO2 24 22  GLUCOSE 83 130*  BUN 16 17  CREATININE 0.46 0.71  CALCIUM 9.0 9.0  MG  --  1.8   GFR: Estimated Creatinine Clearance: 45.9 mL/min (by C-G formula based on SCr of 0.71 mg/dL).  Liver Function Tests: Recent Labs  Lab 11/28/20 1042 11/28/20 1817  AST 20 21  ALT 15 17  ALKPHOS 85 83  BILITOT 0.9 1.3*  PROT 7.9 7.5  ALBUMIN 3.6 3.4*   Recent Labs  Lab 11/28/20 1817  LIPASE 25   Urine analysis:    Component Value Date/Time   COLORURINE AMBER (A) 08/04/2020 1138   APPEARANCEUR CLEAR (A) 08/04/2020 1138   APPEARANCEUR Clear 10/06/2015 1406   LABSPEC >1.046 (H) 08/04/2020 1138   PHURINE 5.0 08/04/2020 1138   GLUCOSEU NEGATIVE 08/04/2020 1138   HGBUR SMALL (A) 08/04/2020 1138   BILIRUBINUR MODERATE (A) 08/04/2020 1138   BILIRUBINUR Negative 10/06/2015 1406   KETONESUR 5 (A) 08/04/2020 1138   PROTEINUR NEGATIVE 08/04/2020 1138   UROBILINOGEN 0.2 04/10/2012 0946   NITRITE NEGATIVE 08/04/2020 1138   LEUKOCYTESUR NEGATIVE 08/04/2020 1138   Dr. Tobie Poet [facility]  If 7PM-7AM, please contact overnight-coverage provider If 7AM-7PM, please contact day coverage provider [email or web address]  11/28/2020, 8:33 PM

## 2020-11-28 NOTE — Patient Instructions (Signed)
[facility] [place] [facility]  Discharge Instructions: Thank you for choosing [facility] to provide your oncology and hematology care.  If you have a lab appointment with the Cancer Center, please go directly to the Cancer Center and check in at the registration area.  Wear comfortable clothing and clothing appropriate for easy access to any Portacath or PICC line.   We strive to give you quality time with your provider. You may need to reschedule your appointment if you arrive late (15 or more minutes).  Arriving late affects you and other patients whose appointments are after yours.  Also, if you miss three or more appointments without notifying the office, you may be dismissed from the clinic at the provider's discretion.      For prescription refill requests, have your pharmacy contact our office and allow 72 hours for refills to be completed.    Today you received the following chemotherapy and/or immunotherapy agents Gemzar   To help prevent nausea and vomiting after your treatment, we encourage you to take your nausea medication as directed.  BELOW ARE SYMPTOMS THAT SHOULD BE REPORTED IMMEDIATELY: *FEVER GREATER THAN 100.4 F (38 C) OR HIGHER *CHILLS OR SWEATING *NAUSEA AND VOMITING THAT IS NOT CONTROLLED WITH YOUR NAUSEA MEDICATION *UNUSUAL SHORTNESS OF BREATH *UNUSUAL BRUISING OR BLEEDING *URINARY PROBLEMS (pain or burning when urinating, or frequent urination) *BOWEL PROBLEMS (unusual diarrhea, constipation, pain near the anus) TENDERNESS IN MOUTH AND THROAT WITH OR WITHOUT PRESENCE OF ULCERS (sore throat, sores in mouth, or a toothache) UNUSUAL RASH, SWELLING OR PAIN  UNUSUAL VAGINAL DISCHARGE OR ITCHING   Items with * indicate a potential emergency and should be followed up as soon as possible or go to the Emergency Department if any problems should occur.  Please show the CHEMOTHERAPY ALERT CARD or IMMUNOTHERAPY ALERT CARD at check-in to the  Emergency Department and triage nurse.  Should you have questions after your visit or need to cancel or reschedule your appointment, please contact [facility] [place] [facility]  [phone] and follow the prompts.  Office hours are 8:00 a.m. to 4:30 p.m. Monday - Friday. Please note that voicemails left after 4:00 p.m. may not be returned until the following business day.  We are closed weekends and major holidays. You have access to a nurse at all times for urgent questions. Please call the main number to the clinic [phone] and follow the prompts.  For any non-urgent questions, you may also contact your provider using MyChart. We now offer e-Visits for anyone 18 and older to request care online for non-urgent symptoms. For details visit [email or web address].   Also download the MyChart app! Go to the app store, search "MyChart", open the app, select [facility], and log in with your MyChart username and password.  Due to Covid, a mask is required upon entering the hospital/clinic. If you do not have a mask, one will be given to you upon arrival. For doctor visits, patients may have 1 support person aged 18 or older with them. For treatment visits, patients cannot have anyone with them due to current Covid guidelines and our immunocompromised population.  

## 2020-11-28 NOTE — ED Provider Notes (Signed)
[facility] Emergency Department Provider Note ____________________________________________   Event Date/Time   First MD Initiated Contact with Patient 11/28/20 1600     (approximate)  I have reviewed the triage vital signs and the nursing notes.  HISTORY  Chief Complaint Shortness of Breath   HPI Shirley Barton is a 70 y.o. femalewho presents to the ED for evaluation of SOB.   Chart review indicates primary pancreatic carcinoma abutting associated vasculature without metastatic disease.  Currently on chemotherapy.  COPD.  Normally on room air.  Patient presents to the ED, accompanied by her son, for evaluation of worsening shortness of breath over the past 24 hours.  Son provides majority of history as patient is quite tachypneic and has difficulty providing history.  They report that patient was developing shortness of breath yesterday, but she got radiation this morning and infusion after this, and did okay.  They went to [employer] after this and she became rapidly increasing short of breath that had to come to the hospital immediately.  Patient reports chest tightness associated with this shortness of breath.  Denies fever, cough, syncope or worsening abdominal pain.  Denies emesis, falls or injuries.  Past Medical History:  Diagnosis Date   Abdominal pain, generalized    Anxiety    Atypical chest pain    Back pain    Basal cell carcinoma of skin 2013   resected from Left scalp area.    COPD (chronic obstructive pulmonary disease) (HCC)    Depression    DOE (dyspnea on exertion)    GERD (gastroesophageal reflux disease)    Hypertension    Pancreatic cancer ([place])    Shortness of breath dyspnea     Patient Active Problem List   Diagnosis Date Noted   Shortness of breath 11/28/2020   Obstruction of bile duct    Malignant neoplasm of head of pancreas ([place]) 08/24/2020   Bilirubinemia    Cholestasis    Abdominal pain 08/04/2020   HLD  (hyperlipidemia) 08/04/2020   GERD (gastroesophageal reflux disease) 08/04/2020   Depression 08/04/2020   AAA (abdominal aortic aneurysm) ([place]) 08/04/2020   Pancreatic mass 08/04/2020   Dilated bile duct 08/04/2020   Diarrhea 12/07/2017   Nausea and vomiting 12/07/2017   PAD (peripheral artery disease) ([place]) 01/25/2016   Iliac artery stenosis, bilateral ([place]) 01/25/2016   Atherosclerosis of native artery of both lower extremities with intermittent claudication ([place]) 01/25/2016   Atypical chest pain 10/11/2015   DOE (dyspnea on exertion) 10/11/2015   Chronic abdominal pain 10/06/2015   Chronic obstructive pulmonary disease ([place]) 09/10/2013   Essential (primary) hypertension 06/30/2008    Past Surgical History:  Procedure Laterality Date   BASAL CELL CARCINOMA EXCISION  2013   CARPAL TUNNEL RELEASE Left 90s   CHOLECYSTECTOMY N/A 09/16/2015   Procedure: LAPAROSCOPIC CHOLECYSTECTOMY WITH INTRAOPERATIVE CHOLANGIOGRAM;  Surgeon: Robert Bellow, MD;  Location: [facility] ORS;  Service: General;  Laterality: N/A;   COLONOSCOPY  1990"s   [place]   ENDOSCOPIC RETROGRADE CHOLANGIOPANCREATOGRAPHY (ERCP) WITH PROPOFOL N/A 11/10/2020   Procedure: ENDOSCOPIC RETROGRADE CHOLANGIOPANCREATOGRAPHY (ERCP) WITH PROPOFOL;  Surgeon: Lucilla Lame, MD;  Location: [facility] ENDOSCOPY;  Service: Endoscopy;  Laterality: N/A;   ERCP N/A 08/05/2020   Procedure: ENDOSCOPIC RETROGRADE CHOLANGIOPANCREATOGRAPHY (ERCP);  Surgeon: Lucilla Lame, MD;  Location: [facility] ENDOSCOPY;  Service: Endoscopy;  Laterality: N/A;   ESOPHAGOGASTRODUODENOSCOPY (EGD) WITH PROPOFOL N/A 02/05/2017   Procedure: ESOPHAGOGASTRODUODENOSCOPY (EGD) WITH PROPOFOL;  Surgeon: Lollie Sails, MD;  Location: [facility] ENDOSCOPY;  Service: Endoscopy;  Laterality: N/A;   PERIPHERAL VASCULAR CATHETERIZATION Bilateral 12/27/2015   Procedure: Lower Extremity Angiography;  Surgeon: Katha Cabal, MD;  Location: [place] [facility] LAB;  Service: Cardiovascular;   Laterality: Bilateral;   PORTA CATH INSERTION N/A 09/08/2020   Procedure: PORTA CATH INSERTION;  Surgeon: Algernon Huxley, MD;  Location: [place] [facility];  Service: Cardiovascular;  Laterality: N/A;   TONSILLECTOMY      Prior to Admission medications   Medication Sig Start Date End Date Taking? Authorizing Provider  acetaminophen (TYLENOL) 325 MG tablet Take 650 mg by mouth every 6 (six) hours as needed for mild pain or moderate pain.   Yes [provider]  B Complex-C-Folic Acid (B COMPLEX-VITAMIN C-FOLIC ACID) 1 MG tablet Take 1 tablet by mouth daily with breakfast. 08/07/20  Yes Fritzi Mandes, MD  diphenoxylate-atropine (LOMOTIL) 2.5-0.025 MG tablet Take 1 tablet by mouth 4 (four) times daily as needed for diarrhea or loose stools. 10/10/20  Yes Cammie Sickle, MD  Fluticasone-Salmeterol (ADVAIR) 100-50 MCG/DOSE AEPB Inhale 1 puff into the lungs 2 (two) times daily.   Yes [provider]  HYDROcodone-acetaminophen (NORCO) 5-325 MG tablet Take 1 tablet by mouth every 6 (six) hours as needed for moderate pain. 11/28/20  Yes Cammie Sickle, MD  KLOR-CON M20 20 MEQ tablet TAKE 1 TABLET BY MOUTH TWICE A DAY 11/09/20  Yes Cammie Sickle, MD  lidocaine-prilocaine (EMLA) cream Apply 30 -45 mins prior to port access. 09/08/20  Yes Cammie Sickle, MD  magic mouthwash w/lidocaine SOLN Take 10 mLs by mouth every 2 (two) hours as needed for mouth pain. 09/16/20  Yes Borders, Kirt Boys, NP  megestrol (MEGACE ES) 625 MG/5ML suspension Take 5 mLs (625 mg total) by mouth daily. 11/14/20  Yes Cammie Sickle, MD  ondansetron (ZOFRAN) 8 MG tablet One pill every 8 hours as needed for nausea/vomitting. 10/17/20  Yes Cammie Sickle, MD  pantoprazole (PROTONIX) 40 MG tablet Take 1 tablet (40 mg total) by mouth daily. 11/28/20  Yes Cammie Sickle, MD  prochlorperazine (COMPAZINE) 10 MG tablet Take 1 tablet (10 mg total) by mouth every 6 (six) hours as needed for  nausea or vomiting. 10/17/20  Yes Cammie Sickle, MD  predniSONE (DELTASONE) 20 MG tablet Take 1 tablet (20 mg total) by mouth daily with breakfast. Once a day with food. Patient not taking: No sig reported 10/24/20   Cammie Sickle, MD    Allergies Tramadol and Penicillins  Family History  Problem Relation Age of Onset   Cerebral palsy Daughter    Colon polyps Sister    Leukemia Daughter    Depression Son    Breast cancer Neg Hx     Social History Social History   Tobacco Use   Smoking status: Former    Types: Cigarettes    Quit date: 03/02/2012    Years since quitting: 8.7   Smokeless tobacco: Never  Vaping Use   Vaping Use: Never used  Substance Use Topics   Alcohol use: No    Alcohol/week: 0.0 standard drinks   Drug use: No    Review of Systems  Constitutional: No fever/chills Eyes: No visual changes. ENT: No sore throat. Cardiovascular: Positive for chest pain. Respiratory: Positive for shortness of breath. Gastrointestinal: No abdominal pain.  No nausea, no vomiting.  No diarrhea.  No constipation. Genitourinary: Negative for dysuria. Musculoskeletal: Negative for back pain. Skin: Negative for rash. Neurological: Negative for headaches, focal weakness  or numbness.  ____________________________________________   PHYSICAL EXAM:  VITAL SIGNS: Vitals:   11/28/20 2000 11/28/20 2015  BP: 129/85   Pulse: (!) 105 (!) 106  Resp: 19 17  SpO2: 100% 100%     Constitutional: Alert and oriented.  Cachectic and with alopecia.  Obviously tachypneic and dyspneic.  Speaking in short phrases only. Eyes: Conjunctivae are normal. PERRL. EOMI. Head: Atraumatic. Nose: No congestion/rhinnorhea. Mouth/Throat: Mucous membranes are dry.  Oropharynx non-erythematous. Neck: No stridor. No cervical spine tenderness to palpation. Cardiovascular: Tachycardic rate, regular rhythm. Grossly normal heart sounds.  Good peripheral circulation. Respiratory: Tachypneic to  about 30.  Very poor air movement throughout with nearly silent breath sounds on auscultation.  Intermittent and scattered squeaking wheezes are noted. Gastrointestinal: Soft , nondistended, . No CVA tenderness. Epigastric and periumbilical tenderness without peritoneal features is present.  No lower abdominal tenderness. Musculoskeletal: No lower extremity tenderness nor edema.  No joint effusions. No signs of acute trauma. Neurologic:  Normal speech and language. No gross focal neurologic deficits are appreciated.  Skin:  Skin is warm, dry and intact. No rash noted. Psychiatric: Mood and affect are normal. Speech and behavior are normal.  ____________________________________________   LABS (all labs ordered are listed, but only abnormal results are displayed)  Labs Reviewed  CBC - Abnormal; Notable for the following components:      Result Value   RBC 3.52 (*)    Hemoglobin 10.8 (*)    HCT 33.6 (*)    RDW 19.9 (*)    All other components within normal limits  COMPREHENSIVE METABOLIC PANEL - Abnormal; Notable for the following components:   Glucose, Bld 130 (*)    Albumin 3.4 (*)    Total Bilirubin 1.3 (*)    All other components within normal limits  TROPONIN I (HIGH SENSITIVITY) - Abnormal; Notable for the following components:   Troponin I (High Sensitivity) 293 (*)    All other components within normal limits  RESP PANEL BY RT-PCR (FLU A&B, COVID) ARPGX2  LIPASE, BLOOD  MAGNESIUM  BRAIN NATRIURETIC PEPTIDE  PROTIME-INR  APTT  HEPARIN LEVEL (UNFRACTIONATED)  CBC  BASIC METABOLIC PANEL  TSH  TROPONIN I (HIGH SENSITIVITY)   ____________________________________________  12 Lead EKG  Sinus tachycardia with a rate of 129 bpm.  Poor quality EKG.  Normal axis.  No STEMI.  No gross interval changes ____________________________________________  RADIOLOGY  ED MD interpretation: 1 view CXR hyperinflated without evidence of acute cardiopulmonary pathology.  Official  radiology report(s): CT Angio Chest PE W and/or Wo Contrast  Result Date: 11/28/2020 CLINICAL DATA:  Pancreatic cancer, COPD, shortness of breath, tachycardia, evaluate for PE EXAM: CT ANGIOGRAPHY CHEST WITH CONTRAST TECHNIQUE: Multidetector CT imaging of the chest was performed using the standard protocol during bolus administration of intravenous contrast. Multiplanar CT image reconstructions and MIPs were obtained to evaluate the vascular anatomy. CONTRAST:  6m OMNIPAQUE IOHEXOL 350 MG/ML SOLN COMPARISON:  None. FINDINGS: Cardiovascular: Satisfactory opacification of the pulmonary arteries to the segmental level. No evidence of pulmonary embolism. Normal heart size. Three-vessel coronary artery calcifications. No pericardial effusion. Aortic atherosclerosis Mediastinum/Nodes: No enlarged mediastinal, hilar, or axillary lymph nodes. Small hiatal hernia. Thyroid gland, trachea, and esophagus demonstrate no significant findings. Lungs/Pleura: Severe centrilobular emphysema. Diffuse bilateral bronchial wall thickening. No pleural effusion or pneumothorax. Upper Abdomen: No acute abnormality. Partially imaged common bile duct stent and post stenting pneumobilia in the included upper abdomen. Musculoskeletal: No chest wall abnormality. No acute or significant osseous findings. Review of  the MIP images confirms the above findings. IMPRESSION: 1. Negative examination for pulmonary embolism. 2. Severe centrilobular emphysema with diffuse bilateral bronchial wall thickening, consistent with nonspecific infectious or inflammatory bronchitis. 3. Coronary artery disease. 4. Partially imaged common bile duct stent and post stenting pneumobilia in the included upper abdomen, in keeping with patient history of pancreatic cancer. Aortic Atherosclerosis (ICD10-I70.0) and Emphysema (ICD10-J43.9). Electronically Signed   By: Eddie Candle M.D.   On: 11/28/2020 19:13   DG Chest Portable 1 View  Result Date:  11/28/2020 CLINICAL DATA:  Shortness of breath history of pancreatic carcinoma EXAM: PORTABLE CHEST 1 VIEW COMPARISON:  None. FINDINGS: Cardiac shadow is within normal limits. Aortic calcifications are seen. Right chest wall port is noted in satisfactory position. Lungs are well aerated without focal infiltrate. No acute bony abnormality is seen. IMPRESSION: No acute abnormality noted. Electronically Signed   By: Inez Catalina M.D.   On: 11/28/2020 16:31    ____________________________________________   PROCEDURES and INTERVENTIONS  Procedure(s) performed (including Critical Care):  .1-3 Lead EKG Interpretation  Date/Time: 11/28/2020 4:54 PM Performed by: Vladimir Crofts, MD Authorized by: Vladimir Crofts, MD     Interpretation: abnormal     ECG rate:  124   ECG rate assessment: tachycardic     Rhythm: sinus tachycardia     Ectopy: none     Conduction: normal    Medications  heparin ADULT infusion 100 units/mL (25000 units/258m) (500 Units/hr Intravenous New Bag/Given 11/28/20 2006)  HYDROcodone-acetaminophen (NORCO/VICODIN) 5-325 MG per tablet 1 tablet (has no administration in time range)  prochlorperazine (COMPAZINE) tablet 10 mg (has no administration in time range)  magic mouthwash w/lidocaine (has no administration in time range)  pantoprazole (PROTONIX) EC tablet 40 mg (has no administration in time range)  mometasone-formoterol (DULERA) 100-5 MCG/ACT inhaler 2 puff (has no administration in time range)  acetaminophen (TYLENOL) tablet 650 mg (has no administration in time range)    Or  acetaminophen (TYLENOL) suppository 650 mg (has no administration in time range)  ondansetron (ZOFRAN) tablet 4 mg (has no administration in time range)    Or  ondansetron (ZOFRAN) injection 4 mg (has no administration in time range)  morphine 2 MG/ML injection 2 mg (has no administration in time range)  megestrol (MEGACE) 400 MG/10ML suspension 625 mg (has no administration in time range)   ipratropium-albuterol (DUONEB) 0.5-2.5 (3) MG/3ML nebulizer solution 3 mL (has no administration in time range)  ipratropium-albuterol (DUONEB) 0.5-2.5 (3) MG/3ML nebulizer solution 6 mL (6 mLs Nebulization Given 11/28/20 1620)  methylPREDNISolone sodium succinate (SOLU-MEDROL) 125 mg/2 mL injection 125 mg (125 mg Intravenous Given 11/28/20 1639)  morphine 4 MG/ML injection 4 mg (4 mg Intravenous Given 11/28/20 1639)  ipratropium-albuterol (DUONEB) 0.5-2.5 (3) MG/3ML nebulizer solution 6 mL (6 mLs Nebulization Given 11/28/20 1648)  iohexol (OMNIPAQUE) 350 MG/ML injection 50 mL (50 mLs Intravenous Contrast Given 11/28/20 1853)  lactated ringers bolus 1,000 mL (1,000 mLs Intravenous New Bag/Given 11/28/20 1956)  aspirin chewable tablet 324 mg (324 mg Oral Given 11/28/20 1953)  heparin bolus via infusion 2,500 Units (2,500 Units Intravenous Bolus from Bag 11/28/20 2007)    ____________________________________________   MDM / ED COURSE   70year old woman being treated for pancreatic cancer presents to the ED with shortness of breath and chest pain, found to have evidence of COPD exacerbation and NSTEMI requiring medical admission.  She presents tachycardic and hypoxic, but remains hemodynamically stable.  Exam with stigmata of severe COPD exacerbation.  Considered BiPAP early on,  but she responded well to breathing treatments precluding this.  CXR without infiltrate or PTX.  EKG without ischemia but her troponin is quite elevated to nearly 300 concerning for NSTEMI.  CTA chest obtained without evidence of acute PE.  We will admit to medicine for further work-up and management.  Clinical Course as of 11/28/20 2044  Mon Nov 28, 2020  1612 Discussed plan of care with patient and son at the bedside, who are in agreement.  Also updated nurse on plan of care and need for breathing treatments and possibly BiPAP [DS]  1644 Reassessed.  Remains tachycardic and tachypneic.  We discussed another breathing  treatment.  We discussed need for CTA chest to ensure no PE.  She is in agreement.  We discussed requiring medical admission likely and awaiting work-up.  She is fine with this. [DS]  [id number] There have been some significant delays in patient's blood work due to the lab reportedly losing track of his blood sample as the nurse had sent down multiple hours ago.  Blood was drawn and are now resulting with a troponin of 293. [DS]  [id number]   We will start heparin due to NSTEMI. [DS]    Clinical Course User Index [DS] Vladimir Crofts, MD    ____________________________________________   FINAL CLINICAL IMPRESSION(S) / ED DIAGNOSES  Final diagnoses:  NSTEMI (non-ST elevated myocardial infarction) ([place])  COPD exacerbation ([place])  Hypoxia     ED Discharge Orders     None        Estee Yohe   Note:  This document was prepared using Dragon voice recognition software and may include unintentional dictation errors.    Vladimir Crofts, MD 11/28/20 2045

## 2020-11-28 NOTE — Progress Notes (Signed)
Here for f/u and chemo today. Pt reports right sided abdominal pain and nausea. She is utilizing her Norco and Zofran. Needs a refill on Norco and protonix. States that she had an episode of vertigo last Friday.

## 2020-11-28 NOTE — Progress Notes (Signed)
HR 105 ok to proceed per MD

## 2020-11-28 NOTE — ED Notes (Signed)
RN notified provider that pt has a critical lab value of a troponin of 293. No new orders at this time.

## 2020-11-28 NOTE — Consult Note (Addendum)
ANTICOAGULATION CONSULT NOTE - Initial Consult  Pharmacy Consult for heparin dosing Indication: chest pain/ACS  Allergies  Allergen Reactions   Tramadol Other (See Comments)    unknown   Penicillins Rash    Pt states that she has been taking this medication and has had not problem    Patient Measurements:   Heparin Dosing Weight: 43.2  Vital Signs: Temp: 99 F (37.2 C) (08/29 1126) Temp Source: Tympanic (08/29 1126) BP: 102/72 (08/29 1830) Pulse Rate: 113 (08/29 1830)  Labs: Recent Labs    11/28/20 1042 11/28/20 1817  HGB 11.4*  --   HCT 36.2  --   PLT 261  --   CREATININE 0.46 0.71  TROPONINIHS  --  293*   CrCl: 45.68m/min; Scr (actual) 0.71; (dosing) 0.8  Medical History: Past Medical History:  Diagnosis Date   Abdominal pain, generalized    Anxiety    Atypical chest pain    Back pain    Basal cell carcinoma of skin 2013   resected from Left scalp area.    COPD (chronic obstructive pulmonary disease) (HCC)    Depression    DOE (dyspnea on exertion)    GERD (gastroesophageal reflux disease)    Hypertension    Pancreatic cancer (HCC)    Shortness of breath dyspnea     Medications: (outpt anticoagulant regimen) None On Chemo-hypercoaguable state  Assessment: 70y.o. femalewho presents to the ED for evaluation of SOB post radiation and chemo infusion session. Pharmacy has been consulted for heparin dosing.  Goal of Therapy:  Heparin level 0.3-0.7 units/ml Monitor plt's, CBC and heparin level per protocol   Plan:  Give 2500 units bolus x 1 (60 units/kg) Begin infusion '@500'$  unit/kg/hr. Initial heparin level to be obtained @ 0200 (6hrs post start of infusion).  As indicated thereafter. CBC to be monitored daily while on heparin.  SBerta Minor8/29/2022,7:04 PM

## 2020-11-28 NOTE — Progress Notes (Signed)
[place] NOTE  Patient Care Team: Erma Heritage, MD as PCP - General (Family Medicine) Rico Junker, RN as Registered Nurse Theodore Demark, RN as Registered Nurse Ezzard Standing, PA-C as Physician Assistant (Gastroenterology) Bary Castilla, Forest Gleason, MD (General Surgery) Clent Jacks, RN as Oncology Nurse Navigator Cammie Sickle, MD as Consulting Physician (Hematology and Oncology)  CHIEF COMPLAINTS/PURPOSE OF CONSULTATION: Pancreatic cancer  #  Oncology History Overview Note  # MAY 2022-pancreatic adenocarcinoma [obstructive jaundice-]MRI-18 mm pancreatic head mass; EUS [[facility]; Dr.Spaete]-abutment of portal vein/SMV; no lymphadenopathy noted; May 5 CA 19-9-163; s/p stenting -May 25th-47.   #Obstructive jaundice-s/p ERCP stenting [Dr.Wohl]  MAY 2022- ENDOSONOGRAPHIC FINDING: : A round mass was identified in the pancreatic head.  The mass was hypoechoic. The mass measured 16 mm by 17  mm in maximal cross-sectional diameter. The outer  margins were irregular. There was sonographic evidence  suggesting invasion into the portal vein (manifested  by abutment) and the superior mesenteric vein  (manifested by abutment).   #Pancreatic adenocarcinoma pT1 however on EUS/CT [[facility]. Dr.Shah]-concerning for tumor abutting the celiac axis /common hepatic artery /portal vein/superior mesenteric vein; without any metastatic disease.  Borderline resectable Elevated CA 19-9.  [S/p evaluation at [place]  # June 9th- FOLFIRINOX cycle #1; discontinued because of poor GI tolerance  # &/01-2021-gemcitabine Abraxane ['100mg'$ /2] day 1 day 815  # July 11th-gemcitabine and Abraxane day.  November 15, 2020-CT scan progressive disease/poor tolerance to therapy  # AUG 29th, 2022-gemcitabine single agent weekly-concurrent radiation;  December 12, 2020-discontinued gemcitabine single agent because of significant respiratory distress post gemcitabine infusion.   Continue local radiation ---------------------------------------------------------      Malignant neoplasm of head of pancreas ([place])  08/24/2020 Initial Diagnosis   Malignant neoplasm of head of pancreas ([place])   09/09/2020 - 09/12/2020 Chemotherapy          10/10/2020 -  Chemotherapy    Patient is on Treatment Plan: PANCREATIC ABRAXANE / GEMCITABINE D1,8,15 Q28D          HISTORY OF PRESENTING ILLNESS:  Shirley Barton 70 y.o.  female with a history of COPD; chronic abdominal pain/pancreatic cancer-   In the interim patient was evaluated by GI given elevated LFTs-underwent ERCP had old stent exchanged; noted to have a new stricture-new stent placed.  Patient continues to have poor appetite.  Possible weight loss.  Positive for poor taste.  She has lost about 4 more pounds.  She currently weighs 94 pounds.  Chronic mild abdominal pain.  Chronic nausea.  No fevers or chills.   Review of Systems  Constitutional:  Positive for malaise/fatigue and weight loss. Negative for chills, diaphoresis and fever.  HENT:  Negative for nosebleeds and sore throat.   Eyes:  Negative for double vision.  Respiratory:  Positive for shortness of breath. Negative for cough, hemoptysis, sputum production and wheezing.   Cardiovascular:  Negative for chest pain, palpitations, orthopnea and leg swelling.  Gastrointestinal:  Positive for abdominal pain and diarrhea. Negative for blood in stool, constipation, heartburn, melena and vomiting.  Genitourinary:  Negative for dysuria, frequency and urgency.  Musculoskeletal:  Negative for back pain and joint pain.  Skin: Negative.  Negative for itching and rash.  Neurological:  Negative for dizziness, tingling, focal weakness, weakness and headaches.  Endo/Heme/Allergies:  Does not bruise/bleed easily.  Psychiatric/Behavioral:  Negative for depression. The patient is not nervous/anxious and does not have insomnia.     MEDICAL HISTORY:  Past  Medical History:   Diagnosis Date   Abdominal pain, generalized    Anxiety    Atypical chest pain    Back pain    Basal cell carcinoma of skin 2011/07/13   resected from Left scalp area.    COPD (chronic obstructive pulmonary disease) (HCC)    Depression    DOE (dyspnea on exertion)    GERD (gastroesophageal reflux disease)    Hypertension    Pancreatic cancer ([place])    Shortness of breath dyspnea     SURGICAL HISTORY: Past Surgical History:  Procedure Laterality Date   BASAL CELL CARCINOMA EXCISION  July 13, 2011   CARPAL TUNNEL RELEASE Left 90s   CHOLECYSTECTOMY N/A 09/16/2015   Procedure: LAPAROSCOPIC CHOLECYSTECTOMY WITH INTRAOPERATIVE CHOLANGIOGRAM;  Surgeon: Robert Bellow, MD;  Location: [facility] ORS;  Service: General;  Laterality: N/A;   COLONOSCOPY  1990"s      ENDOSCOPIC RETROGRADE CHOLANGIOPANCREATOGRAPHY (ERCP) WITH PROPOFOL N/A 11/10/2020   Procedure: ENDOSCOPIC RETROGRADE CHOLANGIOPANCREATOGRAPHY (ERCP) WITH PROPOFOL;  Surgeon: Lucilla Lame, MD;  Location: [facility] ENDOSCOPY;  Service: Endoscopy;  Laterality: N/A;   ERCP N/A 08/05/2020   Procedure: ENDOSCOPIC RETROGRADE CHOLANGIOPANCREATOGRAPHY (ERCP);  Surgeon: Lucilla Lame, MD;  Location: [facility] ENDOSCOPY;  Service: Endoscopy;  Laterality: N/A;   ESOPHAGOGASTRODUODENOSCOPY (EGD) WITH PROPOFOL N/A 02/05/2017   Procedure: ESOPHAGOGASTRODUODENOSCOPY (EGD) WITH PROPOFOL;  Surgeon: Lollie Sails, MD;  Location: [facility] END[facility]COPY;  Service: Endoscopy;  Laterality: N/A;   PERIPHERAL VASCULAR CATHETERIZATION Bilateral 12/27/2015   Procedure: Lower Extremity Angiography;  Surgeon: Katha Cabal, MD;  Location: [facility];  Service: Cardiovascular;  Laterality: Bilateral;   PORTA CATH INSERTION N/A 09/08/2020   Procedure: PORTA CATH INSERTION;  Surgeon: Algernon Huxley, MD;  Location: [facility];  Service: Cardiovascular;  Laterality: N/A;   TONSILLECTOMY      SOCIAL HISTORY: Social History   Socioeconomic History   Marital  status: Divorced    Spouse name: Not on file   Number of children: Not on file   Years of education: Not on file   Highest education level: Not on file  Occupational History   Occupation: retired  Tobacco Use   Smoking status: Former    Types: Cigarettes    Quit date: 03/02/2012    Years since quitting: 8.7   Smokeless tobacco: Never  Vaping Use   Vaping Use: Never used  Substance and Sexual Activity   Alcohol use: No    Alcohol/week: 0.0 standard drinks   Drug use: No   Sexual activity: Not on file  Other Topics Concern   Not on file  Social History Narrative   [place]; self; smoker- quit 07/13/2011; daughter died of leukemia-2020. 2 sons [[place]; [place]]; used to be caregiver/ retd.    Social Determinants of Health   Financial Resource Strain: Not on file  Food Insecurity: Not on file  Transportation Needs: Not on file  Physical Activity: Not on file  Stress: Not on file  Social Connections: Not on file  Intimate Partner Violence: Not on file    FAMILY HISTORY: Family History  Problem Relation Age of Onset   Cerebral palsy Daughter    Colon polyps Sister    Leukemia Daughter    Depression Son    Breast cancer Neg Hx     ALLERGIES:  is allergic to tramadol and penicillins.  MEDICATIONS:  Current Outpatient Medications  Medication Sig Dispense Refill   acetaminophen (TYLENOL) 325 MG tablet Take 650 mg by mouth every 6 (six) hours as  needed for mild pain or moderate pain.     B Complex-C-Folic Acid (B COMPLEX-VITAMIN C-FOLIC ACID) 1 MG tablet Take 1 tablet by mouth daily with breakfast. 30 tablet 0   Fluticasone-Salmeterol (ADVAIR) 100-50 MCG/DOSE AEPB Inhale 1 puff into the lungs 2 (two) times daily.     lidocaine-prilocaine (EMLA) cream Apply 30 -45 mins prior to port access. 30 g 0   magic mouthwash w/lidocaine SOLN Take 10 mLs by mouth every 2 (two) hours as needed for mouth pain. 360 mL 0   megestrol (MEGACE ES) 625 MG/5ML suspension Take 5 mLs (625 mg total)  by mouth daily. 150 mL 0   ondansetron (ZOFRAN) 8 MG tablet One pill every 8 hours as needed for nausea/vomitting. 40 tablet 3   prochlorperazine (COMPAZINE) 10 MG tablet Take 1 tablet (10 mg total) by mouth every 6 (six) hours as needed for nausea or vomiting. 40 tablet 3   albuterol (VENTOLIN HFA) 108 (90 Base) MCG/ACT inhaler Inhale 2 puffs into the lungs every 6 (six) hours as needed for wheezing or shortness of breath. 8 g 2   Cetirizine HCl (ZYRTEC ALLERGY PO) Take 1 tablet by mouth daily as needed (allergies).     diphenoxylate-atropine (LOMOTIL) 2.5-0.025 MG tablet Take 1 tablet by mouth 4 (four) times daily as needed for diarrhea or loose stools. 60 tablet 1   HYDROcodone-acetaminophen (NORCO) 5-325 MG tablet Take 1 tablet by mouth every 6 (six) hours as needed for moderate pain. 45 tablet 0   ipratropium-albuterol (DUONEB) 0.5-2.5 (3) MG/3ML SOLN Take 3 mLs by nebulization every 4 (four) hours as needed. 360 mL 0   KLOR-CON M20 20 MEQ tablet TAKE 1 TABLET BY MOUTH TWICE A DAY 60 tablet 0   pantoprazole (PROTONIX) 40 MG tablet Take 1 tablet (40 mg total) by mouth daily. 90 tablet 0   predniSONE (DELTASONE) 10 MG tablet Take 4 tablets ('40mg'$ ) x 2 days, then take 3 tablets ('30mg'$ ) x 2 days, then take 2 tablets ('20mg'$ ) x 2 days, then take 1 tablet ('10mg'$ ) x 2 days, then stop 20 tablet 0   No current facility-administered medications for this visit.      Marland Kitchen  PHYSICAL EXAMINATION: ECOG PERFORMANCE STATUS: 0 - Asymptomatic  Vitals:   11/28/20 1126  BP: 137/86  Pulse: (!) 105  Temp: 99 F (37.2 C)  SpO2: 97%   Filed Weights   11/28/20 1126  Weight: 95 lb 3 oz (43.2 kg)    Physical Exam Constitutional:      Comments: Caucasian female patient ambulating independently.  Accompanied by her son.  HENT:     Head: Normocephalic and atraumatic.     Mouth/Throat:     Pharynx: No oropharyngeal exudate.  Eyes:     Pupils: Pupils are equal, round, and reactive to light.  Cardiovascular:      Rate and Rhythm: Normal rate and regular rhythm.  Pulmonary:     Effort: No respiratory distress.     Breath sounds: No wheezing.     Comments: Decreased air entry bilaterally.  No wheeze or crackles Abdominal:     General: Bowel sounds are normal. There is no distension.     Palpations: Abdomen is soft. There is no mass.     Tenderness: There is no abdominal tenderness. There is no guarding or rebound.  Musculoskeletal:        General: No tenderness. Normal range of motion.     Cervical back: Normal range of motion and neck supple.  Skin:    General: Skin is warm.  Neurological:     Mental Status: She is alert and oriented to person, place, and time.  Psychiatric:        Mood and Affect: Affect normal.     LABORATORY DATA:  I have reviewed the data as listed Lab Results  Component Value Date   WBC 6.0 12/12/2020   HGB 11.0 (L) 12/12/2020   HCT 34.6 (L) 12/12/2020   MCV 94.8 12/12/2020   PLT 106 (L) 12/12/2020   Recent Labs    08/04/20 1138 08/06/20 0457 12/06/20 0917 12/08/20 0959 12/12/20 0814  NA  --    < > 134* 135 136  K  --    < > 3.5 3.7 3.1*  CL  --    < > 104 104 104  CO2  --    < > '23 24 25  '$ GLUCOSE  --    < > 85 83 83  BUN  --    < > '13 12 21  '$ CREATININE  --    < > 0.43* 0.39* 0.53  CALCIUM  --    < > 8.6* 8.6* 9.1  GFRNONAA  --    < > >60 >60 >60  PROT  --    < > 7.2 7.5 7.2  ALBUMIN  --    < > 3.5 3.7 3.7  AST  --    < > '21 19 19  '$ ALT  --    < > '16 14 19  '$ ALKPHOS  --    < > 67 65 71  BILITOT  --    < > 0.9 1.6* 0.7  BILIDIR 3.7*  --   --   --   --    < > = values in this interval not displayed.    RADIOGRAPHIC STUDIES: I have personally reviewed the radiological images as listed and agreed with the findings in the report. DG Chest 2 View  Result Date: 12/08/2020 CLINICAL DATA:  Shortness of breath following chemotherapy 2 weeks ago. History of pancreatic cancer. EXAM: CHEST - 2 VIEW COMPARISON:  Chest x-ray 11/28/2020 FINDINGS: The right  IJ power port is stable. The cardiac silhouette, mediastinal and hilar contours are normal. Stable emphysematous changes with fairly marked hyperinflation but no acute overlying pulmonary process. No pulmonary lesions. No pleural effusions. No pneumothorax. The bony thorax is intact. IMPRESSION: Emphysematous changes but no acute pulmonary findings. Electronically Signed   By: Marijo Sanes M.D.   On: 12/08/2020 12:25   CT Angio Chest PE W and/or Wo Contrast  Result Date: 11/28/2020 CLINICAL DATA:  Pancreatic cancer, COPD, shortness of breath, tachycardia, evaluate for PE EXAM: CT ANGIOGRAPHY CHEST WITH CONTRAST TECHNIQUE: Multidetector CT imaging of the chest was performed using the standard protocol during bolus administration of intravenous contrast. Multiplanar CT image reconstructions and MIPs were obtained to evaluate the vascular anatomy. CONTRAST:  76m OMNIPAQUE IOHEXOL 350 MG/ML SOLN COMPARISON:  None. FINDINGS: Cardiovascular: Satisfactory opacification of the pulmonary arteries to the segmental level. No evidence of pulmonary embolism. Normal heart size. Three-vessel coronary artery calcifications. No pericardial effusion. Aortic atherosclerosis Mediastinum/Nodes: No enlarged mediastinal, hilar, or axillary lymph nodes. Small hiatal hernia. Thyroid gland, trachea, and esophagus demonstrate no significant findings. Lungs/Pleura: Severe centrilobular emphysema. Diffuse bilateral bronchial wall thickening. No pleural effusion or pneumothorax. Upper Abdomen: No acute abnormality. Partially imaged common bile duct stent and post stenting pneumobilia in the included upper abdomen. Musculoskeletal: No chest wall abnormality. No acute or  significant osseous findings. Review of the MIP images confirms the above findings. IMPRESSION: 1. Negative examination for pulmonary embolism. 2. Severe centrilobular emphysema with diffuse bilateral bronchial wall thickening, consistent with nonspecific infectious or  inflammatory bronchitis. 3. Coronary artery disease. 4. Partially imaged common bile duct stent and post stenting pneumobilia in the included upper abdomen, in keeping with patient history of pancreatic cancer. Aortic Atherosclerosis (ICD10-I70.0) and Emphysema (ICD10-J43.9). Electronically Signed   By: Eddie Candle M.D.   On: 11/28/2020 19:13   MR LIVER W WO CONTRAST  Result Date: 11/22/2020 CLINICAL DATA:  History of pancreatic cancer, liver lesions identified by prior CT, status post chemotherapy EXAM: MRI ABDOMEN WITHOUT AND WITH CONTRAST TECHNIQUE: Multiplanar multisequence MR imaging of the abdomen was performed both before and after the administration of intravenous contrast. CONTRAST:  55m GADAVIST GADOBUTROL 1 MMOL/ML IV SOLN COMPARISON:  CT abdomen pelvis, 11/15/2020, MR abdomen, 08/04/2020 FINDINGS: Lower chest: No acute findings. Hepatobiliary: Ill-defined hyperenhancing foci identified by prior CT correspond to transient, ill-defined foci of arterial hyperenhancement on MR without corresponding intrinsic signal abnormality, rim enhancement, or other characteristic features of metastases (series 14, image 47, 31). There are other subcentimeter foci of arterial hyperenhancement not appreciated by CT, for example in the peripheral liver dome, hepatic segment VII (series 14, image 17). At least two of these foci were present on prior MR examination dated 08/04/2020 and other arterially hyperenhancing lesions seen at that time are not present on current examination. There is an additional area of ill-defined, very subtle hyperenhancement of the inferior right lobe of the liver, hepatic segment VI (series 14, image 50). Common bile duct stent remains positioned with tip in the duodenum. Post stenting pneumobilia. 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 No inflammatory changes,  or other parenchymal abnormality identified. Mild, diffuse pancreatic ductal dilatation, measuring up to 0.6 cm. Spleen:  Within normal limits in size and appearance. Adrenals/Urinary Tract: No masses identified. No evidence of hydronephrosis. Stomach/Bowel: Visualized portions within the abdomen are unremarkable. Vascular/Lymphatic: Redemonstrated enlarged portacaval lymph nodes measuring up to 1.3 x 0.7 cm (series 14, image 46). Redemonstrated enlarged gastrohepatic ligament lymph node, measuring 0.9 x 0.6 cm (series 14, image 50). Redemonstrated infrarenal abdominal aortic aneurysm with a large burden of eccentric mural thrombus, measuring approximately 3.5 x 3.2 cm by MR (series 16, image 68). Other:  Trace ascites. Musculoskeletal: No suspicious bone lesions identified. IMPRESSION: 1. Ill-defined hyperenhancing foci identified by prior CT correspond to transient, ill-defined foci of arterial hyperenhancement without corresponding intrinsic signal abnormality, rim enhancement, or other characteristic features of metastases. There is an additional area of ill-defined, very subtle hyperenhancement of the inferior right lobe of the liver, hepatic segment VI. Some of these lesions were present on prior MR dated 08/04/2020, other lesions present at that time are no longer noted. Findings are most consistent with transient hepatic intensity differences, most likely related to parenchymal perfusion differences in the setting of portal vein occlusion. Metastatic disease is not favored, although attention is warranted on follow-up. 2. 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 3. Common bile duct stent remains in position. 4. Redemonstrated enlarged portacaval and gastrohepatic ligament lymph nodes, suspicious for nodal metastatic disease as seen on prior CT. 5. Redemonstrated infrarenal abdominal aortic aneurysm with a large  burden  of eccentric mural thrombus, measuring approximately 3.5 x 3.2 cm by MR. 6. Trace ascites. Aortic Atherosclerosis (ICD10-I70.0). Electronically Signed   By: Eddie Candle M.D.   On: 11/22/2020 07:47   DG Chest Portable 1 View  Result Date: 11/28/2020 CLINICAL DATA:  Shortness of breath history of pancreatic carcinoma EXAM: PORTABLE CHEST 1 VIEW COMPARISON:  None. FINDINGS: Cardiac shadow is within normal limits. Aortic calcifications are seen. Right chest wall port is noted in satisfactory position. Lungs are well aerated without focal infiltrate. No acute bony abnormality is seen. IMPRESSION: No acute abnormality noted. Electronically Signed   By: Inez Catalina M.D.   On: 11/28/2020 16:31   CT PANCREAS ABD W/WO  Result Date: 11/15/2020 CLINICAL DATA:  Pancreatic cancer.  Restaging. EXAM: CT ABDOMEN WITHOUT AND WITH CONTRAST TECHNIQUE: Multidetector CT imaging of the abdomen was performed following the standard protocol before and following the bolus administration of intravenous contrast. CONTRAST:  67m OMNIPAQUE IOHEXOL 350 MG/ML SOLN COMPARISON:  MRI 08/04/2020.  CT scan 08/04/2020. FINDINGS: Lower chest: Emphysema. Hepatobiliary: Hypervascular lesion identified in the peripheral anterior right liver (image 15/series 4) measuring about 8 mm. Similar hypervascular lesion noted inferior right liver on 51/4 measuring about 7 mm. Neither of these was visualized on the previous MRI. Hypervascular right hepatic lesion seen on the previous MRIs not evident by CT today. Subtle subcapsular area of focal mild arterial phase hyperenhancement identified inferior right liver on 56/4, not seen on prior studies. Finally a tiny 5 mm focus of arterial phase hyperenhancement noted inferior tip right liver on 71/4. Mild intra hepatic biliary duct dilatation is associated with pneumobilia. Common bile duct stent visualized in situ. Pancreas: Pancreatic head lesion measures 1.6 x 1.1 cm today which compares to 1.8 x 1.1  cm on previous CT when remeasured in a similar fashion. Diffuse pancreatic parenchymal atrophy noted with mild diffuse distention of the main pancreatic duct. Spleen: Spleen measures 10.7 x 10.6 x 7.8 cm (442 cc). Adrenals/Urinary Tract: No adrenal nodule or mass. Tiny hypodensity lower pole right kidney is too small to characterize but likely benign. Left kidney unremarkable. Stomach/Bowel: Stomach is nondistended Duodenum is normally positioned as is the ligament of Treitz. No small bowel or colonic dilatation within the visualized abdomen. Vascular/Lymphatic: Abdominal aortic aneurysm again noted measuring 3.5 x 3.4 cm. A collar of abnormal soft tissue abuts the celiac axis over a circumference of 180 degrees from 12:00 to 6:00 positions (see axial 43/series 18). Fat planes around the SMA appear preserved. Portal vein is markedly attenuated with only a string of patent lumen visible on coronal image 34 of series 14. The 11 mm portal caval node on 67/18 has increased from 5 mm previously. 7 mm short axis hepatoduodenal ligament lymph node on 56/18 was 4 mm previously. Small retroaortic lymph nodes are minimally bigger in the interval. Paraesophageal varices evident. Bilateral common iliac artery stents are again noted. Other: No intraperitoneal free fluid. Musculoskeletal: No worrisome lytic or sclerotic osseous abnormality. IMPRESSION: 1. No substantial interval change in the pancreatic head lesion with diffuse pancreatic parenchymal atrophy and mild diffuse distention of the main pancreatic duct. 2. Interval development of several tiny hypervascular lesions in the right liver, indeterminate but metastatic disease not excluded. 3. Progression of hepatoduodenal ligament and retroperitoneal lymphadenopathy, concerning for metastatic disease. 4. Abnormal soft tissue abuts the celiac axis over a circumference of 180 degrees from 12:00 to 6:00 positions. Imaging features relatively stable in the interval. 5. Fat  planes around the SMA appear  preserved. 6. Marked attenuation of the main portal vein with only a string of patent lumen visible. 7. Paraesophageal varices with borderline splenomegaly evident. 8. Abdominal aortic aneurysm measuring up to 3.5 cm, similar to prior. 9. Aortic Atherosclerosis (ICD10-I70.0). Electronically Signed   By: Misty Stanley M.D.   On: 11/15/2020 17:50     ASSESSMENT & PLAN:   Malignant neoplasm of head of pancreas [facility]) #Pancreatic adenocarcinoma pT1 however on EUS/CT [[facility]. Dr.Shah]-s/p FOLFIRINOX cycle #1/gemcitabine Abraxane [multiple interruptions because of poor tolerance]-August 16 shows no substantial improvement; however concern for development of several tiny hyperplastic lesions of liver-question metastatic disease.  New paraduodenal ligament/retroperitoneal adenopathy.   #Given continued poor tolerance to gemcitabine Abraxane-discontinue therapy.  #Patient status post evaluation with radiation oncology-proceed with Gem [600 mg/m2 weekly x 5-6 weeks with RT- 8/29-10/10].   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    # Chronic Diarrhea/adominal pain - on norco at [facility].Marland Kitchen   #Recurrent biliary obstruction/elevated LFTs-s/p repeat ERCP [AUG 11th, Dr.Wohl]- Improving.   #COPD/currently quit smoking not on home O2; on inhalers-STABLE.   # DISPOSITION:  # gem chemo today # 1 week-Tuesday or wed- lab- cbc/cmp;gem  # Follow up in 2 weeks- MD; labs- cbc/cmp;ca-19-9- gemcitabine chemo- Dr.B  # I reviewed the blood work- with the patient in detail; also reviewed the imaging independently [as summarized above]; and with the patient in detail.    All questions were answered. The patient knows to call the clinic with any problems, questions or concerns.    Cammie Sickle, MD 12/12/2020 11:47 PM

## 2020-11-28 NOTE — Progress Notes (Signed)
Nutrition  Called patient for phone visit.  Unable to reach patient. Left message with call back number.    Cam Harnden B. Zenia Resides, [place], [place] Registered Dietitian [phone] (mobile)

## 2020-11-29 ENCOUNTER — Ambulatory Visit: Payer: Medicare HMO

## 2020-11-29 DIAGNOSIS — J441 Chronic obstructive pulmonary disease with (acute) exacerbation: Secondary | ICD-10-CM | POA: Diagnosis not present

## 2020-11-29 DIAGNOSIS — R0602 Shortness of breath: Secondary | ICD-10-CM | POA: Diagnosis not present

## 2020-11-29 LAB — BASIC METABOLIC PANEL
Anion gap: 12 (ref 5–15)
BUN: 19 mg/dL (ref 8–23)
CO2: 22 mmol/L (ref 22–32)
Calcium: 8.8 mg/dL — ABNORMAL LOW (ref 8.9–10.3)
Chloride: 101 mmol/L (ref 98–111)
Creatinine, Ser: 0.73 mg/dL (ref 0.44–1.00)
GFR, Estimated: >60 mL/min (ref >60)
Glucose, Bld: 233 mg/dL — ABNORMAL HIGH (ref 70–99)
Potassium: 4 mmol/L (ref 3.5–5.1)
Sodium: 135 mmol/L (ref 135–145)

## 2020-11-29 LAB — CBC
HCT: 33.6 % — ABNORMAL LOW (ref 36.0–46.0)
Hemoglobin: 10.5 g/dL — ABNORMAL LOW (ref 12.0–15.0)
MCH: 29.7 pg (ref 26.0–34.0)
MCHC: 31.3 g/dL (ref 30.0–36.0)
MCV: 94.9 fL (ref 80.0–100.0)
Platelets: 216 10*3/uL (ref 150–400)
RBC: 3.54 MIL/uL — ABNORMAL LOW (ref 3.87–5.11)
RDW: 19.7 % — ABNORMAL HIGH (ref 11.5–15.5)
WBC: 4.2 10*3/uL (ref 4.0–10.5)
nRBC: 0 % (ref 0.0–0.2)

## 2020-11-29 LAB — RESP PANEL BY RT-PCR (FLU A&B, COVID) ARPGX2
Influenza A by PCR: NEGATIVE
Influenza B by PCR: NEGATIVE
SARS Coronavirus 2 by RT PCR: NEGATIVE

## 2020-11-29 LAB — CANCER ANTIGEN 19-9: CA 19-9: 29 U/mL (ref 0–35)

## 2020-11-29 LAB — TSH: TSH: 0.507 u[IU]/mL (ref 0.350–4.500)

## 2020-11-29 LAB — TROPONIN I (HIGH SENSITIVITY)
Troponin I (High Sensitivity): 387 ng/L (ref <18)
Troponin I (High Sensitivity): 359 ng/L (ref <18)

## 2020-11-29 LAB — HEPARIN LEVEL (UNFRACTIONATED): Heparin Unfractionated: <0.1 IU/mL — ABNORMAL LOW (ref 0.30–0.70)

## 2020-11-29 LAB — PROTIME-INR
INR: 1 (ref 0.8–1.2)
Prothrombin Time: 13.6 seconds (ref 11.4–15.2)

## 2020-11-29 LAB — APTT: aPTT: 55 seconds — ABNORMAL HIGH (ref 24–36)

## 2020-11-29 MED ORDER — ENSURE ENLIVE PO LIQD: 237.0000 mL | Freq: Three times a day (TID) | ORAL | Status: DC

## 2020-11-29 MED ORDER — CALCIUM CARBONATE ANTACID 500 MG PO CHEW
1.0000 | CHEWABLE_TABLET | ORAL | Status: DC | PRN
  Administered 2020-11-29: 200 mg via ORAL
  Filled 2020-11-29: qty 1

## 2020-11-29 MED ORDER — ALBUTEROL SULFATE HFA 108 (90 BASE) MCG/ACT IN AERS: 2.0000 | INHALATION_SPRAY | Freq: Four times a day (QID) | RESPIRATORY_TRACT | 2 refills | Status: DC | PRN

## 2020-11-29 MED ORDER — FAMOTIDINE 20 MG PO TABS
20.0000 mg | ORAL_TABLET | Freq: Two times a day (BID) | ORAL | Status: DC
  Administered 2020-11-29: 20 mg via ORAL
  Filled 2020-11-29: qty 1

## 2020-11-29 MED ORDER — IPRATROPIUM-ALBUTEROL 0.5-2.5 (3) MG/3ML IN SOLN: 3.0000 mL | RESPIRATORY_TRACT | 0 refills | Status: DC | PRN

## 2020-11-29 MED ORDER — HEPARIN BOLUS VIA INFUSION
1300.0000 [IU] | Freq: Once | INTRAVENOUS | Status: AC
  Administered 2020-11-29: 1300 [IU] via INTRAVENOUS
  Filled 2020-11-29: qty 1300

## 2020-11-29 MED ORDER — PREDNISONE 20 MG PO TABS: 40.0000 mg | ORAL_TABLET | Freq: Every day | ORAL | 0 refills | Status: AC

## 2020-11-29 NOTE — Consult Note (Signed)
ANTICOAGULATION CONSULT NOTE - Initial Consult  Pharmacy Consult for heparin dosing Indication: chest pain/ACS  Allergies  Allergen Reactions   Tramadol Other (See Comments)    unknown   Penicillins Rash    Pt states that she has been taking this medication and has had not problem    Patient Measurements:   Heparin Dosing Weight: 43.2  Vital Signs: Temp: 98.3 F (36.8 C) (08/30 0230) Temp Source: Oral (08/30 0230) BP: 113/73 (08/30 0300) Pulse Rate: 95 (08/30 0300)  Labs: Recent Labs    11/28/20 1042 11/28/20 1817 11/28/20 2009 11/29/20 0038 11/29/20 0150  HGB 11.4*  --  10.8*  --   --   HCT 36.2  --  33.6*  --   --   PLT 261  --  217  --   --   APTT  --   --   --   --  55*  LABPROT  --   --   --   --  13.6  INR  --   --   --   --  1.0  HEPARINUNFRC  --   --   --   --  <0.10*  CREATININE 0.46 0.71  --   --   --   TROPONINIHS  --  293*  --  387* 359*    CrCl: 45.33m/min; Scr (actual) 0.71; (dosing) 0.8  Medical History: Past Medical History:  Diagnosis Date   Abdominal pain, generalized    Anxiety    Atypical chest pain    Back pain    Basal cell carcinoma of skin 2013   resected from Left scalp area.    COPD (chronic obstructive pulmonary disease) (HCC)    Depression    DOE (dyspnea on exertion)    GERD (gastroesophageal reflux disease)    Hypertension    Pancreatic cancer (HCC)    Shortness of breath dyspnea     Medications: (outpt anticoagulant regimen) None On Chemo-hypercoaguable state  Assessment: 70y.o. femalewho presents to the ED for evaluation of SOB post radiation and chemo infusion session. Pharmacy has been consulted for heparin dosing.  Goal of Therapy:  Heparin level 0.3-0.7 units/ml Monitor plt's, CBC and heparin level per protocol   Plan:  8/30:  HL @ 0150 = < 0.1 Will order Heparin 1300 units IV X 1 bolus and increase drip rate to 650 units/hr.  Will recheck HL 6 hrs after rate change.   Candiace West D 11/29/2020,3:18  AM

## 2020-11-29 NOTE — ED Notes (Signed)
Phlebotomy @ the bedside. 

## 2020-11-29 NOTE — ED Notes (Signed)
Pt assisted to the restroom by this RN. Pt tolerated well - denies SOB or CP.

## 2020-11-29 NOTE — Discharge Summary (Signed)
Physician Discharge Summary  Shirley Barton [id number] DOB: 04/08/1950 DOA: 11/28/2020  PCP: Erma Heritage, MD  Admit date: 11/28/2020 Discharge date: 11/29/2020  Admitted From: Home Disposition: Home  Recommendations for Outpatient Follow-up:  Follow up with PCP in 1-2 weeks   Home Health: No Equipment/Devices: None  Discharge Condition: Stable CODE STATUS: Full Diet recommendation: Regular  Brief/Interim Summary: 70 y.o. 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   She reports that on day of presentation she completed her radiation treatment as normal and saw her medical oncology provider and was at her normal state of health.   She went to her radiation appointment at 9:45 AM, completed radiation, then had her chemotherapy infusion and completed chemotherapy infusion at approximately 1:35 PM.   Her son took her to [employer] to get some food, they got chicken wings. She ate 3 chicken wings and then she developed shortness of breath. Normally when she has shortness of breath, she will improve with laying down or using her inhaler.    She tried to Advair this time and it did not improve. She states she could not breath in (nose and mouth) and she could not breath out (in and out).  She was so short of breath that she had difficulty moving and did not attempt to lay down.  She endorses feeling tightness as she could not take deep inhalation.  She states she has never experienced this before.   Initial concern for NSTEMI the patient was started on a heparin GTT.  Troponin peaked at 300 then down trended.  Low suspicion for ACS.  Heparin GTT stopped.  Patient responded  really well to second DuoNeb treatment.  At time of discharge patient is on room air.  Shortness of breath improved.  Lung sounds clear.  Will discharge home and resume home Advair.  Nebulizer and medications provided.  Also provided albuterol MDI rescue inhaler.   Discharge Diagnoses:  Principal Problem:   Shortness of breath Active Problems:   PAD (peripheral artery disease) (HCC)   Atherosclerosis of native artery of both lower extremities with intermittent claudication (HCC)   Atypical chest pain   Chronic obstructive pulmonary disease (HCC)   Essential (primary) hypertension   HLD (hyperlipidemia)   GERD (gastroesophageal reflux disease)   Depression   Pancreatic mass   Bilirubinemia   Malignant neoplasm of head of pancreas (HCC)  Shortness of breath Mild exacerbation of COPD Mild hypoxic respiratory failure Unclear trigger.  Patient required 2 L of oxygen.  This was weaned off at time of discharge.  At time of discharge will recommend prednisone 40 mg a day x5 days, provided nebulizer machine and duo nebs.  Albuterol MDI as needed.  Resume home Advair.  Follow-up outpatient PCP.  Discharge Instructions  Discharge Instructions     Diet - low sodium heart healthy   Complete by: As directed    For home use only DME Nebulizer machine   Complete by: As directed    Patient needs a nebulizer to treat with the following condition: COPD (chronic obstructive pulmonary disease) ([place])   Length of Need: Lifetime   Increase activity slowly   Complete by: As directed       Allergies as of 11/29/2020  Reactions   Tramadol Other (See Comments)   unknown   Penicillins Rash   Pt states that she has been taking this medication and has had not problem        Medication List     TAKE these medications    acetaminophen 325 MG tablet Commonly known as: TYLENOL Take 650 mg by mouth every 6 (six) hours as needed for mild pain or moderate pain.   albuterol 108 (90 Base)  MCG/ACT inhaler Commonly known as: VENTOLIN HFA Inhale 2 puffs into the lungs every 6 (six) hours as needed for wheezing or shortness of breath.   B complex-vitamin C-folic acid 1 MG tablet Take 1 tablet by mouth daily with breakfast.   diphenoxylate-atropine 2.5-0.025 MG tablet Commonly known as: LOMOTIL Take 1 tablet by mouth 4 (four) times daily as needed for diarrhea or loose stools.   Fluticasone-Salmeterol 100-50 MCG/DOSE Aepb Commonly known as: ADVAIR Inhale 1 puff into the lungs 2 (two) times daily.   HYDROcodone-acetaminophen 5-325 MG tablet Commonly known as: Norco Take 1 tablet by mouth every 6 (six) hours as needed for moderate pain.   ipratropium-albuterol 0.5-2.5 (3) MG/3ML Soln Commonly known as: DUONEB Take 3 mLs by nebulization every 4 (four) hours as needed.   Klor-Con M20 20 MEQ tablet Generic drug: potassium chloride SA TAKE 1 TABLET BY MOUTH TWICE A DAY   lidocaine-prilocaine cream Commonly known as: EMLA Apply 30 -45 mins prior to port access.   magic mouthwash w/lidocaine Soln Take 10 mLs by mouth every 2 (two) hours as needed for mouth pain.   megestrol 625 MG/5ML suspension Commonly known as: MEGACE ES Take 5 mLs (625 mg total) by mouth daily.   ondansetron 8 MG tablet Commonly known as: ZOFRAN One pill every 8 hours as needed for nausea/vomitting.   pantoprazole 40 MG tablet Commonly known as: PROTONIX Take 1 tablet (40 mg total) by mouth daily.   predniSONE 20 MG tablet Commonly known as: DELTASONE Take 2 tablets (40 mg total) by mouth daily with breakfast for 5 days. Once a day with food. What changed: how much to take   prochlorperazine 10 MG tablet Commonly known as: COMPAZINE Take 1 tablet (10 mg total) by mouth every 6 (six) hours as needed for nausea or vomiting.               Durable Medical Equipment  (From admission, onward)           Start     Ordered   11/29/20 1122  For home use only DME Nebulizer machine   Once       Question Answer Comment  Patient needs a nebulizer to treat with the following condition COPD exacerbation ([place])   Length of Need Lifetime      11/29/20 1121   11/29/20 0000  For home use only DME Nebulizer machine       Question Answer Comment  Patient needs a nebulizer to treat with the following condition COPD (chronic obstructive pulmonary disease) ([place])   Length of Need Lifetime      11/29/20 0802            Allergies  Allergen Reactions   Tramadol Other (See Comments)    unknown   Penicillins Rash    Pt states that she has been taking this medication and has had not problem    Consultations: None   Procedures/Studies: CT Angio Chest PE W and/or Wo Contrast  Result Date: 11/28/2020 CLINICAL DATA:  Pancreatic cancer, COPD, shortness of breath, tachycardia, evaluate for PE EXAM: CT ANGIOGRAPHY CHEST WITH CONTRAST TECHNIQUE: Multidetector CT imaging of the chest was performed using the standard protocol during bolus administration of intravenous contrast. Multiplanar CT image reconstructions and MIPs were obtained to evaluate the vascular anatomy. CONTRAST:  75m OMNIPAQUE IOHEXOL 350 MG/ML SOLN COMPARISON:  None. FINDINGS: Cardiovascular: Satisfactory opacification of the pulmonary arteries to the segmental level. No evidence of pulmonary embolism. Normal heart size. Three-vessel coronary artery calcifications. No pericardial effusion. Aortic atherosclerosis Mediastinum/Nodes: No enlarged mediastinal, hilar, or axillary lymph nodes. Small hiatal hernia. Thyroid gland, trachea, and esophagus demonstrate no significant findings. Lungs/Pleura: Severe centrilobular emphysema. Diffuse bilateral bronchial wall thickening. No pleural effusion or pneumothorax. Upper Abdomen: No acute abnormality. Partially imaged common bile duct stent and post stenting pneumobilia in the included upper abdomen. Musculoskeletal: No chest wall abnormality. No acute or significant osseous  findings. Review of the MIP images confirms the above findings. IMPRESSION: 1. Negative examination for pulmonary embolism. 2. Severe centrilobular emphysema with diffuse bilateral bronchial wall thickening, consistent with nonspecific infectious or inflammatory bronchitis. 3. Coronary artery disease. 4. Partially imaged common bile duct stent and post stenting pneumobilia in the included upper abdomen, in keeping with patient history of pancreatic cancer. Aortic Atherosclerosis (ICD10-I70.0) and Emphysema (ICD10-J43.9). Electronically Signed   By: AEddie CandleM.D.   On: 11/28/2020 19:13   MR LIVER W WO CONTRAST  Result Date: 11/22/2020 CLINICAL DATA:  History of pancreatic cancer, liver lesions identified by prior CT, status post chemotherapy EXAM: MRI ABDOMEN WITHOUT AND WITH CONTRAST TECHNIQUE: Multiplanar multisequence MR imaging of the abdomen was performed both before and after the administration of intravenous contrast. CONTRAST:  442mGADAVIST GADOBUTROL 1 MMOL/ML IV SOLN COMPARISON:  CT abdomen pelvis, 11/15/2020, MR abdomen, 08/04/2020 FINDINGS: Lower chest: No acute findings. Hepatobiliary: Ill-defined hyperenhancing foci identified by prior CT correspond to transient, ill-defined foci of arterial hyperenhancement on MR without corresponding intrinsic signal abnormality, rim enhancement, or other characteristic features of metastases (series 14, image 47, 31). There are other subcentimeter foci of arterial hyperenhancement not appreciated by CT, for example in the peripheral liver dome, hepatic segment VII (series 14, image 17). At least two of these foci were present on prior MR examination dated 08/04/2020 and other arterially hyperenhancing lesions seen at that time are not present on current examination. There is an additional area of ill-defined, very subtle hyperenhancement of the inferior right lobe of the liver, hepatic segment VI (series 14, image 50). Common bile duct stent remains  positioned with tip in the duodenum. Post stenting pneumobilia. 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 No inflammatory changes, or other parenchymal abnormality identified. Mild, diffuse pancreatic ductal dilatation, measuring up to 0.6 cm. Spleen:  Within normal limits in size and appearance. Adrenals/Urinary Tract: No masses identified. No evidence of hydronephrosis. Stomach/Bowel: Visualized portions within the abdomen are unremarkable. Vascular/Lymphatic: Redemonstrated enlarged portacaval lymph nodes measuring up to 1.3 x 0.7 cm (series 14, image 46). Redemonstrated enlarged gastrohepatic ligament lymph node, measuring 0.9 x 0.6 cm (series 14, image 50). Redemonstrated infrarenal abdominal aortic aneurysm with a large burden of eccentric mural thrombus, measuring approximately 3.5 x 3.2 cm by MR (series 16, image 68). Other:  Trace ascites. Musculoskeletal: No suspicious bone lesions identified. IMPRESSION: 1. Ill-defined hyperenhancing foci identified by prior CT correspond to transient, ill-defined foci of arterial hyperenhancement  without corresponding intrinsic signal abnormality, rim enhancement, or other characteristic features of metastases. There is an additional area of ill-defined, very subtle hyperenhancement of the inferior right lobe of the liver, hepatic segment VI. Some of these lesions were present on prior MR dated 08/04/2020, other lesions present at that time are no longer noted. Findings are most consistent with transient hepatic intensity differences, most likely related to parenchymal perfusion differences in the setting of portal vein occlusion. Metastatic disease is not favored, although attention is warranted on follow-up. 2. Known pancreatic head mass is poorly appreciated by MR; primary evidence of mass is occlusion of the central portion of the portal  vein. 3. Common bile duct stent remains in position. 4. Redemonstrated enlarged portacaval and gastrohepatic ligament lymph nodes, suspicious for nodal metastatic disease as seen on prior CT. 5. Redemonstrated infrarenal abdominal aortic aneurysm with a large burden of eccentric mural thrombus, measuring approximately 3.5 x 3.2 cm by MR. 6. Trace ascites. Aortic Atherosclerosis (ICD10-I70.0). Electronically Signed   By: Eddie Candle M.D.   On: 11/22/2020 07:47   DG Chest Portable 1 View  Result Date: 11/28/2020 CLINICAL DATA:  Shortness of breath history of pancreatic carcinoma EXAM: PORTABLE CHEST 1 VIEW COMPARISON:  None. FINDINGS: Cardiac shadow is within normal limits. Aortic calcifications are seen. Right chest wall port is noted in satisfactory position. Lungs are well aerated without focal infiltrate. No acute bony abnormality is seen. IMPRESSION: No acute abnormality noted. Electronically Signed   By: Inez Catalina M.D.   On: 11/28/2020 16:31   DG C-Arm 1-60 Min-No Report  Result Date: 11/10/2020 Fluoroscopy was utilized by the requesting physician.  No radiographic interpretation.   CT PANCREAS ABD W/WO  Result Date: 11/15/2020 CLINICAL DATA:  Pancreatic cancer.  Restaging. EXAM: CT ABDOMEN WITHOUT AND WITH CONTRAST TECHNIQUE: Multidetector CT imaging of the abdomen was performed following the standard protocol before and following the bolus administration of intravenous contrast. CONTRAST:  46m OMNIPAQUE IOHEXOL 350 MG/ML SOLN COMPARISON:  MRI 08/04/2020.  CT scan 08/04/2020. FINDINGS: Lower chest: Emphysema. Hepatobiliary: Hypervascular lesion identified in the peripheral anterior right liver (image 15/series 4) measuring about 8 mm. Similar hypervascular lesion noted inferior right liver on 51/4 measuring about 7 mm. Neither of these was visualized on the previous MRI. Hypervascular right hepatic lesion seen on the previous MRIs not evident by CT today. Subtle subcapsular area of focal mild  arterial phase hyperenhancement identified inferior right liver on 56/4, not seen on prior studies. Finally a tiny 5 mm focus of arterial phase hyperenhancement noted inferior tip right liver on 71/4. Mild intra hepatic biliary duct dilatation is associated with pneumobilia. Common bile duct stent visualized in situ. Pancreas: Pancreatic head lesion measures 1.6 x 1.1 cm today which compares to 1.8 x 1.1 cm on previous CT when remeasured in a similar fashion. Diffuse pancreatic parenchymal atrophy noted with mild diffuse distention of the main pancreatic duct. Spleen: Spleen measures 10.7 x 10.6 x 7.8 cm (442 cc). Adrenals/Urinary Tract: No adrenal nodule or mass. Tiny hypodensity lower pole right kidney is too small to characterize but likely benign. Left kidney unremarkable. Stomach/Bowel: Stomach is nondistended Duodenum is normally positioned as is the ligament of Treitz. No small bowel or colonic dilatation within the visualized abdomen. Vascular/Lymphatic: Abdominal aortic aneurysm again noted measuring 3.5 x 3.4 cm. A collar of abnormal soft tissue abuts the celiac axis over a circumference of 180 degrees from 12:00 to 6:00 positions (see axial 43/series 18). Fat planes around the SMA  appear preserved. Portal vein is markedly attenuated with only a string of patent lumen visible on coronal image 34 of series 14. The 11 mm portal caval node on 67/18 has increased from 5 mm previously. 7 mm short axis hepatoduodenal ligament lymph node on 56/18 was 4 mm previously. Small retroaortic lymph nodes are minimally bigger in the interval. Paraesophageal varices evident. Bilateral common iliac artery stents are again noted. Other: No intraperitoneal free fluid. Musculoskeletal: No worrisome lytic or sclerotic osseous abnormality. IMPRESSION: 1. No substantial interval change in the pancreatic head lesion with diffuse pancreatic parenchymal atrophy and mild diffuse distention of the main pancreatic duct. 2. Interval  development of several tiny hypervascular lesions in the right liver, indeterminate but metastatic disease not excluded. 3. Progression of hepatoduodenal ligament and retroperitoneal lymphadenopathy, concerning for metastatic disease. 4. Abnormal soft tissue abuts the celiac axis over a circumference of 180 degrees from 12:00 to 6:00 positions. Imaging features relatively stable in the interval. 5. Fat planes around the SMA appear preserved. 6. Marked attenuation of the main portal vein with only a string of patent lumen visible. 7. Paraesophageal varices with borderline splenomegaly evident. 8. Abdominal aortic aneurysm measuring up to 3.5 cm, similar to prior. 9. Aortic Atherosclerosis (ICD10-I70.0). Electronically Signed   By: Misty Stanley M.D.   On: 11/15/2020 17:50   (Echo, Carotid, EGD, Colonoscopy, ERCP)    Subjective: Seen and examined at the time of discharge.  Respiratory status back to baseline.  Stable for discharge home.  Discharge Exam: Vitals:   11/29/20 1230 11/29/20 1300  BP: 129/80 123/85  Pulse: 93 96  Resp: 19 (!) 21  Temp:    SpO2: 98% 96%   Vitals:   11/29/20 1100 11/29/20 1200 11/29/20 1230 11/29/20 1300  BP: 121/77 127/89 129/80 123/85  Pulse: (!) 59 (!) 102 93 96  Resp: 20 (!) 25 19 (!) 21  Temp:      TempSrc:      SpO2: 98% 99% 98% 96%    General: Pt is alert, awake, not in acute distress Cardiovascular: RRR, S1/S2 +, no rubs, no gallops Respiratory: CTA bilaterally, no wheezing, no rhonchi Abdominal: Soft, NT, ND, bowel sounds + Extremities: no edema, no cyanosis    The results of significant diagnostics from this hospitalization (including imaging, microbiology, ancillary and laboratory) are listed below for reference.     Microbiology: Recent Results (from the past 240 hour(s))  Resp Panel by RT-PCR (Flu A&B, Covid) Nasopharyngeal Swab     Status: None   Collection Time: 11/29/20  2:07 AM   Specimen: Nasopharyngeal Swab; Nasopharyngeal(NP) swabs  in vial transport medium  Result Value Ref Range Status   SARS Coronavirus 2 by RT PCR NEGATIVE NEGATIVE Final    Comment: (NOTE) SARS-CoV-2 target nucleic acids are NOT DETECTED.  The SARS-CoV-2 RNA is generally detectable in upper respiratory specimens during the acute phase of infection. The lowest concentration of SARS-CoV-2 viral copies this assay can detect is 138 copies/mL. A negative result does not preclude SARS-Cov-2 infection and should not be used as the sole basis for treatment or other patient management decisions. A negative result may occur with  improper specimen collection/handling, submission of specimen other than nasopharyngeal swab, presence of viral mutation(s) within the areas targeted by this assay, and inadequate number of viral copies(<138 copies/mL). A negative result must be combined with clinical observations, patient history, and epidemiological information. The expected result is Negative.  Fact Sheet for Patients:  EntrepreneurPulse.com.au  Fact Sheet for Healthcare  Providers:  IncredibleEmployment.be  This test is no t yet approved or cleared by the [place] and  has been authorized for detection and/or diagnosis of SARS-CoV-2 by FDA under an Emergency Use Authorization (EUA). This EUA will remain  in effect (meaning this test can be used) for the duration of the COVID-19 declaration under Section 564(b)(1) of the Act, 21 U.S.C.section 360bbb-3(b)(1), unless the authorization is terminated  or revoked sooner.       Influenza A by PCR NEGATIVE NEGATIVE Final   Influenza B by PCR NEGATIVE NEGATIVE Final    Comment: (NOTE) The Xpert Xpress SARS-CoV-2/FLU/RSV plus assay is intended as an aid in the diagnosis of influenza from Nasopharyngeal swab specimens and should not be used as a sole basis for treatment. Nasal washings and aspirates are unacceptable for Xpert Xpress  SARS-CoV-2/FLU/RSV testing.  Fact Sheet for Patients: EntrepreneurPulse.com.au  Fact Sheet for Healthcare Providers: IncredibleEmployment.be  This test is not yet approved or cleared by the [place] FDA and has been authorized for detection and/or diagnosis of SARS-CoV-2 by FDA under an Emergency Use Authorization (EUA). This EUA will remain in effect (meaning this test can be used) for the duration of the COVID-19 declaration under Section 564(b)(1) of the Act, 21 U.S.C. section 360bbb-3(b)(1), unless the authorization is terminated or revoked.  Performed at [facility], [address] [place], [place] [address]      Labs: BNP (last 3 results) Recent Labs    11/28/20 2009  BNP 0000000   Basic Metabolic Panel: Recent Labs  Lab 11/28/20 1042 11/28/20 1817 11/29/20 0616  NA 136 135 135  K 3.9 4.0 4.0  CL 106 104 101  CO2 '24 22 22  '$ GLUCOSE 83 130* 233*  BUN '16 17 19  '$ CREATININE 0.46 0.71 0.73  CALCIUM 9.0 9.0 8.8*  MG  --  1.8  --    Liver Function Tests: Recent Labs  Lab 11/28/20 1042 11/28/20 1817  AST 20 21  ALT 15 17  ALKPHOS 85 83  BILITOT 0.9 1.3*  PROT 7.9 7.5  ALBUMIN 3.6 3.4*   Recent Labs  Lab 11/28/20 1817  LIPASE 25   No results for input(s): AMMONIA in the last 168 hours. CBC: Recent Labs  Lab 11/28/20 1042 11/28/20 2009 11/29/20 0616  WBC 9.9 10.5 4.2  NEUTROABS 5.5  --   --   HGB 11.4* 10.8* 10.5*  HCT 36.2 33.6* 33.6*  MCV 94.3 95.5 94.9  PLT 261 217 216   Cardiac Enzymes: No results for input(s): CKTOTAL, CKMB, CKMBINDEX, TROPONINI in the last 168 hours. BNP: Invalid input(s): POCBNP CBG: No results for input(s): GLUCAP in the last 168 hours. D-Dimer No results for input(s): DDIMER in the last 72 hours. Hgb A1c No results for input(s): HGBA1C in the last 72 hours. Lipid Profile No results for input(s): CHOL, HDL, LDLCALC, TRIG, CHOLHDL, LDLDIRECT in the last 72  hours. Thyroid function studies Recent Labs    11/29/20 0038  TSH 0.507   Anemia work up No results for input(s): VITAMINB12, FOLATE, FERRITIN, TIBC, IRON, RETICCTPCT in the last 72 hours. Urinalysis    Component Value Date/Time   COLORURINE AMBER (A) 08/04/2020 1138   APPEARANCEUR CLEAR (A) 08/04/2020 1138   APPEARANCEUR Clear 10/06/2015 1406   LABSPEC >1.046 (H) 08/04/2020 1138   PHURINE 5.0 08/04/2020 1138   GLUCOSEU NEGATIVE 08/04/2020 1138   HGBUR SMALL (A) 08/04/2020 1138   BILIRUBINUR MODERATE (A) 08/04/2020 1138   BILIRUBINUR Negative 10/06/2015 1406  KETONESUR 5 (A) 08/04/2020 1138   PROTEINUR NEGATIVE 08/04/2020 1138   UROBILINOGEN 0.2 04/10/2012 0946   NITRITE NEGATIVE 08/04/2020 [place] 08/04/2020 1138   Sepsis Labs Invalid input(s): PROCALCITONIN,  WBC,  LACTICIDVEN Microbiology Recent Results (from the past 240 hour(s))  Resp Panel by RT-PCR (Flu A&B, Covid) Nasopharyngeal Swab     Status: None   Collection Time: 11/29/20  2:07 AM   Specimen: Nasopharyngeal Swab; Nasopharyngeal(NP) swabs in vial transport medium  Result Value Ref Range Status   SARS Coronavirus 2 by RT PCR NEGATIVE NEGATIVE Final    Comment: (NOTE) SARS-CoV-2 target nucleic acids are NOT DETECTED.  The SARS-CoV-2 RNA is generally detectable in upper respiratory specimens during the acute phase of infection. The lowest concentration of SARS-CoV-2 viral copies this assay can detect is 138 copies/mL. A negative result does not preclude SARS-Cov-2 infection and should not be used as the sole basis for treatment or other patient management decisions. A negative result may occur with  improper specimen collection/handling, submission of specimen other than nasopharyngeal swab, presence of viral mutation(s) within the areas targeted by this assay, and inadequate number of viral copies(<138 copies/mL). A negative result must be combined with clinical observations, patient  history, and epidemiological information. The expected result is Negative.  Fact Sheet for Patients:  EntrepreneurPulse.com.au  Fact Sheet for Healthcare Providers:  IncredibleEmployment.be  This test is no t yet approved or cleared by the [place] FDA and  has been authorized for detection and/or diagnosis of SARS-CoV-2 by FDA under an Emergency Use Authorization (EUA). This EUA will remain  in effect (meaning this test can be used) for the duration of the COVID-19 declaration under Section 564(b)(1) of the Act, 21 U.S.C.section 360bbb-3(b)(1), unless the authorization is terminated  or revoked sooner.       Influenza A by PCR NEGATIVE NEGATIVE Final   Influenza B by PCR NEGATIVE NEGATIVE Final    Comment: (NOTE) The Xpert Xpress SARS-CoV-2/FLU/RSV plus assay is intended as an aid in the diagnosis of influenza from Nasopharyngeal swab specimens and should not be used as a sole basis for treatment. Nasal washings and aspirates are unacceptable for Xpert Xpress SARS-CoV-2/FLU/RSV testing.  Fact Sheet for Patients: EntrepreneurPulse.com.au  Fact Sheet for Healthcare Providers: IncredibleEmployment.be  This test is not yet approved or cleared by the [place] FDA and has been authorized for detection and/or diagnosis of SARS-CoV-2 by FDA under an Emergency Use Authorization (EUA). This EUA will remain in effect (meaning this test can be used) for the duration of the COVID-19 declaration under Section 564(b)(1) of the Act, 21 U.S.C. section 360bbb-3(b)(1), unless the authorization is terminated or revoked.  Performed at [facility], [address], [place], [place] [address]      Time coordinating discharge: Over 30 minutes  SIGNED:   Sidney Ace, MD  [facility] 11/29/2020, 3:11 PM Pager   If 7PM-7AM, please contact night-coverage

## 2020-11-30 ENCOUNTER — Ambulatory Visit
Admission: RE | Admit: 2020-11-30 | Discharge: 2020-11-30 | Disposition: A | Discharge status: Home / Self Care | Payer: Medicare HMO | Source: Ambulatory Visit | Attending: Radiation Oncology | Admitting: Radiation Oncology

## 2020-11-30 ENCOUNTER — Encounter: Payer: Self-pay | Admitting: Internal Medicine

## 2020-11-30 DIAGNOSIS — Z510 Encounter for antineoplastic radiation therapy: Secondary | ICD-10-CM | POA: Diagnosis not present

## 2020-12-01 ENCOUNTER — Ambulatory Visit
Admission: RE | Admit: 2020-12-01 | Discharge: 2020-12-01 | Disposition: A | Discharge status: Home / Self Care | Payer: Medicare HMO | Source: Ambulatory Visit | Attending: Radiation Oncology | Admitting: Radiation Oncology

## 2020-12-01 DIAGNOSIS — C250 Malignant neoplasm of head of pancreas: Secondary | ICD-10-CM | POA: Insufficient documentation

## 2020-12-01 DIAGNOSIS — Z510 Encounter for antineoplastic radiation therapy: Secondary | ICD-10-CM | POA: Insufficient documentation

## 2020-12-02 ENCOUNTER — Ambulatory Visit
Admission: RE | Admit: 2020-12-02 | Discharge: 2020-12-02 | Disposition: A | Discharge status: Home / Self Care | Payer: Medicare HMO | Source: Ambulatory Visit | Attending: Radiation Oncology | Admitting: Radiation Oncology

## 2020-12-02 DIAGNOSIS — Z510 Encounter for antineoplastic radiation therapy: Secondary | ICD-10-CM | POA: Diagnosis not present

## 2020-12-06 ENCOUNTER — Inpatient Hospital Stay: Payer: Medicare HMO

## 2020-12-06 ENCOUNTER — Ambulatory Visit
Admission: RE | Admit: 2020-12-06 | Discharge: 2020-12-06 | Disposition: A | Discharge status: Home / Self Care | Payer: Medicare HMO | Source: Ambulatory Visit | Attending: Radiation Oncology | Admitting: Radiation Oncology

## 2020-12-06 ENCOUNTER — Inpatient Hospital Stay: Payer: Medicare HMO | Attending: Internal Medicine

## 2020-12-06 VITALS — BP 129/81 | HR 102 | Temp 95.9°F | Resp 20 | Wt 95.1 lb

## 2020-12-06 DIAGNOSIS — Z510 Encounter for antineoplastic radiation therapy: Secondary | ICD-10-CM | POA: Diagnosis not present

## 2020-12-06 DIAGNOSIS — Z5111 Encounter for antineoplastic chemotherapy: Secondary | ICD-10-CM | POA: Insufficient documentation

## 2020-12-06 DIAGNOSIS — R7989 Other specified abnormal findings of blood chemistry: Secondary | ICD-10-CM | POA: Diagnosis not present

## 2020-12-06 DIAGNOSIS — K831 Obstruction of bile duct: Secondary | ICD-10-CM | POA: Insufficient documentation

## 2020-12-06 DIAGNOSIS — C250 Malignant neoplasm of head of pancreas: Secondary | ICD-10-CM | POA: Diagnosis present

## 2020-12-06 DIAGNOSIS — R002 Palpitations: Secondary | ICD-10-CM | POA: Insufficient documentation

## 2020-12-06 DIAGNOSIS — R634 Abnormal weight loss: Secondary | ICD-10-CM | POA: Diagnosis not present

## 2020-12-06 DIAGNOSIS — Z87891 Personal history of nicotine dependence: Secondary | ICD-10-CM | POA: Diagnosis not present

## 2020-12-06 DIAGNOSIS — R197 Diarrhea, unspecified: Secondary | ICD-10-CM | POA: Diagnosis not present

## 2020-12-06 DIAGNOSIS — I10 Essential (primary) hypertension: Secondary | ICD-10-CM | POA: Diagnosis not present

## 2020-12-06 DIAGNOSIS — R000 Tachycardia, unspecified: Secondary | ICD-10-CM | POA: Diagnosis not present

## 2020-12-06 DIAGNOSIS — R590 Localized enlarged lymph nodes: Secondary | ICD-10-CM | POA: Diagnosis not present

## 2020-12-06 DIAGNOSIS — R0602 Shortness of breath: Secondary | ICD-10-CM | POA: Diagnosis not present

## 2020-12-06 LAB — CBC WITH DIFFERENTIAL/PLATELET
Abs Immature Granulocytes: 0.03 10*3/uL (ref 0.00–0.07)
Basophils Absolute: 0 10*3/uL (ref 0.0–0.1)
Basophils Relative: 0 %
Eosinophils Absolute: 0.1 10*3/uL (ref 0.0–0.5)
Eosinophils Relative: 1 %
HCT: 35.1 % — ABNORMAL LOW (ref 36.0–46.0)
Hemoglobin: 11.4 g/dL — ABNORMAL LOW (ref 12.0–15.0)
Immature Granulocytes: 0 %
Lymphocytes Relative: 23 %
Lymphs Abs: 1.6 10*3/uL (ref 0.7–4.0)
MCH: 30.6 pg (ref 26.0–34.0)
MCHC: 32.5 g/dL (ref 30.0–36.0)
MCV: 94.4 fL (ref 80.0–100.0)
Monocytes Absolute: 0.8 10*3/uL (ref 0.1–1.0)
Monocytes Relative: 12 %
Neutro Abs: 4.3 10*3/uL (ref 1.7–7.7)
Neutrophils Relative %: 64 %
Platelets: 114 10*3/uL — ABNORMAL LOW (ref 150–400)
RBC: 3.72 MIL/uL — ABNORMAL LOW (ref 3.87–5.11)
RDW: 18.2 % — ABNORMAL HIGH (ref 11.5–15.5)
WBC: 6.7 10*3/uL (ref 4.0–10.5)
nRBC: 0 % (ref 0.0–0.2)

## 2020-12-06 LAB — COMPREHENSIVE METABOLIC PANEL
ALT: 16 U/L (ref 0–44)
AST: 21 U/L (ref 15–41)
Albumin: 3.5 g/dL (ref 3.5–5.0)
Alkaline Phosphatase: 67 U/L (ref 38–126)
Anion gap: 7 (ref 5–15)
BUN: 13 mg/dL (ref 8–23)
CO2: 23 mmol/L (ref 22–32)
Calcium: 8.6 mg/dL — ABNORMAL LOW (ref 8.9–10.3)
Chloride: 104 mmol/L (ref 98–111)
Creatinine, Ser: 0.43 mg/dL — ABNORMAL LOW (ref 0.44–1.00)
GFR, Estimated: >60 mL/min (ref >60)
Glucose, Bld: 85 mg/dL (ref 70–99)
Potassium: 3.5 mmol/L (ref 3.5–5.1)
Sodium: 134 mmol/L — ABNORMAL LOW (ref 135–145)
Total Bilirubin: 0.9 mg/dL (ref 0.3–1.2)
Total Protein: 7.2 g/dL (ref 6.5–8.1)

## 2020-12-06 MED ORDER — PROCHLORPERAZINE MALEATE 10 MG PO TABS
10.0000 mg | ORAL_TABLET | Freq: Once | ORAL | Status: AC
  Administered 2020-12-06: 10 mg via ORAL
  Filled 2020-12-06: qty 1

## 2020-12-06 MED ORDER — HEPARIN SOD (PORK) LOCK FLUSH 100 UNIT/ML IV SOLN
500.0000 [IU] | Freq: Once | INTRAVENOUS | Status: DC | PRN
  Filled 2020-12-06: qty 5

## 2020-12-06 MED ORDER — SODIUM CHLORIDE 0.9 % IV SOLN
800.0000 mg | Freq: Once | INTRAVENOUS | Status: AC
  Administered 2020-12-06: 800 mg via INTRAVENOUS
  Filled 2020-12-06: qty 21.04

## 2020-12-06 MED ORDER — SODIUM CHLORIDE 0.9 % IV SOLN
Freq: Once | INTRAVENOUS | Status: AC
  Filled 2020-12-06: qty 250

## 2020-12-06 MED ORDER — HEPARIN SOD (PORK) LOCK FLUSH 100 UNIT/ML IV SOLN
INTRAVENOUS | Status: AC
  Filled 2020-12-06: qty 5

## 2020-12-06 MED ORDER — HEPARIN SOD (PORK) LOCK FLUSH 100 UNIT/ML IV SOLN
500.0000 [IU] | Freq: Once | INTRAVENOUS | Status: AC
  Administered 2020-12-06: 500 [IU] via INTRAVENOUS
  Filled 2020-12-06: qty 5

## 2020-12-06 MED ORDER — SODIUM CHLORIDE 0.9% FLUSH
10.0000 mL | Freq: Once | INTRAVENOUS | Status: AC
  Administered 2020-12-06: 10 mL via INTRAVENOUS
  Filled 2020-12-06: qty 10

## 2020-12-06 MED ORDER — GEMCITABINE HCL CHEMO INJECTION 1 GM/26.3ML
600.0000 mg/m2 | Freq: Once | INTRAVENOUS | Status: DC
  Filled 2020-12-06: qty 23

## 2020-12-06 NOTE — Progress Notes (Signed)
Nutrition Follow-up:   Patient with pancreatic cancer.  Patient to receive chemotherapy today.   Met with patient during infusion.  Patient reports that her appetite has been good and she has been feeling hungry.  Reports that yesterday ate sausage and gravy biscuit (100%) and then ate bacon, egg and cheese biscuit (100%).  Also had a steak, 3-4 reese cups and ice cream (pushup).      Medications: megace  Labs: reviewed  Anthropometrics:   Weight 95 lb today  92 lb on 8/15 96 lb on 7/25 97 lb on 7/18 98 lb on 7/11 100 lb on 6/20 103 lb on 6/9   NUTRITION DIAGNOSIS: Inadequate oral intake improving   INTERVENTION:  Appetite has improved.  Discussed importance of picking high calorie, high protein foods Encouraged patient to drink equate plus shakes daily even if eating well.     MONITORING, EVALUATION, GOAL: weight trends, intake   NEXT VISIT: Monday, Sept 12 during infusion  Delman Goshorn B. Zenia Resides, [place], [place] Registered Dietitian [phone] (mobile)

## 2020-12-06 NOTE — Progress Notes (Signed)
HR 102. Per Beckey Rutter NP okay to proceed with treatment today.

## 2020-12-06 NOTE — Patient Instructions (Signed)
[facility]  Discharge Instructions: Thank you for choosing [facility] to provide your oncology and hematology care.  If you have a lab appointment with the Cancer Center, please go directly to the Cancer Center and check in at the registration area.  Wear comfortable clothing and clothing appropriate for easy access to any Portacath or PICC line.   We strive to give you quality time with your provider. You may need to reschedule your appointment if you arrive late (15 or more minutes).  Arriving late affects you and other patients whose appointments are after yours.  Also, if you miss three or more appointments without notifying the office, you may be dismissed from the clinic at the provider's discretion.      For prescription refill requests, have your pharmacy contact our office and allow 72 hours for refills to be completed.    Today you received the following chemotherapy and/or immunotherapy agents: Gemzar      To help prevent nausea and vomiting after your treatment, we encourage you to take your nausea medication as directed.  BELOW ARE SYMPTOMS THAT SHOULD BE REPORTED IMMEDIATELY: *FEVER GREATER THAN 100.4 F (38 C) OR HIGHER *CHILLS OR SWEATING *NAUSEA AND VOMITING THAT IS NOT CONTROLLED WITH YOUR NAUSEA MEDICATION *UNUSUAL SHORTNESS OF BREATH *UNUSUAL BRUISING OR BLEEDING *URINARY PROBLEMS (pain or burning when urinating, or frequent urination) *BOWEL PROBLEMS (unusual diarrhea, constipation, pain near the anus) TENDERNESS IN MOUTH AND THROAT WITH OR WITHOUT PRESENCE OF ULCERS (sore throat, sores in mouth, or a toothache) UNUSUAL RASH, SWELLING OR PAIN  UNUSUAL VAGINAL DISCHARGE OR ITCHING   Items with * indicate a potential emergency and should be followed up as soon as possible or go to the Emergency Department if any problems should occur.  Please show the CHEMOTHERAPY ALERT CARD or IMMUNOTHERAPY ALERT CARD at check-in to  the Emergency Department and triage nurse.  Should you have questions after your visit or need to cancel or reschedule your appointment, please contact [facility]  [phone] and follow the prompts.  Office hours are 8:00 a.m. to 4:30 p.m. Monday - Friday. Please note that voicemails left after 4:00 p.m. may not be returned until the following business day.  We are closed weekends and major holidays. You have access to a nurse at all times for urgent questions. Please call the main number to the clinic [phone] and follow the prompts.  For any non-urgent questions, you may also contact your provider using MyChart. We now offer e-Visits for anyone 18 and older to request care online for non-urgent symptoms. For details visit [email or web address].   Also download the MyChart app! Go to the app store, search "MyChart", open the app, select [facility], and log in with your MyChart username and password.  Due to Covid, a mask is required upon entering the hospital/clinic. If you do not have a mask, one will be given to you upon arrival. For doctor visits, patients may have 1 support person aged 18 or older with them. For treatment visits, patients cannot have anyone with them due to current Covid guidelines and our immunocompromised population. Gemcitabine injection What is this medication? GEMCITABINE (jem SYE ta been) is a chemotherapy drug. This medicine is used to treat many types of cancer like breast cancer, lung cancer, pancreatic cancer,and ovarian cancer. This medicine may be used for other purposes; ask your health care provider orpharmacist if you have questions. COMMON BRAND NAME(S): Gemzar, Infugem   What should I tell my care team before I take this medication? They need to know if you have any of these conditions: blood disorders infection kidney disease liver disease lung or breathing disease, like asthma recent or ongoing radiation  therapy an unusual or allergic reaction to gemcitabine, other chemotherapy, other medicines, foods, dyes, or preservatives pregnant or trying to get pregnant breast-feeding How should I use this medication? This drug is given as an infusion into a vein. It is administered in a hospitalor clinic by a specially trained health care professional. Talk to your pediatrician regarding the use of this medicine in children.Special care may be needed. Overdosage: If you think you have taken too much of this medicine contact apoison control center or emergency room at once. NOTE: This medicine is only for you. Do not share this medicine with others. What if I miss a dose? It is important not to miss your dose. Call your doctor or health careprofessional if you are unable to keep an appointment. What may interact with this medication? medicines to increase blood counts like filgrastim, pegfilgrastim, sargramostim some other chemotherapy drugs like cisplatin vaccines Talk to your doctor or health care professional before taking any of thesemedicines: acetaminophen aspirin ibuprofen ketoprofen naproxen This list may not describe all possible interactions. Give your health care provider a list of all the medicines, herbs, non-prescription drugs, or dietary supplements you use. Also tell them if you smoke, drink alcohol, or use illegaldrugs. Some items may interact with your medicine. What should I watch for while using this medication? Visit your doctor for checks on your progress. This drug may make you feel generally unwell. This is not uncommon, as chemotherapy can affect healthy cells as well as cancer cells. Report any side effects. Continue your course oftreatment even though you feel ill unless your doctor tells you to stop. In some cases, you may be given additional medicines to help with side effects.Follow all directions for their use. Call your doctor or health care professional for advice if  you get a fever, chills or sore throat, or other symptoms of a cold or flu. Do not treat yourself. This drug decreases your body's ability to fight infections. Try toavoid being around people who are sick. This medicine may increase your risk to bruise or bleed. Call your doctor orhealth care professional if you notice any unusual bleeding. Be careful brushing and flossing your teeth or using a toothpick because you may get an infection or bleed more easily. If you have any dental work done,tell your dentist you are receiving this medicine. Avoid taking products that contain aspirin, acetaminophen, ibuprofen, naproxen, or ketoprofen unless instructed by your doctor. These medicines may hide afever. Do not become pregnant while taking this medicine or for 6 months after stopping it. Women should inform their doctor if they wish to become pregnant or think they might be pregnant. Men should not father a child while taking this medicine and for 3 months after stopping it. There is a potential for serious side effects to an unborn child. Talk to your health care professional or pharmacist for more information. Do not breast-feed an infant while takingthis medicine or for at least 1 week after stopping it. Men should inform their doctors if they wish to father a child. This medicine may lower sperm counts. Talk with your doctor or health care professional ifyou are concerned about your fertility. What side effects may I notice from receiving this medication? Side effects that you should report   to your doctor or health care professionalas soon as possible: allergic reactions like skin rash, itching or hives, swelling of the face, lips, or tongue breathing problems pain, redness, or irritation at site where injected signs and symptoms of a dangerous change in heartbeat or heart rhythm like chest pain; dizziness; fast or irregular heartbeat; palpitations; feeling faint or lightheaded, falls; breathing  problems signs of decreased platelets or bleeding - bruising, pinpoint red spots on the skin, black, tarry stools, blood in the urine signs of decreased red blood cells - unusually weak or tired, feeling faint or lightheaded, falls signs of infection - fever or chills, cough, sore throat, pain or difficulty passing urine signs and symptoms of kidney injury like trouble passing urine or change in the amount of urine signs and symptoms of liver injury like dark yellow or brown urine; general ill feeling or flu-like symptoms; light-colored stools; loss of appetite; nausea; right upper belly pain; unusually weak or tired; yellowing of the eyes or skin swelling of ankles, feet, hands Side effects that usually do not require medical attention (report to yourdoctor or health care professional if they continue or are bothersome): constipation diarrhea hair loss loss of appetite nausea rash vomiting This list may not describe all possible side effects. Call your doctor for medical advice about side effects. You may report side effects to FDA at1-800-FDA-1088. Where should I keep my medication? This drug is given in a hospital or clinic and will not be stored at home. NOTE: This sheet is a summary. It may not cover all possible information. If you have questions about this medicine, talk to your doctor, pharmacist, orhealth care provider.  2022 Elsevier/Gold Standard (2017-06-12 18:06:11)  

## 2020-12-07 ENCOUNTER — Encounter: Payer: Self-pay | Admitting: Internal Medicine

## 2020-12-07 ENCOUNTER — Ambulatory Visit
Admission: RE | Admit: 2020-12-07 | Discharge: 2020-12-07 | Disposition: A | Discharge status: Home / Self Care | Payer: Medicare HMO | Source: Ambulatory Visit | Attending: Radiation Oncology | Admitting: Radiation Oncology

## 2020-12-07 ENCOUNTER — Other Ambulatory Visit: Payer: Self-pay | Admitting: Internal Medicine

## 2020-12-07 DIAGNOSIS — E876 Hypokalemia: Secondary | ICD-10-CM

## 2020-12-07 DIAGNOSIS — Z510 Encounter for antineoplastic radiation therapy: Secondary | ICD-10-CM | POA: Diagnosis not present

## 2020-12-07 NOTE — Progress Notes (Signed)
I spoke to patient on September 7 regarding her recent difficulty breathing issues after chemotherapy. It's not clear to me if chemotherapy is causing the difficulty breathing versus patient's underlying COPD. For now follow up as planned next week. Also discussed that we might end up holding chemotherapy if this is an ongoing issue. patient declined evaluation in symptom man clinic tomorrow.

## 2020-12-07 NOTE — Telephone Encounter (Signed)
  Component Ref Range & Units 1 d ago  (12/06/20) 8 d ago  (11/29/20) 9 d ago  (11/28/20) 9 d ago  (11/28/20)  Potassium 3.5 - 5.1 mmol/L 3.5  4.0  4.0  3.9

## 2020-12-08 ENCOUNTER — Inpatient Hospital Stay: Payer: Medicare HMO

## 2020-12-08 ENCOUNTER — Inpatient Hospital Stay (HOSPITAL_BASED_OUTPATIENT_CLINIC_OR_DEPARTMENT_OTHER): Payer: Medicare HMO | Admitting: Hospice and Palliative Medicine

## 2020-12-08 ENCOUNTER — Ambulatory Visit
Admission: RE | Admit: 2020-12-08 | Discharge: 2020-12-08 | Disposition: A | Discharge status: Home / Self Care | Payer: Medicare HMO | Attending: Hospice and Palliative Medicine | Admitting: Hospice and Palliative Medicine

## 2020-12-08 ENCOUNTER — Encounter: Payer: Self-pay | Admitting: Hospice and Palliative Medicine

## 2020-12-08 ENCOUNTER — Ambulatory Visit
Admission: RE | Admit: 2020-12-08 | Discharge: 2020-12-08 | Disposition: A | Discharge status: Home / Self Care | Payer: Medicare HMO | Source: Ambulatory Visit | Attending: Hospice and Palliative Medicine | Admitting: Hospice and Palliative Medicine

## 2020-12-08 ENCOUNTER — Other Ambulatory Visit: Payer: Self-pay

## 2020-12-08 ENCOUNTER — Ambulatory Visit
Admission: RE | Admit: 2020-12-08 | Discharge: 2020-12-08 | Disposition: A | Discharge status: Home / Self Care | Payer: Medicare HMO | Source: Ambulatory Visit | Attending: Radiation Oncology | Admitting: Radiation Oncology

## 2020-12-08 VITALS — BP 113/83 | HR 104 | Temp 98.6°F | Resp 22 | Wt 95.0 lb

## 2020-12-08 DIAGNOSIS — C250 Malignant neoplasm of head of pancreas: Secondary | ICD-10-CM

## 2020-12-08 DIAGNOSIS — R0689 Other abnormalities of breathing: Secondary | ICD-10-CM

## 2020-12-08 DIAGNOSIS — Z510 Encounter for antineoplastic radiation therapy: Secondary | ICD-10-CM | POA: Diagnosis not present

## 2020-12-08 DIAGNOSIS — R0600 Dyspnea, unspecified: Secondary | ICD-10-CM

## 2020-12-08 DIAGNOSIS — Z5111 Encounter for antineoplastic chemotherapy: Secondary | ICD-10-CM | POA: Diagnosis not present

## 2020-12-08 LAB — CBC WITH DIFFERENTIAL/PLATELET
Abs Immature Granulocytes: 0.03 10*3/uL (ref 0.00–0.07)
Basophils Absolute: 0 10*3/uL (ref 0.0–0.1)
Basophils Relative: 1 %
Eosinophils Absolute: 0 10*3/uL (ref 0.0–0.5)
Eosinophils Relative: 0 %
HCT: 34.7 % — ABNORMAL LOW (ref 36.0–46.0)
Hemoglobin: 11.3 g/dL — ABNORMAL LOW (ref 12.0–15.0)
Immature Granulocytes: 1 %
Lymphocytes Relative: 18 %
Lymphs Abs: 1.2 10*3/uL (ref 0.7–4.0)
MCH: 30.3 pg (ref 26.0–34.0)
MCHC: 32.6 g/dL (ref 30.0–36.0)
MCV: 93 fL (ref 80.0–100.0)
Monocytes Absolute: 0.3 10*3/uL (ref 0.1–1.0)
Monocytes Relative: 5 %
Neutro Abs: 5.1 10*3/uL (ref 1.7–7.7)
Neutrophils Relative %: 75 %
Platelets: 106 10*3/uL — ABNORMAL LOW (ref 150–400)
RBC: 3.73 MIL/uL — ABNORMAL LOW (ref 3.87–5.11)
RDW: 17.9 % — ABNORMAL HIGH (ref 11.5–15.5)
WBC: 6.6 10*3/uL (ref 4.0–10.5)
nRBC: 0 % (ref 0.0–0.2)

## 2020-12-08 LAB — COMPREHENSIVE METABOLIC PANEL
ALT: 14 U/L (ref 0–44)
AST: 19 U/L (ref 15–41)
Albumin: 3.7 g/dL (ref 3.5–5.0)
Alkaline Phosphatase: 65 U/L (ref 38–126)
Anion gap: 7 (ref 5–15)
BUN: 12 mg/dL (ref 8–23)
CO2: 24 mmol/L (ref 22–32)
Calcium: 8.6 mg/dL — ABNORMAL LOW (ref 8.9–10.3)
Chloride: 104 mmol/L (ref 98–111)
Creatinine, Ser: 0.39 mg/dL — ABNORMAL LOW (ref 0.44–1.00)
GFR, Estimated: >60 mL/min (ref >60)
Glucose, Bld: 83 mg/dL (ref 70–99)
Potassium: 3.7 mmol/L (ref 3.5–5.1)
Sodium: 135 mmol/L (ref 135–145)
Total Bilirubin: 1.6 mg/dL — ABNORMAL HIGH (ref 0.3–1.2)
Total Protein: 7.5 g/dL (ref 6.5–8.1)

## 2020-12-08 IMAGING — CR DG CHEST 2V
1 series · 2 of 2 positions shown · non-contrast
Comparison: Chest x-ray [DATE]

CLINICAL DATA: Shortness of breath following chemotherapy 2 weeks
ago. History of pancreatic cancer.

EXAM:
CHEST - 2 VIEW

[Series 1: dg chest 2 view · 0.14mm/px · 2 of 2 slices shown]
[im 1/2]
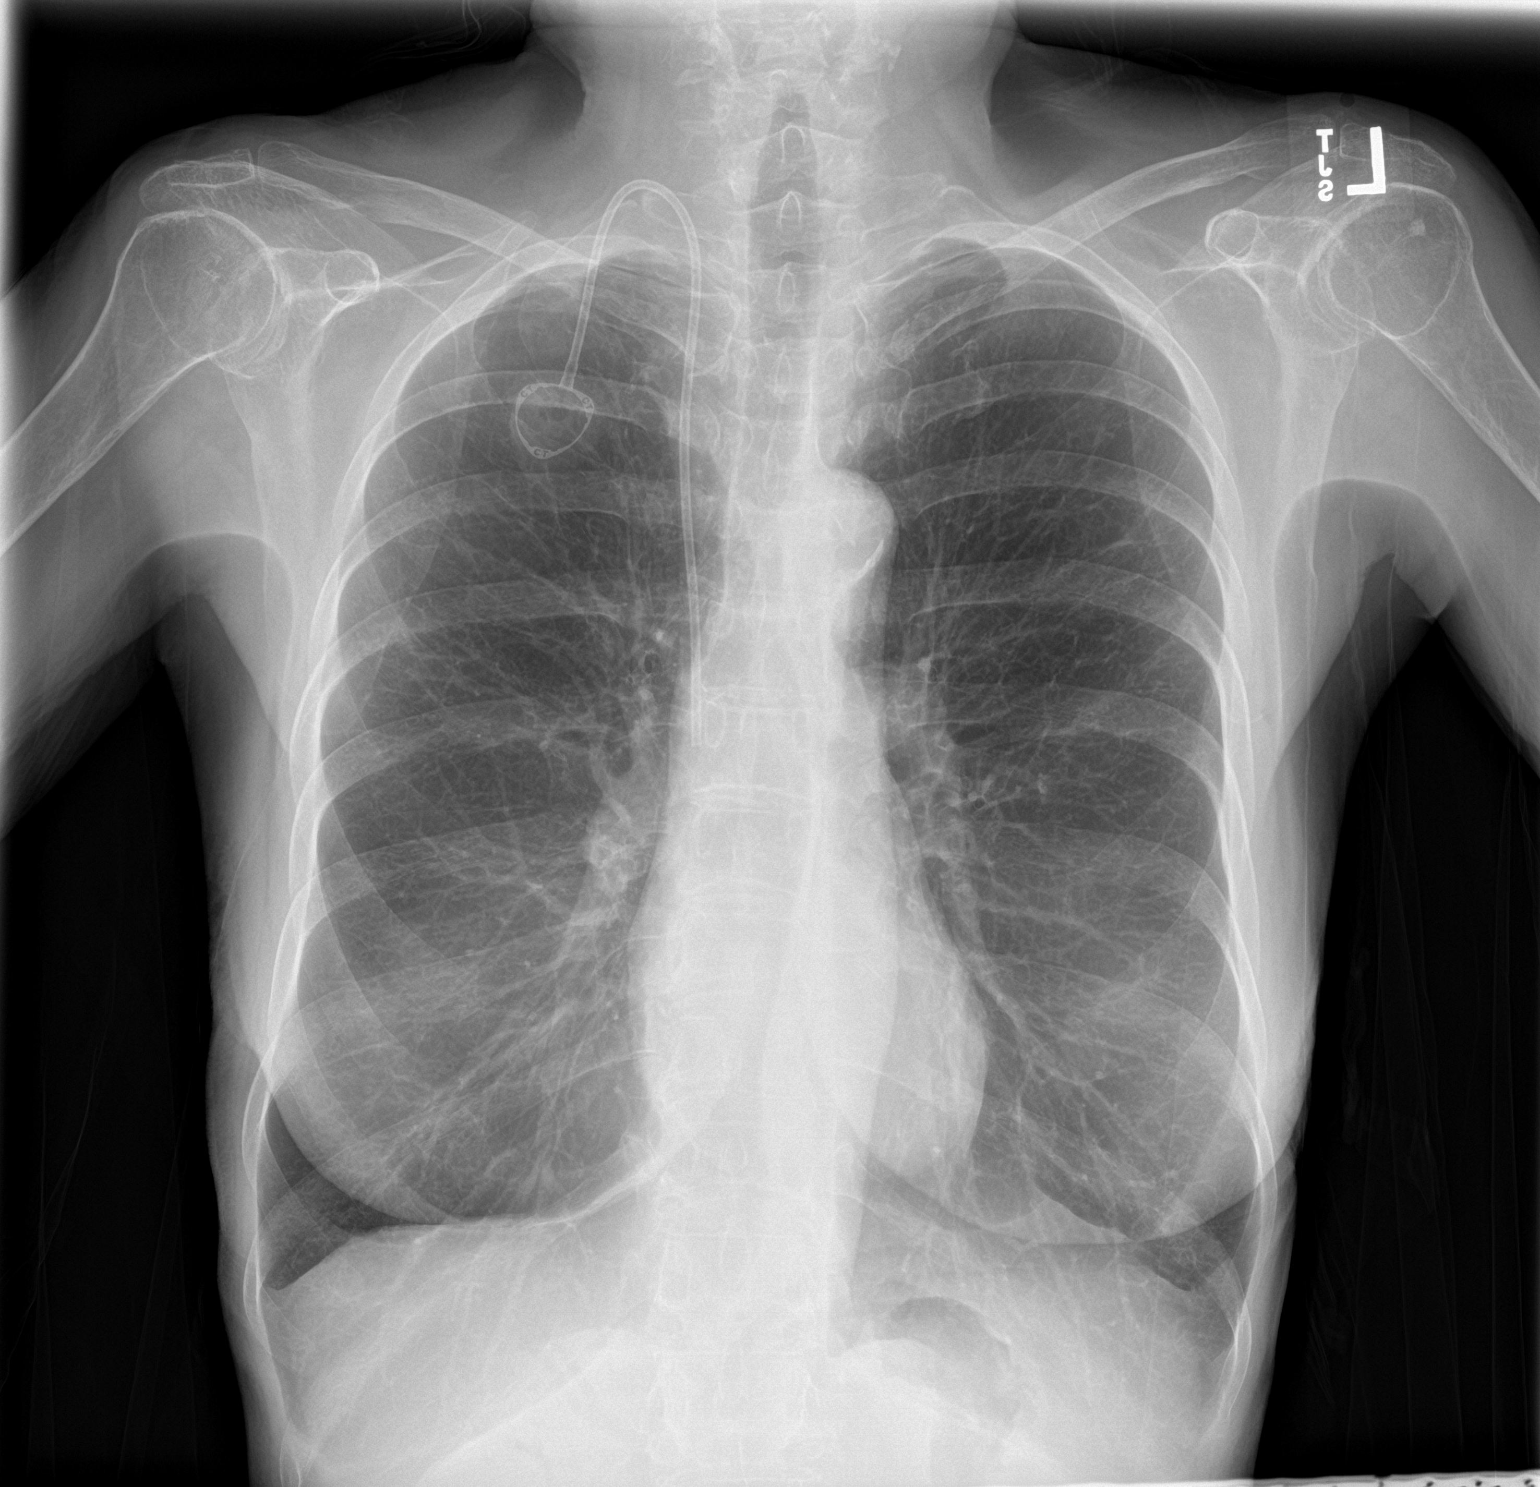
[im 2/2]
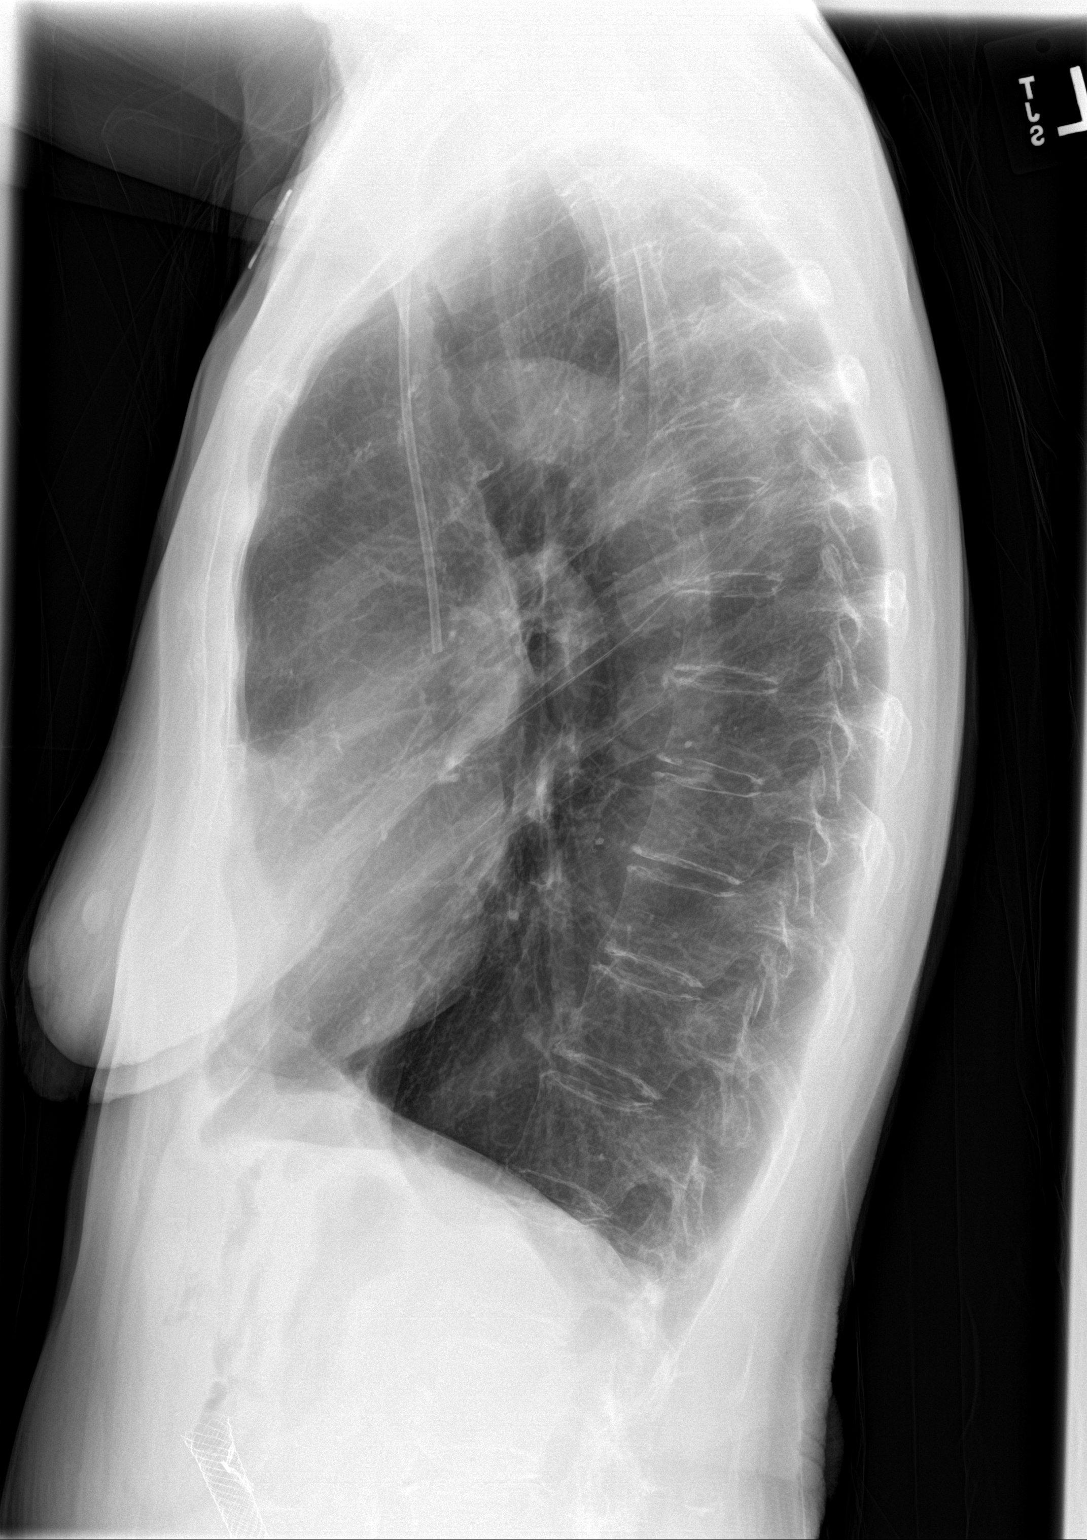

[2 of 2 positions shown; findings below may reference images not displayed]

FINDINGS: The right IJ power port is stable.

The cardiac silhouette, mediastinal and hilar contours are normal.

Stable emphysematous changes with fairly marked hyperinflation but
no acute overlying pulmonary process. No pulmonary lesions. No
pleural effusions. No pneumothorax.

The bony thorax is intact.
IMPRESSION: Emphysematous changes but no acute pulmonary findings.

## 2020-12-08 MED ORDER — SODIUM CHLORIDE 0.9% FLUSH
10.0000 mL | Freq: Once | INTRAVENOUS | Status: AC | PRN
  Administered 2020-12-08: 10 mL
  Filled 2020-12-08: qty 10

## 2020-12-08 MED ORDER — PREDNISONE 10 MG PO TABS: ORAL_TABLET | ORAL | 0 refills | Status: DC

## 2020-12-08 MED ORDER — SODIUM CHLORIDE 0.9 % IV SOLN
Freq: Once | INTRAVENOUS | Status: DC
  Filled 2020-12-08: qty 250

## 2020-12-08 MED ORDER — HEPARIN SOD (PORK) LOCK FLUSH 100 UNIT/ML IV SOLN
500.0000 [IU] | Freq: Once | INTRAVENOUS | Status: AC | PRN
  Administered 2020-12-08: 500 [IU]
  Filled 2020-12-08: qty 5

## 2020-12-08 NOTE — Progress Notes (Signed)
Pt was recently hospitalized after 1st gemzar treatment for acute dyspnea. Pt recovered and took 2nd treatment ant the very same sudden onset of dyspnea. VSS. O2 sat 100%. Denies any other symptoms.

## 2020-12-08 NOTE — Progress Notes (Signed)
Symptom Management [place]  [phone] [phone]  Patient Care Team: Erma Heritage, MD as PCP - General (Family Medicine) Rico Junker, RN as Registered Nurse Theodore Demark, RN as Registered Nurse Adron Bene as Physician Assistant (Gastroenterology) Bary Castilla Forest Gleason, MD (General Surgery) Clent Jacks, RN as Oncology Nurse Navigator Cammie Sickle, MD as Consulting Physician (Hematology and Oncology)   Name of the patient: Shirley Barton  SX:[id number]  [birth year]   Date of visit: 12/08/20  Reason for Consult: Ms. Shirley Barton is a 70 year old woman with multiple medical problems including stage I pancreatic adenocarcinoma previously treated with FOLFIRINOX but she had extremely poor tolerance.  Patient was started on gemcitabine/Abraxane.    Patient was hospitalized 11/28/2020 to 11/29/2020 with hypoxia and shortness of breath following Gemzar treatment.  Symptoms improved with duo nebs and were thought secondary to mild COPD exacerbation.  CTA of the chest on 11/28/2020 did not reveal evidence of PE.  Patient is noted to have severe centrilobular emphysema with diffuse bilateral bronchial wall thickening.  Patient again received Gemzar on 12/06/2020 and reports recurrence of shortness of breath.  She presents to [facility] today for evaluation.  Patient says that shortness of breath is improving and she has been using her nebulizer and Advair inhaler at home.  She denies fever or chills.  No chest pain or tachycardia.  No GI or GU symptoms.  Denies any neurologic complaints. Denies recent fevers or illnesses. Denies any easy bleeding or bruising. Reports good appetite and denies weight loss. Denies chest pain. Denies any nausea, vomiting, constipation, or diarrhea. Denies urinary complaints. Patient offers no further specific complaints today.    PAST MEDICAL HISTORY: Past Medical History:  Diagnosis Date    Abdominal pain, generalized    Anxiety    Atypical chest pain    Back pain    Basal cell carcinoma of skin 2013   resected from Left scalp area.    COPD (chronic obstructive pulmonary disease) (HCC)    Depression    DOE (dyspnea on exertion)    GERD (gastroesophageal reflux disease)    Hypertension    Pancreatic cancer ([place])    Shortness of breath dyspnea     PAST SURGICAL HISTORY:  Past Surgical History:  Procedure Laterality Date   BASAL CELL CARCINOMA EXCISION  2013   CARPAL TUNNEL RELEASE Left 90s   CHOLECYSTECTOMY N/A 09/16/2015   Procedure: LAPAROSCOPIC CHOLECYSTECTOMY WITH INTRAOPERATIVE CHOLANGIOGRAM;  Surgeon: Robert Bellow, MD;  Location: [facility] ORS;  Service: General;  Laterality: N/A;   COLONOSCOPY  1990"s   [place]   ENDOSCOPIC RETROGRADE CHOLANGIOPANCREATOGRAPHY (ERCP) WITH PROPOFOL N/A 11/10/2020   Procedure: ENDOSCOPIC RETROGRADE CHOLANGIOPANCREATOGRAPHY (ERCP) WITH PROPOFOL;  Surgeon: Lucilla Lame, MD;  Location: [facility] ENDOSCOPY;  Service: Endoscopy;  Laterality: N/A;   ERCP N/A 08/05/2020   Procedure: ENDOSCOPIC RETROGRADE CHOLANGIOPANCREATOGRAPHY (ERCP);  Surgeon: Lucilla Lame, MD;  Location: [facility] ENDOSCOPY;  Service: Endoscopy;  Laterality: N/A;   ESOPHAGOGASTRODUODENOSCOPY (EGD) WITH PROPOFOL N/A 02/05/2017   Procedure: ESOPHAGOGASTRODUODENOSCOPY (EGD) WITH PROPOFOL;  Surgeon: Lollie Sails, MD;  Location: [facility] ENDOSCOPY;  Service: Endoscopy;  Laterality: N/A;   PERIPHERAL VASCULAR CATHETERIZATION Bilateral 12/27/2015   Procedure: Lower Extremity Angiography;  Surgeon: Katha Cabal, MD;  Location: [facility] LAB;  Service: Cardiovascular;  Laterality: Bilateral;   PORTA CATH INSERTION N/A 09/08/2020   Procedure: PORTA CATH INSERTION;  Surgeon: Algernon Huxley, MD;  Location: [facility];  Service: Cardiovascular;  Laterality: N/A;   TONSILLECTOMY      HEMATOLOGY/ONCOLOGY HISTORY:  Oncology History Overview Note  # MAY  2022-pancreatic adenocarcinoma [obstructive jaundice-]MRI-18 mm pancreatic head mass; EUS [[facility]; Dr.Spaete]-abutment of portal vein/SMV; no lymphadenopathy noted; May 5 CA 19-9-163; s/p stenting -May 25th-47.   #Obstructive jaundice-s/p ERCP stenting [Dr.Wohl]  MAY 2022- ENDOSONOGRAPHIC FINDING: : A round mass was identified in the pancreatic head.  The mass was hypoechoic. The mass measured 16 mm by 17  mm in maximal cross-sectional diameter. The outer  margins were irregular. There was sonographic evidence  suggesting invasion into the portal vein (manifested  by abutment) and the superior mesenteric vein  (manifested by abutment).   #Pancreatic adenocarcinoma pT1 however on EUS/CT [[facility]. Dr.Shah]-concerning for tumor abutting the celiac axis /common hepatic artery /portal vein/superior mesenteric vein; without any metastatic disease.  Borderline resectable Elevated CA 19-9.  [S/p evaluation at [facility]  # June 9th- FOLFIRINOX cycle #1; discontinued because of poor GI tolerance  # &/01-2021-gemcitabine Abraxane ['100mg'$ /2] day 1 day 815  # July 11th-gemcitabine and Abraxane day.  ---------------------------------------------------------      Malignant neoplasm of head of pancreas ([place])  08/24/2020 Initial Diagnosis   Malignant neoplasm of head of pancreas ([place])   09/09/2020 - 09/12/2020 Chemotherapy          10/10/2020 -  Chemotherapy    Patient is on Treatment Plan: PANCREATIC ABRAXANE / GEMCITABINE D1,8,15 Q28D         ALLERGIES:  is allergic to tramadol and penicillins.  MEDICATIONS:  Current Outpatient Medications  Medication Sig Dispense Refill   acetaminophen (TYLENOL) 325 MG tablet Take 650 mg by mouth every 6 (six) hours as needed for mild pain or moderate pain.     albuterol (VENTOLIN HFA) 108 (90 Base) MCG/ACT inhaler Inhale 2 puffs into the lungs every 6 (six) hours as needed for wheezing or shortness of breath. 8 g 2   B Complex-C-Folic Acid (B  COMPLEX-VITAMIN C-FOLIC ACID) 1 MG tablet Take 1 tablet by mouth daily with breakfast. 30 tablet 0   Fluticasone-Salmeterol (ADVAIR) 100-50 MCG/DOSE AEPB Inhale 1 puff into the lungs 2 (two) times daily.     HYDROcodone-acetaminophen (NORCO) 5-325 MG tablet Take 1 tablet by mouth every 6 (six) hours as needed for moderate pain. 45 tablet 0   ipratropium-albuterol (DUONEB) 0.5-2.5 (3) MG/3ML SOLN Take 3 mLs by nebulization every 4 (four) hours as needed. 360 mL 0   KLOR-CON M20 20 MEQ tablet TAKE 1 TABLET BY MOUTH TWICE A DAY 60 tablet 0   lidocaine-prilocaine (EMLA) cream Apply 30 -45 mins prior to port access. 30 g 0   megestrol (MEGACE ES) 625 MG/5ML suspension Take 5 mLs (625 mg total) by mouth daily. 150 mL 0   pantoprazole (PROTONIX) 40 MG tablet Take 1 tablet (40 mg total) by mouth daily. 90 tablet 0   diphenoxylate-atropine (LOMOTIL) 2.5-0.025 MG tablet Take 1 tablet by mouth 4 (four) times daily as needed for diarrhea or loose stools. (Patient not taking: Reported on 12/08/2020) 60 tablet 1   magic mouthwash w/lidocaine SOLN Take 10 mLs by mouth every 2 (two) hours as needed for mouth pain. 360 mL 0   ondansetron (ZOFRAN) 8 MG tablet One pill every 8 hours as needed for nausea/vomitting. (Patient not taking: Reported on 12/08/2020) 40 tablet 3   prochlorperazine (COMPAZINE) 10 MG tablet Take 1 tablet (10 mg total) by mouth every 6 (six) hours as needed for nausea or vomiting. (Patient  not taking: Reported on 12/08/2020) 40 tablet 3   Current Facility-Administered Medications  Medication Dose Route Frequency Provider Last Rate Last Admin   0.9 %  sodium chloride infusion   Intravenous Once Charlaine Dalton R, MD       heparin lock flush 100 unit/mL  500 Units Intracatheter Once PRN Cammie Sickle, MD       sodium chloride flush (NS) 0.9 % injection 10 mL  10 mL Intracatheter Once PRN Cammie Sickle, MD        VITAL SIGNS: BP 113/83 (BP Location: Left Arm, Patient Position:  Sitting)   Pulse (!) 104   Temp 98.6 F (37 C) (Tympanic)   Resp (!) 22   Wt 95 lb (43.1 kg)   LMP 01/03/1995 (Approximate)   SpO2 100%   BMI 16.31 kg/m  Filed Weights   12/08/20 1025  Weight: 95 lb (43.1 kg)    Estimated body mass index is 16.31 kg/m as calculated from the following:   Height as of 11/14/20: '5\' 4"'$  (1.626 m).   Weight as of this encounter: 95 lb (43.1 kg).  LABS: CBC:    Component Value Date/Time   WBC 6.7 12/06/2020 0917   HGB 11.4 (L) 12/06/2020 0917   HGB 14.0 08/10/2020 1010   HCT 35.1 (L) 12/06/2020 0917   HCT 42.1 08/10/2020 1010   PLT 114 (L) 12/06/2020 0917   PLT 256 08/10/2020 1010   MCV 94.4 12/06/2020 0917   MCV 91 08/10/2020 1010   NEUTROABS 4.3 12/06/2020 0917   NEUTROABS 4.9 08/10/2020 1010   LYMPHSABS 1.6 12/06/2020 0917   LYMPHSABS 1.6 08/10/2020 1010   MONOABS 0.8 12/06/2020 0917   EOSABS 0.1 12/06/2020 0917   EOSABS 0.2 08/10/2020 1010   BASOSABS 0.0 12/06/2020 0917   BASOSABS 0.1 08/10/2020 1010   Comprehensive Metabolic Panel:    Component Value Date/Time   NA 134 (L) 12/06/2020 0917   NA 141 10/06/2015 1406   K 3.5 12/06/2020 0917   CL 104 12/06/2020 0917   CO2 23 12/06/2020 0917   BUN 13 12/06/2020 0917   BUN 7 (L) 10/06/2015 1406   CREATININE 0.43 (L) 12/06/2020 0917   GLUCOSE 85 12/06/2020 0917   CALCIUM 8.6 (L) 12/06/2020 0917   AST 21 12/06/2020 0917   ALT 16 12/06/2020 0917   ALKPHOS 67 12/06/2020 0917   BILITOT 0.9 12/06/2020 0917   BILITOT 0.5 10/06/2015 1406   PROT 7.2 12/06/2020 0917   PROT 7.5 10/06/2015 1406   ALBUMIN 3.5 12/06/2020 0917   ALBUMIN 4.7 10/06/2015 1406    RADIOGRAPHIC STUDIES: CT Angio Chest PE W and/or Wo Contrast  Result Date: 11/28/2020 CLINICAL DATA:  Pancreatic cancer, COPD, shortness of breath, tachycardia, evaluate for PE EXAM: CT ANGIOGRAPHY CHEST WITH CONTRAST TECHNIQUE: Multidetector CT imaging of the chest was performed using the standard protocol during bolus administration  of intravenous contrast. Multiplanar CT image reconstructions and MIPs were obtained to evaluate the vascular anatomy. CONTRAST:  15m OMNIPAQUE IOHEXOL 350 MG/ML SOLN COMPARISON:  None. FINDINGS: Cardiovascular: Satisfactory opacification of the pulmonary arteries to the segmental level. No evidence of pulmonary embolism. Normal heart size. Three-vessel coronary artery calcifications. No pericardial effusion. Aortic atherosclerosis Mediastinum/Nodes: No enlarged mediastinal, hilar, or axillary lymph nodes. Small hiatal hernia. Thyroid gland, trachea, and esophagus demonstrate no significant findings. Lungs/Pleura: Severe centrilobular emphysema. Diffuse bilateral bronchial wall thickening. No pleural effusion or pneumothorax. Upper Abdomen: No acute abnormality. Partially imaged common bile duct stent and post stenting pneumobilia  in the included upper abdomen. Musculoskeletal: No chest wall abnormality. No acute or significant osseous findings. Review of the MIP images confirms the above findings. IMPRESSION: 1. Negative examination for pulmonary embolism. 2. Severe centrilobular emphysema with diffuse bilateral bronchial wall thickening, consistent with nonspecific infectious or inflammatory bronchitis. 3. Coronary artery disease. 4. Partially imaged common bile duct stent and post stenting pneumobilia in the included upper abdomen, in keeping with patient history of pancreatic cancer. Aortic Atherosclerosis (ICD10-I70.0) and Emphysema (ICD10-J43.9). Electronically Signed   By: Eddie Candle M.D.   On: 11/28/2020 19:13   MR LIVER W WO CONTRAST  Result Date: 11/22/2020 CLINICAL DATA:  History of pancreatic cancer, liver lesions identified by prior CT, status post chemotherapy EXAM: MRI ABDOMEN WITHOUT AND WITH CONTRAST TECHNIQUE: Multiplanar multisequence MR imaging of the abdomen was performed both before and after the administration of intravenous contrast. CONTRAST:  81m GADAVIST GADOBUTROL 1 MMOL/ML IV SOLN  COMPARISON:  CT abdomen pelvis, 11/15/2020, MR abdomen, 08/04/2020 FINDINGS: Lower chest: No acute findings. Hepatobiliary: Ill-defined hyperenhancing foci identified by prior CT correspond to transient, ill-defined foci of arterial hyperenhancement on MR without corresponding intrinsic signal abnormality, rim enhancement, or other characteristic features of metastases (series 14, image 47, 31). There are other subcentimeter foci of arterial hyperenhancement not appreciated by CT, for example in the peripheral liver dome, hepatic segment VII (series 14, image 17). At least two of these foci were present on prior MR examination dated 08/04/2020 and other arterially hyperenhancing lesions seen at that time are not present on current examination. There is an additional area of ill-defined, very subtle hyperenhancement of the inferior right lobe of the liver, hepatic segment VI (series 14, image 50). Common bile duct stent remains positioned with tip in the duodenum. Post stenting pneumobilia. 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 No inflammatory changes, or other parenchymal abnormality identified. Mild, diffuse pancreatic ductal dilatation, measuring up to 0.6 cm. Spleen:  Within normal limits in size and appearance. Adrenals/Urinary Tract: No masses identified. No evidence of hydronephrosis. Stomach/Bowel: Visualized portions within the abdomen are unremarkable. Vascular/Lymphatic: Redemonstrated enlarged portacaval lymph nodes measuring up to 1.3 x 0.7 cm (series 14, image 46). Redemonstrated enlarged gastrohepatic ligament lymph node, measuring 0.9 x 0.6 cm (series 14, image 50). Redemonstrated infrarenal abdominal aortic aneurysm with a large burden of eccentric mural thrombus, measuring approximately 3.5 x 3.2 cm by MR (series 16, image 68). Other:  Trace ascites. Musculoskeletal: No  suspicious bone lesions identified. IMPRESSION: 1. Ill-defined hyperenhancing foci identified by prior CT correspond to transient, ill-defined foci of arterial hyperenhancement without corresponding intrinsic signal abnormality, rim enhancement, or other characteristic features of metastases. There is an additional area of ill-defined, very subtle hyperenhancement of the inferior right lobe of the liver, hepatic segment VI. Some of these lesions were present on prior MR dated 08/04/2020, other lesions present at that time are no longer noted. Findings are most consistent with transient hepatic intensity differences, most likely related to parenchymal perfusion differences in the setting of portal vein occlusion. Metastatic disease is not favored, although attention is warranted on follow-up. 2. 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 3. Common bile duct stent remains in position. 4. Redemonstrated enlarged portacaval and gastrohepatic ligament lymph nodes, suspicious for nodal metastatic disease as seen on  prior CT. 5. Redemonstrated infrarenal abdominal aortic aneurysm with a large burden of eccentric mural thrombus, measuring approximately 3.5 x 3.2 cm by MR. 6. Trace ascites. Aortic Atherosclerosis (ICD10-I70.0). Electronically Signed   By: Eddie Candle M.D.   On: 11/22/2020 07:47   DG Chest Portable 1 View  Result Date: 11/28/2020 CLINICAL DATA:  Shortness of breath history of pancreatic carcinoma EXAM: PORTABLE CHEST 1 VIEW COMPARISON:  None. FINDINGS: Cardiac shadow is within normal limits. Aortic calcifications are seen. Right chest wall port is noted in satisfactory position. Lungs are well aerated without focal infiltrate. No acute bony abnormality is seen. IMPRESSION: No acute abnormality noted. Electronically Signed   By: Inez Catalina M.D.   On: 11/28/2020 16:31   DG C-Arm 1-60 Min-No Report  Result  Date: 11/10/2020 Fluoroscopy was utilized by the requesting physician.  No radiographic interpretation.   CT PANCREAS ABD W/WO  Result Date: 11/15/2020 CLINICAL DATA:  Pancreatic cancer.  Restaging. EXAM: CT ABDOMEN WITHOUT AND WITH CONTRAST TECHNIQUE: Multidetector CT imaging of the abdomen was performed following the standard protocol before and following the bolus administration of intravenous contrast. CONTRAST:  39m OMNIPAQUE IOHEXOL 350 MG/ML SOLN COMPARISON:  MRI 08/04/2020.  CT scan 08/04/2020. FINDINGS: Lower chest: Emphysema. Hepatobiliary: Hypervascular lesion identified in the peripheral anterior right liver (image 15/series 4) measuring about 8 mm. Similar hypervascular lesion noted inferior right liver on 51/4 measuring about 7 mm. Neither of these was visualized on the previous MRI. Hypervascular right hepatic lesion seen on the previous MRIs not evident by CT today. Subtle subcapsular area of focal mild arterial phase hyperenhancement identified inferior right liver on 56/4, not seen on prior studies. Finally a tiny 5 mm focus of arterial phase hyperenhancement noted inferior tip right liver on 71/4. Mild intra hepatic biliary duct dilatation is associated with pneumobilia. Common bile duct stent visualized in situ. Pancreas: Pancreatic head lesion measures 1.6 x 1.1 cm today which compares to 1.8 x 1.1 cm on previous CT when remeasured in a similar fashion. Diffuse pancreatic parenchymal atrophy noted with mild diffuse distention of the main pancreatic duct. Spleen: Spleen measures 10.7 x 10.6 x 7.8 cm (442 cc). Adrenals/Urinary Tract: No adrenal nodule or mass. Tiny hypodensity lower pole right kidney is too small to characterize but likely benign. Left kidney unremarkable. Stomach/Bowel: Stomach is nondistended Duodenum is normally positioned as is the ligament of Treitz. No small bowel or colonic dilatation within the visualized abdomen. Vascular/Lymphatic: Abdominal aortic aneurysm again  noted measuring 3.5 x 3.4 cm. A collar of abnormal soft tissue abuts the celiac axis over a circumference of 180 degrees from 12:00 to 6:00 positions (see axial 43/series 18). Fat planes around the SMA appear preserved. Portal vein is markedly attenuated with only a string of patent lumen visible on coronal image 34 of series 14. The 11 mm portal caval node on 67/18 has increased from 5 mm previously. 7 mm short axis hepatoduodenal ligament lymph node on 56/18 was 4 mm previously. Small retroaortic lymph nodes are minimally bigger in the interval. Paraesophageal varices evident. Bilateral common iliac artery stents are again noted. Other: No intraperitoneal free fluid. Musculoskeletal: No worrisome lytic or sclerotic osseous abnormality. IMPRESSION: 1. No substantial interval change in the pancreatic head lesion with diffuse pancreatic parenchymal atrophy and mild diffuse distention of the main pancreatic duct. 2. Interval development of several tiny hypervascular lesions in the right liver, indeterminate but metastatic disease not excluded. 3. Progression of hepatoduodenal ligament and retroperitoneal lymphadenopathy, concerning for metastatic  disease. 4. Abnormal soft tissue abuts the celiac axis over a circumference of 180 degrees from 12:00 to 6:00 positions. Imaging features relatively stable in the interval. 5. Fat planes around the SMA appear preserved. 6. Marked attenuation of the main portal vein with only a string of patent lumen visible. 7. Paraesophageal varices with borderline splenomegaly evident. 8. Abdominal aortic aneurysm measuring up to 3.5 cm, similar to prior. 9. Aortic Atherosclerosis (ICD10-I70.0). Electronically Signed   By: Misty Stanley M.D.   On: 11/15/2020 17:50    PERFORMANCE STATUS (ECOG) : 1 - Symptomatic but completely ambulatory  Review of Systems Unless otherwise noted, a complete review of systems is negative.  Physical Exam General: NAD Cardiovascular: regular rate and  rhythm Pulmonary: clear anterior/posterior fields Abdomen: soft, nontender, + bowel sounds GU: no suprapubic tenderness Extremities: no edema, no joint deformities Skin: no rashes Neurological: Weakness but otherwise nonfocal  Assessment and Plan- Patient is a 70 y.o. female COPD and stage I pancreatic cancer on treatment with Gemzar who was recently hospitalized with shortness of breath and presents to [facility] for same    Shortness of breath -patient is nonhypoxic (either at rest or ambulating) and nontoxic-appearing.  She reports that shortness of breath is improving.  The fact that shortness of breath recurred with Gemzar treatment is concerning that this could be chemo induced.  Gemzar is associated with roughly 20% recurrence of dyspnea.  Discussed with Dr. Rogue Bussing who is scheduled to see patient next week and can discuss possible changes to treatment regimen.  In the interim, we will send patient for a chest x-ray and start her on prednisone taper.  We will also send referral for pulmonary eval given underlying COPD.  Discussed ER triggers with patient.  Case and plan discussed with Dr. Rogue Bussing  Patient expressed understanding and was in agreement with this plan. She also understands that She can call clinic at any time with any questions, concerns, or complaints.   Thank you for allowing me to participate in the care of this very pleasant patient.   Time Total: 25 minutes  Visit consisted of counseling and education dealing with the complex and emotionally intense issues of symptom management and palliative care in the setting of serious and potentially life-threatening illness.Greater than 50%  of this time was spent counseling and coordinating care related to the above assessment and plan.  Signed by: Altha Harm, PhD, NP-C

## 2020-12-09 ENCOUNTER — Ambulatory Visit
Admission: RE | Admit: 2020-12-09 | Discharge: 2020-12-09 | Disposition: A | Discharge status: Home / Self Care | Payer: Medicare HMO | Source: Ambulatory Visit | Attending: Radiation Oncology | Admitting: Radiation Oncology

## 2020-12-09 ENCOUNTER — Other Ambulatory Visit: Payer: Self-pay | Admitting: Internal Medicine

## 2020-12-09 DIAGNOSIS — Z510 Encounter for antineoplastic radiation therapy: Secondary | ICD-10-CM | POA: Diagnosis not present

## 2020-12-09 DIAGNOSIS — E876 Hypokalemia: Secondary | ICD-10-CM

## 2020-12-09 LAB — CANCER ANTIGEN 19-9: CA 19-9: 25 U/mL (ref 0–35)

## 2020-12-12 ENCOUNTER — Ambulatory Visit
Admission: RE | Admit: 2020-12-12 | Discharge: 2020-12-12 | Disposition: A | Discharge status: Home / Self Care | Payer: Medicare HMO | Source: Ambulatory Visit | Attending: Radiation Oncology | Admitting: Radiation Oncology

## 2020-12-12 ENCOUNTER — Inpatient Hospital Stay (HOSPITAL_BASED_OUTPATIENT_CLINIC_OR_DEPARTMENT_OTHER): Payer: Medicare HMO | Admitting: Internal Medicine

## 2020-12-12 ENCOUNTER — Inpatient Hospital Stay: Payer: Medicare HMO

## 2020-12-12 ENCOUNTER — Encounter: Payer: Self-pay | Admitting: Internal Medicine

## 2020-12-12 ENCOUNTER — Other Ambulatory Visit: Payer: Self-pay

## 2020-12-12 DIAGNOSIS — C250 Malignant neoplasm of head of pancreas: Secondary | ICD-10-CM

## 2020-12-12 DIAGNOSIS — Z5111 Encounter for antineoplastic chemotherapy: Secondary | ICD-10-CM | POA: Diagnosis not present

## 2020-12-12 DIAGNOSIS — Z510 Encounter for antineoplastic radiation therapy: Secondary | ICD-10-CM | POA: Diagnosis not present

## 2020-12-12 LAB — COMPREHENSIVE METABOLIC PANEL
ALT: 19 U/L (ref 0–44)
AST: 19 U/L (ref 15–41)
Albumin: 3.7 g/dL (ref 3.5–5.0)
Alkaline Phosphatase: 71 U/L (ref 38–126)
Anion gap: 7 (ref 5–15)
BUN: 21 mg/dL (ref 8–23)
CO2: 25 mmol/L (ref 22–32)
Calcium: 9.1 mg/dL (ref 8.9–10.3)
Chloride: 104 mmol/L (ref 98–111)
Creatinine, Ser: 0.53 mg/dL (ref 0.44–1.00)
GFR, Estimated: >60 mL/min (ref >60)
Glucose, Bld: 83 mg/dL (ref 70–99)
Potassium: 3.1 mmol/L — ABNORMAL LOW (ref 3.5–5.1)
Sodium: 136 mmol/L (ref 135–145)
Total Bilirubin: 0.7 mg/dL (ref 0.3–1.2)
Total Protein: 7.2 g/dL (ref 6.5–8.1)

## 2020-12-12 LAB — CBC WITH DIFFERENTIAL/PLATELET
Abs Immature Granulocytes: 0.03 10*3/uL (ref 0.00–0.07)
Basophils Absolute: 0 10*3/uL (ref 0.0–0.1)
Basophils Relative: 0 %
Eosinophils Absolute: 0.1 10*3/uL (ref 0.0–0.5)
Eosinophils Relative: 2 %
HCT: 34.6 % — ABNORMAL LOW (ref 36.0–46.0)
Hemoglobin: 11 g/dL — ABNORMAL LOW (ref 12.0–15.0)
Immature Granulocytes: 1 %
Lymphocytes Relative: 27 %
Lymphs Abs: 1.6 10*3/uL (ref 0.7–4.0)
MCH: 30.1 pg (ref 26.0–34.0)
MCHC: 31.8 g/dL (ref 30.0–36.0)
MCV: 94.8 fL (ref 80.0–100.0)
Monocytes Absolute: 0.4 10*3/uL (ref 0.1–1.0)
Monocytes Relative: 7 %
Neutro Abs: 3.8 10*3/uL (ref 1.7–7.7)
Neutrophils Relative %: 63 %
Platelets: 106 10*3/uL — ABNORMAL LOW (ref 150–400)
RBC: 3.65 MIL/uL — ABNORMAL LOW (ref 3.87–5.11)
RDW: 17 % — ABNORMAL HIGH (ref 11.5–15.5)
WBC: 6 10*3/uL (ref 4.0–10.5)
nRBC: 0 % (ref 0.0–0.2)

## 2020-12-12 MED ORDER — HEPARIN SOD (PORK) LOCK FLUSH 100 UNIT/ML IV SOLN
500.0000 [IU] | Freq: Once | INTRAVENOUS | Status: DC | PRN
  Filled 2020-12-12: qty 5

## 2020-12-12 MED ORDER — SODIUM CHLORIDE 0.9% FLUSH
10.0000 mL | Freq: Once | INTRAVENOUS | Status: DC | PRN
  Filled 2020-12-12: qty 10

## 2020-12-12 NOTE — Progress Notes (Signed)
Nutrition  RD planning to see patient in infusion today. Infusion cancelled.    RD called patient but was unable to reach her at ~1pm.  Left message with call back number.   Aaidyn San B. Zenia Resides, [place], [place] Registered Dietitian [phone] (mobile)

## 2020-12-12 NOTE — Assessment & Plan Note (Addendum)
#  Pancreatic adenocarcinoma/locally advanced/metastatic disease [question liver lesions; abdominal/retroperitoneal adenopathy-CT scan November 15, 2020].  Discontinued neoadjuvant chemotherapy.  Discussed the goal of therapy would be is to control the disease/palliative basis.   #Patient currently on gemcitabine-weekly along with radiation;[until October 11th].  However given poor tolerance to gemcitabine-given significant shortness of breath post infusion-we will discontinue gemcitabine.  Continue radiation.  Discussed that we will plan to get a CT scan approximately a month postradiation.  Can consider Xeloda/infusion of 5-FU liposomal irinotecan  #Worsening difficulty breathing post gemcitabine-question gemcitabine induced lung toxicity.  Chest x-ray negative/ COPD.   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  Start patient on Megace.  Discussed the potential concern for  Stable.  # Chronic Diarrhea/adominal pain - on norco at nght.Marland Kitchen   #Recurrent biliary obstruction/elevated LFTs-s/p repeat ERCP [AUG 11th, Dr.Wohl]-stable  # DISPOSITION: de-access # NO chemo today # Follow up in 2 weeks- MD; labs- cbc/cmp;ca-19-9- NO chemo; possible iVFS Dr.B

## 2020-12-12 NOTE — Progress Notes (Signed)
[place] NOTE  Patient Care Team: Erma Heritage, MD as PCP - General (Family Medicine) Rico Junker, RN as Registered Nurse Theodore Demark, RN as Registered Nurse Ezzard Standing, PA-C as Physician Assistant (Gastroenterology) Bary Castilla, Forest Gleason, MD (General Surgery) Clent Jacks, RN as Oncology Nurse Navigator Cammie Sickle, MD as Consulting Physician (Hematology and Oncology)  CHIEF COMPLAINTS/PURPOSE OF CONSULTATION: Pancreatic cancer  #  Oncology History Overview Note  # MAY 2022-pancreatic adenocarcinoma [obstructive jaundice-]MRI-18 mm pancreatic head mass; EUS [[facility]; Dr.Spaete]-abutment of portal vein/SMV; no lymphadenopathy noted; May 5 CA 19-9-163; s/p stenting -May 25th-47.   #Obstructive jaundice-s/p ERCP stenting [Dr.Wohl]  MAY 2022- ENDOSONOGRAPHIC FINDING: : A round mass was identified in the pancreatic head.  The mass was hypoechoic. The mass measured 16 mm by 17  mm in maximal cross-sectional diameter. The outer  margins were irregular. There was sonographic evidence  suggesting invasion into the portal vein (manifested  by abutment) and the superior mesenteric vein  (manifested by abutment).   #Pancreatic adenocarcinoma pT1 however on EUS/CT [[facility]. Dr.Shah]-concerning for tumor abutting the celiac axis /common hepatic artery /portal vein/superior mesenteric vein; without any metastatic disease.  Borderline resectable Elevated CA 19-9.  [S/p evaluation at [place]  # June 9th- FOLFIRINOX cycle #1; discontinued because of poor GI tolerance  # &/01-2021-gemcitabine Abraxane ['100mg'$ /2] day 1 day 815  # July 11th-gemcitabine and Abraxane day.  November 15, 2020-CT scan progressive disease/poor tolerance to therapy  # AUG 29th, 2022-gemcitabine single agent weekly-concurrent radiation;  December 12, 2020-discontinued gemcitabine single agent because of significant respiratory distress post gemcitabine infusion.   Continue local radiation ---------------------------------------------------------      Malignant neoplasm of head of pancreas ([place])  08/24/2020 Initial Diagnosis   Malignant neoplasm of head of pancreas ([place])   09/09/2020 - 09/12/2020 Chemotherapy          10/10/2020 -  Chemotherapy    Patient is on Treatment Plan: PANCREATIC ABRAXANE / GEMCITABINE D1,8,15 Q28D          HISTORY OF PRESENTING ILLNESS: Caucasian female patient ambulating independently.  Accompanied by her son. [name] 70 y.o.  female with a history of COPD; chronic abdominal pain-pancreatic cancer with poor tolerance to neoadjuvant chemotherapy-locally advanced/metastatic disease is here for follow-up.  Patient is currently on gemcitabine weekly along with radiation.  Patient s/p each cycle of gemcitabine complains of worsening shortness of breath.  Patient with a first episode was evaluated emergency room.  CTA negative for any PE.  After second episode of gemcitabine weekly-patient noted to have extreme shortness of breath needing steroids to improve her breathing.  Chest x-ray negative.  No constipation chronic mild diarrhea.  Review of Systems  Constitutional:  Positive for malaise/fatigue and weight loss. Negative for chills, diaphoresis and fever.  HENT:  Negative for nosebleeds and sore throat.   Eyes:  Negative for double vision.  Respiratory:  Positive for shortness of breath. Negative for cough, hemoptysis, sputum production and wheezing.   Cardiovascular:  Negative for chest pain, palpitations, orthopnea and leg swelling.  Gastrointestinal:  Positive for abdominal pain and diarrhea. Negative for blood in stool, constipation, heartburn, melena and vomiting.  Genitourinary:  Negative for dysuria, frequency and urgency.  Musculoskeletal:  Negative for back pain and joint pain.  Skin: Negative.  Negative for itching and rash.  Neurological:  Negative for dizziness, tingling, focal weakness,  weakness and headaches.  Endo/Heme/Allergies:  Does not bruise/bleed easily.  Psychiatric/Behavioral:  Negative for depression. The patient is not nervous/anxious and does not have insomnia.     MEDICAL HISTORY:  Past Medical History:  Diagnosis Date   Abdominal pain, generalized    Anxiety    Atypical chest pain    Back pain    Basal cell carcinoma of skin 07-10-2011   resected from Left scalp area.    COPD (chronic obstructive pulmonary disease) (HCC)    Depression    DOE (dyspnea on exertion)    GERD (gastroesophageal reflux disease)    Hypertension    Pancreatic cancer ([place])    Shortness of breath dyspnea     SURGICAL HISTORY: Past Surgical History:  Procedure Laterality Date   BASAL CELL CARCINOMA EXCISION  2011/07/10   CARPAL TUNNEL RELEASE Left 90s   CHOLECYSTECTOMY N/A 09/16/2015   Procedure: LAPAROSCOPIC CHOLECYSTECTOMY WITH INTRAOPERATIVE CHOLANGIOGRAM;  Surgeon: Robert Bellow, MD;  Location: [facility] ORS;  Service: General;  Laterality: N/A;   COLONOSCOPY  1990"s   [facility]   ENDOSCOPIC RETROGRADE CHOLANGIOPANCREATOGRAPHY (ERCP) WITH PROPOFOL N/A 11/10/2020   Procedure: ENDOSCOPIC RETROGRADE CHOLANGIOPANCREATOGRAPHY (ERCP) WITH PROPOFOL;  Surgeon: Lucilla Lame, MD;  Location: [facility] ENDOSCOPY;  Service: Endoscopy;  Laterality: N/A;   ERCP N/A 08/05/2020   Procedure: ENDOSCOPIC RETROGRADE CHOLANGIOPANCREATOGRAPHY (ERCP);  Surgeon: Lucilla Lame, MD;  Location: [facility] ENDOSCOPY;  Service: Endoscopy;  Laterality: N/A;   ESOPHAGOGASTRODUODENOSCOPY (EGD) WITH PROPOFOL N/A 02/05/2017   Procedure: ESOPHAGOGASTRODUODENOSCOPY (EGD) WITH PROPOFOL;  Surgeon: Lollie Sails, MD;  Location: [facility] ENDOSCOPY;  Service: Endoscopy;  Laterality: N/A;   PERIPHERAL VASCULAR CATHETERIZATION Bilateral 12/27/2015   Procedure: Lower Extremity Angiography;  Surgeon: Katha Cabal, MD;  Location: [place] CV LAB;  Service: Cardiovascular;  Laterality: Bilateral;   PORTA CATH INSERTION N/A  09/08/2020   Procedure: PORTA CATH INSERTION;  Surgeon: Algernon Huxley, MD;  Location: [place] CV LAB;  Service: Cardiovascular;  Laterality: N/A;   TONSILLECTOMY      SOCIAL HISTORY: Social History   Socioeconomic History   Marital status: Divorced    Spouse name: Not on file   Number of children: Not on file   Years of education: Not on file   Highest education level: Not on file  Occupational History   Occupation: retired  Tobacco Use   Smoking status: Former    Types: Cigarettes    Quit date: 03/02/2012    Years since quitting: 8.7   Smokeless tobacco: Never  Vaping Use   Vaping Use: Never used  Substance and Sexual Activity   Alcohol use: No    Alcohol/week: 0.0 standard drinks   Drug use: No   Sexual activity: Not on file  Other Topics Concern   Not on file  Social History Narrative   [place]; self; smoker- quit Jul 10, 2011; daughter died of leukemia-2020. 2 sons [[place]; [place]]; used to be caregiver/ retd.    Social Determinants of Health   Financial Resource Strain: Not on file  Food Insecurity: Not on file  Transportation Needs: Not on file  Physical Activity: Not on file  Stress: Not on file  Social Connections: Not on file  Intimate Partner Violence: Not on file    FAMILY HISTORY: Family History  Problem Relation Age of Onset   Cerebral palsy Daughter    Colon polyps Sister    Leukemia Daughter    Depression Son    Breast cancer Neg Hx     ALLERGIES:  is allergic to tramadol and penicillins.  MEDICATIONS:  Current Outpatient Medications  Medication  Sig Dispense Refill   acetaminophen (TYLENOL) 325 MG tablet Take 650 mg by mouth every 6 (six) hours as needed for mild pain or moderate pain.     albuterol (VENTOLIN HFA) 108 (90 Base) MCG/ACT inhaler Inhale 2 puffs into the lungs every 6 (six) hours as needed for wheezing or shortness of breath. 8 g 2   B Complex-C-Folic Acid (B COMPLEX-VITAMIN C-FOLIC ACID) 1 MG tablet Take 1 tablet by mouth  daily with breakfast. 30 tablet 0   Cetirizine HCl (ZYRTEC ALLERGY PO) Take 1 tablet by mouth daily as needed (allergies).     diphenoxylate-atropine (LOMOTIL) 2.5-0.025 MG tablet Take 1 tablet by mouth 4 (four) times daily as needed for diarrhea or loose stools. 60 tablet 1   Fluticasone-Salmeterol (ADVAIR) 100-50 MCG/DOSE AEPB Inhale 1 puff into the lungs 2 (two) times daily.     HYDROcodone-acetaminophen (NORCO) 5-325 MG tablet Take 1 tablet by mouth every 6 (six) hours as needed for moderate pain. 45 tablet 0   ipratropium-albuterol (DUONEB) 0.5-2.5 (3) MG/3ML SOLN Take 3 mLs by nebulization every 4 (four) hours as needed. 360 mL 0   KLOR-CON M20 20 MEQ tablet TAKE 1 TABLET BY MOUTH TWICE A DAY 60 tablet 0   lidocaine-prilocaine (EMLA) cream Apply 30 -45 mins prior to port access. 30 g 0   magic mouthwash w/lidocaine SOLN Take 10 mLs by mouth every 2 (two) hours as needed for mouth pain. 360 mL 0   megestrol (MEGACE ES) 625 MG/5ML suspension Take 5 mLs (625 mg total) by mouth daily. 150 mL 0   ondansetron (ZOFRAN) 8 MG tablet One pill every 8 hours as needed for nausea/vomitting. 40 tablet 3   pantoprazole (PROTONIX) 40 MG tablet Take 1 tablet (40 mg total) by mouth daily. 90 tablet 0   predniSONE (DELTASONE) 10 MG tablet Take 4 tablets ('40mg'$ ) x 2 days, then take 3 tablets ('30mg'$ ) x 2 days, then take 2 tablets ('20mg'$ ) x 2 days, then take 1 tablet ('10mg'$ ) x 2 days, then stop 20 tablet 0   prochlorperazine (COMPAZINE) 10 MG tablet Take 1 tablet (10 mg total) by mouth every 6 (six) hours as needed for nausea or vomiting. 40 tablet 3   No current facility-administered medications for this visit.      Marland Kitchen  PHYSICAL EXAMINATION: ECOG PERFORMANCE STATUS: 0 - Asymptomatic  Vitals:   12/12/20 0855 12/12/20 0901  BP: (!) 172/101 (!) 181/94  Pulse: 90 90  Resp: (!) 22   Temp: 97.6 F (36.4 C)   SpO2: 100%    Filed Weights   12/12/20 0855  Weight: 96 lb 9.6 oz (43.8 kg)    Physical  Exam HENT:     Head: Normocephalic and atraumatic.     Mouth/Throat:     Pharynx: No oropharyngeal exudate.  Eyes:     Pupils: Pupils are equal, round, and reactive to light.  Cardiovascular:     Rate and Rhythm: Normal rate and regular rhythm.  Pulmonary:     Effort: No respiratory distress.     Breath sounds: No wheezing.     Comments: Decreased air entry bilaterally.  No wheeze or crackles Abdominal:     General: Bowel sounds are normal. There is no distension.     Palpations: Abdomen is soft. There is no mass.     Tenderness: There is no abdominal tenderness. There is no guarding or rebound.  Musculoskeletal:        General: No tenderness. Normal range of motion.  Cervical back: Normal range of motion and neck supple.  Skin:    General: Skin is warm.  Neurological:     Mental Status: She is alert and oriented to person, place, and time.  Psychiatric:        Mood and Affect: Affect normal.     LABORATORY DATA:  I have reviewed the data as listed Lab Results  Component Value Date   WBC 6.0 12/12/2020   HGB 11.0 (L) 12/12/2020   HCT 34.6 (L) 12/12/2020   MCV 94.8 12/12/2020   PLT 106 (L) 12/12/2020   Recent Labs    08/04/20 1138 08/06/20 0457 12/06/20 0917 12/08/20 0959 12/12/20 0814  NA  --    < > 134* 135 136  K  --    < > 3.5 3.7 3.1*  CL  --    < > 104 104 104  CO2  --    < > '23 24 25  '$ GLUCOSE  --    < > 85 83 83  BUN  --    < > '13 12 21  '$ CREATININE  --    < > 0.43* 0.39* 0.53  CALCIUM  --    < > 8.6* 8.6* 9.1  GFRNONAA  --    < > >60 >60 >60  PROT  --    < > 7.2 7.5 7.2  ALBUMIN  --    < > 3.5 3.7 3.7  AST  --    < > '21 19 19  '$ ALT  --    < > '16 14 19  '$ ALKPHOS  --    < > 67 65 71  BILITOT  --    < > 0.9 1.6* 0.7  BILIDIR 3.7*  --   --   --   --    < > = values in this interval not displayed.    RADIOGRAPHIC STUDIES: I have personally reviewed the radiological images as listed and agreed with the findings in the report. DG Chest 2  View  Result Date: 12/08/2020 CLINICAL DATA:  Shortness of breath following chemotherapy 2 weeks ago. History of pancreatic cancer. EXAM: CHEST - 2 VIEW COMPARISON:  Chest x-ray 11/28/2020 FINDINGS: The right IJ power port is stable. The cardiac silhouette, mediastinal and hilar contours are normal. Stable emphysematous changes with fairly marked hyperinflation but no acute overlying pulmonary process. No pulmonary lesions. No pleural effusions. No pneumothorax. The bony thorax is intact. IMPRESSION: Emphysematous changes but no acute pulmonary findings. Electronically Signed   By: Marijo Sanes M.D.   On: 12/08/2020 12:25   CT Angio Chest PE W and/or Wo Contrast  Result Date: 11/28/2020 CLINICAL DATA:  Pancreatic cancer, COPD, shortness of breath, tachycardia, evaluate for PE EXAM: CT ANGIOGRAPHY CHEST WITH CONTRAST TECHNIQUE: Multidetector CT imaging of the chest was performed using the standard protocol during bolus administration of intravenous contrast. Multiplanar CT image reconstructions and MIPs were obtained to evaluate the vascular anatomy. CONTRAST:  32m OMNIPAQUE IOHEXOL 350 MG/ML SOLN COMPARISON:  None. FINDINGS: Cardiovascular: Satisfactory opacification of the pulmonary arteries to the segmental level. No evidence of pulmonary embolism. Normal heart size. Three-vessel coronary artery calcifications. No pericardial effusion. Aortic atherosclerosis Mediastinum/Nodes: No enlarged mediastinal, hilar, or axillary lymph nodes. Small hiatal hernia. Thyroid gland, trachea, and esophagus demonstrate no significant findings. Lungs/Pleura: Severe centrilobular emphysema. Diffuse bilateral bronchial wall thickening. No pleural effusion or pneumothorax. Upper Abdomen: No acute abnormality. Partially imaged common bile duct stent and post stenting pneumobilia in the included  upper abdomen. Musculoskeletal: No chest wall abnormality. No acute or significant osseous findings. Review of the MIP images confirms  the above findings. IMPRESSION: 1. Negative examination for pulmonary embolism. 2. Severe centrilobular emphysema with diffuse bilateral bronchial wall thickening, consistent with nonspecific infectious or inflammatory bronchitis. 3. Coronary artery disease. 4. Partially imaged common bile duct stent and post stenting pneumobilia in the included upper abdomen, in keeping with patient history of pancreatic cancer. Aortic Atherosclerosis (ICD10-I70.0) and Emphysema (ICD10-J43.9). Electronically Signed   By: Eddie Candle M.D.   On: 11/28/2020 19:13   MR LIVER W WO CONTRAST  Result Date: 11/22/2020 CLINICAL DATA:  History of pancreatic cancer, liver lesions identified by prior CT, status post chemotherapy EXAM: MRI ABDOMEN WITHOUT AND WITH CONTRAST TECHNIQUE: Multiplanar multisequence MR imaging of the abdomen was performed both before and after the administration of intravenous contrast. CONTRAST:  21m GADAVIST GADOBUTROL 1 MMOL/ML IV SOLN COMPARISON:  CT abdomen pelvis, 11/15/2020, MR abdomen, 08/04/2020 FINDINGS: Lower chest: No acute findings. Hepatobiliary: Ill-defined hyperenhancing foci identified by prior CT correspond to transient, ill-defined foci of arterial hyperenhancement on MR without corresponding intrinsic signal abnormality, rim enhancement, or other characteristic features of metastases (series 14, image 47, 31). There are other subcentimeter foci of arterial hyperenhancement not appreciated by CT, for example in the peripheral liver dome, hepatic segment VII (series 14, image 17). At least two of these foci were present on prior MR examination dated 08/04/2020 and other arterially hyperenhancing lesions seen at that time are not present on current examination. There is an additional area of ill-defined, very subtle hyperenhancement of the inferior right lobe of the liver, hepatic segment VI (series 14, image 50). Common bile duct stent remains positioned with tip in the duodenum. Post stenting  pneumobilia. 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 No inflammatory changes, or other parenchymal abnormality identified. Mild, diffuse pancreatic ductal dilatation, measuring up to 0.6 cm. Spleen:  Within normal limits in size and appearance. Adrenals/Urinary Tract: No masses identified. No evidence of hydronephrosis. Stomach/Bowel: Visualized portions within the abdomen are unremarkable. Vascular/Lymphatic: Redemonstrated enlarged portacaval lymph nodes measuring up to 1.3 x 0.7 cm (series 14, image 46). Redemonstrated enlarged gastrohepatic ligament lymph node, measuring 0.9 x 0.6 cm (series 14, image 50). Redemonstrated infrarenal abdominal aortic aneurysm with a large burden of eccentric mural thrombus, measuring approximately 3.5 x 3.2 cm by MR (series 16, image 68). Other:  Trace ascites. Musculoskeletal: No suspicious bone lesions identified. IMPRESSION: 1. Ill-defined hyperenhancing foci identified by prior CT correspond to transient, ill-defined foci of arterial hyperenhancement without corresponding intrinsic signal abnormality, rim enhancement, or other characteristic features of metastases. There is an additional area of ill-defined, very subtle hyperenhancement of the inferior right lobe of the liver, hepatic segment VI. Some of these lesions were present on prior MR dated 08/04/2020, other lesions present at that time are no longer noted. Findings are most consistent with transient hepatic intensity differences, most likely related to parenchymal perfusion differences in the setting of portal vein occlusion. Metastatic disease is not favored, although attention is warranted on follow-up. 2. 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 3. Common bile duct stent remains in position. 4.  Redemonstrated enlarged portacaval and gastrohepatic ligament lymph nodes, suspicious for nodal metastatic disease as seen on prior CT.  5. Redemonstrated infrarenal abdominal aortic aneurysm with a large burden of eccentric mural thrombus, measuring approximately 3.5 x 3.2 cm by MR. 6. Trace ascites. Aortic Atherosclerosis (ICD10-I70.0). Electronically Signed   By: Eddie Candle M.D.   On: 11/22/2020 07:47   DG Chest Portable 1 View  Result Date: 11/28/2020 CLINICAL DATA:  Shortness of breath history of pancreatic carcinoma EXAM: PORTABLE CHEST 1 VIEW COMPARISON:  None. FINDINGS: Cardiac shadow is within normal limits. Aortic calcifications are seen. Right chest wall port is noted in satisfactory position. Lungs are well aerated without focal infiltrate. No acute bony abnormality is seen. IMPRESSION: No acute abnormality noted. Electronically Signed   By: Inez Catalina M.D.   On: 11/28/2020 16:31   CT PANCREAS ABD W/WO  Result Date: 11/15/2020 CLINICAL DATA:  Pancreatic cancer.  Restaging. EXAM: CT ABDOMEN WITHOUT AND WITH CONTRAST TECHNIQUE: Multidetector CT imaging of the abdomen was performed following the standard protocol before and following the bolus administration of intravenous contrast. CONTRAST:  48m OMNIPAQUE IOHEXOL 350 MG/ML SOLN COMPARISON:  MRI 08/04/2020.  CT scan 08/04/2020. FINDINGS: Lower chest: Emphysema. Hepatobiliary: Hypervascular lesion identified in the peripheral anterior right liver (image 15/series 4) measuring about 8 mm. Similar hypervascular lesion noted inferior right liver on 51/4 measuring about 7 mm. Neither of these was visualized on the previous MRI. Hypervascular right hepatic lesion seen on the previous MRIs not evident by CT today. Subtle subcapsular area of focal mild arterial phase hyperenhancement identified inferior right liver on 56/4, not seen on prior studies. Finally a tiny 5 mm focus of arterial phase hyperenhancement noted inferior tip right liver on 71/4.  Mild intra hepatic biliary duct dilatation is associated with pneumobilia. Common bile duct stent visualized in situ. Pancreas: Pancreatic head lesion measures 1.6 x 1.1 cm today which compares to 1.8 x 1.1 cm on previous CT when remeasured in a similar fashion. Diffuse pancreatic parenchymal atrophy noted with mild diffuse distention of the main pancreatic duct. Spleen: Spleen measures 10.7 x 10.6 x 7.8 cm (442 cc). Adrenals/Urinary Tract: No adrenal nodule or mass. Tiny hypodensity lower pole right kidney is too small to characterize but likely benign. Left kidney unremarkable. Stomach/Bowel: Stomach is nondistended Duodenum is normally positioned as is the ligament of Treitz. No small bowel or colonic dilatation within the visualized abdomen. Vascular/Lymphatic: Abdominal aortic aneurysm again noted measuring 3.5 x 3.4 cm. A collar of abnormal soft tissue abuts the celiac axis over a circumference of 180 degrees from 12:00 to 6:00 positions (see axial 43/series 18). Fat planes around the SMA appear preserved. Portal vein is markedly attenuated with only a string of patent lumen visible on coronal image 34 of series 14. The 11 mm portal caval node on 67/18 has increased from 5 mm previously. 7 mm short axis hepatoduodenal ligament lymph node on 56/18 was 4 mm previously. Small retroaortic lymph nodes are minimally bigger in the interval. Paraesophageal varices evident. Bilateral common iliac artery stents are again noted. Other: No intraperitoneal free fluid. Musculoskeletal: No worrisome lytic or sclerotic osseous abnormality. IMPRESSION: 1. No substantial interval change in the pancreatic head lesion with diffuse pancreatic parenchymal atrophy and mild diffuse distention of the main pancreatic duct. 2. Interval development of several tiny hypervascular lesions in the right liver, indeterminate but metastatic disease not excluded. 3. Progression of hepatoduodenal ligament and retroperitoneal lymphadenopathy,  concerning for metastatic disease. 4. Abnormal soft tissue abuts the celiac axis over a circumference of 180 degrees from 12:00 to 6:00 positions. Imaging features relatively stable  in the interval. 5. Fat planes around the SMA appear preserved. 6. Marked attenuation of the main portal vein with only a string of patent lumen visible. 7. Paraesophageal varices with borderline splenomegaly evident. 8. Abdominal aortic aneurysm measuring up to 3.5 cm, similar to prior. 9. Aortic Atherosclerosis (ICD10-I70.0). Electronically Signed   By: Misty Stanley M.D.   On: 11/15/2020 17:50     ASSESSMENT & PLAN:   Malignant neoplasm of head of pancreas [facility]) #Pancreatic adenocarcinoma/locally advanced/metastatic disease [question liver lesions; abdominal/retroperitoneal adenopathy-CT scan November 15, 2020].  Discontinued neoadjuvant chemotherapy.  Discussed the goal of therapy would be is to control the disease/palliative basis.   #Patient currently on gemcitabine-weekly along with radiation;[until October 11th].  However given poor tolerance to gemcitabine-given significant shortness of breath post infusion-we will discontinue gemcitabine.  Continue radiation.  Discussed that we will plan to get a CT scan approximately a month postradiation.  Can consider Xeloda/infusion of 5-FU liposomal irinotecan  #Worsening difficulty breathing post gemcitabine-question gemcitabine induced lung toxicity.  Chest x-ray negative/ COPD.   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  Start patient on Megace.  Discussed the potential concern for  Stable.  # Chronic Diarrhea/adominal pain - on norco at [facility].Marland Kitchen   #Recurrent biliary obstruction/elevated LFTs-s/p repeat ERCP [AUG 11th, Dr.Wohl]-stable  # DISPOSITION: de-access # NO chemo today # Follow up in 2 weeks- MD; labs- cbc/cmp;ca-19-9- NO chemo; possible iVFS Dr.B    All questions were  answered. The patient knows to call the clinic with any problems, questions or concerns.    Cammie Sickle, MD 12/12/2020 11:57 PM

## 2020-12-13 ENCOUNTER — Ambulatory Visit: Payer: Medicare HMO

## 2020-12-13 ENCOUNTER — Ambulatory Visit
Admission: RE | Admit: 2020-12-13 | Discharge: 2020-12-13 | Disposition: A | Discharge status: Home / Self Care | Payer: Medicare HMO | Source: Ambulatory Visit | Attending: Radiation Oncology | Admitting: Radiation Oncology

## 2020-12-13 DIAGNOSIS — Z510 Encounter for antineoplastic radiation therapy: Secondary | ICD-10-CM | POA: Diagnosis not present

## 2020-12-14 ENCOUNTER — Ambulatory Visit
Admission: RE | Admit: 2020-12-14 | Discharge: 2020-12-14 | Disposition: A | Discharge status: Home / Self Care | Payer: Medicare HMO | Source: Ambulatory Visit | Attending: Radiation Oncology | Admitting: Radiation Oncology

## 2020-12-14 DIAGNOSIS — Z510 Encounter for antineoplastic radiation therapy: Secondary | ICD-10-CM | POA: Diagnosis not present

## 2020-12-15 ENCOUNTER — Inpatient Hospital Stay (HOSPITAL_BASED_OUTPATIENT_CLINIC_OR_DEPARTMENT_OTHER): Payer: Medicare HMO | Admitting: Hospice and Palliative Medicine

## 2020-12-15 ENCOUNTER — Other Ambulatory Visit: Payer: Self-pay

## 2020-12-15 ENCOUNTER — Ambulatory Visit
Admission: RE | Admit: 2020-12-15 | Discharge: 2020-12-15 | Disposition: A | Discharge status: Home / Self Care | Payer: Medicare HMO | Source: Ambulatory Visit | Attending: Radiation Oncology | Admitting: Radiation Oncology

## 2020-12-15 ENCOUNTER — Inpatient Hospital Stay: Payer: Medicare HMO

## 2020-12-15 VITALS — BP 134/88 | HR 109 | Temp 99.0°F | Resp 20

## 2020-12-15 VITALS — BP 134/88 | HR 109 | Temp 99.5°F | Resp 20

## 2020-12-15 DIAGNOSIS — R000 Tachycardia, unspecified: Secondary | ICD-10-CM | POA: Diagnosis not present

## 2020-12-15 DIAGNOSIS — F419 Anxiety disorder, unspecified: Secondary | ICD-10-CM

## 2020-12-15 DIAGNOSIS — Z510 Encounter for antineoplastic radiation therapy: Secondary | ICD-10-CM | POA: Diagnosis not present

## 2020-12-15 DIAGNOSIS — C250 Malignant neoplasm of head of pancreas: Secondary | ICD-10-CM

## 2020-12-15 DIAGNOSIS — Z95828 Presence of other vascular implants and grafts: Secondary | ICD-10-CM

## 2020-12-15 DIAGNOSIS — Z5111 Encounter for antineoplastic chemotherapy: Secondary | ICD-10-CM | POA: Diagnosis not present

## 2020-12-15 LAB — CBC WITH DIFFERENTIAL/PLATELET
Abs Immature Granulocytes: 0.07 10*3/uL (ref 0.00–0.07)
Basophils Absolute: 0 10*3/uL (ref 0.0–0.1)
Basophils Relative: 0 %
Eosinophils Absolute: 0.1 10*3/uL (ref 0.0–0.5)
Eosinophils Relative: 1 %
HCT: 38.9 % (ref 36.0–46.0)
Hemoglobin: 12.5 g/dL (ref 12.0–15.0)
Immature Granulocytes: 1 %
Lymphocytes Relative: 14 %
Lymphs Abs: 1.1 10*3/uL (ref 0.7–4.0)
MCH: 30.1 pg (ref 26.0–34.0)
MCHC: 32.1 g/dL (ref 30.0–36.0)
MCV: 93.7 fL (ref 80.0–100.0)
Monocytes Absolute: 0.9 10*3/uL (ref 0.1–1.0)
Monocytes Relative: 12 %
Neutro Abs: 5.9 10*3/uL (ref 1.7–7.7)
Neutrophils Relative %: 72 %
Platelets: 167 10*3/uL (ref 150–400)
RBC: 4.15 MIL/uL (ref 3.87–5.11)
RDW: 17.8 % — ABNORMAL HIGH (ref 11.5–15.5)
WBC: 8.1 10*3/uL (ref 4.0–10.5)
nRBC: 0 % (ref 0.0–0.2)

## 2020-12-15 LAB — COMPREHENSIVE METABOLIC PANEL
ALT: 19 U/L (ref 0–44)
AST: 22 U/L (ref 15–41)
Albumin: 3.7 g/dL (ref 3.5–5.0)
Alkaline Phosphatase: 65 U/L (ref 38–126)
Anion gap: 7 (ref 5–15)
BUN: 11 mg/dL (ref 8–23)
CO2: 25 mmol/L (ref 22–32)
Calcium: 9 mg/dL (ref 8.9–10.3)
Chloride: 104 mmol/L (ref 98–111)
Creatinine, Ser: 0.55 mg/dL (ref 0.44–1.00)
GFR, Estimated: >60 mL/min (ref >60)
Glucose, Bld: 87 mg/dL (ref 70–99)
Potassium: 3.9 mmol/L (ref 3.5–5.1)
Sodium: 136 mmol/L (ref 135–145)
Total Bilirubin: 0.9 mg/dL (ref 0.3–1.2)
Total Protein: 7.2 g/dL (ref 6.5–8.1)

## 2020-12-15 MED ORDER — HEPARIN SOD (PORK) LOCK FLUSH 100 UNIT/ML IV SOLN
500.0000 [IU] | Freq: Once | INTRAVENOUS | Status: AC
  Administered 2020-12-15: 500 [IU] via INTRAVENOUS
  Filled 2020-12-15: qty 5

## 2020-12-15 MED ORDER — LEVALBUTEROL TARTRATE 45 MCG/ACT IN AERO: 1.0000 | INHALATION_SPRAY | Freq: Four times a day (QID) | RESPIRATORY_TRACT | 12 refills | Status: AC | PRN

## 2020-12-15 MED ORDER — ALPRAZOLAM 0.25 MG PO TABS
0.2500 mg | ORAL_TABLET | Freq: Three times a day (TID) | ORAL | 0 refills | Status: DC | PRN
Start: 2020-12-15 — End: 2021-02-13

## 2020-12-15 NOTE — Progress Notes (Signed)
Symptom Management [place]  [phone] [phone]  Patient Care Team: Erma Heritage, MD as PCP - General (Family Medicine) Rico Junker, RN as Registered Nurse Theodore Demark, RN as Registered Nurse Adron Bene as Physician Assistant (Gastroenterology) Bary Castilla Forest Gleason, MD (General Surgery) Clent Jacks, RN as Oncology Nurse Navigator Cammie Sickle, MD as Consulting Physician (Hematology and Oncology)   Name of the patient: Shirley Barton  KH:[id number]  02-12-1951   Date of visit: 12/15/20  Reason for Consult: Ms. Melondy Tiffin is a 70 year old woman with multiple medical problems including stage I pancreatic adenocarcinoma previously treated with FOLFIRINOX but she had extremely poor tolerance.  Patient was started on gemcitabine/Abraxane.    Patient was hospitalized 11/28/2020 to 11/29/2020 with hypoxia and shortness of breath following Gemzar treatment.  Symptoms improved with duo nebs and were thought secondary to mild COPD exacerbation.  CTA of the chest on 11/28/2020 did not reveal evidence of PE.  Patient is noted to have severe centrilobular emphysema with diffuse bilateral bronchial wall thickening.  Patient again received Gemzar on 12/06/2020 and reported recurrence of shortness of breath.  She was seen in [facility] for evaluation for same on 12/08/2020.  Chest x-ray was unrevealing.  Patient was started on prednisone taper for possible COPD component  Patient saw Dr. Rogue Bussing on 12/12/2020 and gemcitabine was discontinued.  Plan was to repeat CT after completion of XRT with possible consideration of future chemotherapy.  Patient presents to [facility] today for complaint of heart palpitations.  She says that she feels like her heart is racing at times.  She associates this with using her nebulizer/albuterol inhaler.  She continues to have chronic shortness of breath and also endorses anxiety.    Denies any  neurologic complaints. Denies recent fevers or illnesses. Denies any easy bleeding or bruising. Reports improved appetite and denies weight loss. Denies chest pain. Denies any nausea, vomiting, constipation, or diarrhea. Denies urinary complaints. Patient offers no further specific complaints today.  PAST MEDICAL HISTORY: Past Medical History:  Diagnosis Date   Abdominal pain, generalized    Anxiety    Atypical chest pain    Back pain    Basal cell carcinoma of skin 2013   resected from Left scalp area.    COPD (chronic obstructive pulmonary disease) (HCC)    Depression    DOE (dyspnea on exertion)    GERD (gastroesophageal reflux disease)    Hypertension    Pancreatic cancer ([place])    Shortness of breath dyspnea     PAST SURGICAL HISTORY:  Past Surgical History:  Procedure Laterality Date   BASAL CELL CARCINOMA EXCISION  2013   CARPAL TUNNEL RELEASE Left 90s   CHOLECYSTECTOMY N/A 09/16/2015   Procedure: LAPAROSCOPIC CHOLECYSTECTOMY WITH INTRAOPERATIVE CHOLANGIOGRAM;  Surgeon: Robert Bellow, MD;  Location: [facility] ORS;  Service: General;  Laterality: N/A;   COLONOSCOPY  1990"s   [place]   ENDOSCOPIC RETROGRADE CHOLANGIOPANCREATOGRAPHY (ERCP) WITH PROPOFOL N/A 11/10/2020   Procedure: ENDOSCOPIC RETROGRADE CHOLANGIOPANCREATOGRAPHY (ERCP) WITH PROPOFOL;  Surgeon: Lucilla Lame, MD;  Location: [facility] ENDOSCOPY;  Service: Endoscopy;  Laterality: N/A;   ERCP N/A 08/05/2020   Procedure: ENDOSCOPIC RETROGRADE CHOLANGIOPANCREATOGRAPHY (ERCP);  Surgeon: Lucilla Lame, MD;  Location: [facility] ENDOSCOPY;  Service: Endoscopy;  Laterality: N/A;   ESOPHAGOGASTRODUODENOSCOPY (EGD) WITH PROPOFOL N/A 02/05/2017   Procedure: ESOPHAGOGASTRODUODENOSCOPY (EGD) WITH PROPOFOL;  Surgeon: Lollie Sails, MD;  Location: [facility] ENDOSCOPY;  Service: Endoscopy;  Laterality: N/A;   PERIPHERAL  VASCULAR CATHETERIZATION Bilateral 12/27/2015   Procedure: Lower Extremity Angiography;  Surgeon: Katha Cabal, MD;   Location: [place] CV LAB;  Service: Cardiovascular;  Laterality: Bilateral;   PORTA CATH INSERTION N/A 09/08/2020   Procedure: PORTA CATH INSERTION;  Surgeon: Algernon Huxley, MD;  Location: [place] CV LAB;  Service: Cardiovascular;  Laterality: N/A;   TONSILLECTOMY      HEMATOLOGY/ONCOLOGY HISTORY:  Oncology History Overview Note  # MAY 2022-pancreatic adenocarcinoma [obstructive jaundice-]MRI-18 mm pancreatic head mass; EUS [[facility]; Dr.Spaete]-abutment of portal vein/SMV; no lymphadenopathy noted; May 5 CA 19-9-163; s/p stenting -May 25th-47.   #Obstructive jaundice-s/p ERCP stenting [Dr.Wohl]  MAY 2022- ENDOSONOGRAPHIC FINDING: : A round mass was identified in the pancreatic head.  The mass was hypoechoic. The mass measured 16 mm by 17  mm in maximal cross-sectional diameter. The outer  margins were irregular. There was sonographic evidence  suggesting invasion into the portal vein (manifested  by abutment) and the superior mesenteric vein  (manifested by abutment).   #Pancreatic adenocarcinoma pT1 however on EUS/CT [[facility]. Dr.Shah]-concerning for tumor abutting the celiac axis /common hepatic artery /portal vein/superior mesenteric vein; without any metastatic disease.  Borderline resectable Elevated CA 19-9.  [S/p evaluation at [place]  # June 9th- FOLFIRINOX cycle #1; discontinued because of poor GI tolerance  # &/01-2021-gemcitabine Abraxane ['100mg'$ /2] day 1 day 815  # July 11th-gemcitabine and Abraxane day.  November 15, 2020-CT scan progressive disease/poor tolerance to therapy  # AUG 29th, 2022-gemcitabine single agent weekly-concurrent radiation;  December 12, 2020-discontinued gemcitabine single agent because of significant respiratory distress post gemcitabine infusion.  Continue local radiation ---------------------------------------------------------      Malignant neoplasm of head of pancreas ([place])  08/24/2020 Initial Diagnosis   Malignant neoplasm  of head of pancreas ([place])   09/09/2020 - 09/12/2020 Chemotherapy         10/10/2020 -  Chemotherapy    Patient is on Treatment Plan: PANCREATIC ABRAXANE / GEMCITABINE D1,8,15 Q28D        ALLERGIES:  is allergic to tramadol and penicillins.  MEDICATIONS:  Current Outpatient Medications  Medication Sig Dispense Refill   acetaminophen (TYLENOL) 325 MG tablet Take 650 mg by mouth every 6 (six) hours as needed for mild pain or moderate pain.     albuterol (VENTOLIN HFA) 108 (90 Base) MCG/ACT inhaler Inhale 2 puffs into the lungs every 6 (six) hours as needed for wheezing or shortness of breath. 8 g 2   B Complex-C-Folic Acid (B COMPLEX-VITAMIN C-FOLIC ACID) 1 MG tablet Take 1 tablet by mouth daily with breakfast. 30 tablet 0   Cetirizine HCl (ZYRTEC ALLERGY PO) Take 1 tablet by mouth daily as needed (allergies).     diphenoxylate-atropine (LOMOTIL) 2.5-0.025 MG tablet Take 1 tablet by mouth 4 (four) times daily as needed for diarrhea or loose stools. 60 tablet 1   Fluticasone-Salmeterol (ADVAIR) 100-50 MCG/DOSE AEPB Inhale 1 puff into the lungs 2 (two) times daily.     HYDROcodone-acetaminophen (NORCO) 5-325 MG tablet Take 1 tablet by mouth every 6 (six) hours as needed for moderate pain. 45 tablet 0   ipratropium-albuterol (DUONEB) 0.5-2.5 (3) MG/3ML SOLN Take 3 mLs by nebulization every 4 (four) hours as needed. 360 mL 0   KLOR-CON M20 20 MEQ tablet TAKE 1 TABLET BY MOUTH TWICE A DAY 60 tablet 0   lidocaine-prilocaine (EMLA) cream Apply 30 -45 mins prior to port access. 30 g 0   magic mouthwash w/lidocaine SOLN Take 10 mLs by mouth every  2 (two) hours as needed for mouth pain. 360 mL 0   megestrol (MEGACE ES) 625 MG/5ML suspension Take 5 mLs (625 mg total) by mouth daily. 150 mL 0   ondansetron (ZOFRAN) 8 MG tablet One pill every 8 hours as needed for nausea/vomitting. 40 tablet 3   pantoprazole (PROTONIX) 40 MG tablet Take 1 tablet (40 mg total) by mouth daily. 90 tablet 0   predniSONE  (DELTASONE) 10 MG tablet Take 4 tablets ('40mg'$ ) x 2 days, then take 3 tablets ('30mg'$ ) x 2 days, then take 2 tablets ('20mg'$ ) x 2 days, then take 1 tablet ('10mg'$ ) x 2 days, then stop 20 tablet 0   prochlorperazine (COMPAZINE) 10 MG tablet Take 1 tablet (10 mg total) by mouth every 6 (six) hours as needed for nausea or vomiting. 40 tablet 3   No current facility-administered medications for this visit.    VITAL SIGNS: BP 134/88   Pulse (!) 109   Temp 99 F (37.2 C) (Tympanic)   Resp 20   LMP 01/03/1995 (Approximate)   SpO2 100%  There were no vitals filed for this visit.   Estimated body mass index is 16.58 kg/m as calculated from the following:   Height as of 12/12/20: '5\' 4"'$  (1.626 m).   Weight as of 12/12/20: 96 lb 9.6 oz (43.8 kg).  LABS: CBC:    Component Value Date/Time   WBC 8.1 12/15/2020 1003   HGB 12.5 12/15/2020 1003   HGB 14.0 08/10/2020 1010   HCT 38.9 12/15/2020 1003   HCT 42.1 08/10/2020 1010   PLT 167 12/15/2020 1003   PLT 256 08/10/2020 1010   MCV 93.7 12/15/2020 1003   MCV 91 08/10/2020 1010   NEUTROABS 5.9 12/15/2020 1003   NEUTROABS 4.9 08/10/2020 1010   LYMPHSABS 1.1 12/15/2020 1003   LYMPHSABS 1.6 08/10/2020 1010   MONOABS 0.9 12/15/2020 1003   EOSABS 0.1 12/15/2020 1003   EOSABS 0.2 08/10/2020 1010   BASOSABS 0.0 12/15/2020 1003   BASOSABS 0.1 08/10/2020 1010   Comprehensive Metabolic Panel:    Component Value Date/Time   NA 136 12/15/2020 1003   NA 141 10/06/2015 1406   K 3.9 12/15/2020 1003   CL 104 12/15/2020 1003   CO2 25 12/15/2020 1003   BUN 11 12/15/2020 1003   BUN 7 (L) 10/06/2015 1406   CREATININE 0.55 12/15/2020 1003   GLUCOSE 87 12/15/2020 1003   CALCIUM 9.0 12/15/2020 1003   AST 22 12/15/2020 1003   ALT 19 12/15/2020 1003   ALKPHOS 65 12/15/2020 1003   BILITOT 0.9 12/15/2020 1003   BILITOT 0.5 10/06/2015 1406   PROT 7.2 12/15/2020 1003   PROT 7.5 10/06/2015 1406   ALBUMIN 3.7 12/15/2020 1003   ALBUMIN 4.7 10/06/2015 1406     RADIOGRAPHIC STUDIES: DG Chest 2 View  Result Date: 12/08/2020 CLINICAL DATA:  Shortness of breath following chemotherapy 2 weeks ago. History of pancreatic cancer. EXAM: CHEST - 2 VIEW COMPARISON:  Chest x-ray 11/28/2020 FINDINGS: The right IJ power port is stable. The cardiac silhouette, mediastinal and hilar contours are normal. Stable emphysematous changes with fairly marked hyperinflation but no acute overlying pulmonary process. No pulmonary lesions. No pleural effusions. No pneumothorax. The bony thorax is intact. IMPRESSION: Emphysematous changes but no acute pulmonary findings. Electronically Signed   By: Marijo Sanes M.D.   On: 12/08/2020 12:25   CT Angio Chest PE W and/or Wo Contrast  Result Date: 11/28/2020 CLINICAL DATA:  Pancreatic cancer, COPD, shortness of breath, tachycardia, evaluate  for PE EXAM: CT ANGIOGRAPHY CHEST WITH CONTRAST TECHNIQUE: Multidetector CT imaging of the chest was performed using the standard protocol during bolus administration of intravenous contrast. Multiplanar CT image reconstructions and MIPs were obtained to evaluate the vascular anatomy. CONTRAST:  64m OMNIPAQUE IOHEXOL 350 MG/ML SOLN COMPARISON:  None. FINDINGS: Cardiovascular: Satisfactory opacification of the pulmonary arteries to the segmental level. No evidence of pulmonary embolism. Normal heart size. Three-vessel coronary artery calcifications. No pericardial effusion. Aortic atherosclerosis Mediastinum/Nodes: No enlarged mediastinal, hilar, or axillary lymph nodes. Small hiatal hernia. Thyroid gland, trachea, and esophagus demonstrate no significant findings. Lungs/Pleura: Severe centrilobular emphysema. Diffuse bilateral bronchial wall thickening. No pleural effusion or pneumothorax. Upper Abdomen: No acute abnormality. Partially imaged common bile duct stent and post stenting pneumobilia in the included upper abdomen. Musculoskeletal: No chest wall abnormality. No acute or significant osseous  findings. Review of the MIP images confirms the above findings. IMPRESSION: 1. Negative examination for pulmonary embolism. 2. Severe centrilobular emphysema with diffuse bilateral bronchial wall thickening, consistent with nonspecific infectious or inflammatory bronchitis. 3. Coronary artery disease. 4. Partially imaged common bile duct stent and post stenting pneumobilia in the included upper abdomen, in keeping with patient history of pancreatic cancer. Aortic Atherosclerosis (ICD10-I70.0) and Emphysema (ICD10-J43.9). Electronically Signed   By: AEddie CandleM.D.   On: 11/28/2020 19:13   MR LIVER W WO CONTRAST  Result Date: 11/22/2020 CLINICAL DATA:  History of pancreatic cancer, liver lesions identified by prior CT, status post chemotherapy EXAM: MRI ABDOMEN WITHOUT AND WITH CONTRAST TECHNIQUE: Multiplanar multisequence MR imaging of the abdomen was performed both before and after the administration of intravenous contrast. CONTRAST:  483mGADAVIST GADOBUTROL 1 MMOL/ML IV SOLN COMPARISON:  CT abdomen pelvis, 11/15/2020, MR abdomen, 08/04/2020 FINDINGS: Lower chest: No acute findings. Hepatobiliary: Ill-defined hyperenhancing foci identified by prior CT correspond to transient, ill-defined foci of arterial hyperenhancement on MR without corresponding intrinsic signal abnormality, rim enhancement, or other characteristic features of metastases (series 14, image 47, 31). There are other subcentimeter foci of arterial hyperenhancement not appreciated by CT, for example in the peripheral liver dome, hepatic segment VII (series 14, image 17). At least two of these foci were present on prior MR examination dated 08/04/2020 and other arterially hyperenhancing lesions seen at that time are not present on current examination. There is an additional area of ill-defined, very subtle hyperenhancement of the inferior right lobe of the liver, hepatic segment VI (series 14, image 50). Common bile duct stent remains  positioned with tip in the duodenum. Post stenting pneumobilia. 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 No inflammatory changes, or other parenchymal abnormality identified. Mild, diffuse pancreatic ductal dilatation, measuring up to 0.6 cm. Spleen:  Within normal limits in size and appearance. Adrenals/Urinary Tract: No masses identified. No evidence of hydronephrosis. Stomach/Bowel: Visualized portions within the abdomen are unremarkable. Vascular/Lymphatic: Redemonstrated enlarged portacaval lymph nodes measuring up to 1.3 x 0.7 cm (series 14, image 46). Redemonstrated enlarged gastrohepatic ligament lymph node, measuring 0.9 x 0.6 cm (series 14, image 50). Redemonstrated infrarenal abdominal aortic aneurysm with a large burden of eccentric mural thrombus, measuring approximately 3.5 x 3.2 cm by MR (series 16, image 68). Other:  Trace ascites. Musculoskeletal: No suspicious bone lesions identified. IMPRESSION: 1. Ill-defined hyperenhancing foci identified by prior CT correspond to transient, ill-defined foci of arterial hyperenhancement without corresponding intrinsic signal abnormality, rim enhancement, or  other characteristic features of metastases. There is an additional area of ill-defined, very subtle hyperenhancement of the inferior right lobe of the liver, hepatic segment VI. Some of these lesions were present on prior MR dated 08/04/2020, other lesions present at that time are no longer noted. Findings are most consistent with transient hepatic intensity differences, most likely related to parenchymal perfusion differences in the setting of portal vein occlusion. Metastatic disease is not favored, although attention is warranted on follow-up. 2. Known pancreatic head mass is poorly appreciated by MR; primary evidence of mass is occlusion of the central portion of the portal  vein. 3. Common bile duct stent remains in position. 4. Redemonstrated enlarged portacaval and gastrohepatic ligament lymph nodes, suspicious for nodal metastatic disease as seen on prior CT. 5. Redemonstrated infrarenal abdominal aortic aneurysm with a large burden of eccentric mural thrombus, measuring approximately 3.5 x 3.2 cm by MR. 6. Trace ascites. Aortic Atherosclerosis (ICD10-I70.0). Electronically Signed   By: Eddie Candle M.D.   On: 11/22/2020 07:47   DG Chest Portable 1 View  Result Date: 11/28/2020 CLINICAL DATA:  Shortness of breath history of pancreatic carcinoma EXAM: PORTABLE CHEST 1 VIEW COMPARISON:  None. FINDINGS: Cardiac shadow is within normal limits. Aortic calcifications are seen. Right chest wall port is noted in satisfactory position. Lungs are well aerated without focal infiltrate. No acute bony abnormality is seen. IMPRESSION: No acute abnormality noted. Electronically Signed   By: Inez Catalina M.D.   On: 11/28/2020 16:31   CT PANCREAS ABD W/WO  Result Date: 11/15/2020 CLINICAL DATA:  Pancreatic cancer.  Restaging. EXAM: CT ABDOMEN WITHOUT AND WITH CONTRAST TECHNIQUE: Multidetector CT imaging of the abdomen was performed following the standard protocol before and following the bolus administration of intravenous contrast. CONTRAST:  46m OMNIPAQUE IOHEXOL 350 MG/ML SOLN COMPARISON:  MRI 08/04/2020.  CT scan 08/04/2020. FINDINGS: Lower chest: Emphysema. Hepatobiliary: Hypervascular lesion identified in the peripheral anterior right liver (image 15/series 4) measuring about 8 mm. Similar hypervascular lesion noted inferior right liver on 51/4 measuring about 7 mm. Neither of these was visualized on the previous MRI. Hypervascular right hepatic lesion seen on the previous MRIs not evident by CT today. Subtle subcapsular area of focal mild arterial phase hyperenhancement identified inferior right liver on 56/4, not seen on prior studies. Finally a tiny 5 mm focus of arterial phase  hyperenhancement noted inferior tip right liver on 71/4. Mild intra hepatic biliary duct dilatation is associated with pneumobilia. Common bile duct stent visualized in situ. Pancreas: Pancreatic head lesion measures 1.6 x 1.1 cm today which compares to 1.8 x 1.1 cm on previous CT when remeasured in a similar fashion. Diffuse pancreatic parenchymal atrophy noted with mild diffuse distention of the main pancreatic duct. Spleen: Spleen measures 10.7 x 10.6 x 7.8 cm (442 cc). Adrenals/Urinary Tract: No adrenal nodule or mass. Tiny hypodensity lower pole right kidney is too small to characterize but likely benign. Left kidney unremarkable. Stomach/Bowel: Stomach is nondistended Duodenum is normally positioned as is the ligament of Treitz. No small bowel or colonic dilatation within the visualized abdomen. Vascular/Lymphatic: Abdominal aortic aneurysm again noted measuring 3.5 x 3.4 cm. A collar of abnormal soft tissue abuts the celiac axis over a circumference of 180 degrees from 12:00 to 6:00 positions (see axial 43/series 18). Fat planes around the SMA appear preserved. Portal vein is markedly attenuated with only a string of patent lumen visible on coronal image 34 of series 14. The 11 mm portal caval node on 67/18  has increased from 5 mm previously. 7 mm short axis hepatoduodenal ligament lymph node on 56/18 was 4 mm previously. Small retroaortic lymph nodes are minimally bigger in the interval. Paraesophageal varices evident. Bilateral common iliac artery stents are again noted. Other: No intraperitoneal free fluid. Musculoskeletal: No worrisome lytic or sclerotic osseous abnormality. IMPRESSION: 1. No substantial interval change in the pancreatic head lesion with diffuse pancreatic parenchymal atrophy and mild diffuse distention of the main pancreatic duct. 2. Interval development of several tiny hypervascular lesions in the right liver, indeterminate but metastatic disease not excluded. 3. Progression of  hepatoduodenal ligament and retroperitoneal lymphadenopathy, concerning for metastatic disease. 4. Abnormal soft tissue abuts the celiac axis over a circumference of 180 degrees from 12:00 to 6:00 positions. Imaging features relatively stable in the interval. 5. Fat planes around the SMA appear preserved. 6. Marked attenuation of the main portal vein with only a string of patent lumen visible. 7. Paraesophageal varices with borderline splenomegaly evident. 8. Abdominal aortic aneurysm measuring up to 3.5 cm, similar to prior. 9. Aortic Atherosclerosis (ICD10-I70.0). Electronically Signed   By: Misty Stanley M.D.   On: 11/15/2020 17:50    PERFORMANCE STATUS (ECOG) : 1 - Symptomatic but completely ambulatory  Review of Systems Unless otherwise noted, a complete review of systems is negative.  Physical Exam General: NAD Cardiovascular: regular rate and rhythm Pulmonary: clear anterior/posterior fields Abdomen: soft, nontender, + bowel sounds GU: no suprapubic tenderness Extremities: no edema, no joint deformities Skin: no rashes Neurological: Weakness but otherwise nonfocal  Assessment and Plan- Patient is a 69 y.o. female COPD and stage I pancreatic cancer on XRT but with poor tolerance of gemcitabine, who presents to [facility] today for evaluation of heart palpitations    Heart palpitations-patient is not hypoxic and is nontoxic-appearing.  EKG revealed sinus tachycardia with heart rate of 101.  TSH was recently normal.  Labs are essentially WNL today.  Vitals are stable in clinic.  Suspect that patient may be having tachycardia associated with albuterol.  Low suspicion for PE or cardiovascular event.  Discussed with Dr. Rogue Bussing and will plan to rotate from albuterol to Xopenex.  We will also start patient on low-dose alprazolam as she readily endorses anxiety.  She is not interested in an antidepressant.  Case and plan discussed with Dr. Rogue Bussing  Patient expressed understanding and was in  agreement with this plan. She also understands that She can call clinic at any time with any questions, concerns, or complaints.   Thank you for allowing me to participate in the care of this very pleasant patient.   Time Total: 20 minutes  Visit consisted of counseling and education dealing with the complex and emotionally intense issues of symptom management and palliative care in the setting of serious and potentially life-threatening illness.Greater than 50%  of this time was spent counseling and coordinating care related to the above assessment and plan.  Signed by: Altha Harm, PhD, NP-C

## 2020-12-15 NOTE — Progress Notes (Signed)
Pt presents to [facility] for complaints of rapid heart beat, elevated blood pressure, and "just not feeling right". She states that when she checks her BP at home, it has been as high as [id number] systolic, and pulse has been 130's-140's. Denies chest pain.

## 2020-12-16 ENCOUNTER — Ambulatory Visit
Admission: RE | Admit: 2020-12-16 | Discharge: 2020-12-16 | Disposition: A | Discharge status: Home / Self Care | Payer: Medicare HMO | Source: Ambulatory Visit | Attending: Radiation Oncology | Admitting: Radiation Oncology

## 2020-12-16 DIAGNOSIS — Z510 Encounter for antineoplastic radiation therapy: Secondary | ICD-10-CM | POA: Diagnosis not present

## 2020-12-19 ENCOUNTER — Ambulatory Visit
Admission: RE | Admit: 2020-12-19 | Discharge: 2020-12-19 | Disposition: A | Discharge status: Home / Self Care | Payer: Medicare HMO | Source: Ambulatory Visit | Attending: Radiation Oncology | Admitting: Radiation Oncology

## 2020-12-19 DIAGNOSIS — Z510 Encounter for antineoplastic radiation therapy: Secondary | ICD-10-CM | POA: Diagnosis not present

## 2020-12-20 ENCOUNTER — Ambulatory Visit
Admission: RE | Admit: 2020-12-20 | Discharge: 2020-12-20 | Disposition: A | Discharge status: Home / Self Care | Payer: Medicare HMO | Source: Ambulatory Visit | Attending: Radiation Oncology | Admitting: Radiation Oncology

## 2020-12-20 DIAGNOSIS — Z510 Encounter for antineoplastic radiation therapy: Secondary | ICD-10-CM | POA: Diagnosis not present

## 2020-12-21 ENCOUNTER — Ambulatory Visit
Admission: RE | Admit: 2020-12-21 | Discharge: 2020-12-21 | Disposition: A | Discharge status: Home / Self Care | Payer: Medicare HMO | Source: Ambulatory Visit | Attending: Radiation Oncology | Admitting: Radiation Oncology

## 2020-12-21 DIAGNOSIS — Z510 Encounter for antineoplastic radiation therapy: Secondary | ICD-10-CM | POA: Diagnosis not present

## 2020-12-22 ENCOUNTER — Ambulatory Visit
Admission: RE | Admit: 2020-12-22 | Discharge: 2020-12-22 | Disposition: A | Discharge status: Home / Self Care | Payer: Medicare HMO | Source: Ambulatory Visit | Attending: Radiation Oncology | Admitting: Radiation Oncology

## 2020-12-22 DIAGNOSIS — Z510 Encounter for antineoplastic radiation therapy: Secondary | ICD-10-CM | POA: Diagnosis not present

## 2020-12-23 ENCOUNTER — Encounter: Payer: Self-pay | Admitting: Internal Medicine

## 2020-12-23 ENCOUNTER — Ambulatory Visit
Admission: RE | Admit: 2020-12-23 | Discharge: 2020-12-23 | Disposition: A | Discharge status: Home / Self Care | Payer: Medicare HMO | Source: Ambulatory Visit | Attending: Radiation Oncology | Admitting: Radiation Oncology

## 2020-12-23 DIAGNOSIS — Z510 Encounter for antineoplastic radiation therapy: Secondary | ICD-10-CM | POA: Diagnosis not present

## 2020-12-26 ENCOUNTER — Inpatient Hospital Stay: Payer: Medicare HMO

## 2020-12-26 ENCOUNTER — Inpatient Hospital Stay (HOSPITAL_BASED_OUTPATIENT_CLINIC_OR_DEPARTMENT_OTHER): Payer: Medicare HMO | Admitting: Internal Medicine

## 2020-12-26 ENCOUNTER — Ambulatory Visit
Admission: RE | Admit: 2020-12-26 | Discharge: 2020-12-26 | Disposition: A | Discharge status: Home / Self Care | Payer: Medicare HMO | Source: Ambulatory Visit | Attending: Radiation Oncology | Admitting: Radiation Oncology

## 2020-12-26 DIAGNOSIS — C250 Malignant neoplasm of head of pancreas: Secondary | ICD-10-CM

## 2020-12-26 DIAGNOSIS — Z5111 Encounter for antineoplastic chemotherapy: Secondary | ICD-10-CM | POA: Diagnosis not present

## 2020-12-26 DIAGNOSIS — Z510 Encounter for antineoplastic radiation therapy: Secondary | ICD-10-CM | POA: Diagnosis not present

## 2020-12-26 LAB — COMPREHENSIVE METABOLIC PANEL
ALT: 19 U/L (ref 0–44)
AST: 27 U/L (ref 15–41)
Albumin: 3.7 g/dL (ref 3.5–5.0)
Alkaline Phosphatase: 64 U/L (ref 38–126)
Anion gap: 7 (ref 5–15)
BUN: 5 mg/dL — ABNORMAL LOW (ref 8–23)
CO2: 26 mmol/L (ref 22–32)
Calcium: 8.8 mg/dL — ABNORMAL LOW (ref 8.9–10.3)
Chloride: 102 mmol/L (ref 98–111)
Creatinine, Ser: 0.48 mg/dL (ref 0.44–1.00)
GFR, Estimated: >60 mL/min (ref >60)
Glucose, Bld: 86 mg/dL (ref 70–99)
Potassium: 3.5 mmol/L (ref 3.5–5.1)
Sodium: 135 mmol/L (ref 135–145)
Total Bilirubin: 0.7 mg/dL (ref 0.3–1.2)
Total Protein: 7.2 g/dL (ref 6.5–8.1)

## 2020-12-26 LAB — CBC WITH DIFFERENTIAL/PLATELET
Abs Immature Granulocytes: 0.02 10*3/uL (ref 0.00–0.07)
Basophils Absolute: 0 10*3/uL (ref 0.0–0.1)
Basophils Relative: 1 %
Eosinophils Absolute: 0 10*3/uL (ref 0.0–0.5)
Eosinophils Relative: 1 %
HCT: 38.4 % (ref 36.0–46.0)
Hemoglobin: 12.2 g/dL (ref 12.0–15.0)
Immature Granulocytes: 0 %
Lymphocytes Relative: 18 %
Lymphs Abs: 1.1 10*3/uL (ref 0.7–4.0)
MCH: 30.3 pg (ref 26.0–34.0)
MCHC: 31.8 g/dL (ref 30.0–36.0)
MCV: 95.5 fL (ref 80.0–100.0)
Monocytes Absolute: 0.6 10*3/uL (ref 0.1–1.0)
Monocytes Relative: 11 %
Neutro Abs: 4 10*3/uL (ref 1.7–7.7)
Neutrophils Relative %: 69 %
Platelets: 240 10*3/uL (ref 150–400)
RBC: 4.02 MIL/uL (ref 3.87–5.11)
RDW: 15.9 % — ABNORMAL HIGH (ref 11.5–15.5)
WBC: 5.8 10*3/uL (ref 4.0–10.5)
nRBC: 0 % (ref 0.0–0.2)

## 2020-12-26 MED ORDER — HYDROCODONE-ACETAMINOPHEN 5-325 MG PO TABS: 1.0000 | ORAL_TABLET | Freq: Two times a day (BID) | ORAL | 0 refills | Status: DC | PRN

## 2020-12-26 MED ORDER — SODIUM CHLORIDE 0.9% FLUSH
10.0000 mL | INTRAVENOUS | Status: DC | PRN
  Administered 2020-12-26: 10 mL via INTRAVENOUS
  Filled 2020-12-26: qty 10

## 2020-12-26 MED ORDER — HEPARIN SOD (PORK) LOCK FLUSH 100 UNIT/ML IV SOLN
500.0000 [IU] | Freq: Once | INTRAVENOUS | Status: AC
  Administered 2020-12-26: 500 [IU] via INTRAVENOUS
  Filled 2020-12-26: qty 5

## 2020-12-26 NOTE — Assessment & Plan Note (Addendum)
#  Pancreatic adenocarcinoma/locally advanced/metastatic disease [question liver lesions; abdominal/retroperitoneal adenopathy-CT scan November 15, 2020].  Discontinued neoadjuvant chemotherapy.  Discussed the goal of therapy would be is to control the disease/palliative basis. DISCONTINUED  gemcitabine- continue radiation until October 11th.    # Continue radiation.  Discussed that we will plan to get a CT scan approximately a month postradiation.  Can consider Xeloda/infusion of 5-FU liposomal irinotecan  # weight loss: Recommend continue  Megace- stable.  # COPD- stable on Advair/  xopenex prn.   # Chronic Diarrhea/adominal pain - on nocro qhs prn.   #Recurrent biliary obstruction/elevated LFTs-s/p repeat ERCP [AUG 11th, Dr.Wohl]- STABLE  # DISPOSITION: de-access # NO chemo today; No IVfs. # Follow up in 3 weeks- MD; labs- cbc/cmp;ca-19-9- NO chemo; possible iVFS Dr.B

## 2020-12-26 NOTE — Progress Notes (Signed)
[facility] NOTE  Patient Care Team: Erma Heritage, MD as PCP - General (Family Medicine) Rico Junker, RN as Registered Nurse Theodore Demark, RN as Registered Nurse Ezzard Standing, PA-C as Physician Assistant (Gastroenterology) Bary Castilla, Forest Gleason, MD (General Surgery) Clent Jacks, RN as Oncology Nurse Navigator Cammie Sickle, MD as Consulting Physician (Hematology and Oncology)  CHIEF COMPLAINTS/PURPOSE OF CONSULTATION: Pancreatic cancer  #  Oncology History Overview Note  # MAY 2022-pancreatic adenocarcinoma [obstructive jaundice-]MRI-18 mm pancreatic head mass; EUS [[facility]; Dr.Spaete]-abutment of portal vein/SMV; no lymphadenopathy noted; May 5 CA 19-9-163; s/p stenting -May 25th-47.   #Obstructive jaundice-s/p ERCP stenting [Dr.Wohl]  MAY 2022- ENDOSONOGRAPHIC FINDING: : A round mass was identified in the pancreatic head.  The mass was hypoechoic. The mass measured 16 mm by 17  mm in maximal cross-sectional diameter. The outer  margins were irregular. There was sonographic evidence  suggesting invasion into the portal vein (manifested  by abutment) and the superior mesenteric vein  (manifested by abutment).   #Pancreatic adenocarcinoma pT1 however on EUS/CT [[facility]. Dr.Shah]-concerning for tumor abutting the celiac axis /common hepatic artery /portal vein/superior mesenteric vein; without any metastatic disease.  Borderline resectable Elevated CA 19-9.  [S/p evaluation at [facility]  # June 9th- FOLFIRINOX cycle #1; discontinued because of poor GI tolerance  # &/01-2021-gemcitabine Abraxane [100mg /2] day 1 day 815  # July 11th-gemcitabine and Abraxane day.  November 15, 2020-CT scan progressive disease/poor tolerance to therapy  # AUG 29th, 2022-gemcitabine single agent weekly-concurrent radiation;  December 12, 2020-discontinued gemcitabine single agent because of significant respiratory distress post gemcitabine infusion.   Continue local radiation ---------------------------------------------------------      Malignant neoplasm of head of pancreas ([place])  08/24/2020 Initial Diagnosis   Malignant neoplasm of head of pancreas ([place])   09/09/2020 - 09/12/2020 Chemotherapy          10/10/2020 -  Chemotherapy    Patient is on Treatment Plan: PANCREATIC ABRAXANE / GEMCITABINE D1,8,15 Q28D          HISTORY OF PRESENTING ILLNESS: Caucasian female patient ambulating independently.  Accompanied by her son. Shirley Barton 70 y.o.  female with a history of COPD; chronic abdominal pain-pancreatic cancer with poor tolerance to neoadjuvant chemotherapy-locally advanced/metastatic disease is here for follow-up.  Gemcitabine has been discontinued because of respiratory issues.  Patient tolerating radiation fairly well.  No nausea no vomiting.  No fevers or chills.  Started on Megace.  Appetite improved.  She stopped taking Megace for unclear reasons.  Review of Systems  Constitutional:  Positive for malaise/fatigue and weight loss. Negative for chills, diaphoresis and fever.  HENT:  Negative for nosebleeds and sore throat.   Eyes:  Negative for double vision.  Respiratory:  Positive for shortness of breath. Negative for cough, hemoptysis, sputum production and wheezing.   Cardiovascular:  Negative for chest pain, palpitations, orthopnea and leg swelling.  Gastrointestinal:  Positive for abdominal pain and diarrhea. Negative for blood in stool, constipation, heartburn, melena and vomiting.  Genitourinary:  Negative for dysuria, frequency and urgency.  Musculoskeletal:  Negative for back pain and joint pain.  Skin: Negative.  Negative for itching and rash.  Neurological:  Negative for dizziness, tingling, focal weakness, weakness and headaches.  Endo/Heme/Allergies:  Does not bruise/bleed easily.  Psychiatric/Behavioral:  Negative for depression. The patient is not nervous/anxious and does not have insomnia.      MEDICAL HISTORY:  Past Medical History:  Diagnosis Date   Abdominal  pain, generalized    Anxiety    Atypical chest pain    Back pain    Basal cell carcinoma of skin 07-12-2011   resected from Left scalp area.    COPD (chronic obstructive pulmonary disease) (HCC)    Depression    DOE (dyspnea on exertion)    GERD (gastroesophageal reflux disease)    Hypertension    Pancreatic cancer ([place])    Shortness of breath dyspnea     SURGICAL HISTORY: Past Surgical History:  Procedure Laterality Date   BASAL CELL CARCINOMA EXCISION  2011/07/12   CARPAL TUNNEL RELEASE Left 90s   CHOLECYSTECTOMY N/A 09/16/2015   Procedure: LAPAROSCOPIC CHOLECYSTECTOMY WITH INTRAOPERATIVE CHOLANGIOGRAM;  Surgeon: Robert Bellow, MD;  Location: [facility] ORS;  Service: General;  Laterality: N/A;   COLONOSCOPY  1990"s   [place]   ENDOSCOPIC RETROGRADE CHOLANGIOPANCREATOGRAPHY (ERCP) WITH PROPOFOL N/A 11/10/2020   Procedure: ENDOSCOPIC RETROGRADE CHOLANGIOPANCREATOGRAPHY (ERCP) WITH PROPOFOL;  Surgeon: Lucilla Lame, MD;  Location: [facility] ENDOSCOPY;  Service: Endoscopy;  Laterality: N/A;   ERCP N/A 08/05/2020   Procedure: ENDOSCOPIC RETROGRADE CHOLANGIOPANCREATOGRAPHY (ERCP);  Surgeon: Lucilla Lame, MD;  Location[facility] ENDOSCOPY;  Service: Endoscopy;  Laterality: N/A;   ESOPHAGOGASTRODUODENOSCOPY (EGD) WITH PROPOFOL N/A 02/05/2017   Procedure: ESOPHAGOGASTRODUODENOSCOPY (EGD) WITH PROPOFOL;  Surgeon: Lollie Sails, MD;  Location: [facility] ENDOSCOPY;  Service: Endoscopy;  Laterality: N/A;   PERIPHERAL VASCULAR CATHETERIZATION Bilateral 12/27/2015   Procedure: Lower Extremity Angiography;  Surgeon: Katha Cabal, MD;  Location: [facility] CV LAB;  Service: Cardiovascular;  Laterality: Bilateral;   PORTA CATH INSERTION N/A 09/08/2020   Procedure: PORTA CATH INSERTION;  Surgeon: Algernon Huxley, MD;  Location: [place] CV LAB;  Service: Cardiovascular;  Laterality: N/A;   TONSILLECTOMY      SOCIAL HISTORY: Social  History   Socioeconomic History   Marital status: Divorced    Spouse name: Not on file   Number of children: Not on file   Years of education: Not on file   Highest education level: Not on file  Occupational History   Occupation: retired  Tobacco Use   Smoking status: Former    Types: Cigarettes    Quit date: 03/02/2012    Years since quitting: 8.8   Smokeless tobacco: Never  Vaping Use   Vaping Use: Never used  Substance and Sexual Activity   Alcohol use: No    Alcohol/week: 0.0 standard drinks   Drug use: No   Sexual activity: Not on file  Other Topics Concern   Not on file  Social History Narrative   [place]; self; smoker- quit 2011-07-12; daughter died of leukemia-2020. 2 sons [[place]; [place]]; used to be caregiver/ retd.    Social Determinants of Health   Financial Resource Strain: Not on file  Food Insecurity: Not on file  Transportation Needs: Not on file  Physical Activity: Not on file  Stress: Not on file  Social Connections: Not on file  Intimate Partner Violence: Not on file    FAMILY HISTORY: Family History  Problem Relation Age of Onset   Cerebral palsy Daughter    Colon polyps Sister    Leukemia Daughter    Depression Son    Breast cancer Neg Hx     ALLERGIES:  is allergic to tramadol and penicillins.  MEDICATIONS:  Current Outpatient Medications  Medication Sig Dispense Refill   acetaminophen (TYLENOL) 325 MG tablet Take 650 mg by mouth every 6 (six) hours as needed for mild pain or moderate pain.  ALPRAZolam (XANAX) 0.25 MG tablet Take 1 tablet (0.25 mg total) by mouth 3 (three) times daily as needed for anxiety. 30 tablet 0   B Complex-C-Folic Acid (B COMPLEX-VITAMIN C-FOLIC ACID) 1 MG tablet Take 1 tablet by mouth daily with breakfast. 30 tablet 0   Cetirizine HCl (ZYRTEC ALLERGY PO) Take 1 tablet by mouth daily as needed (allergies).     diphenoxylate-atropine (LOMOTIL) 2.5-0.025 MG tablet Take 1 tablet by mouth 4 (four) times daily as  needed for diarrhea or loose stools. 60 tablet 1   Fluticasone-Salmeterol (ADVAIR) 100-50 MCG/DOSE AEPB Inhale 1 puff into the lungs 2 (two) times daily.     HYDROcodone-acetaminophen (NORCO) 5-325 MG tablet Take 1 tablet by mouth every 12 (twelve) hours as needed for moderate pain. 45 tablet 0   ipratropium-albuterol (DUONEB) 0.5-2.5 (3) MG/3ML SOLN Take 3 mLs by nebulization every 4 (four) hours as needed. 360 mL 0   KLOR-CON M20 20 MEQ tablet TAKE 1 TABLET BY MOUTH TWICE A DAY 60 tablet 0   levalbuterol (XOPENEX HFA) 45 MCG/ACT inhaler Inhale 1-2 puffs into the lungs every 6 (six) hours as needed for wheezing or shortness of breath. 1 each 12   lidocaine-prilocaine (EMLA) cream Apply 30 -45 mins prior to port access. 30 g 0   magic mouthwash w/lidocaine SOLN Take 10 mLs by mouth every 2 (two) hours as needed for mouth pain. 360 mL 0   megestrol (MEGACE ES) 625 MG/5ML suspension Take 5 mLs (625 mg total) by mouth daily. 150 mL 0   ondansetron (ZOFRAN) 8 MG tablet One pill every 8 hours as needed for nausea/vomitting. 40 tablet 3   pantoprazole (PROTONIX) 40 MG tablet Take 1 tablet (40 mg total) by mouth daily. 90 tablet 0   predniSONE (DELTASONE) 10 MG tablet Take 4 tablets (40mg ) x 2 days, then take 3 tablets (30mg ) x 2 days, then take 2 tablets (20mg ) x 2 days, then take 1 tablet (10mg ) x 2 days, then stop 20 tablet 0   prochlorperazine (COMPAZINE) 10 MG tablet Take 1 tablet (10 mg total) by mouth every 6 (six) hours as needed for nausea or vomiting. 40 tablet 3   No current facility-administered medications for this visit.      Marland Kitchen  PHYSICAL EXAMINATION: ECOG PERFORMANCE STATUS: 0 - Asymptomatic  Vitals:   12/26/20 1051  BP: 123/87  Pulse: 92  Resp: 18  Temp: 98.4 F (36.9 C)  SpO2: 100%   Filed Weights   12/26/20 1051  Weight: 96 lb 6 oz (43.7 kg)    Physical Exam HENT:     Head: Normocephalic and atraumatic.     Mouth/Throat:     Pharynx: No oropharyngeal exudate.   Eyes:     Pupils: Pupils are equal, round, and reactive to light.  Cardiovascular:     Rate and Rhythm: Normal rate and regular rhythm.  Pulmonary:     Effort: No respiratory distress.     Breath sounds: No wheezing.     Comments: Decreased air entry bilaterally.  No wheeze or crackles Abdominal:     General: Bowel sounds are normal. There is no distension.     Palpations: Abdomen is soft. There is no mass.     Tenderness: There is no abdominal tenderness. There is no guarding or rebound.  Musculoskeletal:        General: No tenderness. Normal range of motion.     Cervical back: Normal range of motion and neck supple.  Skin:  General: Skin is warm.  Neurological:     Mental Status: She is alert and oriented to person, place, and time.  Psychiatric:        Mood and Affect: Affect normal.     LABORATORY DATA:  I have reviewed the data as listed Lab Results  Component Value Date   WBC 5.8 12/26/2020   HGB 12.2 12/26/2020   HCT 38.4 12/26/2020   MCV 95.5 12/26/2020   PLT 240 12/26/2020   Recent Labs    08/04/20 1138 08/06/20 0457 12/12/20 0814 12/15/20 1003 12/26/20 0945  NA  --    < > 136 136 135  K  --    < > 3.1* 3.9 3.5  CL  --    < > 104 104 102  CO2  --    < > 25 25 26   GLUCOSE  --    < > 83 87 86  BUN  --    < > 21 11 5*  CREATININE  --    < > 0.53 0.55 0.48  CALCIUM  --    < > 9.1 9.0 8.8*  GFRNONAA  --    < > >60 >60 >60  PROT  --    < > 7.2 7.2 7.2  ALBUMIN  --    < > 3.7 3.7 3.7  AST  --    < > 19 22 27   ALT  --    < > 19 19 19   ALKPHOS  --    < > 71 65 64  BILITOT  --    < > 0.7 0.9 0.7  BILIDIR 3.7*  --   --   --   --    < > = values in this interval not displayed.    RADIOGRAPHIC STUDIES: I have personally reviewed the radiological images as listed and agreed with the findings in the report. DG Chest 2 View  Result Date: 12/08/2020 CLINICAL DATA:  Shortness of breath following chemotherapy 2 weeks ago. History of pancreatic cancer. EXAM:  CHEST - 2 VIEW COMPARISON:  Chest x-ray 11/28/2020 FINDINGS: The right IJ power port is stable. The cardiac silhouette, mediastinal and hilar contours are normal. Stable emphysematous changes with fairly marked hyperinflation but no acute overlying pulmonary process. No pulmonary lesions. No pleural effusions. No pneumothorax. The bony thorax is intact. IMPRESSION: Emphysematous changes but no acute pulmonary findings. Electronically Signed   By: Marijo Sanes M.D.   On: 12/08/2020 12:25   CT Angio Chest PE W and/or Wo Contrast  Result Date: 11/28/2020 CLINICAL DATA:  Pancreatic cancer, COPD, shortness of breath, tachycardia, evaluate for PE EXAM: CT ANGIOGRAPHY CHEST WITH CONTRAST TECHNIQUE: Multidetector CT imaging of the chest was performed using the standard protocol during bolus administration of intravenous contrast. Multiplanar CT image reconstructions and MIPs were obtained to evaluate the vascular anatomy. CONTRAST:  52mL OMNIPAQUE IOHEXOL 350 MG/ML SOLN COMPARISON:  None. FINDINGS: Cardiovascular: Satisfactory opacification of the pulmonary arteries to the segmental level. No evidence of pulmonary embolism. Normal heart size. Three-vessel coronary artery calcifications. No pericardial effusion. Aortic atherosclerosis Mediastinum/Nodes: No enlarged mediastinal, hilar, or axillary lymph nodes. Small hiatal hernia. Thyroid gland, trachea, and esophagus demonstrate no significant findings. Lungs/Pleura: Severe centrilobular emphysema. Diffuse bilateral bronchial wall thickening. No pleural effusion or pneumothorax. Upper Abdomen: No acute abnormality. Partially imaged common bile duct stent and post stenting pneumobilia in the included upper abdomen. Musculoskeletal: No chest wall abnormality. No acute or significant osseous findings. Review of the MIP  images confirms the above findings. IMPRESSION: 1. Negative examination for pulmonary embolism. 2. Severe centrilobular emphysema with diffuse bilateral  bronchial wall thickening, consistent with nonspecific infectious or inflammatory bronchitis. 3. Coronary artery disease. 4. Partially imaged common bile duct stent and post stenting pneumobilia in the included upper abdomen, in keeping with patient history of pancreatic cancer. Aortic Atherosclerosis (ICD10-I70.0) and Emphysema (ICD10-J43.9). Electronically Signed   By: Eddie Candle M.D.   On: 11/28/2020 19:13   DG Chest Portable 1 View  Result Date: 11/28/2020 CLINICAL DATA:  Shortness of breath history of pancreatic carcinoma EXAM: PORTABLE CHEST 1 VIEW COMPARISON:  None. FINDINGS: Cardiac shadow is within normal limits. Aortic calcifications are seen. Right chest wall port is noted in satisfactory position. Lungs are well aerated without focal infiltrate. No acute bony abnormality is seen. IMPRESSION: No acute abnormality noted. Electronically Signed   By: Inez Catalina M.D.   On: 11/28/2020 16:31     ASSESSMENT & PLAN:   Malignant neoplasm of head of pancreas [facility]) #Pancreatic adenocarcinoma/locally advanced/metastatic disease [question liver lesions; abdominal/retroperitoneal adenopathy-CT scan November 15, 2020].  Discontinued neoadjuvant chemotherapy.  Discussed the goal of therapy would be is to control the disease/palliative basis. DISCONTINUED  gemcitabine- continue radiation until October 11th.    # Continue radiation.  Discussed that we will plan to get a CT scan approximately a month postradiation.  Can consider Xeloda/infusion of 5-FU liposomal irinotecan  # weight loss: Recommend continue  Megace- stable.  # COPD- stable on Advair/  xopenex prn.   # Chronic Diarrhea/adominal pain - on nocro qhs prn.   #Recurrent biliary obstruction/elevated LFTs-s/p repeat ERCP [AUG 11th, Dr.Wohl]- STABLE  # DISPOSITION: de-access # NO chemo today; No IVfs. # Follow up in 3 weeks- MD; labs- cbc/cmp;ca-19-9- NO chemo; possible iVFS Dr.B    All questions were answered. The patient knows to call  the clinic with any problems, questions or concerns.    Cammie Sickle, MD 12/26/2020 3:11 PM

## 2020-12-26 NOTE — Progress Notes (Signed)
Nutrition  RD was planning to see patient during fluids today but not needed.  RD not able to see patient while in clinic.   Called patient this afternoon to follow-up. No answer.  Left message with call back number.   Fran Neiswonger B. Zenia Resides, [place], [place] Registered Dietitian [phone] (mobile)

## 2020-12-26 NOTE — Progress Notes (Signed)
Pt reports that she is feeling much better. States that her breathing has improved. She believes the inhalers that were prescribed by [facility] have helped.

## 2020-12-27 ENCOUNTER — Ambulatory Visit
Admission: RE | Admit: 2020-12-27 | Discharge: 2020-12-27 | Disposition: A | Discharge status: Home / Self Care | Payer: Medicare HMO | Source: Ambulatory Visit | Attending: Radiation Oncology | Admitting: Radiation Oncology

## 2020-12-27 DIAGNOSIS — Z510 Encounter for antineoplastic radiation therapy: Secondary | ICD-10-CM | POA: Diagnosis not present

## 2020-12-27 LAB — CANCER ANTIGEN 19-9: CA 19-9: 32 U/mL (ref 0–35)

## 2020-12-28 ENCOUNTER — Ambulatory Visit
Admission: RE | Admit: 2020-12-28 | Discharge: 2020-12-28 | Disposition: A | Discharge status: Home / Self Care | Payer: Medicare HMO | Source: Ambulatory Visit | Attending: Radiation Oncology | Admitting: Radiation Oncology

## 2020-12-28 DIAGNOSIS — Z510 Encounter for antineoplastic radiation therapy: Secondary | ICD-10-CM | POA: Diagnosis not present

## 2020-12-29 ENCOUNTER — Ambulatory Visit
Admission: RE | Admit: 2020-12-29 | Discharge: 2020-12-29 | Disposition: A | Discharge status: Home / Self Care | Payer: Medicare HMO | Source: Ambulatory Visit | Attending: Radiation Oncology | Admitting: Radiation Oncology

## 2020-12-29 DIAGNOSIS — Z510 Encounter for antineoplastic radiation therapy: Secondary | ICD-10-CM | POA: Diagnosis not present

## 2020-12-30 ENCOUNTER — Ambulatory Visit: Payer: Medicare HMO

## 2021-01-02 ENCOUNTER — Ambulatory Visit
Admission: RE | Admit: 2021-01-02 | Discharge: 2021-01-02 | Disposition: A | Discharge status: Home / Self Care | Payer: Medicare HMO | Source: Ambulatory Visit | Attending: Radiation Oncology | Admitting: Radiation Oncology

## 2021-01-02 DIAGNOSIS — C250 Malignant neoplasm of head of pancreas: Secondary | ICD-10-CM | POA: Diagnosis not present

## 2021-01-02 DIAGNOSIS — Z510 Encounter for antineoplastic radiation therapy: Secondary | ICD-10-CM | POA: Diagnosis not present

## 2021-01-03 ENCOUNTER — Ambulatory Visit
Admission: RE | Admit: 2021-01-03 | Discharge: 2021-01-03 | Disposition: A | Discharge status: Home / Self Care | Payer: Medicare HMO | Source: Ambulatory Visit | Attending: Radiation Oncology | Admitting: Radiation Oncology

## 2021-01-03 DIAGNOSIS — Z510 Encounter for antineoplastic radiation therapy: Secondary | ICD-10-CM | POA: Diagnosis not present

## 2021-01-04 ENCOUNTER — Ambulatory Visit
Admission: RE | Admit: 2021-01-04 | Discharge: 2021-01-04 | Disposition: A | Discharge status: Home / Self Care | Payer: Medicare HMO | Source: Ambulatory Visit | Attending: Radiation Oncology | Admitting: Radiation Oncology

## 2021-01-04 DIAGNOSIS — Z510 Encounter for antineoplastic radiation therapy: Secondary | ICD-10-CM | POA: Diagnosis not present

## 2021-01-05 ENCOUNTER — Ambulatory Visit
Admission: RE | Admit: 2021-01-05 | Discharge: 2021-01-05 | Disposition: A | Discharge status: Home / Self Care | Payer: Medicare HMO | Source: Ambulatory Visit | Attending: Radiation Oncology | Admitting: Radiation Oncology

## 2021-01-05 DIAGNOSIS — Z510 Encounter for antineoplastic radiation therapy: Secondary | ICD-10-CM | POA: Diagnosis not present

## 2021-01-06 ENCOUNTER — Ambulatory Visit
Admission: RE | Admit: 2021-01-06 | Discharge: 2021-01-06 | Disposition: A | Discharge status: Home / Self Care | Payer: Medicare HMO | Source: Ambulatory Visit | Attending: Radiation Oncology | Admitting: Radiation Oncology

## 2021-01-06 DIAGNOSIS — Z510 Encounter for antineoplastic radiation therapy: Secondary | ICD-10-CM | POA: Diagnosis not present

## 2021-01-09 ENCOUNTER — Ambulatory Visit
Admission: RE | Admit: 2021-01-09 | Discharge: 2021-01-09 | Disposition: A | Discharge status: Home / Self Care | Payer: Medicare HMO | Source: Ambulatory Visit | Attending: Radiation Oncology | Admitting: Radiation Oncology

## 2021-01-09 ENCOUNTER — Ambulatory Visit: Payer: Medicare HMO

## 2021-01-09 DIAGNOSIS — Z510 Encounter for antineoplastic radiation therapy: Secondary | ICD-10-CM | POA: Diagnosis not present

## 2021-01-10 ENCOUNTER — Ambulatory Visit
Admission: RE | Admit: 2021-01-10 | Discharge: 2021-01-10 | Disposition: A | Discharge status: Home / Self Care | Payer: Medicare HMO | Source: Ambulatory Visit | Attending: Radiation Oncology | Admitting: Radiation Oncology

## 2021-01-10 ENCOUNTER — Ambulatory Visit: Payer: Medicare HMO

## 2021-01-10 DIAGNOSIS — Z510 Encounter for antineoplastic radiation therapy: Secondary | ICD-10-CM | POA: Diagnosis not present

## 2021-01-11 ENCOUNTER — Ambulatory Visit
Admission: RE | Admit: 2021-01-11 | Discharge: 2021-01-11 | Disposition: A | Discharge status: Home / Self Care | Payer: Medicare HMO | Source: Ambulatory Visit | Attending: Radiation Oncology | Admitting: Radiation Oncology

## 2021-01-11 DIAGNOSIS — Z510 Encounter for antineoplastic radiation therapy: Secondary | ICD-10-CM | POA: Diagnosis not present

## 2021-01-16 ENCOUNTER — Inpatient Hospital Stay: Payer: Medicare HMO

## 2021-01-16 ENCOUNTER — Inpatient Hospital Stay: Payer: Medicare HMO | Attending: Internal Medicine

## 2021-01-16 ENCOUNTER — Inpatient Hospital Stay (HOSPITAL_BASED_OUTPATIENT_CLINIC_OR_DEPARTMENT_OTHER): Payer: Medicare HMO | Admitting: Internal Medicine

## 2021-01-16 ENCOUNTER — Encounter: Payer: Self-pay | Admitting: Internal Medicine

## 2021-01-16 ENCOUNTER — Other Ambulatory Visit: Payer: Self-pay

## 2021-01-16 DIAGNOSIS — J449 Chronic obstructive pulmonary disease, unspecified: Secondary | ICD-10-CM | POA: Diagnosis not present

## 2021-01-16 DIAGNOSIS — F419 Anxiety disorder, unspecified: Secondary | ICD-10-CM | POA: Diagnosis not present

## 2021-01-16 DIAGNOSIS — C250 Malignant neoplasm of head of pancreas: Secondary | ICD-10-CM

## 2021-01-16 DIAGNOSIS — I10 Essential (primary) hypertension: Secondary | ICD-10-CM | POA: Diagnosis not present

## 2021-01-16 DIAGNOSIS — Z87891 Personal history of nicotine dependence: Secondary | ICD-10-CM | POA: Insufficient documentation

## 2021-01-16 DIAGNOSIS — K831 Obstruction of bile duct: Secondary | ICD-10-CM | POA: Insufficient documentation

## 2021-01-16 DIAGNOSIS — Z95828 Presence of other vascular implants and grafts: Secondary | ICD-10-CM

## 2021-01-16 DIAGNOSIS — R590 Localized enlarged lymph nodes: Secondary | ICD-10-CM | POA: Diagnosis not present

## 2021-01-16 DIAGNOSIS — Z806 Family history of leukemia: Secondary | ICD-10-CM | POA: Insufficient documentation

## 2021-01-16 DIAGNOSIS — R7989 Other specified abnormal findings of blood chemistry: Secondary | ICD-10-CM | POA: Diagnosis not present

## 2021-01-16 LAB — CBC WITH DIFFERENTIAL/PLATELET
Abs Immature Granulocytes: 0.03 10*3/uL (ref 0.00–0.07)
Basophils Absolute: 0 10*3/uL (ref 0.0–0.1)
Basophils Relative: 1 %
Eosinophils Absolute: 0.1 10*3/uL (ref 0.0–0.5)
Eosinophils Relative: 2 %
HCT: 39.8 % (ref 36.0–46.0)
Hemoglobin: 13 g/dL (ref 12.0–15.0)
Immature Granulocytes: 0 %
Lymphocytes Relative: 14 %
Lymphs Abs: 1.1 10*3/uL (ref 0.7–4.0)
MCH: 30.5 pg (ref 26.0–34.0)
MCHC: 32.7 g/dL (ref 30.0–36.0)
MCV: 93.4 fL (ref 80.0–100.0)
Monocytes Absolute: 0.9 10*3/uL (ref 0.1–1.0)
Monocytes Relative: 10 %
Neutro Abs: 6.1 10*3/uL (ref 1.7–7.7)
Neutrophils Relative %: 73 %
Platelets: 180 10*3/uL (ref 150–400)
RBC: 4.26 MIL/uL (ref 3.87–5.11)
RDW: 14.4 % (ref 11.5–15.5)
WBC: 8.3 10*3/uL (ref 4.0–10.5)
nRBC: 0 % (ref 0.0–0.2)

## 2021-01-16 LAB — COMPREHENSIVE METABOLIC PANEL
ALT: 12 U/L (ref 0–44)
AST: 19 U/L (ref 15–41)
Albumin: 3.7 g/dL (ref 3.5–5.0)
Alkaline Phosphatase: 46 U/L (ref 38–126)
Anion gap: 9 (ref 5–15)
BUN: 9 mg/dL (ref 8–23)
CO2: 25 mmol/L (ref 22–32)
Calcium: 9.5 mg/dL (ref 8.9–10.3)
Chloride: 103 mmol/L (ref 98–111)
Creatinine, Ser: 0.51 mg/dL (ref 0.44–1.00)
GFR, Estimated: >60 mL/min (ref >60)
Glucose, Bld: 79 mg/dL (ref 70–99)
Potassium: 3.6 mmol/L (ref 3.5–5.1)
Sodium: 137 mmol/L (ref 135–145)
Total Bilirubin: 0.6 mg/dL (ref 0.3–1.2)
Total Protein: 7 g/dL (ref 6.5–8.1)

## 2021-01-16 MED ORDER — HEPARIN SOD (PORK) LOCK FLUSH 100 UNIT/ML IV SOLN
500.0000 [IU] | Freq: Once | INTRAVENOUS | Status: AC
  Administered 2021-01-16: 500 [IU] via INTRAVENOUS
  Filled 2021-01-16: qty 5

## 2021-01-16 MED ORDER — SODIUM CHLORIDE 0.9% FLUSH
10.0000 mL | Freq: Once | INTRAVENOUS | Status: AC
  Administered 2021-01-16: 10 mL via INTRAVENOUS
  Filled 2021-01-16: qty 10

## 2021-01-16 MED ORDER — MEGESTROL ACETATE 625 MG/5ML PO SUSP: 625.0000 mg | Freq: Every day | ORAL | 0 refills | Status: DC

## 2021-01-16 MED ORDER — SODIUM CHLORIDE 0.9% FLUSH
10.0000 mL | Freq: Once | INTRAVENOUS | Status: DC
  Filled 2021-01-16: qty 10

## 2021-01-16 NOTE — Assessment & Plan Note (Addendum)
#  Pancreatic adenocarcinoma/locally advanced/metastatic disease [question liver lesions; abdominal/retroperitoneal adenopathy-CT scan November 15, 2020].  Currently s/p palliative radiation to the pancreas[10/11.    #currently s/p  Radiation [10/11].  Discussed that we will plan to get a CT scan approximately a month postradiation.  Can consider Xeloda/infusion of 5-FU liposomal irinotecan-discussed that patient treatment options will depend upon imaging findings/patient performance status/tolerance to therapy etc.. Will order CT scan today.   # weight loss: Recommend continue  Megace- stable; refilled   # COPD- on Advair/  xopenex prn- stable;   # Chronic Diarrhea/adominal pain - on nocro qhs prn- STABLE.   #Recurrent biliary obstruction/elevated LFTs-s/p repeat ERCP [AUG 11th, Dr.Wohl]- stable;   # Anxiety: continue xanax - 1 qhs- as needed  # DISPOSITION: de-access # NO chemo today; No IVfs. # Follow up in 4 weeks- MD; labs- cbc/cmp;ca-19-9; CT scan- [facility] Dr.B

## 2021-01-16 NOTE — Progress Notes (Signed)
[place] NOTE  Patient Care Team: Erma Heritage, MD as PCP - General (Family Medicine) Rico Junker, RN as Registered Nurse Theodore Demark, RN as Registered Nurse Ezzard Standing, PA-C as Physician Assistant (Gastroenterology) Bary Castilla, Forest Gleason, MD (General Surgery) Clent Jacks, RN as Oncology Nurse Navigator Cammie Sickle, MD as Consulting Physician (Hematology and Oncology)  CHIEF COMPLAINTS/PURPOSE OF CONSULTATION: Pancreatic cancer  #  Oncology History Overview Note  # MAY 2022-pancreatic adenocarcinoma [obstructive jaundice-]MRI-18 mm pancreatic head mass; EUS [[facility]; Dr.Spaete]-abutment of portal vein/SMV; no lymphadenopathy noted; May 5 CA 19-9-163; s/p stenting -May 25th-47.   #Obstructive jaundice-s/p ERCP stenting [Dr.Wohl]  MAY 2022- ENDOSONOGRAPHIC FINDING: : A round mass was identified in the pancreatic head.  The mass was hypoechoic. The mass measured 16 mm by 17  mm in maximal cross-sectional diameter. The outer  margins were irregular. There was sonographic evidence  suggesting invasion into the portal vein (manifested  by abutment) and the superior mesenteric vein  (manifested by abutment).   #Pancreatic adenocarcinoma pT1 however on EUS/CT [[facility]. Dr.Shah]-concerning for tumor abutting the celiac axis /common hepatic artery /portal vein/superior mesenteric vein; without any metastatic disease.  Borderline resectable Elevated CA 19-9.  [S/p evaluation at [place]  # June 9th- FOLFIRINOX cycle #1; discontinued because of poor GI tolerance  # &/01-2021-gemcitabine Abraxane [100mg /2] day 1 day 815  # July 11th-gemcitabine and Abraxane day.  November 15, 2020-CT scan progressive disease/poor tolerance to therapy  # AUG 29th, 2022-gemcitabine single agent weekly-concurrent radiation;  December 12, 2020-discontinued gemcitabine single agent because of significant respiratory distress post gemcitabine infusion.   Continue local radiation ---------------------------------------------------------      Malignant neoplasm of head of pancreas ([place])  08/24/2020 Initial Diagnosis   Malignant neoplasm of head of pancreas ([place])   09/09/2020 - 09/12/2020 Chemotherapy          10/10/2020 -  Chemotherapy    Patient is on Treatment Plan: PANCREATIC ABRAXANE / GEMCITABINE D1,8,15 Q28D          HISTORY OF PRESENTING ILLNESS: Caucasian female patient ambulating independently.  Alone.   Shirley Barton 70 y.o.  female with a history of COPD; chronic abdominal pain-pancreatic cancer with poor tolerance to  chemotherapy-lmetastatic disease-currently on radiation to the pancreas on a palliative basis is here for follow-up.  Patient finished radiation approximately 1 week ago.  Appetite is fair.  Weight holding steady she continues with Megace.  Otherwise no new shortness of breath nausea vomiting.  Review of Systems  Constitutional:  Positive for malaise/fatigue and weight loss. Negative for chills, diaphoresis and fever.  HENT:  Negative for nosebleeds and sore throat.   Eyes:  Negative for double vision.  Respiratory:  Positive for shortness of breath. Negative for cough, hemoptysis, sputum production and wheezing.   Cardiovascular:  Negative for chest pain, palpitations, orthopnea and leg swelling.  Gastrointestinal:  Positive for abdominal pain and diarrhea. Negative for blood in stool, constipation, heartburn, melena and vomiting.  Genitourinary:  Negative for dysuria, frequency and urgency.  Musculoskeletal:  Negative for back pain and joint pain.  Skin: Negative.  Negative for itching and rash.  Neurological:  Negative for dizziness, tingling, focal weakness, weakness and headaches.  Endo/Heme/Allergies:  Does not bruise/bleed easily.  Psychiatric/Behavioral:  Negative for depression. The patient is not nervous/anxious and does not have insomnia.     MEDICAL HISTORY:  Past Medical History:   Diagnosis Date   Abdominal pain, generalized  Anxiety    Atypical chest pain    Back pain    Basal cell carcinoma of skin 06-20-11   resected from Left scalp area.    COPD (chronic obstructive pulmonary disease) (HCC)    Depression    DOE (dyspnea on exertion)    GERD (gastroesophageal reflux disease)    Hypertension    Pancreatic cancer ([place])    Shortness of breath dyspnea     SURGICAL HISTORY: Past Surgical History:  Procedure Laterality Date   BASAL CELL CARCINOMA EXCISION  06-20-11   CARPAL TUNNEL RELEASE Left 90s   CHOLECYSTECTOMY N/A 09/16/2015   Procedure: LAPAROSCOPIC CHOLECYSTECTOMY WITH INTRAOPERATIVE CHOLANGIOGRAM;  Surgeon: Robert Bellow, MD;  Location: [facility] ORS;  Service: General;  Laterality: N/A;   COLONOSCOPY  1990"s   [place]   ENDOSCOPIC RETROGRADE CHOLANGIOPANCREATOGRAPHY (ERCP) WITH PROPOFOL N/A 11/10/2020   Procedure: ENDOSCOPIC RETROGRADE CHOLANGIOPANCREATOGRAPHY (ERCP) WITH PROPOFOL;  Surgeon: Lucilla Lame, MD;  Location: [facility] ENDOSCOPY;  Service: Endoscopy;  Laterality: N/A;   ERCP N/A 08/05/2020   Procedure: ENDOSCOPIC RETROGRADE CHOLANGIOPANCREATOGRAPHY (ERCP);  Surgeon: Lucilla Lame, MD;  Location: [facility] ENDOSCOPY;  Service: Endoscopy;  Laterality: N/A;   ESOPHAGOGASTRODUODENOSCOPY (EGD) WITH PROPOFOL N/A 02/05/2017   Procedure: ESOPHAGOGASTRODUODENOSCOPY (EGD) WITH PROPOFOL;  Surgeon: Lollie Sails, MD;  Location: [facility] ENDOSCOPY;  Service: Endoscopy;  Laterality: N/A;   PERIPHERAL VASCULAR CATHETERIZATION Bilateral 12/27/2015   Procedure: Lower Extremity Angiography;  Surgeon: Katha Cabal, MD;  Location: [facility];  Service: Cardiovascular;  Laterality: Bilateral;   PORTA CATH INSERTION N/A 09/08/2020   Procedure: PORTA CATH INSERTION;  Surgeon: Algernon Huxley, MD;  Location: [facility];  Service: Cardiovascular;  Laterality: N/A;   TONSILLECTOMY      SOCIAL HISTORY: Social History   Socioeconomic History   Marital  status: Divorced    Spouse name: Not on file   Number of children: Not on file   Years of education: Not on file   Highest education level: Not on file  Occupational History   Occupation: retired  Tobacco Use   Smoking status: Former    Types: Cigarettes    Quit date: 03/02/2012    Years since quitting: 8.8   Smokeless tobacco: Never  Vaping Use   Vaping Use: Never used  Substance and Sexual Activity   Alcohol use: No    Alcohol/week: 0.0 standard drinks   Drug use: No   Sexual activity: Not on file  Other Topics Concern   Not on file  Social History Narrative   [place]; self; smoker- quit 20-Jun-2011; daughter died of leukemia-2020. 2 sons [[place]; [place]]; used to be caregiver/ retd.    Social Determinants of Health   Financial Resource Strain: Not on file  Food Insecurity: Not on file  Transportation Needs: Not on file  Physical Activity: Not on file  Stress: Not on file  Social Connections: Not on file  Intimate Partner Violence: Not on file    FAMILY HISTORY: Family History  Problem Relation Age of Onset   Cerebral palsy Daughter    Colon polyps Sister    Leukemia Daughter    Depression Son    Breast cancer Neg Hx     ALLERGIES:  is allergic to tramadol and penicillins.  MEDICATIONS:  Current Outpatient Medications  Medication Sig Dispense Refill   acetaminophen (TYLENOL) 325 MG tablet Take 650 mg by mouth every 6 (six) hours as needed for mild pain or moderate pain.     ALPRAZolam (XANAX) 0.25  MG tablet Take 1 tablet (0.25 mg total) by mouth 3 (three) times daily as needed for anxiety. 30 tablet 0   B Complex-C-Folic Acid (B COMPLEX-VITAMIN C-FOLIC ACID) 1 MG tablet Take 1 tablet by mouth daily with breakfast. 30 tablet 0   Cetirizine HCl (ZYRTEC ALLERGY PO) Take 1 tablet by mouth daily as needed (allergies).     diphenoxylate-atropine (LOMOTIL) 2.5-0.025 MG tablet Take 1 tablet by mouth 4 (four) times daily as needed for diarrhea or loose stools. 60 tablet 1    Fluticasone-Salmeterol (ADVAIR) 100-50 MCG/DOSE AEPB Inhale 1 puff into the lungs 2 (two) times daily.     HYDROcodone-acetaminophen (NORCO) 5-325 MG tablet Take 1 tablet by mouth every 12 (twelve) hours as needed for moderate pain. 45 tablet 0   KLOR-CON M20 20 MEQ tablet TAKE 1 TABLET BY MOUTH TWICE A DAY 60 tablet 0   levalbuterol (XOPENEX HFA) 45 MCG/ACT inhaler Inhale 1-2 puffs into the lungs every 6 (six) hours as needed for wheezing or shortness of breath. 1 each 12   lidocaine-prilocaine (EMLA) cream Apply 30 -45 mins prior to port access. 30 g 0   magic mouthwash w/lidocaine SOLN Take 10 mLs by mouth every 2 (two) hours as needed for mouth pain. 360 mL 0   ondansetron (ZOFRAN) 8 MG tablet One pill every 8 hours as needed for nausea/vomitting. 40 tablet 3   pantoprazole (PROTONIX) 40 MG tablet Take 1 tablet (40 mg total) by mouth daily. 90 tablet 0   prochlorperazine (COMPAZINE) 10 MG tablet Take 1 tablet (10 mg total) by mouth every 6 (six) hours as needed for nausea or vomiting. 40 tablet 3   ipratropium-albuterol (DUONEB) 0.5-2.5 (3) MG/3ML SOLN Take 3 mLs by nebulization every 4 (four) hours as needed. 360 mL 0   megestrol (MEGACE ES) 625 MG/5ML suspension Take 5 mLs (625 mg total) by mouth daily. 150 mL 0   No current facility-administered medications for this visit.   Facility-Administered Medications Ordered in Other Visits  Medication Dose Route Frequency Provider Last Rate Last Admin   sodium chloride flush (NS) 0.9 % injection 10 mL  10 mL Intravenous Once Cammie Sickle, MD          .  PHYSICAL EXAMINATION: ECOG PERFORMANCE STATUS: 0 - Asymptomatic  Vitals:   01/16/21 0925  BP: (!) 157/90  Pulse: 96  Resp: 16  Temp: 98.1 F (36.7 C)    Filed Weights   01/16/21 0925  Weight: 97 lb 3.2 oz (44.1 kg)     Physical Exam HENT:     Head: Normocephalic and atraumatic.     Mouth/Throat:     Pharynx: No oropharyngeal exudate.  Eyes:     Pupils: Pupils  are equal, round, and reactive to light.  Cardiovascular:     Rate and Rhythm: Normal rate and regular rhythm.  Pulmonary:     Effort: No respiratory distress.     Breath sounds: No wheezing.     Comments: Decreased air entry bilaterally.  No wheeze or crackles Abdominal:     General: Bowel sounds are normal. There is no distension.     Palpations: Abdomen is soft. There is no mass.     Tenderness: There is no abdominal tenderness. There is no guarding or rebound.  Musculoskeletal:        General: No tenderness. Normal range of motion.     Cervical back: Normal range of motion and neck supple.  Skin:    General: Skin is  warm.  Neurological:     Mental Status: She is alert and oriented to person, place, and time.  Psychiatric:        Mood and Affect: Affect normal.     LABORATORY DATA:  I have reviewed the data as listed Lab Results  Component Value Date   WBC 8.3 01/16/2021   HGB 13.0 01/16/2021   HCT 39.8 01/16/2021   MCV 93.4 01/16/2021   PLT 180 01/16/2021   Recent Labs    08/04/20 1138 08/06/20 0457 12/15/20 1003 12/26/20 0945 01/16/21 0843  NA  --    < > 136 135 137  K  --    < > 3.9 3.5 3.6  CL  --    < > 104 102 103  CO2  --    < > 25 26 25   GLUCOSE  --    < > 87 86 79  BUN  --    < > 11 5* 9  CREATININE  --    < > 0.55 0.48 0.51  CALCIUM  --    < > 9.0 8.8* 9.5  GFRNONAA  --    < > >60 >60 >60  PROT  --    < > 7.2 7.2 7.0  ALBUMIN  --    < > 3.7 3.7 3.7  AST  --    < > 22 27 19   ALT  --    < > 19 19 12   ALKPHOS  --    < > 65 64 46  BILITOT  --    < > 0.9 0.7 0.6  BILIDIR 3.7*  --   --   --   --    < > = values in this interval not displayed.    RADIOGRAPHIC STUDIES: I have personally reviewed the radiological images as listed and agreed with the findings in the report. No results found.   ASSESSMENT & PLAN:   Malignant neoplasm of head of pancreas [facility]) #Pancreatic adenocarcinoma/locally advanced/metastatic disease [question liver lesions;  abdominal/retroperitoneal adenopathy-CT scan November 15, 2020].  Currently s/p palliative radiation to the pancreas[10/11.    #currently s/p  Radiation [10/11].  Discussed that we will plan to get a CT scan approximately a month postradiation.  Can consider Xeloda/infusion of 5-FU liposomal irinotecan-discussed that patient treatment options will depend upon imaging findings/patient performance status/tolerance to therapy etc.. Will order CT scan today.   # weight loss: Recommend continue  Megace- stable; refilled   # COPD- on Advair/  xopenex prn- stable;   # Chronic Diarrhea/adominal pain - on nocro qhs prn- STABLE.   #Recurrent biliary obstruction/elevated LFTs-s/p repeat ERCP [AUG 11th, Dr.Wohl]- stable;   # Anxiety: continue xanax - 1 qhs- as needed  # DISPOSITION: de-access # NO chemo today; No IVfs. # Follow up in 4 weeks- MD; labs- cbc/cmp;ca-19-9; CT scan- [facility] Dr.B    All questions were answered. The patient knows to call the clinic with any problems, questions or concerns.    Cammie Sickle, MD 01/16/2021 12:50 PM

## 2021-02-07 ENCOUNTER — Telehealth: Payer: Self-pay

## 2021-02-07 NOTE — Telephone Encounter (Signed)
Nutrition Follow-up:  Patient with pancreatic cancer.  Patient has completed radiation.  Planning CT scan tomorrow.    Spoke with patient via phone for nutrition follow-up.  Patient reports that appetite is better.  Says that she stopped taking the megace then started back and it is making her feel nervous so she stopped taking it again.  Says that her son was able to get her the Boost [employer] chocolate.  She is drinking 2 a day but feels bloated after drinking them.  Says that she is having a lot of belching.  She reports that she is eating all kinds of foods (meats, vegetables, etc).  Denies diarrhea or constipation.      Medications: reviewed  Labs: reviewed  Anthropometrics:   Weight 97 lb 3.2 oz on 10/17  95 lb on 9/6 92 lb on 8/15 96 lb on 7/25 97 lb on 7/18 98 lb on 7/11 100 lb on 6/20 103 lb on 6/9   NUTRITION DIAGNOSIS: Inadequate oral intake improving   INTERVENTION:  Discussed strategies to help with bloating, full feeling.  Encouraged patient to dilute shake with milk, consuming in smaller amounts frequently during the day.  Patient will speak with MD regarding how megace makes her feel.    MONITORING, EVALUATION, GOAL: weight trends, intake   NEXT VISIT: Tuesday, Dec 6 phone call  Annalese Stiner B. Zenia Resides, [place], [place] Registered Dietitian [phone] (mobile)

## 2021-02-08 ENCOUNTER — Other Ambulatory Visit: Payer: Self-pay

## 2021-02-08 ENCOUNTER — Ambulatory Visit
Admission: RE | Admit: 2021-02-08 | Discharge: 2021-02-08 | Disposition: A | Discharge status: Home / Self Care | Payer: Medicare HMO | Source: Ambulatory Visit | Attending: Internal Medicine | Admitting: Internal Medicine

## 2021-02-08 DIAGNOSIS — C250 Malignant neoplasm of head of pancreas: Secondary | ICD-10-CM | POA: Insufficient documentation

## 2021-02-08 IMAGING — CT CT ABD-PELV W/ CM
2 of 5 series · 14 of 46 positions shown, 16 images · IV contrast (omnipaque)
Comparison: CT abdomen and pelvis, [DATE], MR abdomen,
[DATE]

CLINICAL DATA: Pancreatic cancer restaging, assess treatment
response

EXAM:
CT ABDOMEN AND PELVIS WITH CONTRAST
TECHNIQUE: Multidetector CT imaging of the abdomen and pelvis was performed
using the standard protocol following bolus administration of
intravenous contrast.
CONTRAST:  75mL OMNIPAQUE IOHEXOL 300 MG/ML SOLN, additional oral
enteric contrast

[Series 2: abd pelvis 5.00 · axial · 0.60mm/px · z∈[-1524,-1139]mm · 11 of 87 slices shown, 13 images]
[im 5/87  soft-tissue]
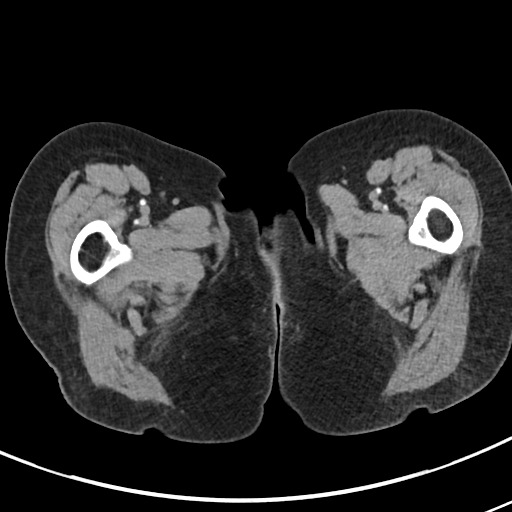
[im 5/87  bone]
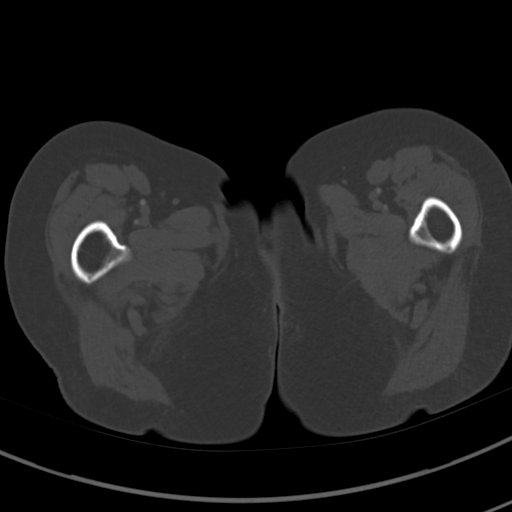
[im 14/87  soft-tissue]
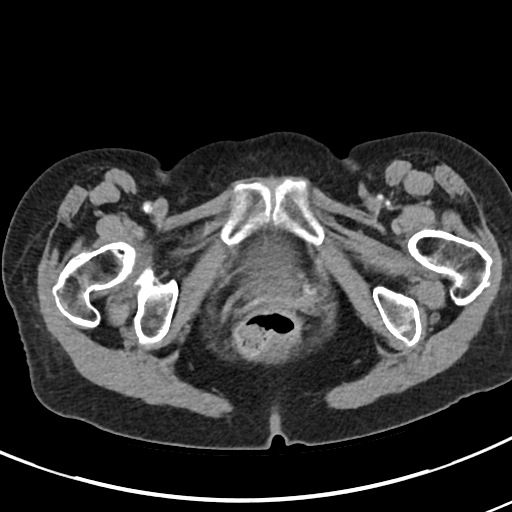
[im 23/87  soft-tissue]
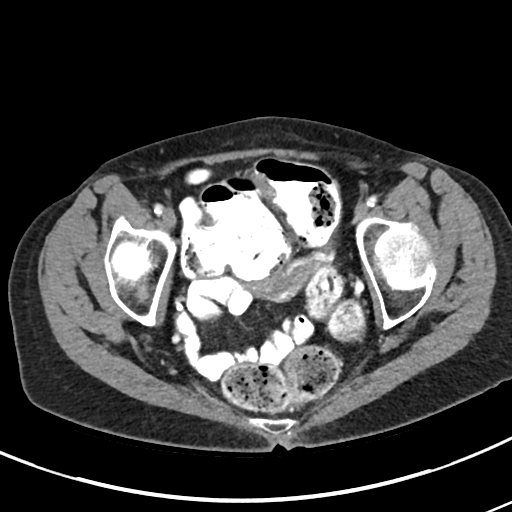
[im 28/87  soft-tissue]
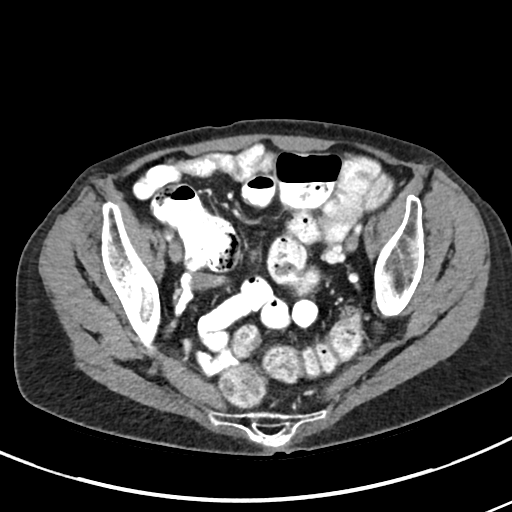
[im 37/87  soft-tissue]
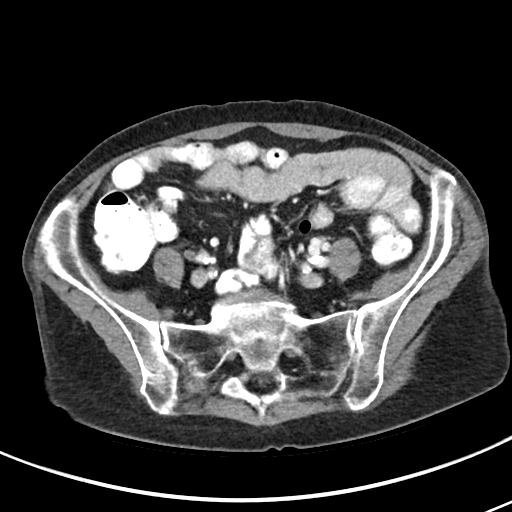
[im 46/87  soft-tissue]
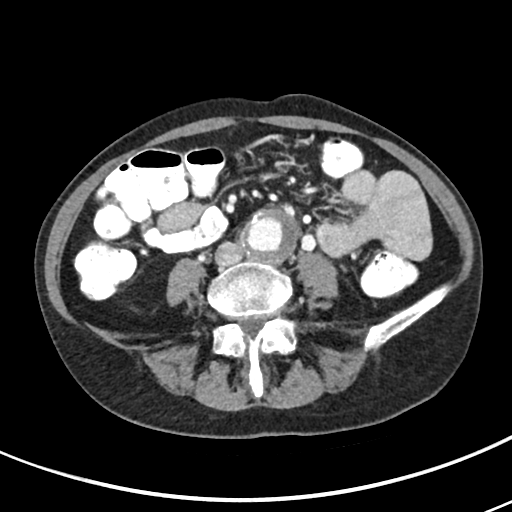
[im 50/87  soft-tissue]
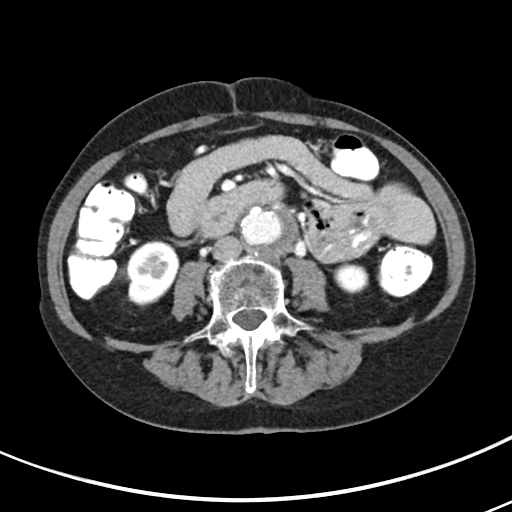
[im 59/87  soft-tissue]
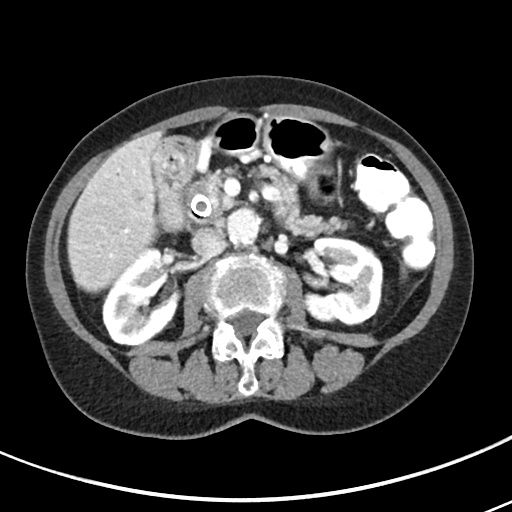
[im 64/87  soft-tissue]
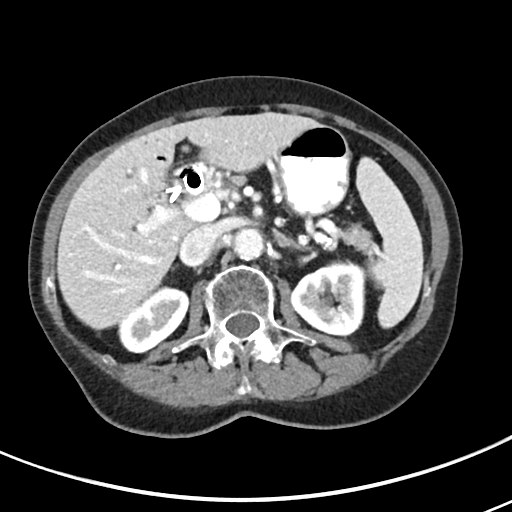
[im 64/87  bone]
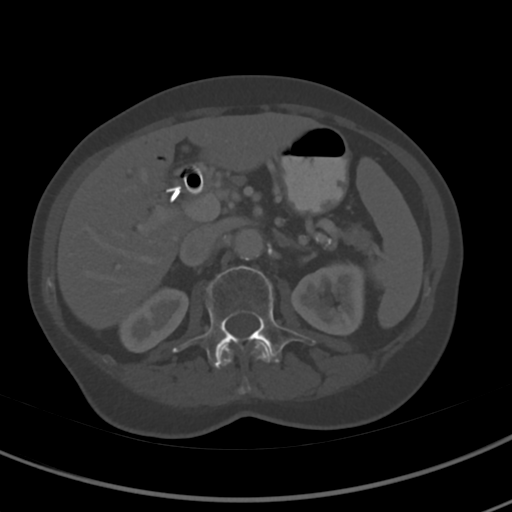
[im 73/87  soft-tissue]
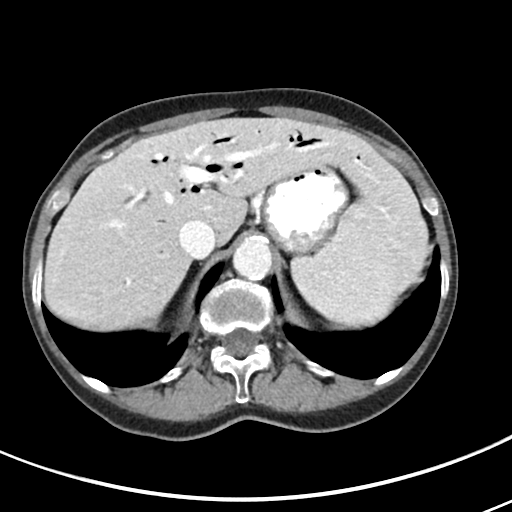
[im 82/87  soft-tissue]
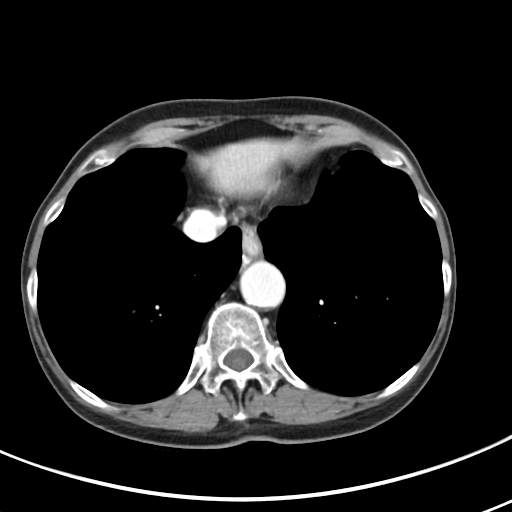

[Series 4: coronals abd pelvis 2.00 cor · coronal · 0.60mm/px · 3 of 116 slices shown]
[im 39/116  soft-tissue]
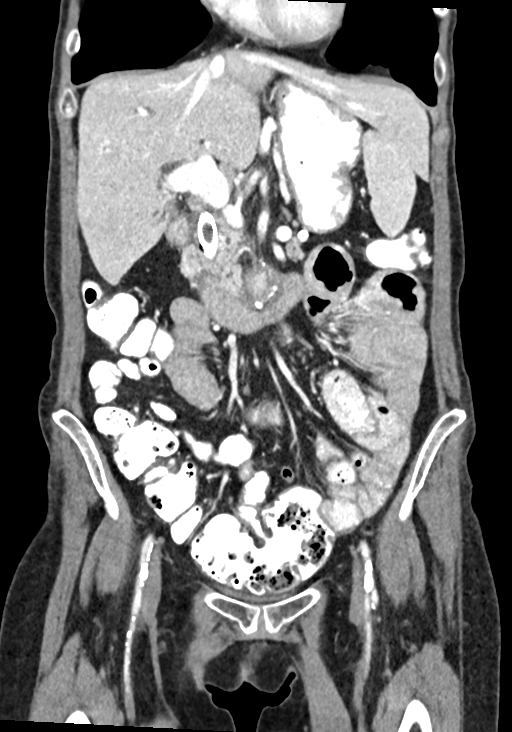
[im 52/116  soft-tissue]
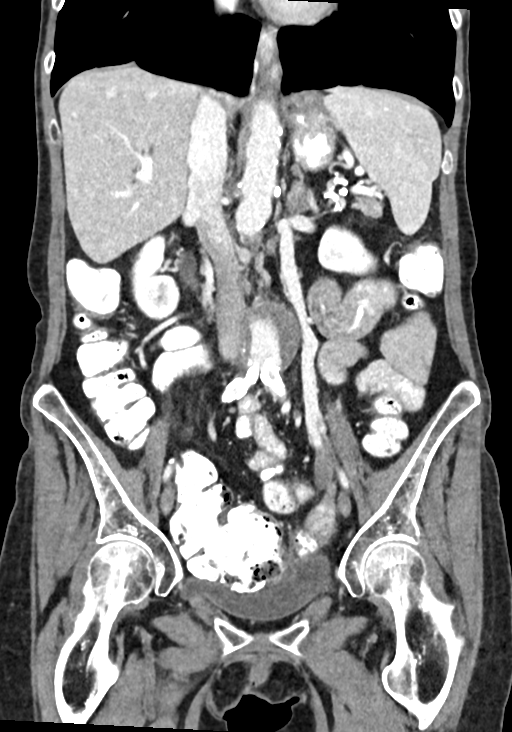
[im 64/116  soft-tissue]
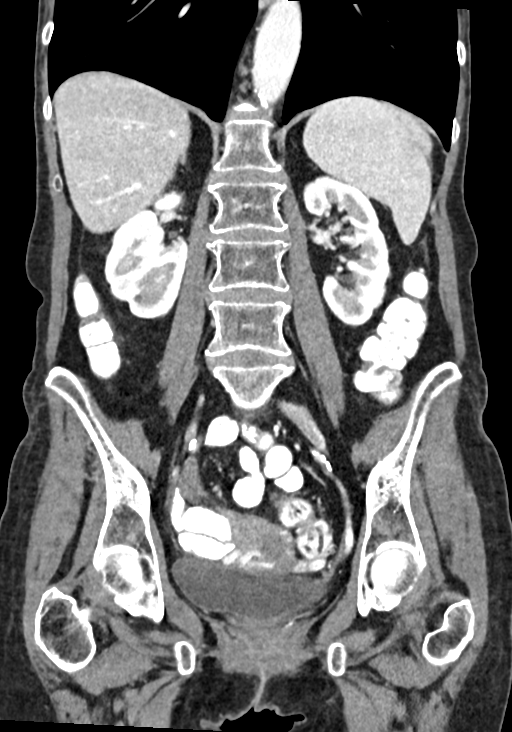

[14 of 46 positions shown; findings below may reference images not displayed]

FINDINGS: Lower chest: No acute abnormality.  Small hiatal hernia.  Emphysema.

Hepatobiliary: Previously identified foci of arterial contrast
hyperenhancement are only minimally appreciated on this single
portal venous phase examination, for example in the peripheral right
lobe of the liver, hepatic segment VI, measuring no greater than
cm (series 2, image 22) and in the anterior subcapsular right lobe
of the liver, hepatic segment V, measuring no greater than 0.5 cm
(series 2, image 15). Status post cholecystectomy. Common bile duct
stent remains in position with post stenting pneumobilia.

Pancreas: Hypodense lesion of the ventral pancreatic head is not
significantly changed, measuring 1.6 x 0.9 cm (series 2, image 29).
No pancreatic ductal dilatation or surrounding inflammatory changes.

Spleen: Normal in size without significant abnormality.

Adrenals/Urinary Tract: Adrenal glands are unremarkable. Kidneys are
normal, without renal calculi, solid lesion, or hydronephrosis.
Bladder is unremarkable.

Stomach/Bowel: Stomach is within normal limits. Appendix appears
normal. No evidence of bowel wall thickening, distention, or
inflammatory changes.

Vascular/Lymphatic: Aortic atherosclerosis. Unchanged aneurysm of
the infrarenal abdominal aorta, measuring up to 3.7 x 3.2 cm, with a
large burden of mural thrombus. Bilateral common iliac artery
stents. Redemonstrated focal narrowing of the portal vein within the
pancreatic head (series 2, image 25) with partial cavernous
transformation of the portal vein. Enlarged gastrohepatic ligament
and celiac axis lymph nodes are diminished in size, index
gastrohepatic ligament node measuring 1.0 x 0.6 cm, previously 1.3 x
0.9 cm (series 2, image 24).

Reproductive: No mass or other significant abnormality.

Other: No abdominal wall hernia or abnormality. No abdominopelvic
ascites.

Musculoskeletal: No acute or significant osseous findings.
IMPRESSION: 1. Hypodense lesion of the ventral pancreatic head is not
significantly changed.
2. Enlarged gastrohepatic ligament and celiac axis lymph nodes are
diminished in size, consistent with treatment response of nodal
metastases.
3. Previously identified foci of arterial contrast hyperenhancement
within the right lobe of the liver are only minimally appreciated on
this single portal venous phase examination, appearance likely
exquisitely sensitive to exact phase of contrast enhancement. Benign
perfusion variation remains generally favored, however continued
attention on follow-up is warranted.
4. Unchanged focal narrowing of the portal vein within the
pancreatic head with partial cavernous transformation of the portal
vein.
5. Status post common bile duct stenting.
6. Unchanged aneurysm of the infrarenal abdominal aorta, measuring
up to 3.7 x 3.2 cm, with a large burden of mural thrombus. Bilateral
common iliac artery stents. Recommend follow-up ultrasound every 2
years if not otherwise imaged. This recommendation follows ACR
consensus guidelines: White Paper of the ACR Incidental Findings
Committee II on Vascular Findings. [HOSPITAL] [TL];

Aortic Atherosclerosis ([TL]-[TL]) and Emphysema ([TL]-[TL]).

## 2021-02-08 MED ORDER — IOHEXOL 300 MG/ML  SOLN
75.0000 mL | Freq: Once | INTRAMUSCULAR | Status: AC | PRN
  Administered 2021-02-08: 75 mL via INTRAVENOUS

## 2021-02-13 ENCOUNTER — Inpatient Hospital Stay: Payer: Medicare HMO | Attending: Internal Medicine

## 2021-02-13 ENCOUNTER — Inpatient Hospital Stay (HOSPITAL_BASED_OUTPATIENT_CLINIC_OR_DEPARTMENT_OTHER): Payer: Medicare HMO | Admitting: Internal Medicine

## 2021-02-13 ENCOUNTER — Encounter: Payer: Self-pay | Admitting: Internal Medicine

## 2021-02-13 ENCOUNTER — Other Ambulatory Visit: Payer: Self-pay

## 2021-02-13 DIAGNOSIS — J449 Chronic obstructive pulmonary disease, unspecified: Secondary | ICD-10-CM | POA: Insufficient documentation

## 2021-02-13 DIAGNOSIS — F419 Anxiety disorder, unspecified: Secondary | ICD-10-CM | POA: Diagnosis not present

## 2021-02-13 DIAGNOSIS — K529 Noninfective gastroenteritis and colitis, unspecified: Secondary | ICD-10-CM | POA: Insufficient documentation

## 2021-02-13 DIAGNOSIS — I10 Essential (primary) hypertension: Secondary | ICD-10-CM | POA: Diagnosis not present

## 2021-02-13 DIAGNOSIS — C250 Malignant neoplasm of head of pancreas: Secondary | ICD-10-CM | POA: Insufficient documentation

## 2021-02-13 DIAGNOSIS — R7989 Other specified abnormal findings of blood chemistry: Secondary | ICD-10-CM | POA: Diagnosis not present

## 2021-02-13 DIAGNOSIS — Z87891 Personal history of nicotine dependence: Secondary | ICD-10-CM | POA: Insufficient documentation

## 2021-02-13 DIAGNOSIS — K831 Obstruction of bile duct: Secondary | ICD-10-CM | POA: Diagnosis not present

## 2021-02-13 LAB — CBC WITH DIFFERENTIAL/PLATELET
Abs Immature Granulocytes: 0.05 10*3/uL (ref 0.00–0.07)
Basophils Absolute: 0 10*3/uL (ref 0.0–0.1)
Basophils Relative: 0 %
Eosinophils Absolute: 0.1 10*3/uL (ref 0.0–0.5)
Eosinophils Relative: 1 %
HCT: 48.1 % — ABNORMAL HIGH (ref 36.0–46.0)
Hemoglobin: 15.8 g/dL — ABNORMAL HIGH (ref 12.0–15.0)
Immature Granulocytes: 1 %
Lymphocytes Relative: 15 %
Lymphs Abs: 1.5 10*3/uL (ref 0.7–4.0)
MCH: 30.4 pg (ref 26.0–34.0)
MCHC: 32.8 g/dL (ref 30.0–36.0)
MCV: 92.5 fL (ref 80.0–100.0)
Monocytes Absolute: 0.8 10*3/uL (ref 0.1–1.0)
Monocytes Relative: 8 %
Neutro Abs: 7.1 10*3/uL (ref 1.7–7.7)
Neutrophils Relative %: 75 %
Platelets: 215 10*3/uL (ref 150–400)
RBC: 5.2 MIL/uL — ABNORMAL HIGH (ref 3.87–5.11)
RDW: 13.6 % (ref 11.5–15.5)
WBC: 9.5 10*3/uL (ref 4.0–10.5)
nRBC: 0 % (ref 0.0–0.2)

## 2021-02-13 LAB — COMPREHENSIVE METABOLIC PANEL
ALT: 14 U/L (ref 0–44)
AST: 25 U/L (ref 15–41)
Albumin: 4.7 g/dL (ref 3.5–5.0)
Alkaline Phosphatase: 67 U/L (ref 38–126)
Anion gap: 9 (ref 5–15)
BUN: 14 mg/dL (ref 8–23)
CO2: 27 mmol/L (ref 22–32)
Calcium: 9.6 mg/dL (ref 8.9–10.3)
Chloride: 100 mmol/L (ref 98–111)
Creatinine, Ser: 0.63 mg/dL (ref 0.44–1.00)
GFR, Estimated: >60 mL/min (ref >60)
Glucose, Bld: 65 mg/dL — ABNORMAL LOW (ref 70–99)
Potassium: 4.2 mmol/L (ref 3.5–5.1)
Sodium: 136 mmol/L (ref 135–145)
Total Bilirubin: 0.7 mg/dL (ref 0.3–1.2)
Total Protein: 8.8 g/dL — ABNORMAL HIGH (ref 6.5–8.1)

## 2021-02-13 MED ORDER — ALPRAZOLAM 0.25 MG PO TABS: 0.2500 mg | ORAL_TABLET | Freq: Two times a day (BID) | ORAL | 0 refills | Status: DC | PRN

## 2021-02-13 MED ORDER — HYDROCODONE-ACETAMINOPHEN 5-325 MG PO TABS
1.0000 | ORAL_TABLET | Freq: Two times a day (BID) | ORAL | 0 refills | Status: DC | PRN
Start: 2021-02-13 — End: 2021-03-20

## 2021-02-13 NOTE — Progress Notes (Signed)
[place] NOTE  Patient Care Team: Erma Heritage, MD as PCP - General (Family Medicine) Rico Junker, RN as Registered Nurse Theodore Demark, RN as Registered Nurse Ezzard Standing, PA-C as Physician Assistant (Gastroenterology) Bary Castilla, Forest Gleason, MD (General Surgery) Clent Jacks, RN as Oncology Nurse Navigator Cammie Sickle, MD as Consulting Physician (Hematology and Oncology)  CHIEF COMPLAINTS/PURPOSE OF CONSULTATION: Pancreatic cancer  #  Oncology History Overview Note  # MAY 2022-pancreatic adenocarcinoma [obstructive jaundice-]MRI-18 mm pancreatic head mass; EUS [[facility]; Dr.Spaete]-abutment of portal vein/SMV; no lymphadenopathy noted; May 5 CA 19-9-163; s/p stenting -May 25th-47.   #Obstructive jaundice-s/p ERCP stenting [Dr.Wohl]  MAY 2022- ENDOSONOGRAPHIC FINDING: : A round mass was identified in the pancreatic head.  The mass was hypoechoic. The mass measured 16 mm by 17  mm in maximal cross-sectional diameter. The outer  margins were irregular. There was sonographic evidence  suggesting invasion into the portal vein (manifested  by abutment) and the superior mesenteric vein  (manifested by abutment).   #Pancreatic adenocarcinoma pT1 however on EUS/CT [[facility]. Dr.Shah]-concerning for tumor abutting the celiac axis /common hepatic artery /portal vein/superior mesenteric vein; without any metastatic disease.  Borderline resectable Elevated CA 19-9.  [S/p evaluation at [place]  # June 9th- FOLFIRINOX cycle #1; discontinued because of poor GI tolerance  # &/01-2021-gemcitabine Abraxane [100mg /2] day 1 day 815  # July 11th-gemcitabine and Abraxane day.  November 15, 2020-CT scan progressive disease/poor tolerance to therapy  # AUG 29th, 2022-gemcitabine single agent weekly-concurrent radiation;  December 12, 2020-discontinued gemcitabine single agent because of significant respiratory distress post gemcitabine infusion.   Continue local radiation ---------------------------------------------------------      Malignant neoplasm of head of pancreas ([place])  08/24/2020 Initial Diagnosis   Malignant neoplasm of head of pancreas ([place])   09/09/2020 - 09/12/2020 Chemotherapy          10/10/2020 -  Chemotherapy    Patient is on Treatment Plan: PANCREATIC ABRAXANE / GEMCITABINE D1,8,15 Q28D          HISTORY OF PRESENTING ILLNESS: Caucasian female patient ambulating independently.  Accompanied by her son.  Shirley Barton 70 y.o.  female with a history of COPD; chronic abdominal pain-pancreatic cancer with poor tolerance to  chemotherapy-lmetastatic disease-currently on radiation to the pancreas on a palliative basis is here for follow-up/review results of the CT scan..  Patient finished radiation approximately a month ago.  Has intermittent abdominal pain which is currently resolved.  She is needing to take narcotic pain medication only as needed.  Complains of hot flashes.  Appetite is fair.  No worsening nausea vomiting.  Review of Systems  Constitutional:  Positive for malaise/fatigue and weight loss. Negative for chills, diaphoresis and fever.  HENT:  Negative for nosebleeds and sore throat.   Eyes:  Negative for double vision.  Respiratory:  Positive for shortness of breath. Negative for cough, hemoptysis, sputum production and wheezing.   Cardiovascular:  Negative for chest pain, palpitations, orthopnea and leg swelling.  Gastrointestinal:  Positive for abdominal pain and diarrhea. Negative for blood in stool, constipation, heartburn, melena and vomiting.  Genitourinary:  Negative for dysuria, frequency and urgency.  Musculoskeletal:  Negative for back pain and joint pain.  Skin: Negative.  Negative for itching and rash.  Neurological:  Negative for dizziness, tingling, focal weakness, weakness and headaches.  Endo/Heme/Allergies:  Does not bruise/bleed easily.  Psychiatric/Behavioral:  Negative for  depression. The patient is not nervous/anxious and does not have  insomnia.     MEDICAL HISTORY:  Past Medical History:  Diagnosis Date   Abdominal pain, generalized    Anxiety    Atypical chest pain    Back pain    Basal cell carcinoma of skin 28-Jun-2011   resected from Left scalp area.    COPD (chronic obstructive pulmonary disease) (HCC)    Depression    DOE (dyspnea on exertion)    GERD (gastroesophageal reflux disease)    Hypertension    Pancreatic cancer ([place])    Shortness of breath dyspnea     SURGICAL HISTORY: Past Surgical History:  Procedure Laterality Date   BASAL CELL CARCINOMA EXCISION  06/28/11   CARPAL TUNNEL RELEASE Left 90s   CHOLECYSTECTOMY N/A 09/16/2015   Procedure: LAPAROSCOPIC CHOLECYSTECTOMY WITH INTRAOPERATIVE CHOLANGIOGRAM;  Surgeon: Robert Bellow, MD;  Location: [facility] ORS;  Service: General;  Laterality: N/A;   COLONOSCOPY  1990"s   [place]   ENDOSCOPIC RETROGRADE CHOLANGIOPANCREATOGRAPHY (ERCP) WITH PROPOFOL N/A 11/10/2020   Procedure: ENDOSCOPIC RETROGRADE CHOLANGIOPANCREATOGRAPHY (ERCP) WITH PROPOFOL;  Surgeon: Lucilla Lame, MD;  Location: [facility] ENDOSCOPY;  Service: Endoscopy;  Laterality: N/A;   ERCP N/A 08/05/2020   Procedure: ENDOSCOPIC RETROGRADE CHOLANGIOPANCREATOGRAPHY (ERCP);  Surgeon: Lucilla Lame, MD;  Location: [facility] ENDOSCOPY;  Service: Endoscopy;  Laterality: N/A;   ESOPHAGOGASTRODUODENOSCOPY (EGD) WITH PROPOFOL N/A 02/05/2017   Procedure: ESOPHAGOGASTRODUODENOSCOPY (EGD) WITH PROPOFOL;  Surgeon: Lollie Sails, MD;  Location: [facility] ENDOSCOPY;  Service: Endoscopy;  Laterality: N/A;   PERIPHERAL VASCULAR CATHETERIZATION Bilateral 12/27/2015   Procedure: Lower Extremity Angiography;  Surgeon: Katha Cabal, MD;  Location: [facility] CV LAB;  Service: Cardiovascular;  Laterality: Bilateral;   PORTA CATH INSERTION N/A 09/08/2020   Procedure: PORTA CATH INSERTION;  Surgeon: Algernon Huxley, MD;  Location: [facility] LAB;  Service:  Cardiovascular;  Laterality: N/A;   TONSILLECTOMY      SOCIAL HISTORY: Social History   Socioeconomic History   Marital status: Divorced    Spouse name: Not on file   Number of children: Not on file   Years of education: Not on file   Highest education level: Not on file  Occupational History   Occupation: retired  Tobacco Use   Smoking status: Former    Types: Cigarettes    Quit date: 03/02/2012    Years since quitting: 8.9   Smokeless tobacco: Never  Vaping Use   Vaping Use: Never used  Substance and Sexual Activity   Alcohol use: No    Alcohol/week: 0.0 standard drinks   Drug use: No   Sexual activity: Not on file  Other Topics Concern   Not on file  Social History Narrative   [place]; self; smoker- quit 2011-06-28; daughter died of leukemia-2020. 2 sons [[place]; [place]]; used to be caregiver/ retd.    Social Determinants of Health   Financial Resource Strain: Not on file  Food Insecurity: Not on file  Transportation Needs: Not on file  Physical Activity: Not on file  Stress: Not on file  Social Connections: Not on file  Intimate Partner Violence: Not on file    FAMILY HISTORY: Family History  Problem Relation Age of Onset   Cerebral palsy Daughter    Colon polyps Sister    Leukemia Daughter    Depression Son    Breast cancer Neg Hx     ALLERGIES:  is allergic to tramadol and penicillins.  MEDICATIONS:  Current Outpatient Medications  Medication Sig Dispense Refill   acetaminophen (TYLENOL) 325 MG tablet Take 650  mg by mouth every 6 (six) hours as needed for mild pain or moderate pain.     B Complex-C-Folic Acid (B COMPLEX-VITAMIN C-FOLIC ACID) 1 MG tablet Take 1 tablet by mouth daily with breakfast. 30 tablet 0   Cetirizine HCl (ZYRTEC ALLERGY PO) Take 1 tablet by mouth daily as needed (allergies).     diphenoxylate-atropine (LOMOTIL) 2.5-0.025 MG tablet Take 1 tablet by mouth 4 (four) times daily as needed for diarrhea or loose stools. 60 tablet 1    Fluticasone-Salmeterol (ADVAIR) 100-50 MCG/DOSE AEPB Inhale 1 puff into the lungs 2 (two) times daily.     ipratropium-albuterol (DUONEB) 0.5-2.5 (3) MG/3ML SOLN Take 3 mLs by nebulization every 4 (four) hours as needed. 360 mL 0   KLOR-CON M20 20 MEQ tablet TAKE 1 TABLET BY MOUTH TWICE A DAY 60 tablet 0   levalbuterol (XOPENEX HFA) 45 MCG/ACT inhaler Inhale 1-2 puffs into the lungs every 6 (six) hours as needed for wheezing or shortness of breath. 1 each 12   lidocaine-prilocaine (EMLA) cream Apply 30 -45 mins prior to port access. 30 g 0   magic mouthwash w/lidocaine SOLN Take 10 mLs by mouth every 2 (two) hours as needed for mouth pain. 360 mL 0   megestrol (MEGACE ES) 625 MG/5ML suspension Take 5 mLs (625 mg total) by mouth daily. 150 mL 0   ondansetron (ZOFRAN) 8 MG tablet One pill every 8 hours as needed for nausea/vomitting. 40 tablet 3   pantoprazole (PROTONIX) 40 MG tablet Take 1 tablet (40 mg total) by mouth daily. 90 tablet 0   prochlorperazine (COMPAZINE) 10 MG tablet Take 1 tablet (10 mg total) by mouth every 6 (six) hours as needed for nausea or vomiting. 40 tablet 3   ALPRAZolam (XANAX) 0.25 MG tablet Take 1 tablet (0.25 mg total) by mouth every 12 (twelve) hours as needed for anxiety. 45 tablet 0   HYDROcodone-acetaminophen (NORCO) 5-325 MG tablet Take 1 tablet by mouth every 12 (twelve) hours as needed for moderate pain. 45 tablet 0   No current facility-administered medications for this visit.      Marland Kitchen  PHYSICAL EXAMINATION: ECOG PERFORMANCE STATUS: 0 - Asymptomatic  Vitals:   02/13/21 1036  BP: (!) 153/106  Pulse: (!) 110  Resp: 18  Temp: 97.8 F (36.6 C)  SpO2: 100%    Filed Weights   02/13/21 1036  Weight: 98 lb (44.5 kg)     Physical Exam HENT:     Head: Normocephalic and atraumatic.     Mouth/Throat:     Pharynx: No oropharyngeal exudate.  Eyes:     Pupils: Pupils are equal, round, and reactive to light.  Cardiovascular:     Rate and Rhythm: Normal  rate and regular rhythm.  Pulmonary:     Effort: No respiratory distress.     Breath sounds: No wheezing.     Comments: Decreased air entry bilaterally.  No wheeze or crackles Abdominal:     General: Bowel sounds are normal. There is no distension.     Palpations: Abdomen is soft. There is no mass.     Tenderness: There is no abdominal tenderness. There is no guarding or rebound.  Musculoskeletal:        General: No tenderness. Normal range of motion.     Cervical back: Normal range of motion and neck supple.  Skin:    General: Skin is warm.  Neurological:     Mental Status: She is alert and oriented to person, place, and  time.  Psychiatric:        Mood and Affect: Affect normal.     LABORATORY DATA:  I have reviewed the data as listed Lab Results  Component Value Date   WBC 9.5 02/13/2021   HGB 15.8 (H) 02/13/2021   HCT 48.1 (H) 02/13/2021   MCV 92.5 02/13/2021   PLT 215 02/13/2021   Recent Labs    08/04/20 1138 08/06/20 0457 12/26/20 0945 01/16/21 0843 02/13/21 0955  NA  --    < > 135 137 136  K  --    < > 3.5 3.6 4.2  CL  --    < > 102 103 100  CO2  --    < > 26 25 27   GLUCOSE  --    < > 86 79 65*  BUN  --    < > 5* 9 14  CREATININE  --    < > 0.48 0.51 0.63  CALCIUM  --    < > 8.8* 9.5 9.6  GFRNONAA  --    < > >60 >60 >60  PROT  --    < > 7.2 7.0 8.8*  ALBUMIN  --    < > 3.7 3.7 4.7  AST  --    < > 27 19 25   ALT  --    < > 19 12 14   ALKPHOS  --    < > 64 46 67  BILITOT  --    < > 0.7 0.6 0.7  BILIDIR 3.7*  --   --   --   --    < > = values in this interval not displayed.    RADIOGRAPHIC STUDIES: I have personally reviewed the radiological images as listed and agreed with the findings in the report. CT ABDOMEN PELVIS W CONTRAST  Result Date: 02/09/2021 CLINICAL DATA:  Pancreatic cancer restaging, assess treatment response EXAM: CT ABDOMEN AND PELVIS WITH CONTRAST TECHNIQUE: Multidetector CT imaging of the abdomen and pelvis was performed using the  standard protocol following bolus administration of intravenous contrast. CONTRAST:  36mL OMNIPAQUE IOHEXOL 300 MG/ML SOLN, additional oral enteric contrast COMPARISON:  CT abdomen and pelvis, 11/15/2020, MR abdomen, 11/22/2020 FINDINGS: Lower chest: No acute abnormality.  Small hiatal hernia.  Emphysema. Hepatobiliary: Previously identified foci of arterial contrast hyperenhancement are only minimally appreciated on this single portal venous phase examination, for example in the peripheral right lobe of the liver, hepatic segment VI, measuring no greater than 0.5 cm (series 2, image 22) and in the anterior subcapsular right lobe of the liver, hepatic segment V, measuring no greater than 0.5 cm (series 2, image 15). Status post cholecystectomy. Common bile duct stent remains in position with post stenting pneumobilia. Pancreas: Hypodense lesion of the ventral pancreatic head is not significantly changed, measuring 1.6 x 0.9 cm (series 2, image 29). No pancreatic ductal dilatation or surrounding inflammatory changes. Spleen: Normal in size without significant abnormality. Adrenals/Urinary Tract: Adrenal glands are unremarkable. Kidneys are normal, without renal calculi, solid lesion, or hydronephrosis. Bladder is unremarkable. Stomach/Bowel: Stomach is within normal limits. Appendix appears normal. No evidence of bowel wall thickening, distention, or inflammatory changes. Vascular/Lymphatic: Aortic atherosclerosis. 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 Bilateral common iliac artery stents. 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 Enlarged gastrohepatic ligament and celiac axis lymph  nodes are diminished in size, index gastrohepatic ligament node measuring 1.0 x 0.6 cm, previously 1.3 x 0.9 cm (series 2, image 24).  Reproductive: No mass or other significant abnormality. Other: No abdominal wall hernia or abnormality. No abdominopelvic ascites. Musculoskeletal: No acute or significant osseous findings. IMPRESSION: 1. Hypodense lesion of the ventral pancreatic head is not significantly changed. 2. Enlarged gastrohepatic ligament and celiac axis lymph nodes are diminished in size, consistent with treatment response of nodal metastases. 3. Previously identified foci of arterial contrast hyperenhancement within the right lobe of the liver are only minimally appreciated on this single portal venous phase examination, appearance likely exquisitely sensitive to exact phase of contrast enhancement. Benign perfusion variation remains generally favored, however continued attention on follow-up is warranted. 4. 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 5. Status post common bile duct stenting. 6. 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 Bilateral common iliac artery stents. Recommend follow-up ultrasound every 2 years if not otherwise imaged. This recommendation follows ACR consensus guidelines: White Paper of the ACR Incidental Findings Committee II on Vascular Findings. J Am Coll Radiol 2013; 10:789-794. Aortic Atherosclerosis (ICD10-I70.0) and Emphysema (ICD10-J43.9). Electronically Signed   By: Delanna Ahmadi M.D.   On: 02/09/2021 08:56     ASSESSMENT & PLAN:   Malignant neoplasm of head of pancreas [facility]) # Pancreatic adenocarcinoma/locally advanced/metastatic disease [question liver lesions; abdominal/retroperitoneal adenopathy-CT scan November 15, 2020]. Currently s/p palliative radiation to the pancreas;  currently s/p  Radiation [10/11]. NOV 9th CT-  Hypodense lesion of the ventral pancreatic head is not significantly changed;  Enlarged gastrohepatic ligament  and celiac axis lymph nodes are diminished in size, consistent with treatment response of nodal metastases; liver lesions not well visualized [?  Artifact versus others].  #Given the overall stability of the disease/and poor tolerance of systemic therapy I think reasonable to continue surveillance at this time.  We will reevaluate with a CT scan in 3 months.  Further options include 5-FU-Onivyde.    # weight loss: Recommend continue  Megace- stable; refilled   # COPD- on Advair/  xopenex prn- stable;   # Chronic Diarrhea/adominal pain - on nocro qhs prn- stable;   #Recurrent biliary obstruction/elevated LFTs-s/p repeat ERCP [AUG 11th, Dr.Wohl]-stable;   #Incidental findings on CT Imaging dated: 11/09, 2022: Aneurysm; emphysema atherosclerosis I reviewed/discussed/counseled the patient. Will check with vascular.  # Anxiety: continue xanax - 1 q 12 hours prn/refilled.   # DISPOSITION:my chart # port flush- in  35month # Follow up in 3  month- MD; labs- cbc/cmp;ca-19-9; port flush-CT scan- [facility] Dr.B  # I reviewed the blood work- with the patient in detail; also reviewed the imaging independently [as summarized above]; and with the patient in detail.      All questions were answered. The patient knows to call the clinic with any problems, questions or concerns.    Cammie Sickle, MD 02/13/2021 11:50 AM

## 2021-02-13 NOTE — Progress Notes (Signed)
Patient here for oncology follow-up appointment, concerns of headaches and night sweats

## 2021-02-13 NOTE — Progress Notes (Signed)
I spoke to patient regarding my discussion with vascular surgery with regards to abdominal aortic aneurysm. Recommend evaluation with vascular surgery/Dr. Lucky Cowboy. The patient is in agreement.  [place] - please make a referral to Dr. Lucky Cowboy Re: abdominal aneurysm.

## 2021-02-13 NOTE — Assessment & Plan Note (Addendum)
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 Currently s/p palliative radiation to the pancreas;  currently s/p  Radiation [10/11]. NOV 9th CT-  Hypodense lesion of the ventral pancreatic head is not significantly changed;  Enlarged gastrohepatic ligament and celiac axis lymph nodes are diminished in size, consistent with treatment response of nodal metastases; liver lesions not well visualized [?  Artifact versus others].  #Given the overall stability of the disease/and poor tolerance of systemic therapy I think reasonable to continue surveillance at this time.  We will reevaluate with a CT scan in 3 months.  Further options include 5-FU-Onivyde.   # weight loss: Recommend continue  Megace- stable; refilled   # COPD- on Advair/  xopenex prn- stable;   # Chronic Diarrhea/adominal pain - on nocro qhs prn- stable;   #Recurrent biliary obstruction/elevated LFTs-s/p repeat ERCP [AUG 11th, Dr.Wohl]-stable;   #Incidental findings on CT Imaging dated: 11/09, 2022: Aneurysm; emphysema atherosclerosis I reviewed/discussed/counseled the patient. Will check with vascular.  # Anxiety: continue xanax - 1 q 12 hours prn/refilled.   # DISPOSITION:my chart # port flush- in  16month # Follow up in 3  month- MD; labs- cbc/cmp;ca-19-9; port flush-CT scan- [facility] Dr.B  # I reviewed the blood work- with the patient in detail; also reviewed the imaging independently [as summarized above]; and with the patient in detail.

## 2021-02-14 LAB — CANCER ANTIGEN 19-9: CA 19-9: 15 U/mL (ref 0–35)

## 2021-02-17 ENCOUNTER — Telehealth: Payer: Self-pay

## 2021-02-17 ENCOUNTER — Ambulatory Visit
Admission: RE | Admit: 2021-02-17 | Discharge: 2021-02-17 | Disposition: A | Discharge status: Home / Self Care | Payer: Medicare HMO | Source: Ambulatory Visit | Attending: Radiation Oncology | Admitting: Radiation Oncology

## 2021-02-17 ENCOUNTER — Encounter: Payer: Self-pay | Admitting: Radiation Oncology

## 2021-02-17 VITALS — BP 168/108 | HR 105 | Temp 97.3°F | Wt 100.4 lb

## 2021-02-17 DIAGNOSIS — C250 Malignant neoplasm of head of pancreas: Secondary | ICD-10-CM | POA: Diagnosis present

## 2021-02-17 DIAGNOSIS — Z923 Personal history of irradiation: Secondary | ICD-10-CM | POA: Insufficient documentation

## 2021-02-17 DIAGNOSIS — I7143 Infrarenal abdominal aortic aneurysm, without rupture: Secondary | ICD-10-CM | POA: Diagnosis not present

## 2021-02-17 NOTE — Progress Notes (Signed)
Radiation Oncology Follow up Note  Name: Shirley Barton   Date:   02/17/2021 MRN:  [id number] DOB: Sep 25, 1950    This 70 y.o. female presents to the clinic today for 1 month follow-up status post IMRT radiation therapy to her pancreas for stage I adenocarcinoma.  REFERRING PROVIDER: Erma Heritage, MD  HPI: Patient is a 70 year old female now at 1 month having completed external beam radiation therapy to her pancreas for presumed stage I adenocarcinoma..  She is seen today in routine follow-up.  She still is having some abdominal discomfort.  No diarrhea.  She had a CT scan this month showing hypodense lesion in the ventral pancreatic head not significantly changed the enlarged gastropathic ligament and celiac's lymph nodes are diminished in size consistent with treatment response.  She does have some abnormality in her liver although thought to be benign.  She is also been referred to vascular surgeon for an unchanged 3.7 sonometer infrarenal abdominal aortic aneurysm.  Her most recent CA 19-9 has continued to go down was 15 4 days ago down from over 104 months ago.  COMPLICATIONS OF TREATMENT: none  FOLLOW UP COMPLIANCE: keeps appointments   PHYSICAL EXAM:  BP (!) 168/108   Pulse (!) 105   Temp (!) 97.3 F (36.3 C) (Tympanic)   Wt 100 lb 6.4 oz (45.5 kg)   LMP 01/03/1995 (Approximate)   BMI 17.23 kg/m  Well-developed well-nourished patient in NAD. HEENT reveals PERLA, EOMI, discs not visualized.  Oral cavity is clear. No oral mucosal lesions are identified. Neck is clear without evidence of cervical or supraclavicular adenopathy. Lungs are clear to A&P. Cardiac examination is essentially unremarkable with regular rate and rhythm without murmur rub or thrill. Abdomen is benign with no organomegaly or masses noted. Motor sensory and DTR levels are equal and symmetric in the upper and lower extremities. Cranial nerves II through XII are grossly intact. Proprioception is intact. No  peripheral adenopathy or edema is identified. No motor or sensory levels are noted. Crude visual fields are within normal range.  RADIOLOGY RESULTS: CT scan reviewed compatible with above-stated findings  PLAN: Present time all evidence points based on her CA 19-9 and CT findings to have a had a good treatment response.  I am pleased with her overall progress.  I have asked to see her back in 4 to 5 months for follow-up.  She is currently under observation with no systemic treatment.  I have asked to see her back in 4 to 5 months for follow-up.  Patient knows to call with any concerns.  I would like to take this opportunity to thank you for allowing me to participate in the care of your patient.Noreene Filbert, MD

## 2021-02-17 NOTE — Telephone Encounter (Addendum)
Referral to Dr. Lucky Cowboy for abdominal aneurysm evaluaiton entered in EPIC.  Patient informed that she should be receiving a call from vascular surgery office to schedule appointment.

## 2021-02-24 ENCOUNTER — Other Ambulatory Visit: Payer: Self-pay | Admitting: Internal Medicine

## 2021-02-27 ENCOUNTER — Encounter: Payer: Self-pay | Admitting: Internal Medicine

## 2021-03-07 ENCOUNTER — Inpatient Hospital Stay: Payer: Medicare HMO | Attending: Internal Medicine

## 2021-03-07 ENCOUNTER — Inpatient Hospital Stay (HOSPITAL_BASED_OUTPATIENT_CLINIC_OR_DEPARTMENT_OTHER): Payer: Medicare HMO | Admitting: Nurse Practitioner

## 2021-03-07 ENCOUNTER — Other Ambulatory Visit: Payer: Self-pay

## 2021-03-07 DIAGNOSIS — C250 Malignant neoplasm of head of pancreas: Secondary | ICD-10-CM

## 2021-03-07 DIAGNOSIS — Z806 Family history of leukemia: Secondary | ICD-10-CM

## 2021-03-07 DIAGNOSIS — L299 Pruritus, unspecified: Secondary | ICD-10-CM

## 2021-03-07 DIAGNOSIS — Z87891 Personal history of nicotine dependence: Secondary | ICD-10-CM | POA: Diagnosis not present

## 2021-03-07 MED ORDER — HYDROXYZINE HCL 25 MG PO TABS: 25.0000 mg | ORAL_TABLET | Freq: Three times a day (TID) | ORAL | 0 refills | Status: DC | PRN

## 2021-03-07 NOTE — Progress Notes (Signed)
Nutrition Follow-up:  Patient with pancreatic cancer.  Patient has completed radiation and currently on surveillance.    Spoke with patient by phone.  Patient reports that her appetite is better.  "I am eating everything and anything."  Drinking Boost VHC shakes about 2 times per day.  Also eating solid foods as well (spaghetti recently).  Had barbecue yesterday and mushrooms.    Says that she is itching all over.  Noticed that the hair on her head, arms and legs are starting to grow back and unsure if this is what is causing the itching.      Medications: reviewed  Labs: reviewed  Anthropometrics:   Weight 100 lb 6.4 oz on 11/18  97 lb 3.2 oz on 10/17  92 lb on 8/15 98 lb on 7/11 100 lb on 6/20   NUTRITION DIAGNOSIS: Inadequate oral intake improving   INTERVENTION:  Encouraged patient to continue Boost VHC at least BID Encouraged high calorie, high protein foods to continue weight gain Message sent to Dr B and team regarding patient's itching.       MONITORING, EVALUATION, GOAL: weight trends, intake   NEXT VISIT: phone visit on Tuesday, Feb 21  Garion Wempe B. Zenia Resides, [place], [place] Registered Dietitian [phone] (mobile)

## 2021-03-07 NOTE — Progress Notes (Signed)
Virtual Visit Progress Note  [name] [place]. [facility] [address], [place], [place] [address] [phone] (phone) [phone] (fax)  I connected with Shirley Barton on 03/07/21 at  3:15 PM EST by video enabled telemedicine visit and verified that I am speaking with the correct person using two identifiers.   I discussed the limitations, risks, security and privacy concerns of performing an evaluation and management service by telemedicine and the availability of in-person appointments. I also discussed with the patient that there may be a patient responsible charge related to this service. The patient expressed understanding and agreed to proceed.   Other persons participating in the visit and their role in the encounter: none   Patient's location: home  Provider's location: home   Chief Complaint: itching    Patient Care Team: Erma Heritage, MD as PCP - General (Family Medicine) Rico Junker, RN as Registered Nurse Theodore Demark, RN as Registered Nurse Ezzard Standing, PA-C as Physician Assistant (Gastroenterology) Bary Castilla, Forest Gleason, MD (General Surgery) Clent Jacks, RN as Oncology Nurse Navigator Cammie Sickle, MD as Consulting Physician (Hematology and Oncology)   Name of the patient: Shirley Barton  [id number]  1950-06-25   Date of visit: 03/07/21  Diagnosis- Pancreatic Cancer  Heme/Onc history:  Oncology History Overview Note  # MAY 2022-pancreatic adenocarcinoma [obstructive jaundice-]MRI-18 mm pancreatic head mass; EUS [[facility]; Dr.Spaete]-abutment of portal vein/SMV; no lymphadenopathy noted; May 5 CA 19-9-163; s/p stenting -May 25th-47.   #Obstructive jaundice-s/p ERCP stenting [Dr.Wohl]  MAY 2022- ENDOSONOGRAPHIC FINDING: : A round mass was identified in the pancreatic head.  The mass was hypoechoic. The mass measured 16 mm  by 17  mm in maximal cross-sectional diameter. The outer  margins were irregular. There was sonographic evidence  suggesting invasion into the portal vein (manifested  by abutment) and the superior mesenteric vein  (manifested by abutment).   #Pancreatic adenocarcinoma pT1 however on EUS/CT [[facility]. Dr.Shah]-concerning for tumor abutting the celiac axis /common hepatic artery /portal vein/superior mesenteric vein; without any metastatic disease.  Borderline resectable Elevated CA 19-9.  [S/p evaluation at [facility]  # June 9th- FOLFIRINOX cycle #1; discontinued because of poor GI tolerance  # &/01-2021-gemcitabine Abraxane [100mg /2] day 1 day 815  # July 11th-gemcitabine and Abraxane day.  November 15, 2020-CT scan progressive disease/poor tolerance to therapy  # AUG 29th, 2022-gemcitabine single agent weekly-concurrent radiation;  December 12, 2020-discontinued gemcitabine single agent because of significant respiratory distress post gemcitabine infusion.  Continue local radiation ---------------------------------------------------------      Malignant neoplasm of head of pancreas ([place])  08/24/2020 Initial Diagnosis   Malignant neoplasm of head of pancreas ([place])   09/09/2020 - 09/12/2020 Chemotherapy          10/10/2020 -  Chemotherapy    Patient is on Treatment Plan: PANCREATIC ABRAXANE / GEMCITABINE D1,8,15 Q28D         Interval history- Patient is 70 year old female with above history of pancreatic cancer s/p concurrent chemo and radiation who presents to [facility] via telemedicine for complaints of itching x 2 weeks. Symptoms occur all over. Has scratchy red spots on her arms. No new lotions, medications. She completed radiation. Describes as 'splotchy'. Her hair is starting to grow back in. No new medications, supplements, changes in detergents, or topicals. She is taking claritin daily but hasn't noticed improvement in symptoms.  Additionally,  complains of abdominal pain which is chronic in nature. Takes hydrocodone for this. Also complains of nausea which is moderately improved with zofran. CA 19-9 has been dropping since treatment.   Review of systems- Review of Systems  Constitutional:  Negative for chills, fever, malaise/fatigue and weight loss.  HENT:  Negative for hearing loss, nosebleeds, sore throat and tinnitus.   Eyes:  Negative for blurred vision and double vision.  Respiratory:  Negative for cough, hemoptysis, shortness of breath and wheezing.   Cardiovascular:  Negative for chest pain, palpitations and leg swelling.  Gastrointestinal:  Positive for abdominal pain and nausea. Negative for blood in stool, constipation, diarrhea, melena and vomiting.  Genitourinary:  Negative for dysuria and urgency.  Musculoskeletal:  Negative for back pain, falls, joint pain and myalgias.  Skin:  Positive for itching and rash.  Neurological:  Negative for dizziness, tingling, sensory change, loss of consciousness, weakness and headaches.  Endo/Heme/Allergies:  Negative for environmental allergies. Does not bruise/bleed easily.  Psychiatric/Behavioral:  Negative for depression. The patient is not nervous/anxious and does not have insomnia.     Current treatment- surveillance; imaging scheduled for 05/12/21  Allergies  Allergen Reactions   Tramadol Other (See Comments)    unknown   Penicillins Rash    Pt states that she has been taking this medication and has had not problem    Past Medical History:  Diagnosis Date   Abdominal pain, generalized    Anxiety    Atypical chest pain    Back pain    Basal cell carcinoma of skin 2013   resected from Left scalp area.    COPD (chronic obstructive pulmonary disease) (HCC)    Depression    DOE (dyspnea on exertion)    GERD (gastroesophageal reflux disease)    Hypertension    Pancreatic cancer ([place])    Shortness of breath dyspnea     Past Surgical History:  Procedure Laterality  Date   BASAL CELL CARCINOMA EXCISION  2013   CARPAL TUNNEL RELEASE Left 90s   CHOLECYSTECTOMY N/A 09/16/2015   Procedure: LAPAROSCOPIC CHOLECYSTECTOMY WITH INTRAOPERATIVE CHOLANGIOGRAM;  Surgeon: Robert Bellow, MD;  Location: [facility] ORS;  Service: General;  Laterality: N/A;   COLONOSCOPY  1990"s   [facility]   ENDOSCOPIC RETROGRADE CHOLANGIOPANCREATOGRAPHY (ERCP) WITH PROPOFOL N/A 11/10/2020   Procedure: ENDOSCOPIC RETROGRADE CHOLANGIOPANCREATOGRAPHY (ERCP) WITH PROPOFOL;  Surgeon: Lucilla Lame, MD;  Location: [facility] ENDOSCOPY;  Service: Endoscopy;  Laterality: N/A;   ERCP N/A 08/05/2020   Procedure: ENDOSCOPIC RETROGRADE CHOLANGIOPANCREATOGRAPHY (ERCP);  Surgeon: Lucilla Lame, MD;  Location: [facility] ENDOSCOPY;  Service: Endoscopy;  Laterality: N/A;   ESOPHAGOGASTRODUODENOSCOPY (EGD) WITH PROPOFOL N/A 02/05/2017   Procedure: ESOPHAGOGASTRODUODENOSCOPY (EGD) WITH PROPOFOL;  Surgeon: Lollie Sails, MD;  Location: [facility] ENDOSCOPY;  Service: Endoscopy;  Laterality: N/A;   PERIPHERAL VASCULAR CATHETERIZATION Bilateral 12/27/2015   Procedure: Lower Extremity Angiography;  Surgeon: Katha Cabal, MD;  Location: [facility] CV LAB;  Service: Cardiovascular;  Laterality: Bilateral;   PORTA CATH INSERTION N/A 09/08/2020   Procedure: PORTA CATH INSERTION;  Surgeon: Algernon Huxley, MD;  Location: [facility] CV LAB;  Service: Cardiovascular;  Laterality: N/A;   TONSILLECTOMY      Social History   Socioeconomic History   Marital status: Divorced    Spouse name: Not on file   Number of children: Not on file   Years of education: Not on file   Highest education level: Not on file  Occupational History   Occupation: retired  Tobacco Use   Smoking status: Former    Types: Cigarettes    Quit date: 03/02/2012    Years since quitting: 9.0   Smokeless tobacco: Never  Vaping Use   Vaping Use: Never used  Substance and Sexual Activity   Alcohol use: No    Alcohol/week: 0.0 standard drinks    Drug use: No   Sexual activity: Not on file  Other Topics Concern   Not on file  Social History Narrative   [place]; self; smoker- quit 06-21-11; daughter died of leukemia-2020. 2 sons [[place]; [place]]; used to be caregiver/ retd.    Social Determinants of Health   Financial Resource Strain: Not on file  Food Insecurity: Not on file  Transportation Needs: Not on file  Physical Activity: Not on file  Stress: Not on file  Social Connections: Not on file  Intimate Partner Violence: Not on file    Family History  Problem Relation Age of Onset   Cerebral palsy Daughter    Colon polyps Sister    Leukemia Daughter    Depression Son    Breast cancer Neg Hx     Current Outpatient Medications:    acetaminophen (TYLENOL) 325 MG tablet, Take 650 mg by mouth every 6 (six) hours as needed for mild pain or moderate pain., Disp: , Rfl:    ALPRAZolam (XANAX) 0.25 MG tablet, Take 1 tablet (0.25 mg total) by mouth every 12 (twelve) hours as needed for anxiety., Disp: 45 tablet, Rfl: 0   B Complex-C-Folic Acid (B COMPLEX-VITAMIN C-FOLIC ACID) 1 MG tablet, Take 1 tablet by mouth daily with breakfast., Disp: 30 tablet, Rfl: 0   Cetirizine HCl (ZYRTEC ALLERGY PO), Take 1 tablet by mouth daily as needed (allergies)., Disp: , Rfl:    diphenoxylate-atropine (LOMOTIL) 2.5-0.025 MG tablet, Take 1 tablet by mouth 4 (four) times daily as needed for diarrhea or loose stools., Disp: 60 tablet, Rfl: 1   Fluticasone-Salmeterol (ADVAIR) 100-50 MCG/DOSE AEPB, Inhale 1 puff into the lungs 2 (two) times daily., Disp: , Rfl:    HYDROcodone-acetaminophen (NORCO) 5-325 MG tablet, Take 1 tablet by mouth every 12 (twelve) hours as needed for moderate pain., Disp: 45 tablet, Rfl: 0   ipratropium-albuterol (DUONEB) 0.5-2.5 (3) MG/3ML SOLN, Take 3 mLs by nebulization every 4 (four) hours as needed., Disp: 360 mL, Rfl: 0   KLOR-CON M20 20 MEQ tablet, TAKE 1 TABLET BY MOUTH TWICE A DAY, Disp: 60 tablet, Rfl: 0   levalbuterol  (XOPENEX HFA) 45 MCG/ACT inhaler, Inhale 1-2 puffs into the lungs every 6 (six) hours as needed for wheezing or shortness of breath., Disp: 1 each, Rfl: 12   lidocaine-prilocaine (EMLA) cream, Apply 30 -45 mins prior to port access., Disp: 30 g, Rfl: 0   magic mouthwash w/lidocaine SOLN, Take 10 mLs by mouth every 2 (two) hours as needed for mouth pain., Disp: 360 mL, Rfl: 0   megestrol (MEGACE ES) 625 MG/5ML suspension, Take 5 mLs (625 mg total) by mouth daily., Disp: 150 mL, Rfl: 0   ondansetron (ZOFRAN) 8 MG tablet, One pill every 8 hours as needed for nausea/vomitting., Disp: 40 tablet, Rfl: 3   pantoprazole (PROTONIX) 40 MG tablet, TAKE 1 TABLET BY MOUTH EVERY DAY, Disp: 90 tablet, Rfl: 0   prochlorperazine (COMPAZINE) 10 MG tablet, Take 1 tablet (10 mg total) by mouth every 6 (six) hours as needed for nausea or vomiting., Disp: 40 tablet, Rfl: 3  Physical exam: Exam limited due to telemedicine There were no vitals filed  for this visit. Physical Exam Constitutional:      General: She is not in acute distress. HENT:     Head: Normocephalic.  Pulmonary:     Effort: No respiratory distress.  Skin:    Comments: Mild erythema on cheek   Neurological:     Mental Status: She is alert and oriented to person, place, and time.  Psychiatric:        Mood and Affect: Mood normal.        Behavior: Behavior normal.    Assessment and plan- Patient is a 70 y.o. female s/p concurrent chemo and radiation for pancreatic cancer. Most treatment imaging showed good response and tumor marker has been decreasing. She [facility] for complaints of itching.    Itching- etiology unclear. Continue claritin. Will send Atarax TID PRN for symptoms refractory to claritin. Cautioned that medication may make her drowsy. Consider topical calming emollient such as Cerave. If symptoms worsen, notify clinic to be seen in person.   Abdominal Pain- question malignancy related. Continue pain  medication and bowel regimen as prescribed. If symptoms intensify or increase in frequency, notify clinic to manage.   RTC prn.   Visit Diagnosis 1. Itching   2. Malignant neoplasm of head of pancreas [facility])    Patient expressed understanding and was in agreement with this plan. She also understands that She can call clinic at any time with any questions, concerns, or complaints.   I discussed the assessment and treatment plan with the patient. The patient was provided an opportunity to ask questions and all were answered. The patient agreed with the plan and demonstrated an understanding of the instructions.   The patient was advised to call back or seek an in-person evaluation if the symptoms worsen or if the condition fails to improve as anticipated.   I spent 15 minutes face-to-face video visit time dedicated to the care of this patient on the date of this encounter to include pre-visit review of medical oncology notes, imaging, tumor markers, face-to-face time with the patient, and post visit ordering of testing/documentation.   Thank you for allowing me to participate in the care of this very pleasant patient.   Beckey Rutter, DNP, AGNP-C [facility]: Dr. Rogue Bussing

## 2021-03-08 ENCOUNTER — Encounter: Payer: Self-pay | Admitting: Internal Medicine

## 2021-03-09 ENCOUNTER — Telehealth: Payer: Self-pay | Admitting: *Deleted

## 2021-03-09 NOTE — Telephone Encounter (Signed)
Incoming fax from patient's insurance stating that patient's hydroxyine PA will run out on 04/01/21.  I resubmitted a PA for this drug on behalf of the patient.- pending insurance review  Shirley Barton [place] - PA Case ID: [id number]   Your information has been submitted to [place] Medicare Part D. [employer] Part D will review the request and will issue a decision, typically within 1-3 days from your submission. You can check the updated outcome later by reopening this request.  If [employer] has not responded in 1-3 days or if you have any questions about your ePA request, please contact [place] Medicare Part D at [phone]. If you think there may be a problem with your PA request, use our live chat feature at the bottom right.

## 2021-03-09 NOTE — Telephone Encounter (Signed)
This request has been approved. °

## 2021-03-13 ENCOUNTER — Inpatient Hospital Stay: Payer: Medicare HMO

## 2021-03-13 ENCOUNTER — Other Ambulatory Visit: Payer: Self-pay

## 2021-03-13 DIAGNOSIS — C250 Malignant neoplasm of head of pancreas: Secondary | ICD-10-CM | POA: Diagnosis present

## 2021-03-13 DIAGNOSIS — Z95828 Presence of other vascular implants and grafts: Secondary | ICD-10-CM

## 2021-03-13 MED ORDER — SODIUM CHLORIDE 0.9% FLUSH
10.0000 mL | Freq: Once | INTRAVENOUS | Status: AC
  Administered 2021-03-13: 10 mL via INTRAVENOUS
  Filled 2021-03-13: qty 10

## 2021-03-13 MED ORDER — HEPARIN SOD (PORK) LOCK FLUSH 100 UNIT/ML IV SOLN
500.0000 [IU] | Freq: Once | INTRAVENOUS | Status: AC
  Administered 2021-03-13: 500 [IU] via INTRAVENOUS
  Filled 2021-03-13: qty 5

## 2021-03-20 ENCOUNTER — Other Ambulatory Visit: Payer: Self-pay | Admitting: Internal Medicine

## 2021-03-20 MED ORDER — HYDROCODONE-ACETAMINOPHEN 5-325 MG PO TABS: 1.0000 | ORAL_TABLET | Freq: Two times a day (BID) | ORAL | 0 refills | Status: DC | PRN

## 2021-03-21 ENCOUNTER — Encounter: Payer: Self-pay | Admitting: Internal Medicine

## 2021-03-28 ENCOUNTER — Encounter (INDEPENDENT_AMBULATORY_CARE_PROVIDER_SITE_OTHER): Payer: Medicare HMO | Admitting: Vascular Surgery

## 2021-04-10 ENCOUNTER — Other Ambulatory Visit: Payer: Self-pay | Admitting: *Deleted

## 2021-04-10 MED ORDER — HYDROXYZINE HCL 25 MG PO TABS: 25.0000 mg | ORAL_TABLET | Freq: Three times a day (TID) | ORAL | 0 refills | Status: DC | PRN

## 2021-04-18 ENCOUNTER — Encounter (INDEPENDENT_AMBULATORY_CARE_PROVIDER_SITE_OTHER): Payer: Self-pay | Admitting: Vascular Surgery

## 2021-04-18 ENCOUNTER — Other Ambulatory Visit: Payer: Self-pay | Admitting: Hospice and Palliative Medicine

## 2021-04-18 ENCOUNTER — Ambulatory Visit (INDEPENDENT_AMBULATORY_CARE_PROVIDER_SITE_OTHER): Payer: Medicare HMO | Admitting: Vascular Surgery

## 2021-04-18 ENCOUNTER — Other Ambulatory Visit: Payer: Self-pay

## 2021-04-18 VITALS — BP 148/88 | HR 90 | Resp 18 | Ht 63.0 in | Wt 102.0 lb

## 2021-04-18 DIAGNOSIS — I7143 Infrarenal abdominal aortic aneurysm, without rupture: Secondary | ICD-10-CM | POA: Diagnosis not present

## 2021-04-18 DIAGNOSIS — E785 Hyperlipidemia, unspecified: Secondary | ICD-10-CM | POA: Diagnosis not present

## 2021-04-18 DIAGNOSIS — I739 Peripheral vascular disease, unspecified: Secondary | ICD-10-CM

## 2021-04-18 DIAGNOSIS — I10 Essential (primary) hypertension: Secondary | ICD-10-CM

## 2021-04-18 MED ORDER — HYDROCODONE-ACETAMINOPHEN 5-325 MG PO TABS: 1.0000 | ORAL_TABLET | Freq: Two times a day (BID) | ORAL | 0 refills | Status: DC | PRN

## 2021-04-18 NOTE — Assessment & Plan Note (Signed)
lipid control important in reducing the progression of atherosclerotic disease. Continue statin therapy  

## 2021-04-18 NOTE — Assessment & Plan Note (Signed)
blood pressure control important in reducing the progression of atherosclerotic disease. On appropriate oral medications.  

## 2021-04-18 NOTE — Patient Instructions (Signed)
Abdominal Aortic Aneurysm  An aneurysm is a bulge in a blood vessel that carries blood away from the heart (artery). It happens when blood pushes against a weak or damaged place on the wall of the blood vessel. An abdominal aortic aneurysm happens in the main blood vessel that carries blood away from the heart (aorta). Most aneurysms do not cause problems, but some do cause problems. If an aneurysm grows, it can burst or tear. This causes bleeding inside you. It is anemergency. It can be life-threatening. What are the causes? The exact cause of this condition is not known. What increases the risk? The following factors may make you more likely to develop this condition: Being female and 60 years of age or older. Being of [place] European descent. Using nicotine or tobacco now or in the past. Having a family history of aneurysms. Having any of these problems: Hardening of blood vessels that carry blood away from the heart. Irritation and swelling of the walls of blood vessels that carry blood away from the heart. Certain genetic problems. Being very overweight. An infection in the wall of your aorta. High cholesterol. High blood pressure (hypertension). What are the signs or symptoms? Symptoms depend on the size of your aneurysm and how fast it is growing. Most aneurysms grow slowly and do not cause symptoms. If symptoms happen, you may: Have very bad pain in your belly (abdomen), side, or low back. Feel full after eating only a little food. Feel a throbbing lump in your belly. Have these problems with your feet or toes: Pain. Skin turning blue. Sores. Have trouble pooping (constipation). Have trouble peeing (urinating). If your aneurysm bursts, you may: Feel sudden, very bad pain in the belly, side, or back. Feel like you may vomit. Vomit. Feel light-headed. Faint. How is this treated? Treatment for this condition depends on: The size of your aneurysm. How fast it is  growing. Your age. Your risk of having the aneurysm burst. If your aneurysm is smaller than 2 inches (5 cm), your doctor may: Check it often to see if it is growing. You may have an imaging test (ultrasound) to check it every 3-6 months, every year, or every few years. Give you medicines for: High blood pressure. Pain. Infection. If your aneurysm is larger than 2 inches (5 cm), you may need surgery to fix it. Follow these instructions at home: Eating and drinking  Eat a heart-healthy diet. Eat a lot of: Fresh fruits and vegetables. Whole grains. Low-fat (lean) protein. Low-fat dairy products. Avoid foods that are high in saturated fat and cholesterol. These foods include red meat and some dairy products.  Lifestyle     Do not use any products that contain nicotine or tobacco, such as cigarettes, e-cigarettes, and chewing tobacco. If you need help quitting, ask your doctor. Stay active and get exercise. Ask your doctor how often to exercise and what types of exercise are safe for you. Keep a healthy weight. Alcohol use Do not drink alcohol if: Your doctor tells you not to drink. You are pregnant, may be pregnant, or are planning to become pregnant. If you drink alcohol: Limit how much you use to: 0-1 drink a day for women. 0-2 drinks a day for men. Be aware of how much alcohol is in your drink. In the [place] one drink equals one 12 oz bottle of beer (355 mL), one 5 oz glass of wine (148 mL), or one 1 oz glass of hard liquor (44 mL). General instructions   Take over-the-counter and prescription medicines only as told by your doctor. Keep your blood pressure in a normal range. Check it at regular times. Ask your doctor what level it should be. Have regular checks of your levels of blood sugar (glucose) and cholesterol. Follow steps to keep these levels near normal. Avoid heavy lifting and activities that take a lot of effort. Ask what activities are safe for you. If you can, learn  your family's health history. Keep all follow-up visits as told by your doctor. This is important. Contact a doctor if: Your belly, side, or back hurts. Your belly throbs. You have a fever. Get help right away if: You have sudden, bad pain in your belly, side, or back. You feel like you may vomit or you vomit. You feel light-headed or you faint. Your heart beats fast when you stand. You have sweaty skin that is cold to the touch (clammy). You are short of breath. You have trouble pooping. You have trouble peeing. These symptoms may be an emergency. Do not wait to see if the symptoms will go away. Get medical help right away. Call your local emergency services (911 in the [place] Do not drive yourself to the hospital. Summary An aneurysm is a bulge in one of the blood vessels that carry blood away from the heart (artery). An abdominal aortic aneurysm happens in the main blood vessel that carries blood away from the heart (aorta). This condition can cause bleeding inside the body. It can be life-threatening. Risk can rise if you are female, age 60 or older, and of [place] European descent. Risk can also rise from nicotine or tobacco use or having aneurysms in the family. Get help right away if you have symptoms of a burst aneurysm. This information is not intended to replace advice given to you by your health care provider. Make sure you discuss any questions you have with your healthcare provider. Document Revised: 01/02/2019 Document Reviewed: 01/02/2019 Elsevier Patient Education  2022 Elsevier Inc.  

## 2021-04-18 NOTE — Assessment & Plan Note (Signed)
I have independently reviewed her CT scan.  This was not done as a CT angiogram, so it is a little difficult to discern flow through the iliac stents in the common iliac artery and distal aorta.  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  She is not currently having any ischemic symptoms to the lower extremity.  At 3.7 cm, the aneurysm does not pose immediate risk.  If it does grow or if her iliac stents become more problematic, we may have to consider an Endologix stent graft repair which would address both the aneurysm and occlusive disease.  At this point, we will plan on seeing her back in 3 to 6 months with duplex and ABIs.

## 2021-04-18 NOTE — Assessment & Plan Note (Signed)
The patient has previously had kissing stents placed in the common iliac arteries.  The proximal extent of the stents are clearly in the aneurysm on the CT scan.  She is not currently having any lifestyle limiting claudication, ischemic rest pain, or ulceration.  It is difficult to discern on the CT scan, but they appear to have flow through the stents.  I will plan on checking ABIs at her next visit we will recheck her aorta.  I have discussed that if she develops significant claudication symptoms or rest pain in the interim, to give [place] a call.

## 2021-04-18 NOTE — Progress Notes (Signed)
Patient ID: Shirley Barton, female   DOB: 03/31/1951, 71 y.o.   MRN: [id number]  Chief Complaint  Patient presents with   New Patient (Initial Visit)    Consult    HPI Shirley Barton is a 71 y.o. female.  I am asked to see the patient by Dr. Albertina Parr for evaluation of an abdominal aortic aneurysm.  The patient has had known aneurysmal disease for some time.  She does have some abdominal pain and early satiety with nausea but is undergoing treatment for pancreas cancer at current.  This really has hit her pretty hard.  Chemotherapy is difficult.  She has alopecia.  She denies any chest pain or signs of peripheral embolization.  She has previously had iliac stents placed for short distance claudication.  Although her activity levels are quite down, she is not currently having lifestyle limiting claudication, ischemic rest pain, or ulceration. I have independently reviewed her CT scan.  This was not done as a CT angiogram, so it is a little difficult to discern flow through the iliac stents in the common iliac artery and distal aorta.  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     Past Medical History:  Diagnosis Date   Abdominal pain, generalized    Anxiety    Atypical chest pain    Back pain    Basal cell carcinoma of skin 2013   resected from Left scalp area.    COPD (chronic obstructive pulmonary disease) (HCC)    Depression    DOE (dyspnea on exertion)    GERD (gastroesophageal reflux disease)    Hypertension    Pancreatic cancer ([place])    Shortness of breath dyspnea     Past Surgical History:  Procedure Laterality Date   BASAL CELL CARCINOMA EXCISION  2013   CARPAL TUNNEL RELEASE Left 90s   CHOLECYSTECTOMY N/A 09/16/2015   Procedure: LAPAROSCOPIC CHOLECYSTECTOMY WITH INTRAOPERATIVE CHOLANGIOGRAM;  Surgeon: Robert Bellow, MD;  Location: [facility] ORS;  Service: General;  Laterality: N/A;   COLONOSCOPY  1990"s   [place]   ENDOSCOPIC  RETROGRADE CHOLANGIOPANCREATOGRAPHY (ERCP) WITH PROPOFOL N/A 11/10/2020   Procedure: ENDOSCOPIC RETROGRADE CHOLANGIOPANCREATOGRAPHY (ERCP) WITH PROPOFOL;  Surgeon: Lucilla Lame, MD;  Location: [facility] ENDOSCOPY;  Service: Endoscopy;  Laterality: N/A;   ERCP N/A 08/05/2020   Procedure: ENDOSCOPIC RETROGRADE CHOLANGIOPANCREATOGRAPHY (ERCP);  Surgeon: Lucilla Lame, MD;  Location: [facility];  Service: Endoscopy;  Laterality: N/A;   ESOPHAGOGASTRODUODENOSCOPY (EGD) WITH PROPOFOL N/A 02/05/2017   Procedure: ESOPHAGOGASTRODUODENOSCOPY (EGD) WITH PROPOFOL;  Surgeon: Lollie Sails, MD;  Location: [facility] ENDOSCOPY;  Service: Endoscopy;  Laterality: N/A;   PERIPHERAL VASCULAR CATHETERIZATION Bilateral 12/27/2015   Procedure: Lower Extremity Angiography;  Surgeon: Katha Cabal, MD;  Location: [place] CV LAB;  Service: Cardiovascular;  Laterality: Bilateral;   PORTA CATH INSERTION N/A 09/08/2020   Procedure: PORTA CATH INSERTION;  Surgeon: Algernon Huxley, MD;  Location: [place] [facility] LAB;  Service: Cardiovascular;  Laterality: N/A;   TONSILLECTOMY       Family History  Problem Relation Age of Onset   Cerebral palsy Daughter    Colon polyps Sister    Leukemia Daughter    Depression Son    Breast cancer Neg Hx       Social History   Tobacco Use   Smoking status: Former    Types: Cigarettes    Quit date: 03/02/2012    Years since quitting: 9.1  Smokeless tobacco: Never  Vaping Use   Vaping Use: Never used  Substance Use Topics   Alcohol use: No    Alcohol/week: 0.0 standard drinks   Drug use: No     Allergies  Allergen Reactions   Tramadol Other (See Comments)    unknown   Penicillins Rash    Pt states that she has been taking this medication and has had not problem    Current Outpatient Medications  Medication Sig Dispense Refill   ALPRAZolam (XANAX) 0.25 MG tablet Take 1 tablet (0.25 mg total) by mouth every 12 (twelve) hours as needed for anxiety. 45 tablet 0    B Complex-C-Folic Acid (B COMPLEX-VITAMIN C-FOLIC ACID) 1 MG tablet Take 1 tablet by mouth daily with breakfast. 30 tablet 0   diphenoxylate-atropine (LOMOTIL) 2.5-0.025 MG tablet Take 1 tablet by mouth 4 (four) times daily as needed for diarrhea or loose stools. 60 tablet 1   Fluticasone-Salmeterol (ADVAIR) 100-50 MCG/DOSE AEPB Inhale 1 puff into the lungs 2 (two) times daily.     hydrOXYzine (ATARAX) 25 MG tablet Take 1 tablet (25 mg total) by mouth 3 (three) times daily as needed for itching. 90 tablet 0   KLOR-CON M20 20 MEQ tablet TAKE 1 TABLET BY MOUTH TWICE A DAY 60 tablet 0   levalbuterol (XOPENEX HFA) 45 MCG/ACT inhaler Inhale 1-2 puffs into the lungs every 6 (six) hours as needed for wheezing or shortness of breath. 1 each 12   lidocaine-prilocaine (EMLA) cream Apply 30 -45 mins prior to port access. 30 g 0   ondansetron (ZOFRAN) 8 MG tablet One pill every 8 hours as needed for nausea/vomitting. 40 tablet 3   pantoprazole (PROTONIX) 40 MG tablet TAKE 1 TABLET BY MOUTH EVERY DAY 90 tablet 0   prochlorperazine (COMPAZINE) 10 MG tablet Take 1 tablet (10 mg total) by mouth every 6 (six) hours as needed for nausea or vomiting. 40 tablet 3   acetaminophen (TYLENOL) 325 MG tablet Take 650 mg by mouth every 6 (six) hours as needed for mild pain or moderate pain. (Patient not taking: Reported on 04/18/2021)     Cetirizine HCl (ZYRTEC ALLERGY PO) Take 1 tablet by mouth daily as needed (allergies). (Patient not taking: Reported on 04/18/2021)     HYDROcodone-acetaminophen (NORCO) 5-325 MG tablet Take 1 tablet by mouth every 12 (twelve) hours as needed for moderate pain. 45 tablet 0   ipratropium-albuterol (DUONEB) 0.5-2.5 (3) MG/3ML SOLN Take 3 mLs by nebulization every 4 (four) hours as needed. 360 mL 0   magic mouthwash w/lidocaine SOLN Take 10 mLs by mouth every 2 (two) hours as needed for mouth pain. (Patient not taking: Reported on 04/18/2021) 360 mL 0   megestrol (MEGACE ES) 625 MG/5ML suspension  Take 5 mLs (625 mg total) by mouth daily. (Patient not taking: Reported on 04/18/2021) 150 mL 0   No current facility-administered medications for this visit.      REVIEW OF SYSTEMS (Negative unless checked)  Constitutional: [x] Weight loss  [] Fever  [] Chills Cardiac: [] Chest pain   [] Chest pressure   [] Palpitations   [] Shortness of breath when laying flat   [] Shortness of breath at rest   [] Shortness of breath with exertion. Vascular:  [] Pain in legs with walking   [] Pain in legs at rest   [] Pain in legs when laying flat   [] Claudication   [] Pain in feet when walking  [] Pain in feet at rest  [] Pain in feet when laying flat   [] History of DVT   []   Phlebitis   [] Swelling in legs   [] Varicose veins   [] Non-healing ulcers Pulmonary:   [] Uses home oxygen   [] Productive cough   [] Hemoptysis   [] Wheeze  [] COPD   [] Asthma Neurologic:  [] Dizziness  [] Blackouts   [] Seizures   [] History of stroke   [] History of TIA  [] Aphasia   [] Temporary blindness   [] Dysphagia   [] Weakness or numbness in arms   [] Weakness or numbness in legs Musculoskeletal:  [x] Arthritis   [] Joint swelling   [x] Joint pain   [x] Low back pain Hematologic:  [] Easy bruising  [] Easy bleeding   [] Hypercoagulable state   [] Anemic  [] Hepatitis Gastrointestinal:  [] Blood in stool   [] Vomiting blood  [] Gastroesophageal reflux/heartburn   [] Abdominal pain Genitourinary:  [] Chronic kidney disease   [] Difficult urination  [] Frequent urination  [] Burning with urination   [] Hematuria Skin:  [] Rashes   [] Ulcers   [] Wounds Psychological:  [] History of anxiety   []  History of major depression.    Physical Exam BP (!) 148/88 (BP Location: Left Arm)    Pulse 90    Resp 18    Ht 5\' 3"  (1.6 m)    Wt 102 lb (46.3 kg)    LMP 01/03/1995 (Approximate)    BMI 18.07 kg/m  Gen:  Thin and frail, NAD Head: [place]/AT, + temporalis wasting.  Ear/Nose/Throat: Hearing grossly intact, nares w/o erythema or drainage, oropharynx w/o Erythema/Exudate Eyes: Conjunctiva  clear, sclera non-icteric  Neck: trachea midline.  No JVD.  Pulmonary:  Good air movement, respirations not labored, no use of accessory muscles  Cardiac: RRR, no JVD Vascular:  Vessel Right Left  Radial Palpable Palpable                          DP 1+ 1+  PT 1+ 1+   Gastrointestinal:. No masses, surgical incisions, or scars. Increased aortic impulse Musculoskeletal: M/S 5/5 throughout.  Extremities without ischemic changes.  No deformity or atrophy. Trace LE edema. Neurologic: Sensation grossly intact in extremities.  Symmetrical.  Speech is fluent. Motor exam as listed above. Psychiatric: Judgment intact, Mood & affect appropriate for pt's clinical situation. Dermatologic: No rashes or ulcers noted.  No cellulitis or open wounds.    Radiology No results found.  Labs Recent Results (from the past 2160 hour(s))  Cancer antigen 19-9     Status: None   Collection Time: 02/13/21  9:55 AM  Result Value Ref Range   CA 19-9 15 0 - 35 U/mL    Comment: (NOTE) Roche Diagnostics Electrochemiluminescence Immunoassay (ECLIA) Values obtained with different assay methods or kits cannot be used interchangeably.  Results cannot be interpreted as absolute evidence of the presence or absence of malignant disease. Performed At: [facility] [place], [place] [id number] Rush Farmer MD [id number]   Comprehensive metabolic panel     Status: Abnormal   Collection Time: 02/13/21  9:55 AM  Result Value Ref Range   Sodium 136 135 - 145 mmol/L   Potassium 4.2 3.5 - 5.1 mmol/L   Chloride 100 98 - 111 mmol/L   CO2 27 22 - 32 mmol/L   Glucose, Bld 65 (L) 70 - 99 mg/dL    Comment: Glucose reference range applies only to samples taken after fasting for at least 8 hours.   BUN 14 8 - 23 mg/dL   Creatinine, Ser 0.63 0.44 - 1.00 mg/dL   Calcium 9.6 8.9 - 10.3 mg/dL   Total Protein 8.8 (H) 6.5 -  8.1 g/dL   Albumin 4.7 3.5 - 5.0 g/dL   AST 25 15 - 41 U/L   ALT 14 0 -  44 U/L   Alkaline Phosphatase 67 38 - 126 U/L   Total Bilirubin 0.7 0.3 - 1.2 mg/dL   GFR, Estimated >60 >60 mL/min    Comment: (NOTE) Calculated using the CKD-EPI Creatinine Equation (2021)    Anion gap 9 5 - 15    Comment: Performed at [facility], [address] [place], [place] [address]  CBC with Differential     Status: Abnormal   Collection Time: 02/13/21  9:55 AM  Result Value Ref Range   WBC 9.5 4.0 - 10.5 K/uL   RBC 5.20 (H) 3.87 - 5.11 MIL/uL   Hemoglobin 15.8 (H) 12.0 - 15.0 g/dL   HCT 48.1 (H) 36.0 - 46.0 %   MCV 92.5 80.0 - 100.0 fL   MCH 30.4 26.0 - 34.0 pg   MCHC 32.8 30.0 - 36.0 g/dL   RDW 13.6 11.5 - 15.5 %   Platelets 215 150 - 400 K/uL   nRBC 0.0 0.0 - 0.2 %   Neutrophils Relative % 75 %   Neutro Abs 7.1 1.7 - 7.7 K/uL   Lymphocytes Relative 15 %   Lymphs Abs 1.5 0.7 - 4.0 K/uL   Monocytes Relative 8 %   Monocytes Absolute 0.8 0.1 - 1.0 K/uL   Eosinophils Relative 1 %   Eosinophils Absolute 0.1 0.0 - 0.5 K/uL   Basophils Relative 0 %   Basophils Absolute 0.0 0.0 - 0.1 K/uL   Immature Granulocytes 1 %   Abs Immature Granulocytes 0.05 0.00 - 0.07 K/uL    Comment: Performed at [facility], [address] [place], [place] [address]    Assessment/Plan:  Essential (primary) hypertension blood pressure control important in reducing the progression of atherosclerotic disease. On appropriate oral medications.   HLD (hyperlipidemia) lipid control important in reducing the progression of atherosclerotic disease. Continue statin therapy   PAD (peripheral artery disease) ([place]) The patient has previously had kissing stents placed in the common iliac arteries.  The proximal extent of the stents are clearly in the aneurysm on the CT scan.  She is not currently having any lifestyle limiting claudication, ischemic rest pain, or ulceration.  It is difficult to discern on the CT scan, but they appear to have flow through the stents.  I will plan on  checking ABIs at her next visit we will recheck her aorta.  I have discussed that if she develops significant claudication symptoms or rest pain in the interim, to give [place] a call.  AAA (abdominal aortic aneurysm) ([place]) I have independently reviewed her CT scan.  This was not done as a CT angiogram, so it is a little difficult to discern flow through the iliac stents in the common iliac artery and distal aorta.  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  She is not currently having any ischemic symptoms to the lower extremity.  At 3.7 cm, the aneurysm does not pose immediate risk.  If it does grow or if her iliac stents become more problematic, we may have to consider an Endologix stent graft repair which would address both the aneurysm and occlusive disease.  At this point, we will plan on seeing her back in 3 to 6 months with duplex and ABIs.      [name] 04/18/2021, 3:07 PM   This  note was created with Dragon medical transcription system.  Any errors from dictation are unintentional.

## 2021-05-11 ENCOUNTER — Other Ambulatory Visit: Payer: Self-pay | Admitting: Internal Medicine

## 2021-05-12 ENCOUNTER — Ambulatory Visit
Admission: RE | Admit: 2021-05-12 | Discharge: 2021-05-12 | Disposition: A | Discharge status: Home / Self Care | Payer: Medicare HMO | Source: Ambulatory Visit | Attending: Internal Medicine | Admitting: Internal Medicine

## 2021-05-12 ENCOUNTER — Other Ambulatory Visit: Payer: Self-pay

## 2021-05-12 DIAGNOSIS — C250 Malignant neoplasm of head of pancreas: Secondary | ICD-10-CM | POA: Insufficient documentation

## 2021-05-12 LAB — POCT I-STAT CREATININE: Creatinine, Ser: 0.6 mg/dL (ref 0.44–1.00)

## 2021-05-12 IMAGING — CT CT ABD-PELV W/ CM
2 of 5 series · 14 of 46 positions shown, 16 images · IV contrast (agent unspecified)
Comparison: Abdominopelvic CT [DATE] and [DATE]. Abdominal
MRI [DATE].

CLINICAL DATA: Pancreatic cancer diagnosed in [DATE]. Previous
biliary stent placement, chemotherapy and radiation therapy.
Restaging. Assess treatment response.

EXAM:
CT ABDOMEN AND PELVIS WITH CONTRAST
TECHNIQUE: Multidetector CT imaging of the abdomen and pelvis was performed
using the standard protocol following bolus administration of
intravenous contrast.

[Series 2: abd pelvis 5.00 · axial · 0.59mm/px · z∈[-1518,-1153]mm · 11 of 83 slices shown, 13 images]
[im 5/83  soft-tissue]
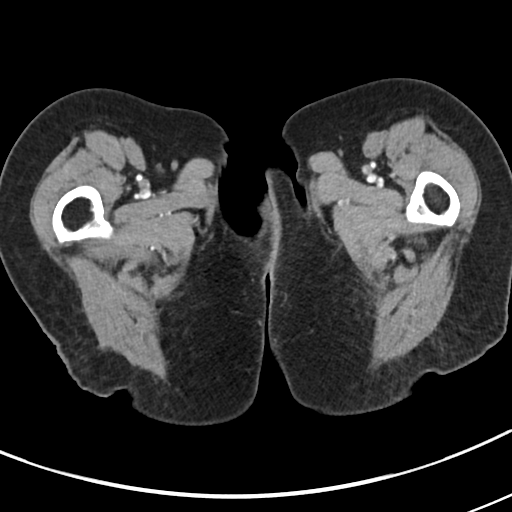
[im 5/83  bone]
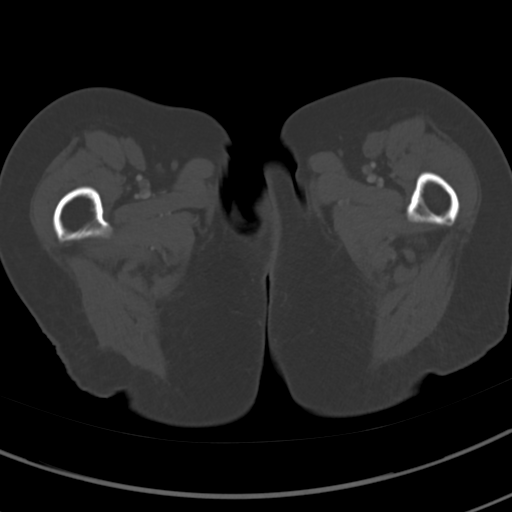
[im 15/83  soft-tissue]
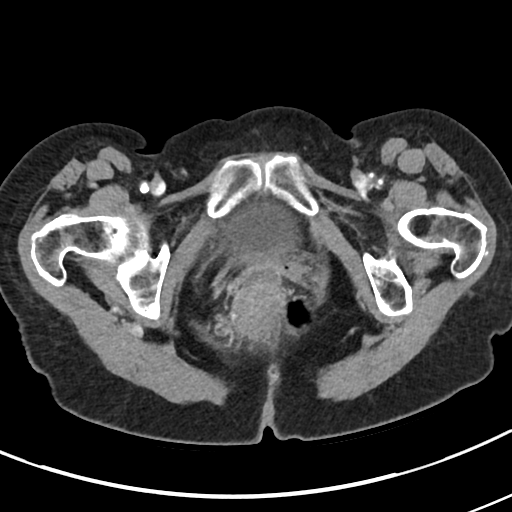
[im 20/83  soft-tissue]
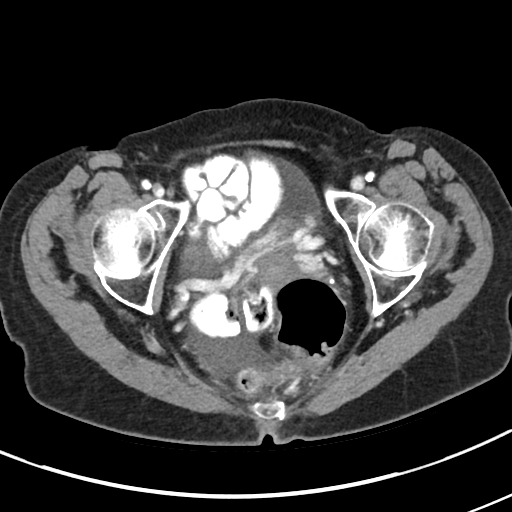
[im 29/83  soft-tissue]
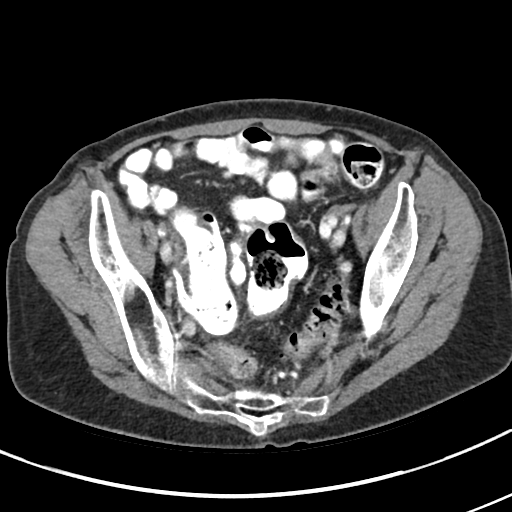
[im 34/83  soft-tissue]
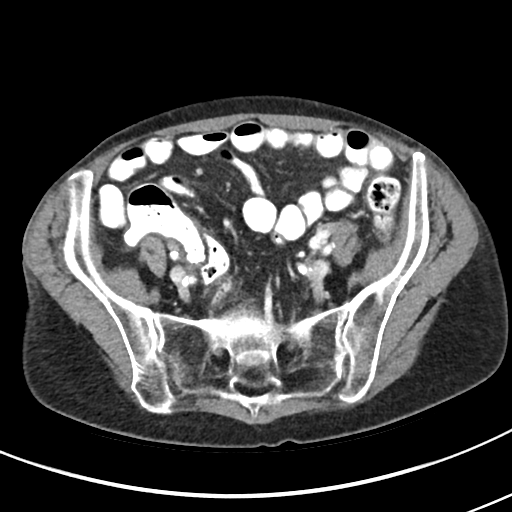
[im 44/83  soft-tissue]
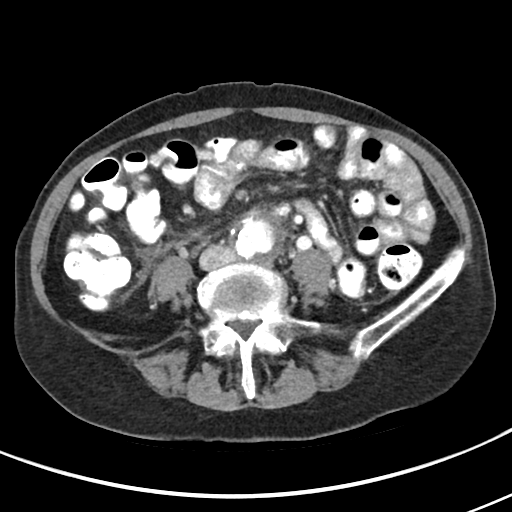
[im 49/83  soft-tissue]
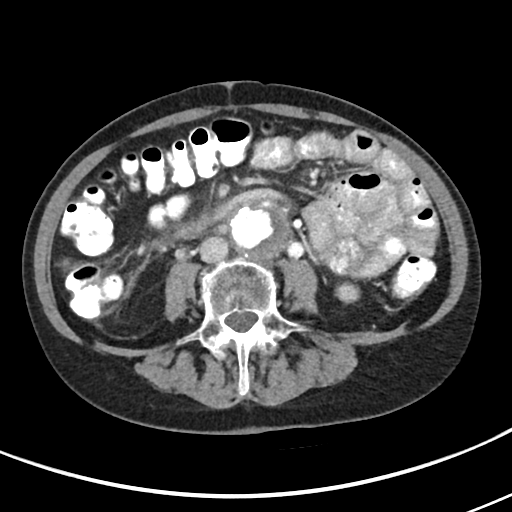
[im 54/83  soft-tissue]
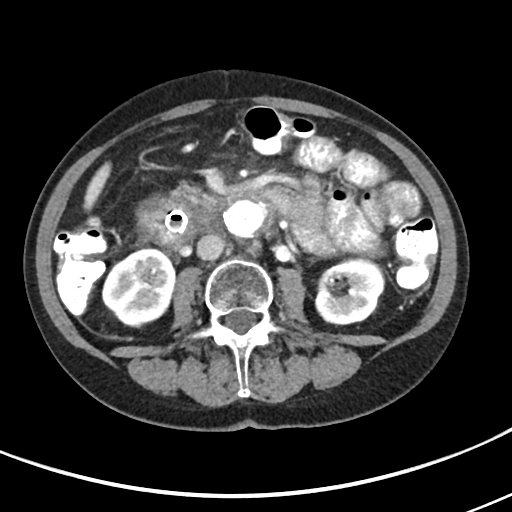
[im 63/83  soft-tissue]
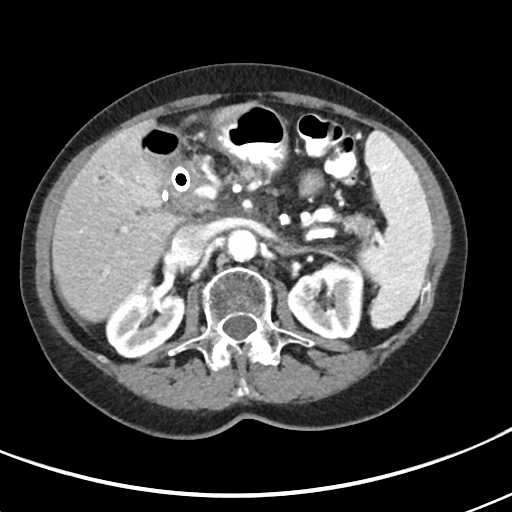
[im 63/83  bone]
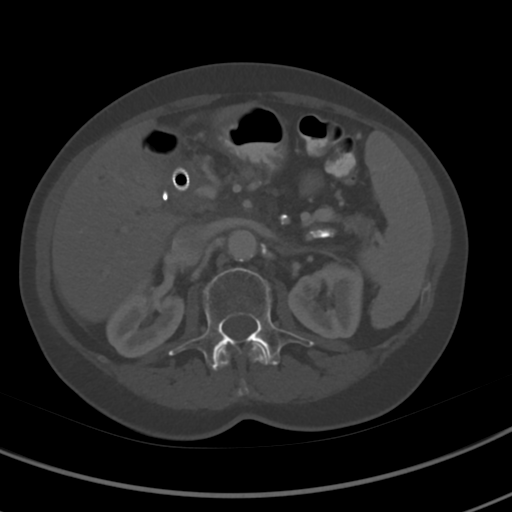
[im 68/83  soft-tissue]
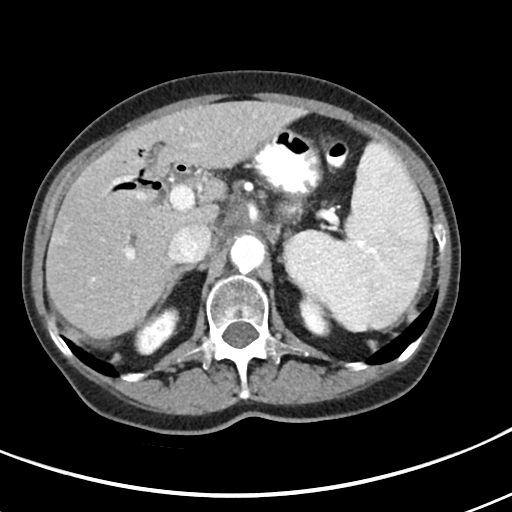
[im 78/83  soft-tissue]
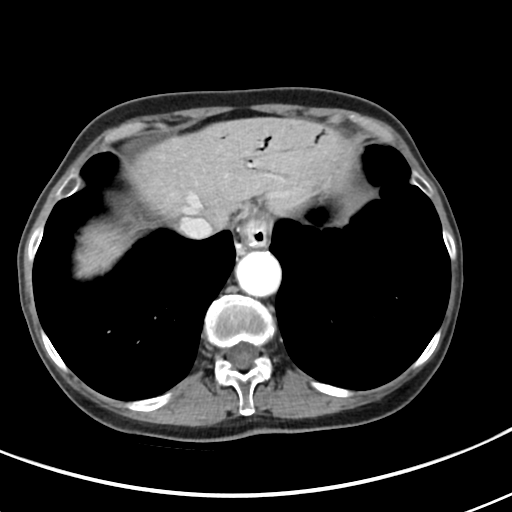

[Series 4: coronals abd pelvis 2.00 cor · coronal · 0.59mm/px · 3 of 119 slices shown]
[im 40/119  soft-tissue]
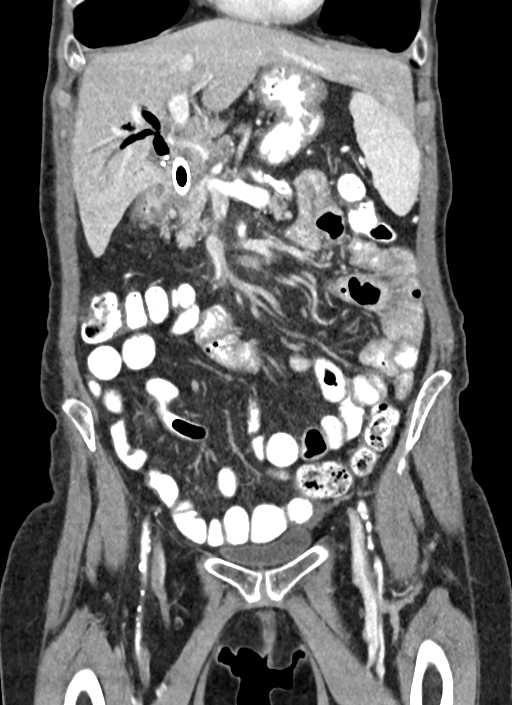
[im 53/119  soft-tissue]
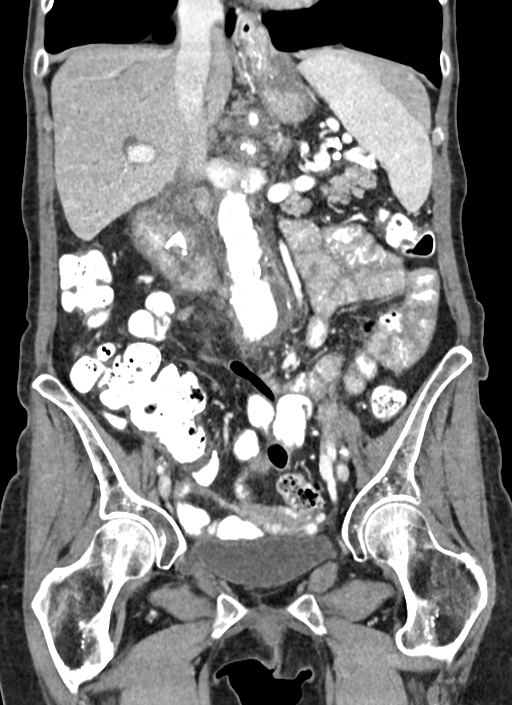
[im 66/119  soft-tissue]
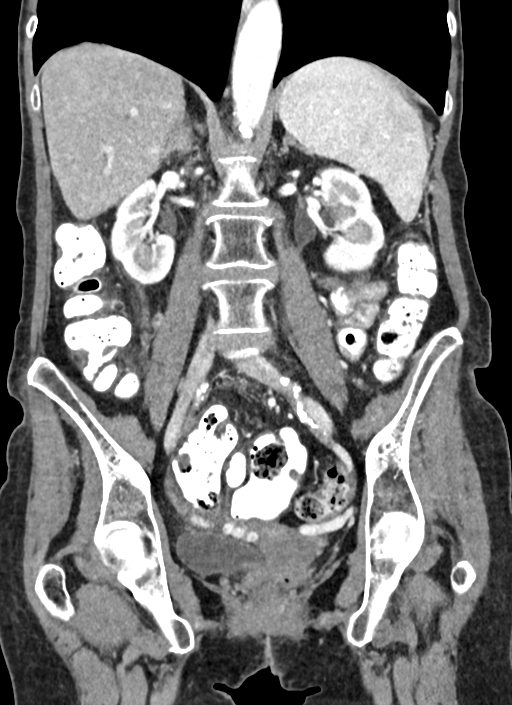

[14 of 46 positions shown; findings below may reference images not displayed]

RADIATION DOSE REDUCTION: This exam was performed according to the
departmental dose-optimization program which includes automated
exposure control, adjustment of the mA and/or kV according to
patient size and/or use of iterative reconstruction technique.

CONTRAST:  75mL OMNIPAQUE IOHEXOL 300 MG/ML  SOLN
FINDINGS: Lower chest: Clear lung bases. No significant pleural or pericardial
effusion. Emphysematous changes are again noted at both lung bases.
There is a small hiatal hernia with associated mild wall thickening.

Hepatobiliary: The liver is normal in density without suspicious
focal abnormality. Small transient hepatic attenuation differences
(incidental vascular shunts) are again noted peripherally in the
right lobe. Metallic biliary stent is unchanged in position. There
is stable pneumobilia and mild biliary dilatation.

Pancreas: There is ill-defined low-density within the pancreatic
head surrounding the biliary stent with soft tissue stranding in the
surrounding retroperitoneal fat. These findings are attributed to
previous radiation therapy. No well-defined residual mass lesion is
identified. There is no pancreatic ductal dilatation or abnormality
within the pancreatic body or tail.

Spleen: Normal in size without focal abnormality.

Adrenals/Urinary Tract: Both adrenal glands appear normal. The
kidneys appear normal without evidence of urinary tract calculus,
suspicious lesion or hydronephrosis. No bladder abnormalities are
seen.

Stomach/Bowel: Enteric contrast was administered and has passed into
the distal colon. There is some wall thickening of the distal
stomach and duodenum, attributed to prior radiation therapy. The
remainder of the small bowel, appendix and colon appear normal.
There is mildly prominent stool in the distal colon.

Vascular/Lymphatic: There are no enlarged abdominal or pelvic lymph
nodes. Stable small lymph nodes in the gastrohepatic ligament. Again
demonstrated is diffuse aortic and branch vessel atherosclerosis
with in a regular infrarenal abdominal aortic aneurysm measuring
x 3.6 cm on image 37/2 (previously 3.7 x 3.2 cm). Bilateral common
iliac stents are in place. There is persistent focal narrowing of
the portal vein posterior to the pancreatic head with adjacent
collateral vessels. No evidence of acute venous thrombosis.

Reproductive: Uterine atrophy.  No evidence of adnexal mass.

Other: New small volume pelvic ascites without peritoneal nodularity
to suggest carcinomatosis at this time.

Musculoskeletal: No acute or significant osseous findings. Mild
spondylosis.
IMPRESSION: 1. Interval increased low-density within the pancreatic head
surrounding the biliary stent with soft tissue stranding in the
adjacent retroperitoneal fat and mild duodenal wall thickening,
attributed to radiation therapy. No measurable residual pancreatic
head mass or definite metastases identified.
2. New small volume of ascites without associated nodularity to
suggest peritoneal carcinomatosis at this time.
3. The biliary stent is unchanged in position and remains patent
with stable pneumobilia and mild biliary dilatation.
4. Unchanged focal narrowing of the portal vein posterior to the
pancreatic head which may relate to the pancreatic malignancy or
subsequent treatment. No evidence of acute vascular thrombosis.
5. 3.9 cm abdominal aortic aneurysm, minimally larger than previous
studies. Extensive associated mural thrombus. Recommend follow-up
ultrasound every 2 years if not otherwise imaged. This
recommendation follows ACR consensus guidelines: White Paper of the
ACR Incidental Findings Committee II on Vascular Findings. [HOSPITAL] [9F]; [DATE].
6. Aortic Atherosclerosis ([9F]-[9F]) and Emphysema ([9F]-[9F]).

## 2021-05-12 MED ORDER — IOHEXOL 300 MG/ML  SOLN
75.0000 mL | Freq: Once | INTRAMUSCULAR | Status: AC | PRN
  Administered 2021-05-12: 75 mL via INTRAVENOUS

## 2021-05-15 ENCOUNTER — Other Ambulatory Visit: Payer: Self-pay

## 2021-05-15 ENCOUNTER — Inpatient Hospital Stay (HOSPITAL_BASED_OUTPATIENT_CLINIC_OR_DEPARTMENT_OTHER): Payer: Medicare HMO | Admitting: Internal Medicine

## 2021-05-15 ENCOUNTER — Inpatient Hospital Stay: Payer: Medicare HMO | Attending: Internal Medicine

## 2021-05-15 ENCOUNTER — Encounter: Payer: Self-pay | Admitting: Internal Medicine

## 2021-05-15 VITALS — BP 149/92 | HR 97 | Temp 98.6°F | Ht 63.0 in | Wt 100.6 lb

## 2021-05-15 DIAGNOSIS — R0989 Other specified symptoms and signs involving the circulatory and respiratory systems: Secondary | ICD-10-CM | POA: Insufficient documentation

## 2021-05-15 DIAGNOSIS — C250 Malignant neoplasm of head of pancreas: Secondary | ICD-10-CM | POA: Diagnosis present

## 2021-05-15 DIAGNOSIS — F419 Anxiety disorder, unspecified: Secondary | ICD-10-CM | POA: Diagnosis not present

## 2021-05-15 DIAGNOSIS — R0981 Nasal congestion: Secondary | ICD-10-CM | POA: Diagnosis not present

## 2021-05-15 DIAGNOSIS — Z87891 Personal history of nicotine dependence: Secondary | ICD-10-CM | POA: Diagnosis not present

## 2021-05-15 DIAGNOSIS — Z806 Family history of leukemia: Secondary | ICD-10-CM | POA: Diagnosis not present

## 2021-05-15 DIAGNOSIS — I10 Essential (primary) hypertension: Secondary | ICD-10-CM | POA: Insufficient documentation

## 2021-05-15 DIAGNOSIS — R188 Other ascites: Secondary | ICD-10-CM | POA: Diagnosis not present

## 2021-05-15 DIAGNOSIS — I700 Atherosclerosis of aorta: Secondary | ICD-10-CM | POA: Diagnosis not present

## 2021-05-15 DIAGNOSIS — R590 Localized enlarged lymph nodes: Secondary | ICD-10-CM | POA: Insufficient documentation

## 2021-05-15 LAB — COMPREHENSIVE METABOLIC PANEL
ALT: 13 U/L (ref 0–44)
AST: 20 U/L (ref 15–41)
Albumin: 3.7 g/dL (ref 3.5–5.0)
Alkaline Phosphatase: 80 U/L (ref 38–126)
Anion gap: 7 (ref 5–15)
BUN: 7 mg/dL — ABNORMAL LOW (ref 8–23)
CO2: 27 mmol/L (ref 22–32)
Calcium: 9.1 mg/dL (ref 8.9–10.3)
Chloride: 101 mmol/L (ref 98–111)
Creatinine, Ser: 0.55 mg/dL (ref 0.44–1.00)
GFR, Estimated: >60 mL/min (ref >60)
Glucose, Bld: 103 mg/dL — ABNORMAL HIGH (ref 70–99)
Potassium: 3.7 mmol/L (ref 3.5–5.1)
Sodium: 135 mmol/L (ref 135–145)
Total Bilirubin: 0.6 mg/dL (ref 0.3–1.2)
Total Protein: 7.3 g/dL (ref 6.5–8.1)

## 2021-05-15 LAB — CBC WITH DIFFERENTIAL/PLATELET
Abs Immature Granulocytes: 0.04 10*3/uL (ref 0.00–0.07)
Basophils Absolute: 0 10*3/uL (ref 0.0–0.1)
Basophils Relative: 1 %
Eosinophils Absolute: 0.1 10*3/uL (ref 0.0–0.5)
Eosinophils Relative: 2 %
HCT: 38.7 % (ref 36.0–46.0)
Hemoglobin: 12.4 g/dL (ref 12.0–15.0)
Immature Granulocytes: 1 %
Lymphocytes Relative: 15 %
Lymphs Abs: 1 10*3/uL (ref 0.7–4.0)
MCH: 28.6 pg (ref 26.0–34.0)
MCHC: 32 g/dL (ref 30.0–36.0)
MCV: 89.4 fL (ref 80.0–100.0)
Monocytes Absolute: 0.5 10*3/uL (ref 0.1–1.0)
Monocytes Relative: 8 %
Neutro Abs: 4.8 10*3/uL (ref 1.7–7.7)
Neutrophils Relative %: 73 %
Platelets: 146 10*3/uL — ABNORMAL LOW (ref 150–400)
RBC: 4.33 MIL/uL (ref 3.87–5.11)
RDW: 13.2 % (ref 11.5–15.5)
WBC: 6.5 10*3/uL (ref 4.0–10.5)
nRBC: 0 % (ref 0.0–0.2)

## 2021-05-15 MED ORDER — MONTELUKAST SODIUM 10 MG PO TABS: 10.0000 mg | ORAL_TABLET | Freq: Every day | ORAL | 1 refills | Status: AC

## 2021-05-15 MED ORDER — HYDROCODONE-ACETAMINOPHEN 5-325 MG PO TABS: 1.0000 | ORAL_TABLET | Freq: Two times a day (BID) | ORAL | 0 refills | Status: DC | PRN

## 2021-05-15 MED ORDER — HEPARIN SOD (PORK) LOCK FLUSH 100 UNIT/ML IV SOLN
500.0000 [IU] | Freq: Once | INTRAVENOUS | Status: AC
  Administered 2021-05-15: 500 [IU] via INTRAVENOUS
  Filled 2021-05-15: qty 5

## 2021-05-15 MED ORDER — SODIUM CHLORIDE 0.9% FLUSH
10.0000 mL | Freq: Once | INTRAVENOUS | Status: AC
  Administered 2021-05-15: 10 mL via INTRAVENOUS
  Filled 2021-05-15: qty 10

## 2021-05-15 NOTE — Assessment & Plan Note (Addendum)
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 Currently s/p palliative radiation to the pancreas;  currently s/p  Radiation [10/11]. CT scan Feb 11th, 2023- Interval increased low-density within the pancreatic head surrounding the biliary stent with soft tissue stranding in the adjacent retroperitoneal fat and mild duodenal wall thickening, attributed to radiation therapy. No measurable residual pancreatic head mass or definite metastases identified. New small volume of ascites without associated nodularity to suggest peritoneal carcinomatosis at this time.    #Given the overall stability of the disease/and poor tolerance of systemic therapy I think reasonable to continue surveillance at this time.  We will reevaluate with a CT scan in 3 months.  Further options include 5-FU-Onivyde.  Again await tumor marker.   # weight loss: Recommend continue  Megace- stable; refilled   # nasal congestion/ runny nose-recommend Singulair/Flonase.  # COPD- on Advair/  xopenex prn- STABLE  # Chronic Diarrhea/adominal pain - on nocro qhs prn- stable;   #chornic abodminal pain- ? Worse-with hx of recurrent biliary obstruction/elevated LFTs-s/p repeat ERCP [AUG 11th, Dr.Wohl]-stable; recommed eval with Dr.Wohl.   # Anxiety: continue xanax - 1 q 12 hours prn/refilled.   #Incidental findings on FEB, 2023-  Imaging dated:CT-biliary stents; portal vein narrowing; 3.9 cm abdominal aortic aneurysm aortic atherosclerosis I reviewed/discussed/counseled the patient.    # DISPOSITION:de-access # Follow up in 2  month- MD; labs- cbc/cmp;ca-19-9; port flush- Dr.B  # I reviewed the blood work- with the patient in detail; also reviewed the imaging independently [as summarized above]; and with the patient in detail.   Addendum: Patient's tumor marker slightly elevated.  We will recheck again.  Discussed with the  patient.

## 2021-05-15 NOTE — Progress Notes (Signed)
[facility] NOTE  Patient Care Team: Erma Heritage, MD as PCP - General (Family Medicine) Rico Junker, RN as Registered Nurse Theodore Demark, RN as Registered Nurse Ezzard Standing, PA-C as Physician Assistant (Gastroenterology) Bary Castilla, Forest Gleason, MD (General Surgery) Clent Jacks, RN as Oncology Nurse Navigator Shirley Sickle, MD as Consulting Physician (Hematology and Oncology)  CHIEF COMPLAINTS/PURPOSE OF CONSULTATION: Pancreatic cancer  #  Oncology History Overview Note  # MAY 2022-pancreatic adenocarcinoma [obstructive jaundice-]MRI-18 mm pancreatic head mass; EUS [[facility]; Dr.Spaete]-abutment of portal vein/SMV; no lymphadenopathy noted; May 5 CA 19-9-163; s/p stenting -May 25th-47.   #Obstructive jaundice-s/p ERCP stenting [Dr.Wohl]  MAY 2022- ENDOSONOGRAPHIC FINDING: : A round mass was identified in the pancreatic head.  The mass was hypoechoic. The mass measured 16 mm by 17  mm in maximal cross-sectional diameter. The outer  margins were irregular. There was sonographic evidence  suggesting invasion into the portal vein (manifested  by abutment) and the superior mesenteric vein  (manifested by abutment).   #Pancreatic adenocarcinoma pT1 however on EUS/CT [[facility]. Dr.Shah]-concerning for tumor abutting the celiac axis /common hepatic artery /portal vein/superior mesenteric vein; without any metastatic disease.  Borderline resectable Elevated CA 19-9.  [S/p evaluation at [place]  # June 9th- FOLFIRINOX cycle #1; discontinued because of poor GI tolerance  # &/01-2021-gemcitabine Abraxane [100mg /2] day 1 day 815  # July 11th-gemcitabine and Abraxane day.  November 15, 2020-CT scan progressive disease/poor tolerance to therapy  # AUG 29th, 2022-gemcitabine single agent weekly-concurrent radiation;  December 12, 2020-discontinued gemcitabine single agent because of significant respiratory distress post gemcitabine infusion.   Continue local radiation [finished OCT 2022] ---------------------------------------------------------      Malignant neoplasm of head of pancreas ([place])  08/24/2020 Initial Diagnosis   Malignant neoplasm of head of pancreas ([place])   09/09/2020 - 09/12/2020 Chemotherapy          10/10/2020 -  Chemotherapy    Patient is on Treatment Plan: PANCREATIC ABRAXANE / GEMCITABINE D1,8,15 Q28D          HISTORY OF PRESENTING ILLNESS: Caucasian female patient ambulating independently.  Accompanied by her son.  Shirley Barton 71 y.o.  female with a history of COPD; chronic abdominal pain-pancreatic cancer with poor tolerance to  chemotherapy-lmetastatic disease-currently on radiation to the pancreas on a palliative basis is here for follow-up/review results of the CT scan.  Patient finished radiation approximately 3 months ago.  Has intermittent abdominal pain which is currently resolved.  She is needing to take narcotic pain medication only as needed.  Complains of hot flashes.  Appetite is fair.  No worsening nausea vomiting.  Review of Systems  Constitutional:  Positive for malaise/fatigue and weight loss. Negative for chills, diaphoresis and fever.  HENT:  Negative for nosebleeds and sore throat.   Eyes:  Negative for double vision.  Respiratory:  Positive for shortness of breath. Negative for cough, hemoptysis, sputum production and wheezing.   Cardiovascular:  Negative for chest pain, palpitations, orthopnea and leg swelling.  Gastrointestinal:  Positive for abdominal pain and diarrhea. Negative for blood in stool, constipation, heartburn, melena and vomiting.  Genitourinary:  Negative for dysuria, frequency and urgency.  Musculoskeletal:  Negative for back pain and joint pain.  Skin: Negative.  Negative for itching and rash.  Neurological:  Negative for dizziness, tingling, focal weakness, weakness and headaches.  Endo/Heme/Allergies:  Does not bruise/bleed easily.   Psychiatric/Behavioral:  Negative for depression. The patient is not nervous/anxious and  does not have insomnia.     MEDICAL HISTORY:  Past Medical History:  Diagnosis Date   Abdominal pain, generalized    Anxiety    Atypical chest pain    Back pain    Basal cell carcinoma of skin 2011/06/24   resected from Left scalp area.    COPD (chronic obstructive pulmonary disease) (HCC)    Depression    DOE (dyspnea on exertion)    GERD (gastroesophageal reflux disease)    Hypertension    Pancreatic cancer ([place])    Shortness of breath dyspnea     SURGICAL HISTORY: Past Surgical History:  Procedure Laterality Date   BASAL CELL CARCINOMA EXCISION  06-24-11   CARPAL TUNNEL RELEASE Left 90s   CHOLECYSTECTOMY N/A 09/16/2015   Procedure: LAPAROSCOPIC CHOLECYSTECTOMY WITH INTRAOPERATIVE CHOLANGIOGRAM;  Surgeon: Robert Bellow, MD;  Location: [facility] ORS;  Service: General;  Laterality: N/A;   COLONOSCOPY  1990"s   [place]   ENDOSCOPIC RETROGRADE CHOLANGIOPANCREATOGRAPHY (ERCP) WITH PROPOFOL N/A 11/10/2020   Procedure: ENDOSCOPIC RETROGRADE CHOLANGIOPANCREATOGRAPHY (ERCP) WITH PROPOFOL;  Surgeon: Lucilla Lame, MD;  Location: [facility] ENDOSCOPY;  Service: Endoscopy;  Laterality: N/A;   ERCP N/A 08/05/2020   Procedure: ENDOSCOPIC RETROGRADE CHOLANGIOPANCREATOGRAPHY (ERCP);  Surgeon: Lucilla Lame, MD;  Location: [facility];  Service: Endoscopy;  Laterality: N/A;   ESOPHAGOGASTRODUODENOSCOPY (EGD) WITH PROPOFOL N/A 02/05/2017   Procedure: ESOPHAGOGASTRODUODENOSCOPY (EGD) WITH PROPOFOL;  Surgeon: Lollie Sails, MD;  Location: [facility] ENDOSCOPY;  Service: Endoscopy;  Laterality: N/A;   PERIPHERAL VASCULAR CATHETERIZATION Bilateral 12/27/2015   Procedure: Lower Extremity Angiography;  Surgeon: Katha Cabal, MD;  Location: [facility];  Service: Cardiovascular;  Laterality: Bilateral;   PORTA CATH INSERTION N/A 09/08/2020   Procedure: PORTA CATH INSERTION;  Surgeon: Algernon Huxley, MD;  Location:  [facility];  Service: Cardiovascular;  Laterality: N/A;   TONSILLECTOMY      SOCIAL HISTORY: Social History   Socioeconomic History   Marital status: Divorced    Spouse name: Not on file   Number of children: Not on file   Years of education: Not on file   Highest education level: Not on file  Occupational History   Occupation: retired  Tobacco Use   Smoking status: Former    Types: Cigarettes    Quit date: 03/02/2012    Years since quitting: 9.2   Smokeless tobacco: Never  Vaping Use   Vaping Use: Never used  Substance and Sexual Activity   Alcohol use: No    Alcohol/week: 0.0 standard drinks   Drug use: No   Sexual activity: Not on file  Other Topics Concern   Not on file  Social History Narrative   [place]; self; smoker- quit 06-24-2011; daughter died of leukemia-2020. 2 sons [[place]; [place]]; used to be caregiver/ retd.    Social Determinants of Health   Financial Resource Strain: Not on file  Food Insecurity: Not on file  Transportation Needs: Not on file  Physical Activity: Not on file  Stress: Not on file  Social Connections: Not on file  Intimate Partner Violence: Not on file    FAMILY HISTORY: Family History  Problem Relation Age of Onset   Cerebral palsy Daughter    Colon polyps Sister    Leukemia Daughter    Depression Son    Breast cancer Neg Hx     ALLERGIES:  is allergic to tramadol and penicillins.  MEDICATIONS:  Current Outpatient Medications  Medication Sig Dispense Refill   ALPRAZolam (XANAX) 0.25 MG  tablet Take 1 tablet (0.25 mg total) by mouth every 12 (twelve) hours as needed for anxiety. 45 tablet 0   B Complex-C-Folic Acid (B COMPLEX-VITAMIN C-FOLIC ACID) 1 MG tablet Take 1 tablet by mouth daily with breakfast. 30 tablet 0   diphenoxylate-atropine (LOMOTIL) 2.5-0.025 MG tablet Take 1 tablet by mouth 4 (four) times daily as needed for diarrhea or loose stools. 60 tablet 1   Fluticasone-Salmeterol (ADVAIR) 100-50 MCG/DOSE AEPB  Inhale 1 puff into the lungs 2 (two) times daily.     hydrOXYzine (ATARAX) 25 MG tablet TAKE 1 TABLET BY MOUTH 3 TIMES DAILY AS NEEDED FOR ITCHING. 90 tablet 1   KLOR-CON M20 20 MEQ tablet TAKE 1 TABLET BY MOUTH TWICE A DAY 60 tablet 0   levalbuterol (XOPENEX HFA) 45 MCG/ACT inhaler Inhale 1-2 puffs into the lungs every 6 (six) hours as needed for wheezing or shortness of breath. 1 each 12   lidocaine-prilocaine (EMLA) cream Apply 30 -45 mins prior to port access. 30 g 0   loratadine (CLARITIN) 10 MG tablet Take 10 mg by mouth daily.     montelukast (SINGULAIR) 10 MG tablet Take 1 tablet (10 mg total) by mouth at bedtime. 30 tablet 1   ondansetron (ZOFRAN) 8 MG tablet One pill every 8 hours as needed for nausea/vomitting. 40 tablet 3   pantoprazole (PROTONIX) 40 MG tablet TAKE 1 TABLET BY MOUTH EVERY DAY 90 tablet 0   prochlorperazine (COMPAZINE) 10 MG tablet Take 1 tablet (10 mg total) by mouth every 6 (six) hours as needed for nausea or vomiting. 40 tablet 3   acetaminophen (TYLENOL) 325 MG tablet Take 650 mg by mouth every 6 (six) hours as needed for mild pain or moderate pain. (Patient not taking: Reported on 04/18/2021)     Cetirizine HCl (ZYRTEC ALLERGY PO) Take 1 tablet by mouth daily as needed (allergies). (Patient not taking: Reported on 04/18/2021)     HYDROcodone-acetaminophen (NORCO) 5-325 MG tablet Take 1 tablet by mouth every 12 (twelve) hours as needed for moderate pain. 60 tablet 0   ipratropium-albuterol (DUONEB) 0.5-2.5 (3) MG/3ML SOLN Take 3 mLs by nebulization every 4 (four) hours as needed. 360 mL 0   magic mouthwash w/lidocaine SOLN Take 10 mLs by mouth every 2 (two) hours as needed for mouth pain. (Patient not taking: Reported on 04/18/2021) 360 mL 0   megestrol (MEGACE ES) 625 MG/5ML suspension Take 5 mLs (625 mg total) by mouth daily. (Patient not taking: Reported on 04/18/2021) 150 mL 0   No current facility-administered medications for this visit.      Marland Kitchen  PHYSICAL  EXAMINATION: ECOG PERFORMANCE STATUS: 0 - Asymptomatic  Vitals:   05/15/21 1050  BP: (!) 149/92  Pulse: 97  Temp: 98.6 F (37 C)  SpO2: 100%    Filed Weights   05/15/21 1050  Weight: 100 lb 9.6 oz (45.6 kg)     Physical Exam HENT:     Head: Normocephalic and atraumatic.     Mouth/Throat:     Pharynx: No oropharyngeal exudate.  Eyes:     Pupils: Pupils are equal, round, and reactive to light.  Cardiovascular:     Rate and Rhythm: Normal rate and regular rhythm.  Pulmonary:     Effort: No respiratory distress.     Breath sounds: No wheezing.     Comments: Decreased air entry bilaterally.  No wheeze or crackles Abdominal:     General: Bowel sounds are normal. There is no distension.  Palpations: Abdomen is soft. There is no mass.     Tenderness: There is no abdominal tenderness. There is no guarding or rebound.  Musculoskeletal:        General: No tenderness. Normal range of motion.     Cervical back: Normal range of motion and neck supple.  Skin:    General: Skin is warm.  Neurological:     Mental Status: She is alert and oriented to person, place, and time.  Psychiatric:        Mood and Affect: Affect normal.     LABORATORY DATA:  I have reviewed the data as listed Lab Results  Component Value Date   WBC 6.5 05/15/2021   HGB 12.4 05/15/2021   HCT 38.7 05/15/2021   MCV 89.4 05/15/2021   PLT 146 (L) 05/15/2021   Recent Labs    08/04/20 1138 08/06/20 0457 01/16/21 0843 02/13/21 0955 05/12/21 1047 05/15/21 1011  NA  --    < > 137 136  --  135  K  --    < > 3.6 4.2  --  3.7  CL  --    < > 103 100  --  101  CO2  --    < > 25 27  --  27  GLUCOSE  --    < > 79 65*  --  103*  BUN  --    < > 9 14  --  7*  CREATININE  --    < > 0.51 0.63 0.60 0.55  CALCIUM  --    < > 9.5 9.6  --  9.1  GFRNONAA  --    < > >60 >60  --  >60  PROT  --    < > 7.0 8.8*  --  7.3  ALBUMIN  --    < > 3.7 4.7  --  3.7  AST  --    < > 19 25  --  20  ALT  --    < > 12 14  --   13  ALKPHOS  --    < > 46 67  --  80  BILITOT  --    < > 0.6 0.7  --  0.6  BILIDIR 3.7*  --   --   --   --   --    < > = values in this interval not displayed.    RADIOGRAPHIC STUDIES: I have personally reviewed the radiological images as listed and agreed with the findings in the report. CT ABDOMEN PELVIS W CONTRAST  Result Date: 05/14/2021 CLINICAL DATA:  Pancreatic cancer diagnosed in May 2022. Previous biliary stent placement, chemotherapy and radiation therapy. Restaging. Assess treatment response. EXAM: CT ABDOMEN AND PELVIS WITH CONTRAST TECHNIQUE: Multidetector CT imaging of the abdomen and pelvis was performed using the standard protocol following bolus administration of intravenous contrast. RADIATION DOSE REDUCTION: This exam was performed according to the departmental dose-optimization program which includes automated exposure control, adjustment of the mA and/or kV according to patient size and/or use of iterative reconstruction technique. CONTRAST:  47mL OMNIPAQUE IOHEXOL 300 MG/ML  SOLN COMPARISON:  Abdominopelvic CT 02/08/2021 and 11/15/2020. Abdominal MRI 11/21/2020. FINDINGS: Lower chest: Clear lung bases. No significant pleural or pericardial effusion. Emphysematous changes are again noted at both lung bases. There is a small hiatal hernia with associated mild wall thickening. Hepatobiliary: The liver is normal in density without suspicious focal abnormality. Small transient hepatic attenuation differences (incidental vascular shunts) are again noted peripherally  in the right lobe. Metallic biliary stent is unchanged in position. There is stable pneumobilia and mild biliary dilatation. Pancreas: There is ill-defined low-density within the pancreatic head surrounding the biliary stent with soft tissue stranding in the surrounding retroperitoneal fat. These findings are attributed to previous radiation therapy. No well-defined residual mass lesion is identified. There is no pancreatic  ductal dilatation or abnormality within the pancreatic body or tail. Spleen: Normal in size without focal abnormality. Adrenals/Urinary Tract: Both adrenal glands appear normal. The kidneys appear normal without evidence of urinary tract calculus, suspicious lesion or hydronephrosis. No bladder abnormalities are seen. Stomach/Bowel: Enteric contrast was administered and has passed into the distal colon. There is some wall thickening of the distal stomach and duodenum, attributed to prior radiation therapy. The remainder of the small bowel, appendix and colon appear normal. There is mildly prominent stool in the distal colon. Vascular/Lymphatic: There are no enlarged abdominal or pelvic lymph nodes. Stable small lymph nodes in the gastrohepatic ligament. Again demonstrated is diffuse aortic and branch vessel atherosclerosis with in a regular infrarenal abdominal aortic aneurysm measuring 3.9 x 3.6 cm on image 37/2 (previously 3.7 x 3.2 cm). Bilateral common iliac stents are in place. There is persistent focal narrowing of the portal vein posterior to the pancreatic head with adjacent collateral vessels. No evidence of acute venous thrombosis. Reproductive: Uterine atrophy.  No evidence of adnexal mass. Other: New small volume pelvic ascites without peritoneal nodularity to suggest carcinomatosis at this time. Musculoskeletal: No acute or significant osseous findings. Mild spondylosis. IMPRESSION: 1. Interval increased low-density within the pancreatic head surrounding the biliary stent with soft tissue stranding in the adjacent retroperitoneal fat and mild duodenal wall thickening, attributed to radiation therapy. No measurable residual pancreatic head mass or definite metastases identified. 2. New small volume of ascites without associated nodularity to suggest peritoneal carcinomatosis at this time. 3. The biliary stent is unchanged in position and remains patent with stable pneumobilia and mild biliary dilatation.  4. Unchanged focal narrowing of the portal vein posterior to the pancreatic head which may relate to the pancreatic malignancy or subsequent treatment. No evidence of acute vascular thrombosis. 5. 3.9 cm abdominal aortic aneurysm, minimally larger than previous studies. Extensive associated mural thrombus. Recommend follow-up ultrasound every 2 years if not otherwise imaged. This recommendation follows ACR consensus guidelines: White Paper of the ACR Incidental Findings Committee II on Vascular Findings. J Am Coll Radiol 2013; 10:789-794. 6. Aortic Atherosclerosis (ICD10-I70.0) and Emphysema (ICD10-J43.9). Electronically Signed   By: Richardean Sale M.D.   On: 05/14/2021 12:14     ASSESSMENT & PLAN:   Malignant neoplasm of head of pancreas [facility]) # Pancreatic adenocarcinoma/locally advanced/metastatic disease [question liver lesions; abdominal/retroperitoneal adenopathy-CT scan November 15, 2020]. Currently s/p palliative radiation to the pancreas;  currently s/p  Radiation [10/11]. CT scan Feb 11th, 2023- Interval increased low-density within the pancreatic head surrounding the biliary stent with soft tissue stranding in the adjacent retroperitoneal fat and mild duodenal wall thickening, attributed to radiation therapy. No measurable residual pancreatic head mass or definite metastases identified. New small volume of ascites without associated nodularity to suggest peritoneal carcinomatosis at this time.    #Given the overall stability of the disease/and poor tolerance of systemic therapy I think reasonable to continue surveillance at this time.  We will reevaluate with a CT scan in 3 months.  Further options include 5-FU-Onivyde.  Again await tumor marker.    # weight loss: Recommend continue  Megace- stable; refilled   #  nasal congestion/ runny nose-recommend Singulair/Flonase.  # COPD- on Advair/  xopenex prn- STABLE  # Chronic Diarrhea/adominal pain - on nocro qhs prn- stable;   #chornic  abodminal pain- ? Worse-with hx of recurrent biliary obstruction/elevated LFTs-s/p repeat ERCP [AUG 11th, Dr.Wohl]-stable; recommed eval with Dr.Wohl.   # Anxiety: continue xanax - 1 q 12 hours prn/refilled.   #Incidental findings on FEB, 2023-  Imaging dated:CT-biliary stents; portal vein narrowing; 3.9 cm abdominal aortic aneurysm aortic atherosclerosis I reviewed/discussed/counseled the patient.     # DISPOSITION:de-access # Follow up in 2  month- MD; labs- cbc/cmp;ca-19-9; port flush- Dr.B  # I reviewed the blood work- with the patient in detail; also reviewed the imaging independently [as summarized above]; and with the patient in detail.   Addendum: Patient's tumor marker slightly elevated.  We will recheck again.  Discussed with the patient.      All questions were answered. The patient knows to call the clinic with any problems, questions or concerns.    Shirley Sickle, MD 05/29/2021 2:23 PM

## 2021-05-15 NOTE — Progress Notes (Signed)
Pt states she has a lot of nasal drip and congestion.  Refill on hydrocodone.  Pt states when she eats, her stomach gets bloated and has nausea.  Having rt abd. Pain.

## 2021-05-16 ENCOUNTER — Other Ambulatory Visit: Payer: Self-pay | Admitting: Internal Medicine

## 2021-05-16 ENCOUNTER — Encounter: Payer: Self-pay | Admitting: Internal Medicine

## 2021-05-16 ENCOUNTER — Telehealth: Payer: Self-pay

## 2021-05-16 DIAGNOSIS — C250 Malignant neoplasm of head of pancreas: Secondary | ICD-10-CM

## 2021-05-16 LAB — CANCER ANTIGEN 19-9: CA 19-9: 141 U/mL — ABNORMAL HIGH (ref 0–35)

## 2021-05-16 NOTE — Telephone Encounter (Signed)
-----   Message from [place] sent at 05/15/2021  1:14 PM EST ----- Ask about this patient

## 2021-05-16 NOTE — Progress Notes (Signed)
I left a message for the patient to call us back to discuss the results of her blood work/cancer marker.

## 2021-05-16 NOTE — Telephone Encounter (Signed)
Patient called on 05/15/21 and states her oncologist wanted her to call Dr. Allen Norris to make a appointment due to the radiation damaging her stomach. Made her a appointment for 08/01/21. After getting off the phone with patient it look like patient had been seeing [facility]. Called patient and states she has been seeing them but Dr. Allen Norris did her ERCP. Informed patient she would be a new patient because he only did the procedure. She states she is going to call [facility] to see if they can get patient in sooner. She will give [place] a call back to cancel the appointment if they can

## 2021-05-17 ENCOUNTER — Other Ambulatory Visit: Payer: Self-pay | Admitting: *Deleted

## 2021-05-17 ENCOUNTER — Other Ambulatory Visit: Payer: Self-pay | Admitting: Internal Medicine

## 2021-05-17 MED ORDER — HYDROCODONE-ACETAMINOPHEN 5-325 MG PO TABS: 1.0000 | ORAL_TABLET | Freq: Two times a day (BID) | ORAL | 0 refills | Status: DC | PRN

## 2021-05-17 NOTE — Telephone Encounter (Signed)
Appt made for 06/14/21.

## 2021-05-17 NOTE — Telephone Encounter (Signed)
Colette, please schedule as MD recommends and inform pt of appt details.    Lab order entered.

## 2021-05-17 NOTE — Telephone Encounter (Signed)
[facility] does not have [facility] in stock and does not know if they will be able to get it back in stock any time soon. Please send prescription to [facility] she called and they have it in stock

## 2021-05-23 ENCOUNTER — Inpatient Hospital Stay: Payer: Medicare HMO

## 2021-05-23 ENCOUNTER — Other Ambulatory Visit: Payer: Self-pay

## 2021-05-23 NOTE — Progress Notes (Signed)
Nutrition Follow-up:  Patient with pancreatic cancer.  Patient currently on surveillance.    Spoke with patient via phone.  Patient reports that her appetite is not good.  Says that her stomach hurts and keeps her from eating.  She has not noticed any particular foods that make her symptoms worse.  She is still trying to eat something and drinking boost VHC shakes.  Reports constipation and no bowel movement for the last 5-6 days.  She is currently not taking any stool softners or miralax.  Says that she took a dulcolax several weeks ago for same issue.  Patient asking what she needs to take to have a bowel movement.    Medications: reviewed  Labs: reviewed  Anthropometrics:   Weight 100 lb 9.6 oz on 2/13 100 lb 6.4 oz on 11/18 97 lb 3.2 oz on 10/17 92 lb on 8/15 98 lb on 7/11 100 lb on 6/20   NUTRITION DIAGNOSIS: Inadequate oral intake ongoing    INTERVENTION:  Message sent to MD and team regarding patient request of medication to help with constipation.   Discussed strategies to help with constipation.      MONITORING, EVALUATION, GOAL: weight trends, intake   NEXT VISIT: March 16, telephone visit  Shirley Barton B. Zenia Resides, [place], [place] Registered Dietitian [phone] (mobile)

## 2021-05-24 ENCOUNTER — Telehealth: Payer: Self-pay

## 2021-05-24 NOTE — Telephone Encounter (Signed)
-----   Message from Jennet [place], [place] sent at 05/23/2021 10:36 AM EST ----- Dr B and team,  Followed up with Shirley Barton today via phone.  She says that she has not had a bowel movement in 5-6 days.  She feels constipated and currently not taking any stool softner or miralax.  She said that she did take a dulcolax several weeks ago due to constipation.  She is asking what she can take.  Can you please touch base with her about a bowel regimen?  She said that she will be seeing GI on 2/28.  I talked to her about diet things she can do to help with constipation.    Thank you, Joli

## 2021-05-24 NOTE — Telephone Encounter (Signed)
Message received from [place], dietician at [facility].  Dr. Jacinto Reap, what do  you recommend for patient's current constipation?

## 2021-05-24 NOTE — Telephone Encounter (Signed)
MyChart message sent to patient with MD recommendations.

## 2021-05-29 ENCOUNTER — Encounter: Payer: Self-pay | Admitting: Internal Medicine

## 2021-05-31 ENCOUNTER — Encounter: Payer: Self-pay | Admitting: Internal Medicine

## 2021-05-31 NOTE — Progress Notes (Signed)
I spoke to patient regarding the concerns for slightly elevated cancer marker, liver numbers. Recommend MRI/MRCP to better evaluate biliary system/pancreas.  ? ?Patient in agreement. Patient has follow up appointment.  In second week of March.  ?GB

## 2021-06-01 ENCOUNTER — Other Ambulatory Visit: Payer: Self-pay | Admitting: Internal Medicine

## 2021-06-01 DIAGNOSIS — R7989 Other specified abnormal findings of blood chemistry: Secondary | ICD-10-CM

## 2021-06-01 DIAGNOSIS — C250 Malignant neoplasm of head of pancreas: Secondary | ICD-10-CM

## 2021-06-01 NOTE — Progress Notes (Signed)
Patient is aware of plan for MRI to be scheduled. ?

## 2021-06-01 NOTE — Progress Notes (Signed)
I ordered STAT MRI abdomen-  ?C-please schedule. ?GB ?

## 2021-06-02 ENCOUNTER — Ambulatory Visit
Admission: RE | Admit: 2021-06-02 | Discharge: 2021-06-02 | Disposition: A | Discharge status: Home / Self Care | Payer: Medicare HMO | Source: Ambulatory Visit | Attending: Internal Medicine | Admitting: Internal Medicine

## 2021-06-02 ENCOUNTER — Other Ambulatory Visit: Payer: Self-pay

## 2021-06-02 DIAGNOSIS — R7989 Other specified abnormal findings of blood chemistry: Secondary | ICD-10-CM | POA: Diagnosis present

## 2021-06-02 DIAGNOSIS — C250 Malignant neoplasm of head of pancreas: Secondary | ICD-10-CM | POA: Insufficient documentation

## 2021-06-02 IMAGING — MR MR ABDOMEN WO/W CM MRCP
19 of 21 series · 44 of 48 positions shown · IV contrast (5ml Gadavist)
Comparison: Abdomen/pelvis CT [DATE].  Abdomen MRI [DATE]

CLINICAL DATA: Jaundice with pancreatitis and elevated LFTs.
Pancreatic cancer diagnosed [DATE].

EXAM:
MRI ABDOMEN WITHOUT AND WITH CONTRAST (INCLUDING MRCP)
TECHNIQUE: Multiplanar multisequence MR imaging of the abdomen was performed
both before and after the administration of intravenous contrast.
Heavily T2-weighted images of the biliary and pancreatic ducts were
obtained, and three-dimensional MRCP images were rendered by post
processing.
CONTRAST:  7.5mL GADAVIST GADOBUTROL 1 MMOL/ML IV SOLN

[Series 3: T2 · coronal · 6.0mm · 1.19mm/px · 1 of 26 slices shown (1 of 2)]
[im 1/26]
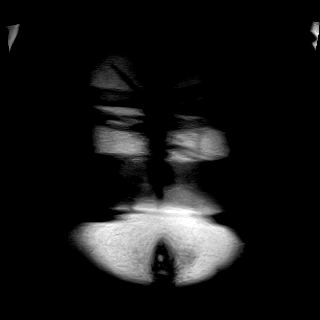

[Series 4: T2 · axial · 6.0mm · 1.19mm/px · 1 of 30 slices shown (2 of 2)]
[im 1/30]
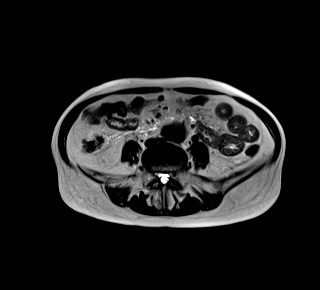

[Series 5: T1 · axial · 3.0mm · 1.19mm/px · z∈[-92,+121]mm · 2 of 72 slices shown (1 of 2)]
[im 1/72]
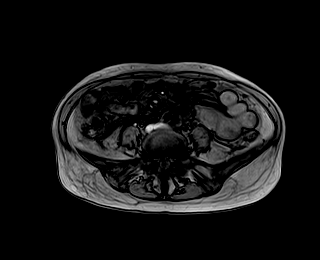
[im 72/72]
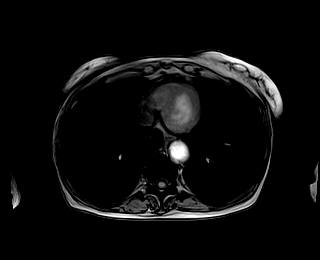

[Series 6: T1 · axial · 3.0mm · 1.19mm/px · z∈[-92,+121]mm · 2 of 72 slices shown (2 of 2)]
[im 1/72]
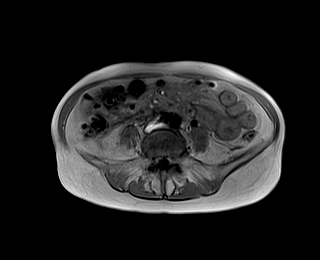
[im 72/72]
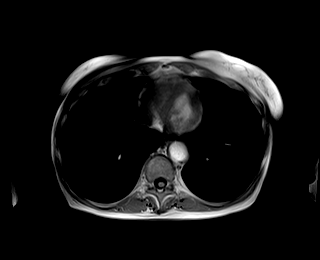

[Series 9: T2 fat-sat · axial · 6.0mm · 1.19mm/px · 1 of 30 slices shown]
[im 1/30]
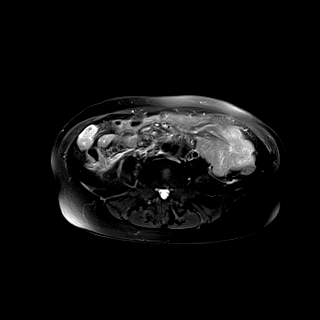

[Series 10: ax dwi_tracew · axial · 6.0mm · 1.42mm/px · z∈[-90,+119]mm · 4 of 90 slices shown]
[im 1/90]
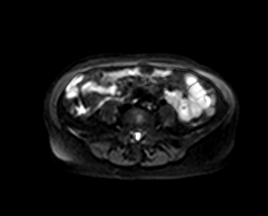
[im 30/90]
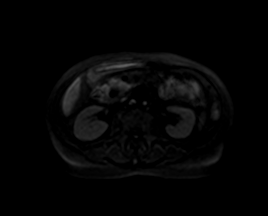
[im 60/90]
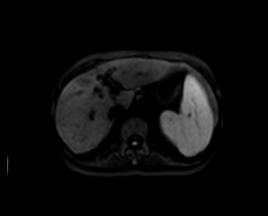
[im 90/90]
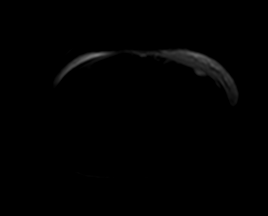

[Series 11: ax dwi_adc · axial · 6.0mm · 1.42mm/px · 1 of 30 slices shown]
[im 1/30]
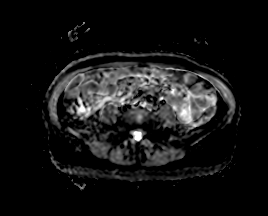

[Series 15: MRCP · coronal · 3.0mm · 1.12mm/px · 1 of 20 slices shown]
[im 1/20]
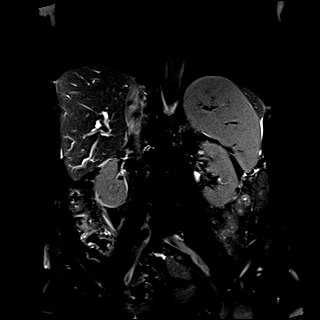

[Series 16: radials · coronal · 50.0mm · 0.78mm/px · 1 of 5 slices shown]
[im 1/5]
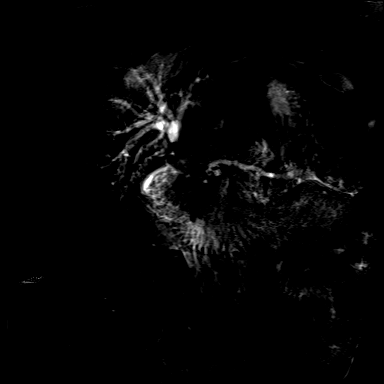

[Series 17: T1 dynamic fat-sat · axial · non-contrast · 3.0mm · 1.19mm/px · z∈[-90,+99]mm · 3 of 64 slices shown (1 of 5)]
[im 1/64]
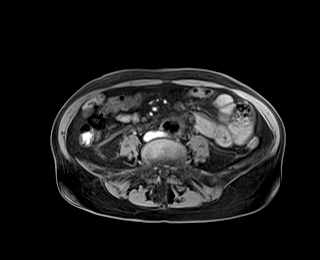
[im 32/64]
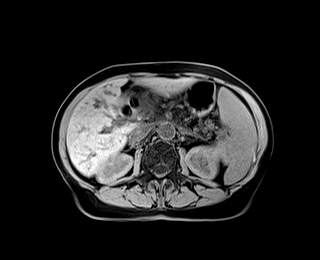
[im 64/64]
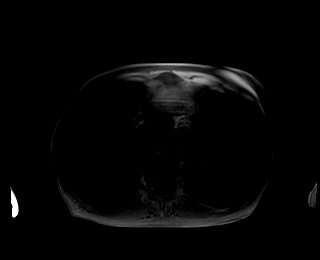

[Series 18: T1 dynamic fat-sat post-contrast · axial · 3.0mm · 1.19mm/px · z∈[-90,+99]mm · 3 of 64 slices shown (1 of 4)]
[im 1/64]
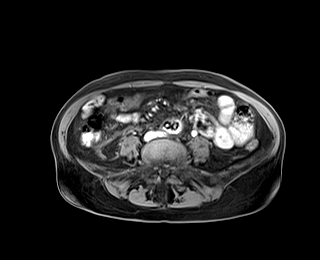
[im 32/64]
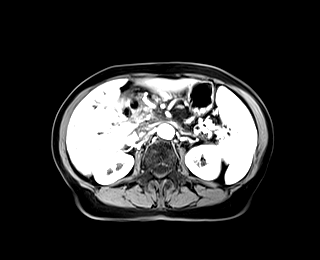
[im 64/64]
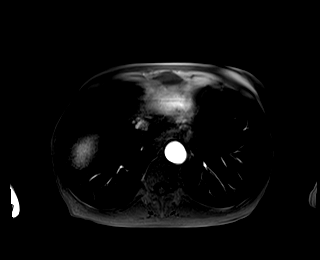

[Series 19: T1 dynamic fat-sat · axial · 3.0mm · 1.19mm/px · z∈[-90,+99]mm · 3 of 64 slices shown (2 of 5)]
[im 1/64]
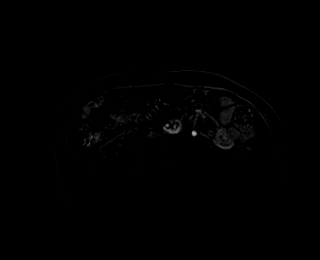
[im 32/64]
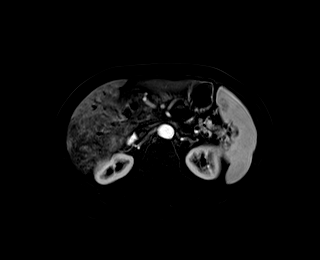
[im 64/64]
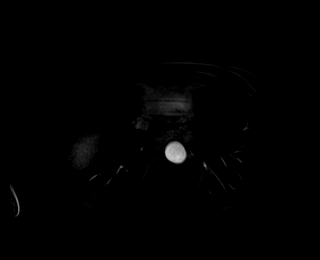

[Series 20: T1 dynamic fat-sat post-contrast · axial · 3.0mm · 1.19mm/px · z∈[-90,+99]mm · 3 of 64 slices shown (2 of 4)]
[im 1/64]
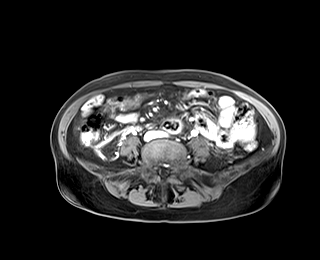
[im 32/64]
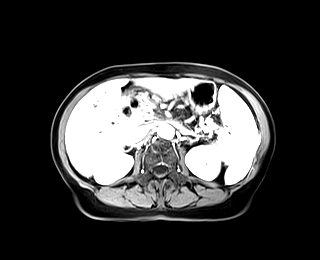
[im 64/64]
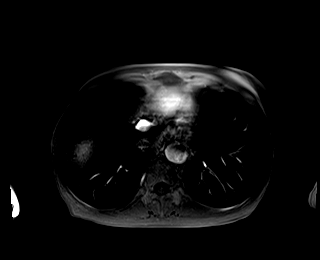

[Series 21: T1 dynamic fat-sat · axial · 3.0mm · 1.19mm/px · z∈[-90,+99]mm · 3 of 64 slices shown (3 of 5)]
[im 1/64]
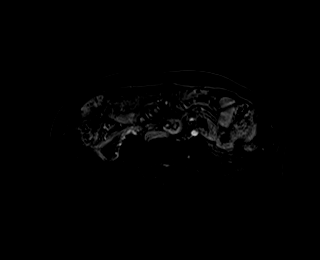
[im 32/64]
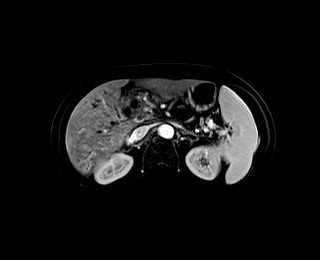
[im 64/64]
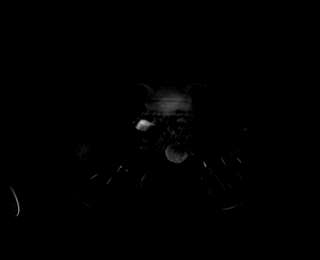

[Series 22: T1 dynamic fat-sat post-contrast · axial · 3.0mm · 1.19mm/px · z∈[-90,+99]mm · 3 of 64 slices shown (3 of 4)]
[im 1/64]
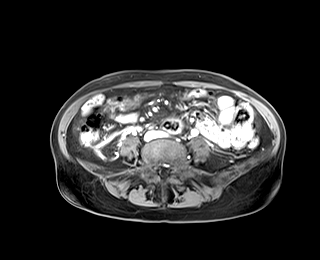
[im 32/64]
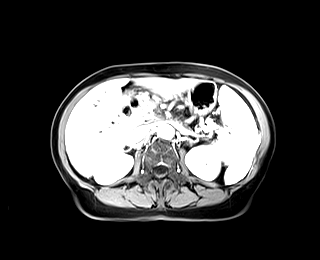
[im 64/64]
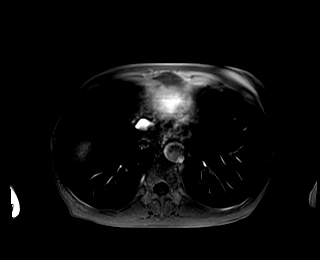

[Series 23: T1 dynamic fat-sat · axial · 3.0mm · 1.19mm/px · z∈[-90,+99]mm · 3 of 64 slices shown (4 of 5)]
[im 1/64]
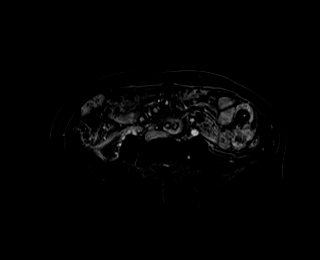
[im 32/64]
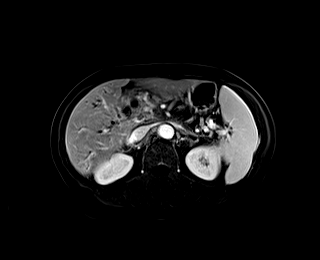
[im 64/64]
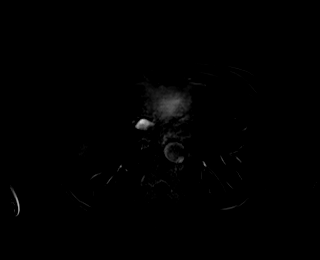

[Series 24: T1 dynamic post-contrast · coronal · 3.0mm · 1.12mm/px · 3 of 64 slices shown]
[im 1/64]
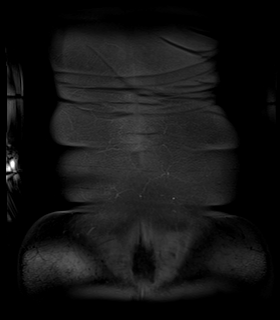
[im 32/64]
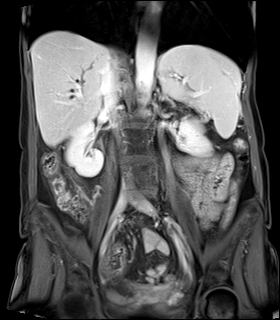
[im 64/64]
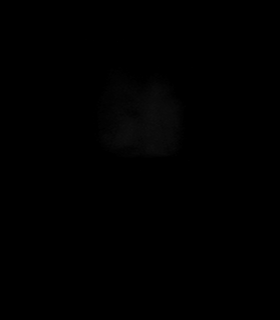

[Series 25: T1 dynamic fat-sat post-contrast · axial · 3.0mm · 1.19mm/px · z∈[-90,+99]mm · 3 of 64 slices shown (4 of 4)]
[im 1/64]
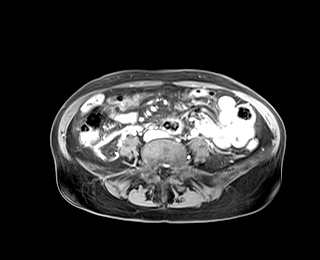
[im 32/64]
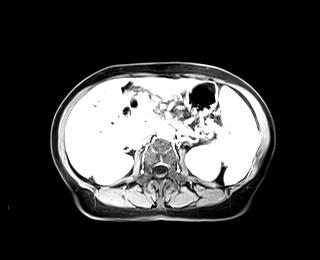
[im 64/64]
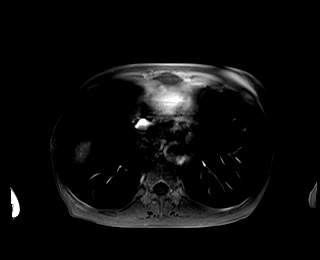

[Series 26: T1 dynamic fat-sat · axial · 3.0mm · 1.19mm/px · z∈[-90,+99]mm · 3 of 64 slices shown (5 of 5)]
[im 1/64]
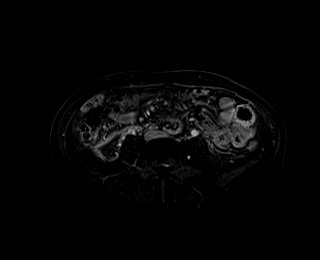
[im 32/64]
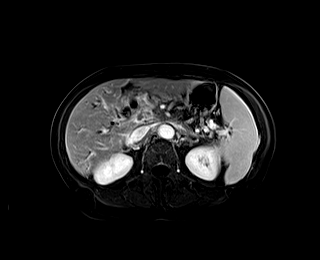
[im 64/64]
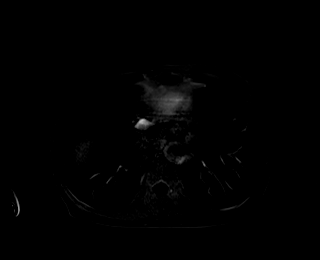

[44 of 48 positions shown; findings below may reference images not displayed]

FINDINGS: Lower chest: Unremarkable.

Hepatobiliary: Heterogeneous asymmetric perfusion of the right
hepatic lobe noted on arterial phase imaging which becomes more
homogeneous on later phases and is homogeneous in similar to left
hepatic lobe perfusion by 90 seconds. The is similar to the
[DATE] MRI. No evidence for focal discrete mass lesion within
the hepatic parenchyma. There is diffuse intrahepatic biliary duct
dilatation mildly progressive since previous MR but similar to
recent CT. There is abrupt tapering of the common duct at or just
proximal to the proximal end of the biliary stent. Gallbladder
surgically absent.

Pancreas: Pancreatic parenchyma in the body and tail is atrophic
with prominence of the main pancreatic duct, similar to prior.
Ill-defined soft tissue fullness again noted in the pancreatic head
with mild differential hypoperfusion on early postcontrast imaging
although no discrete mass lesion is evident.

Spleen:  No splenomegaly. No focal mass lesion.

Adrenals/Urinary Tract: No adrenal nodule or mass. Tiny cyst noted
lower pole right kidney. Left kidney unremarkable.

Stomach/Bowel: Stomach is unremarkable. No gastric wall thickening.
No evidence of outlet obstruction. Duodenum is normally positioned
as is the ligament of Treitz. No small bowel or colonic dilatation
within the visualized abdomen.

Vascular/Lymphatic: Fusiform infrarenal abdominal aortic aneurysm
measures up 3.7 cm diameter. Marked attenuation of the portal vein
identified just past the portal splenic confluence with an apparent
intraluminal filling defect measuring about 7 mm just proximal to
the bifurcation into the right and left portal venous anatomy (see
postcontrast image 31 of 92nd series 22). There is no gastrohepatic
or hepatoduodenal ligament lymphadenopathy. No retroperitoneal or
mesenteric lymphadenopathy.

Other: No substantial intraperitoneal free fluid. There is diffuse
edema in the mesentery and retroperitoneal spaces of the abdomen.

Musculoskeletal: No focal suspicious marrow enhancement within the
visualized bony anatomy.
IMPRESSION: 1. Mild intrahepatic biliary duct dilatation with abrupt tapering of
the common duct at or just proximal to the proximal end of the
biliary stent. This is certainly progressive since MRI [DATE]
and also appears progressive since [DATE]. Correlation with LFTs
may prove helpful. Stricture/occlusion at or just proximal to the
common bile duct is a consideration.
2. Ill-defined soft tissue fullness in the pancreatic head with mild
differential hypoperfusion on early postcontrast imaging although no
discrete mass lesion is evident.
3. Marked attenuation of the portal vein just past the portal
splenic confluence, similar to prior with new intraluminal filling
defect measuring about 7 mm just proximal to the bifurcation into
the right and left portal venous anatomy. Imaging features
compatible with portal vein thrombus
4. 3.7 cm infrarenal abdominal aortic aneurysm.

## 2021-06-02 MED ORDER — GADOBUTROL 1 MMOL/ML IV SOLN
5.0000 mL | Freq: Once | INTRAVENOUS | Status: AC | PRN
  Administered 2021-06-02: 7.5 mL via INTRAVENOUS

## 2021-06-05 ENCOUNTER — Other Ambulatory Visit: Payer: Self-pay | Admitting: Gastroenterology

## 2021-06-05 DIAGNOSIS — C250 Malignant neoplasm of head of pancreas: Secondary | ICD-10-CM

## 2021-06-06 ENCOUNTER — Encounter
Admission: RE | Disposition: A | Discharge status: Home / Self Care | Payer: Self-pay | Source: Home / Self Care | Attending: Gastroenterology

## 2021-06-06 ENCOUNTER — Ambulatory Visit: Payer: Medicare HMO

## 2021-06-06 ENCOUNTER — Ambulatory Visit
Admission: RE | Admit: 2021-06-06 | Discharge: 2021-06-06 | Disposition: A | Discharge status: Home / Self Care | Payer: Medicare HMO | Attending: Gastroenterology | Admitting: Gastroenterology

## 2021-06-06 ENCOUNTER — Encounter: Payer: Self-pay | Admitting: Gastroenterology

## 2021-06-06 ENCOUNTER — Ambulatory Visit: Payer: Medicare HMO | Admitting: Certified Registered Nurse Anesthetist

## 2021-06-06 ENCOUNTER — Other Ambulatory Visit: Payer: Self-pay | Admitting: Internal Medicine

## 2021-06-06 DIAGNOSIS — K219 Gastro-esophageal reflux disease without esophagitis: Secondary | ICD-10-CM | POA: Insufficient documentation

## 2021-06-06 DIAGNOSIS — Z87891 Personal history of nicotine dependence: Secondary | ICD-10-CM | POA: Insufficient documentation

## 2021-06-06 DIAGNOSIS — F418 Other specified anxiety disorders: Secondary | ICD-10-CM | POA: Insufficient documentation

## 2021-06-06 DIAGNOSIS — K831 Obstruction of bile duct: Secondary | ICD-10-CM

## 2021-06-06 DIAGNOSIS — J449 Chronic obstructive pulmonary disease, unspecified: Secondary | ICD-10-CM | POA: Insufficient documentation

## 2021-06-06 HISTORY — PX: ERCP: SHX5425

## 2021-06-06 SURGERY — ERCP, WITH INTERVENTION IF INDICATED
Anesthesia: General

## 2021-06-06 MED ORDER — APIXABAN 5 MG PO TABS: 5.0000 mg | ORAL_TABLET | Freq: Two times a day (BID) | ORAL | 5 refills | Status: DC

## 2021-06-06 MED ORDER — SODIUM CHLORIDE 0.9 % IV SOLN: INTRAVENOUS | Status: DC | PRN

## 2021-06-06 MED ORDER — HEPARIN SOD (PORK) LOCK FLUSH 100 UNIT/ML IV SOLN
INTRAVENOUS | Status: AC
  Filled 2021-06-06: qty 5

## 2021-06-06 MED ORDER — PROPOFOL 10 MG/ML IV BOLUS
INTRAVENOUS | Status: DC | PRN
  Administered 2021-06-06: 20 mg via INTRAVENOUS
  Administered 2021-06-06: 10 mg via INTRAVENOUS
  Administered 2021-06-06: 50 mg via INTRAVENOUS

## 2021-06-06 MED ORDER — PROPOFOL 500 MG/50ML IV EMUL
INTRAVENOUS | Status: DC | PRN
  Administered 2021-06-06: 145 ug/kg/min via INTRAVENOUS

## 2021-06-06 MED ORDER — LACTATED RINGERS IV SOLN: INTRAVENOUS | Status: DC

## 2021-06-06 MED ORDER — GLYCOPYRROLATE 0.2 MG/ML IJ SOLN
INTRAMUSCULAR | Status: DC | PRN
  Administered 2021-06-06: .2 mg via INTRAVENOUS

## 2021-06-06 NOTE — H&P (Signed)
? ?Lucilla Lame, MD [facility] ?[address] ?[place], [place] [address] ?Phone:[phone] ?Fax : [phone] ? ?Primary Care Physician:  Erma Heritage, MD ?Primary Gastroenterologist:  Dr. Allen Norris ? ?Pre-Procedure History & Physical: ?HPI:  Shirley Barton is a 72 y.o. female is here for an ERCP. ?  ?Past Medical History:  ?Diagnosis Date  ? Abdominal pain, generalized   ? Anxiety   ? Atypical chest pain   ? Back pain   ? Basal cell carcinoma of skin 2013  ? resected from Left scalp area.   ? COPD (chronic obstructive pulmonary disease) ([place])   ? Depression   ? DOE (dyspnea on exertion)   ? GERD (gastroesophageal reflux disease)   ? Hypertension   ? Pancreatic cancer ([place])   ? Shortness of breath dyspnea   ? ? ?Past Surgical History:  ?Procedure Laterality Date  ? BASAL CELL CARCINOMA EXCISION  2013  ? CARPAL TUNNEL RELEASE Left 90s  ? CHOLECYSTECTOMY N/A 09/16/2015  ? Procedure: LAPAROSCOPIC CHOLECYSTECTOMY WITH INTRAOPERATIVE CHOLANGIOGRAM;  Surgeon: Robert Bellow, MD;  Location: [facility] ORS;  Service: General;  Laterality: N/A;  ? COLONOSCOPY  1990"s  ? [place]  ? ENDOSCOPIC RETROGRADE CHOLANGIOPANCREATOGRAPHY (ERCP) WITH PROPOFOL N/A 11/10/2020  ? Procedure: ENDOSCOPIC RETROGRADE CHOLANGIOPANCREATOGRAPHY (ERCP) WITH PROPOFOL;  Surgeon: Lucilla Lame, MD;  Location: [facility] ENDOSCOPY;  Service: Endoscopy;  Laterality: N/A;  ? ERCP N/A 08/05/2020  ? Procedure: ENDOSCOPIC RETROGRADE CHOLANGIOPANCREATOGRAPHY (ERCP);  Surgeon: Lucilla Lame, MD;  Location: [facility] ENDOSCOPY;  Service: Endoscopy;  Laterality: N/A;  ? ESOPHAGOGASTRODUODENOSCOPY (EGD) WITH PROPOFOL N/A 02/05/2017  ? Procedure: ESOPHAGOGASTRODUODENOSCOPY (EGD) WITH PROPOFOL;  Surgeon: Lollie Sails, MD;  Location: [facility] ENDOSCOPY;  Service: Endoscopy;  Laterality: N/A;  ? PERIPHERAL VASCULAR CATHETERIZATION Bilateral 12/27/2015  ? Procedure: Lower Extremity Angiography;  Surgeon: Katha Cabal, MD;  Location: [place] CV LAB;  Service:  Cardiovascular;  Laterality: Bilateral;  ? PORTA CATH INSERTION N/A 09/08/2020  ? Procedure: PORTA CATH INSERTION;  Surgeon: Algernon Huxley, MD;  Location: [place] CV LAB;  Service: Cardiovascular;  Laterality: N/A;  ? TONSILLECTOMY    ? ? ?Prior to Admission medications   ?Medication Sig Start Date End Date Taking? Authorizing Provider  ?acetaminophen (TYLENOL) 325 MG tablet Take 650 mg by mouth every 6 (six) hours as needed for mild pain or moderate pain. ?Patient not taking: Reported on 04/18/2021    [provider]  ?ALPRAZolam Duanne Moron) 0.25 MG tablet Take 1 tablet (0.25 mg total) by mouth every 12 (twelve) hours as needed for anxiety. 02/13/21   Cammie Sickle, MD  ?apixaban (ELIQUIS) 5 MG TABS tablet Take 1 tablet (5 mg total) by mouth 2 (two) times daily. 06/06/21   Cammie Sickle, MD  ?B Complex-C-Folic Acid (B COMPLEX-VITAMIN C-FOLIC ACID) 1 MG tablet Take 1 tablet by mouth daily with breakfast. 08/07/20   Fritzi Mandes, MD  ?Cetirizine HCl (ZYRTEC ALLERGY PO) Take 1 tablet by mouth daily as needed (allergies). ?Patient not taking: Reported on 04/18/2021    [provider]  ?diphenoxylate-atropine (LOMOTIL) 2.5-0.025 MG tablet Take 1 tablet by mouth 4 (four) times daily as needed for diarrhea or loose stools. 10/10/20   Cammie Sickle, MD  ?Fluticasone-Salmeterol (ADVAIR) 100-50 MCG/DOSE AEPB Inhale 1 puff into the lungs 2 (two) times daily.    [provider]  ?HYDROcodone-acetaminophen (NORCO) 5-325 MG tablet Take 1 tablet by mouth every 12 (twelve) hours as needed for moderate pain. 05/17/21   Cammie Sickle, MD  ?  hydrOXYzine (ATARAX) 25 MG tablet TAKE 1 TABLET BY MOUTH 3 TIMES DAILY AS NEEDED FOR ITCHING. 05/11/21   Cammie Sickle, MD  ?ipratropium-albuterol (DUONEB) 0.5-2.5 (3) MG/3ML SOLN Take 3 mLs by nebulization every 4 (four) hours as needed. 11/29/20 02/13/21  Sidney Ace, MD  ?KLOR-CON M20 20 MEQ tablet TAKE 1 TABLET BY MOUTH TWICE A  DAY 12/07/20   Cammie Sickle, MD  ?levalbuterol [facility] HFA) 45 MCG/ACT inhaler Inhale 1-2 puffs into the lungs every 6 (six) hours as needed for wheezing or shortness of breath. 12/15/20   Borders, Kirt Boys, NP  ?lidocaine-prilocaine (EMLA) cream Apply 30 -45 mins prior to port access. 09/08/20   Cammie Sickle, MD  ?loratadine (CLARITIN) 10 MG tablet Take 10 mg by mouth daily.    [provider]  ?magic mouthwash w/lidocaine SOLN Take 10 mLs by mouth every 2 (two) hours as needed for mouth pain. ?Patient not taking: Reported on 04/18/2021 09/16/20   Borders, Kirt Boys, NP  ?megestrol (MEGACE ES) 625 MG/5ML suspension Take 5 mLs (625 mg total) by mouth daily. ?Patient not taking: Reported on 04/18/2021 01/16/21   Cammie Sickle, MD  ?montelukast (SINGULAIR) 10 MG tablet Take 1 tablet (10 mg total) by mouth at bedtime. 05/15/21   Cammie Sickle, MD  ?ondansetron (ZOFRAN) 8 MG tablet One pill every 8 hours as needed for nausea/vomitting. 10/17/20   Cammie Sickle, MD  ?pantoprazole (PROTONIX) 40 MG tablet TAKE 1 TABLET BY MOUTH EVERY DAY 02/27/21   Cammie Sickle, MD  ?prochlorperazine (COMPAZINE) 10 MG tablet Take 1 tablet (10 mg total) by mouth every 6 (six) hours as needed for nausea or vomiting. 10/17/20   Cammie Sickle, MD  ? ? ?Allergies as of 06/05/2021 - Review Complete 05/15/2021  ?Allergen Reaction Noted  ? Tramadol Other (See Comments) 04/10/2012  ? Penicillins Rash 04/10/2012  ? ? ?Family History  ?Problem Relation Age of Onset  ? Cerebral palsy Daughter   ? Colon polyps Sister   ? Leukemia Daughter   ? Depression Son   ? Breast cancer Neg Hx   ? ? ?Social History  ? ?Socioeconomic History  ? Marital status: Divorced  ?  Spouse name: Not on file  ? Number of children: Not on file  ? Years of education: Not on file  ? Highest education level: Not on file  ?Occupational History  ? Occupation: retired  ?Tobacco Use  ? Smoking status: Former  ?  Types:  Cigarettes  ?  Quit date: 03/02/2012  ?  Years since quitting: 9.2  ? Smokeless tobacco: Never  ?Vaping Use  ? Vaping Use: Never used  ?Substance and Sexual Activity  ? Alcohol use: No  ?  Alcohol/week: 0.0 standard drinks  ? Drug use: No  ? Sexual activity: Not on file  ?Other Topics Concern  ? Not on file  ?Social History Narrative  ? [place]; self; smoker- quit 2011/07/15; daughter died of leukemia-2020. 2 sons [[place]; [place]]; used to be caregiver/ retd.   ? ?Social Determinants of Health  ? ?Financial Resource Strain: Not on file  ?Food Insecurity: Not on file  ?Transportation Needs: Not on file  ?Physical Activity: Not on file  ?Stress: Not on file  ?Social Connections: Not on file  ?Intimate Partner Violence: Not on file  ? ? ?Review of Systems: ?See HPI, otherwise negative ROS ? ?Physical Exam: ?LMP 01/03/1995 (Approximate)  ?General:   Alert,  pleasant and cooperative in NAD ?Head:  Normocephalic and atraumatic. ?Neck:  Supple; no masses or thyromegaly. ?Lungs:  Clear throughout to auscultation.    ?Heart:  Regular rate and rhythm. ?Abdomen:  Soft, nontender and nondistended. Normal bowel sounds, without guarding, and without rebound.   ?Neurologic:  Alert and  oriented x4;  grossly normal neurologically. ? ?Impression/Plan: ?[name] is here for an ERCP to be performed for clogged CBD stent ? ?Risks, benefits, limitations, and alternatives regarding  ERCP have been reviewed with the patient.  Questions have been answered.  All parties agreeable. ? ? ?Lucilla Lame, MD  06/06/2021, 11:20 AM ?

## 2021-06-06 NOTE — Anesthesia Preprocedure Evaluation (Signed)
Anesthesia Evaluation  ?Patient identified by MRN, date of birth, ID band ?Patient awake ? ? ? ?Reviewed: ?Allergy & Precautions, NPO status , Patient's Chart, lab work & pertinent test results ? ?History of Anesthesia Complications ?Negative for: history of anesthetic complications ? ?Airway ?Mallampati: III ? ?TM Distance: <3 FB ?Neck ROM: limited ? ? ? Dental ? ?(+) Lower Dentures, Upper Dentures, Dental Advidsory Given ?  ?Pulmonary ?shortness of breath and with exertion, COPD, former smoker,  ?  ?Pulmonary exam normal ? ? ? ? ? ? ? Cardiovascular ?hypertension, (-) angina+ Peripheral Vascular Disease and + DOE  ?Normal cardiovascular exam ? ? ?  ?Neuro/Psych ?PSYCHIATRIC DISORDERS negative neurological ROS ?   ? GI/Hepatic ?Neg liver ROS, GERD  Medicated,  ?Endo/Other  ?negative endocrine ROS ? Renal/GU ?negative Renal ROS  ?negative genitourinary ?  ?Musculoskeletal ? ? Abdominal ?  ?Peds ? Hematology ?negative hematology ROS ?(+)   ?Anesthesia Other Findings ?Patient is NPO appropriate and reports no nausea or vomiting today. ? ?Past Medical History: ?No date: Abdominal pain, generalized ?No date: Anxiety ?No date: Atypical chest pain ?No date: Back pain ?2013: Basal cell carcinoma of skin ?    Comment:  resected from Left scalp area.  ?No date: COPD (chronic obstructive pulmonary disease) ([place]) ?No date: Depression ?No date: DOE (dyspnea on exertion) ?No date: GERD (gastroesophageal reflux disease) ?No date: Hypertension ?No date: Pancreatic cancer [facility]) ?No date: Shortness of breath dyspnea ? ?Past Surgical History: ?2013: BASAL CELL CARCINOMA EXCISION ?90s: CARPAL TUNNEL RELEASE; Left ?09/16/2015: CHOLECYSTECTOMY; N/A ?    Comment:  Procedure: LAPAROSCOPIC CHOLECYSTECTOMY WITH  ?             INTRAOPERATIVE CHOLANGIOGRAM;  Surgeon: Forest Gleason  ?             Bary Castilla, MD;  Location: [facility] ORS;  Service: General;   ?             Laterality: N/A; ?1990"s: COLONOSCOPY ?     Comment:  [place] ?08/05/2020: ERCP; N/A ?    Comment:  Procedure: ENDOSCOPIC RETROGRADE  ?             CHOLANGIOPANCREATOGRAPHY (ERCP);  Surgeon: Lucilla Lame,  ?             MD;  Location: [facility] ENDOSCOPY;  Service: Endoscopy;   ?             Laterality: N/A; ?02/05/2017: ESOPHAGOGASTRODUODENOSCOPY (EGD) WITH PROPOFOL; N/A ?    Comment:  Procedure: ESOPHAGOGASTRODUODENOSCOPY (EGD) WITH  ?             PROPOFOL;  Surgeon: Lollie Sails, MD;  Location:  ?             [place] ENDOSCOPY;  Service: Endoscopy;  Laterality: N/A; ?12/27/2015: PERIPHERAL VASCULAR CATHETERIZATION; Bilateral ?    Comment:  Procedure: Lower Extremity Angiography;  Surgeon:  ?             Katha Cabal, MD;  Location: [place] CV LAB;   ?             Service: Cardiovascular;  Laterality: Bilateral; ?09/08/2020: PORTA CATH INSERTION; N/A ?    Comment:  Procedure: PORTA CATH INSERTION;  Surgeon: Algernon Huxley, ?             MD;  Location: [place] [facility] LAB;  Service:  ?             Cardiovascular;  Laterality: N/A; ?No  date: TONSILLECTOMY ? ?BMI   ? Body Mass Index: 16.82 kg/m?  ?  ? ? Reproductive/Obstetrics ?negative OB ROS ? ?  ? ? ? ? ? ? ? ? ? ? ? ? ? ?  ?  ? ? ? ? ? ? ? ? ?Anesthesia Physical ? ?Anesthesia Plan ? ?ASA: 3 ? ?Anesthesia Plan: General  ? ?Post-op Pain Management:   ? ?Induction: Intravenous ? ?PONV Risk Score and Plan: Propofol infusion and TIVA ? ?Airway Management Planned: Natural Airway and Nasal Cannula ? ?Additional Equipment:  ? ?Intra-op Plan:  ? ?Post-operative Plan:  ? ?Informed Consent: I have reviewed the patients History and Physical, chart, labs and discussed the procedure including the risks, benefits and alternatives for the proposed anesthesia with the patient or authorized representative who has indicated his/her understanding and acceptance.  ? ? ? ?Dental Advisory Given ? ?Plan Discussed with: Anesthesiologist, CRNA and Surgeon ? ?Anesthesia Plan Comments: (Patient consented for risks of  anesthesia including but not limited to:  ?- adverse reactions to medications ?- risk of airway placement if required ?- damage to eyes, teeth, lips or other oral mucosa ?- nerve damage due to positioning  ?- sore throat or hoarseness ?- Damage to heart, brain, nerves, lungs, other parts of body or loss of life ? ?Patient voiced understanding.)  ? ? ? ? ? ? ?Anesthesia Quick Evaluation ? ?

## 2021-06-06 NOTE — Op Note (Signed)
[facility] ?Gastroenterology ?Patient Name: Shirley Barton ?Procedure Date: 06/06/2021 11:34 AM ?MRN: [id number] ?Account #: [id number] ?Date of Birth: 12/24/1950 ?Admit Type: Outpatient ?Age: 71 ?Room: [facility] ENDO ROOM 4 ?Gender: Female ?Note Status: Finalized ?Instrument Name: EXALT Ercp scope ?Procedure:             ERCP ?Indications:           Malignant stricture of the common bile duct ?Providers:             Lucilla Lame MD, MD ?Referring MD:          Charlaine Dalton (Referring MD) ?Medicines:             Propofol per Anesthesia ?Complications:         No immediate complications. ?Procedure:             Pre-Anesthesia Assessment: ?                       - Prior to the procedure, a History and Physical was  ?                       performed, and patient medications and allergies were  ?                       reviewed. The patient's tolerance of previous  ?                       anesthesia was also reviewed. The risks and benefits  ?                       of the procedure and the sedation options and risks  ?                       were discussed with the patient. All questions were  ?                       answered, and informed consent was obtained. Prior  ?                       Anticoagulants: The patient has taken Eliquis  ?                       (apixaban), last dose was 1 day prior to procedure.  ?                       ASA Grade Assessment: II - A patient with mild  ?                       systemic disease. After reviewing the risks and  ?                       benefits, the patient was deemed in satisfactory  ?                       condition to undergo the procedure. ?                       After obtaining informed consent, the scope was passed  ?  under direct vision. Throughout the procedure, the  ?                       patient's blood pressure, pulse, and oxygen  ?                       saturations were monitored continuously. The [place]  ?                        [employer] single use duodenoscope was  ?                       introduced through the mouth, and used to inject  ?                       contrast into and used to cannulate the bile duct. The  ?                       ERCP was accomplished without difficulty. The patient  ?                       tolerated the procedure well. ?Findings: ?     A biliary stent was visible on the scout film. One covered metal stent  ?     originating in the common hepatic duct was emerging from the major  ?     papilla. The bile duct was deeply cannulated with the short-nosed  ?     traction sphincterotome. Contrast was injected. I personally interpreted  ?     the bile duct images. There was brisk flow of contrast through the  ?     ducts. Image quality was excellent. Contrast extended to the entire  ?     biliary tree. A wire was passed into the biliary tree. One 10 Fr by 9 cm  ?     plastic stent with a single external flap and a single internal flap was  ?     placed 8 cm into the common bile duct. Bile flowed through the stent.  ?     The stent was in good position. ?Impression:            - One stent from the common hepatic duct was seen in  ?                       the major papilla. ?                       - One plastic stent was placed into the common bile  ?                       duct through the metal stent. ?Recommendation:        - Resume previous diet. ?                       - Discharge patient to home. ?                       - Watch for pancreatitis, bleeding, perforation, and  ?  cholangitis. ?                       - Repeat ERCP in 3 months to remove stent. ?Procedure Code(s):     --- Professional --- ?                       [phone], Endoscopic retrograde cholangiopancreatography  ?                       (ERCP); with removal and exchange of stent(s), biliary  ?                       or pancreatic duct, including pre- and post-dilation  ?                       and guide wire passage,  when performed, including  ?                       sphincterotomy, when performed, each stent exchanged ?                       [id number], Endoscopic catheterization of the biliary  ?                       ductal system, radiological supervision and  ?                       interpretation ?Diagnosis Code(s):     --- Professional --- ?                       K83.1, Obstruction of bile duct ?CPT copyright 2019 American Medical Association. All rights reserved. ?The codes documented in this report are preliminary and upon coder review may  ?be revised to meet current compliance requirements. ?Lucilla Lame MD, MD ?06/06/2021 12:15:15 PM ?This report has been signed electronically. ?Number of Addenda: 0 ?Note Initiated On: 06/06/2021 11:34 AM ?Estimated Blood Loss:  Estimated blood loss: none. ?     [facility] ?

## 2021-06-06 NOTE — Transfer of Care (Signed)
Immediate Anesthesia Transfer of Care Note ? ?Patient: Shirley Barton ? ?Procedure(s) Performed: ENDOSCOPIC RETROGRADE CHOLANGIOPANCREATOGRAPHY (ERCP) ? ?Patient Location: Endoscopy Unit ? ?Anesthesia Type:General ? ?Level of Consciousness: awake, drowsy and patient cooperative ? ?Airway & Oxygen Therapy: Patient Spontanous Breathing ? ?Post-op Assessment: Report given to RN and Post -op Vital signs reviewed and stable ? ?Post vital signs: Reviewed and stable ? ?Last Vitals:  ?Vitals Value Taken Time  ?BP 146/87 06/06/21 1218  ?Temp    ?Pulse    ?Resp 23 06/06/21 1218  ?SpO2 100 % 06/06/21 1218  ?Vitals shown include unvalidated device data. ? ?Last Pain:  ?Vitals:  ? 06/06/21 1218  ?TempSrc:   ?PainSc: 0-No pain  ?   ? ?  ? ?Complications: No notable events documented. ?

## 2021-06-06 NOTE — Anesthesia Postprocedure Evaluation (Signed)
Anesthesia Post Note ? ?Patient: Shirley Barton ? ?Procedure(s) Performed: ENDOSCOPIC RETROGRADE CHOLANGIOPANCREATOGRAPHY (ERCP) ? ?Patient location during evaluation: Endoscopy ?Anesthesia Type: General ?Level of consciousness: awake and alert ?Pain management: pain level controlled ?Vital Signs Assessment: post-procedure vital signs reviewed and stable ?Respiratory status: spontaneous breathing, nonlabored ventilation, respiratory function stable and patient connected to nasal cannula oxygen ?Cardiovascular status: blood pressure returned to baseline and stable ?Postop Assessment: no apparent nausea or vomiting ?Anesthetic complications: no ? ? ?No notable events documented. ? ? ?Last Vitals:  ?Vitals:  ? 06/06/21 1230 06/06/21 1240  ?BP: (!) 143/91 (!) 137/95  ?Pulse: 94 91  ?Resp: (!) 21 17  ?Temp:    ?SpO2: 98% 97%  ?  ?Last Pain:  ?Vitals:  ? 06/06/21 1240  ?TempSrc:   ?PainSc: 0-No pain  ? ? ?  ?  ?  ?  ?  ?  ? ?Martha Clan ? ? ? ? ?

## 2021-06-06 NOTE — Anesthesia Procedure Notes (Signed)
Procedure Name: General with mask airway ?Date/Time: 06/06/2021 11:52 AM ?Performed by: Kelton Pillar, CRNA ?Pre-anesthesia Checklist: Patient identified, Emergency Drugs available, Suction available and Patient being monitored ?Patient Re-evaluated:Patient Re-evaluated prior to induction ?Oxygen Delivery Method: Simple face mask ?Induction Type: IV induction ?Placement Confirmation: positive ETCO2, breath sounds checked- equal and bilateral and CO2 detector ?Dental Injury: Teeth and Oropharynx as per pre-operative assessment  ? ? ? ? ?

## 2021-06-06 NOTE — Brief Op Note (Signed)
10x9 biliary stent placed inside clogged metal stent in CBD by Dr. Allen Norris ?

## 2021-06-06 NOTE — Progress Notes (Signed)
Please inform patient as discussed yesterday recommend blood thinners ordered.  Watch out for any bleeding/avoid falls.  Please remind her of the below also.  ? ?Keep appointment with [place] as planned next week. ? ?Recommend follow-up with Dr.Wohls office re: plan for repeating stenting procedure.  Please give Dr.wohls office number to the patient. ? ? ?

## 2021-06-06 NOTE — Progress Notes (Signed)
Pt notified by message

## 2021-06-07 ENCOUNTER — Encounter: Payer: Self-pay | Admitting: Gastroenterology

## 2021-06-08 ENCOUNTER — Other Ambulatory Visit: Payer: Medicaid Other

## 2021-06-08 NOTE — Progress Notes (Signed)
Tumor Board Documentation ? ?Shirley Barton was presented by Dr Rogue Bussing at our Tumor Board on 06/08/2021, which included representatives from medical oncology, radiation oncology, surgical, radiology, pathology, navigation, internal medicine, research, palliative care, pulmonology, genetics. ? ?Shirley Barton currently presents as a current patient, for [place] with history of the following treatments: active survellience, neoadjuvant chemoradiation. ? ?Additionally, we reviewed previous medical and familial history, history of present illness, and recent lab results along with all available histopathologic and imaging studies. The tumor board considered available treatment options and made the following recommendations: ?Active surveillance ?There is no change from previous scan results ? ?The following procedures/referrals were also placed: No orders of the defined types were placed in this encounter. ? ? ?Clinical Trial Status: not discussed  ? 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 ? ?National site-specific guidelines NCCN were discussed with respect to the case. ? ?Tumor board is a meeting of clinicians from various specialty areas who evaluate and discuss patients for whom a multidisciplinary approach is being considered. Final determinations in the plan of care are those of the provider(s). The responsibility for follow up of recommendations given during tumor board is that of the provider.  ? ?Today?s extended care, comprehensive team conference, Any was not present for the discussion and was not examined.  ? ?Multidisciplinary Tumor Board is a multidisciplinary case peer review process.  Decisions discussed in the Multidisciplinary Tumor Board reflect the opinions of the specialists present at the conference without having examined the patient.  Ultimately, treatment and diagnostic decisions rest with the primary provider(s) and the patient. ? ?

## 2021-06-12 ENCOUNTER — Encounter: Payer: Self-pay | Admitting: Internal Medicine

## 2021-06-12 ENCOUNTER — Telehealth: Payer: Self-pay | Admitting: *Deleted

## 2021-06-12 ENCOUNTER — Inpatient Hospital Stay: Payer: Medicare HMO | Attending: Internal Medicine

## 2021-06-12 ENCOUNTER — Other Ambulatory Visit: Payer: Self-pay

## 2021-06-12 DIAGNOSIS — I10 Essential (primary) hypertension: Secondary | ICD-10-CM | POA: Insufficient documentation

## 2021-06-12 DIAGNOSIS — R000 Tachycardia, unspecified: Secondary | ICD-10-CM | POA: Diagnosis not present

## 2021-06-12 DIAGNOSIS — F419 Anxiety disorder, unspecified: Secondary | ICD-10-CM | POA: Diagnosis not present

## 2021-06-12 DIAGNOSIS — Z806 Family history of leukemia: Secondary | ICD-10-CM | POA: Diagnosis not present

## 2021-06-12 DIAGNOSIS — R109 Unspecified abdominal pain: Secondary | ICD-10-CM | POA: Insufficient documentation

## 2021-06-12 DIAGNOSIS — R197 Diarrhea, unspecified: Secondary | ICD-10-CM | POA: Insufficient documentation

## 2021-06-12 DIAGNOSIS — Z87891 Personal history of nicotine dependence: Secondary | ICD-10-CM | POA: Insufficient documentation

## 2021-06-12 DIAGNOSIS — R634 Abnormal weight loss: Secondary | ICD-10-CM | POA: Diagnosis not present

## 2021-06-12 DIAGNOSIS — J449 Chronic obstructive pulmonary disease, unspecified: Secondary | ICD-10-CM | POA: Insufficient documentation

## 2021-06-12 DIAGNOSIS — R7989 Other specified abnormal findings of blood chemistry: Secondary | ICD-10-CM | POA: Insufficient documentation

## 2021-06-12 DIAGNOSIS — Z95828 Presence of other vascular implants and grafts: Secondary | ICD-10-CM

## 2021-06-12 DIAGNOSIS — C250 Malignant neoplasm of head of pancreas: Secondary | ICD-10-CM | POA: Insufficient documentation

## 2021-06-12 LAB — CBC WITH DIFFERENTIAL/PLATELET
Abs Immature Granulocytes: 0.04 10*3/uL (ref 0.00–0.07)
Basophils Absolute: 0 10*3/uL (ref 0.0–0.1)
Basophils Relative: 0 %
Eosinophils Absolute: 0.1 10*3/uL (ref 0.0–0.5)
Eosinophils Relative: 1 %
HCT: 36.4 % (ref 36.0–46.0)
Hemoglobin: 11.7 g/dL — ABNORMAL LOW (ref 12.0–15.0)
Immature Granulocytes: 0 %
Lymphocytes Relative: 9 %
Lymphs Abs: 0.9 10*3/uL (ref 0.7–4.0)
MCH: 27.8 pg (ref 26.0–34.0)
MCHC: 32.1 g/dL (ref 30.0–36.0)
MCV: 86.5 fL (ref 80.0–100.0)
Monocytes Absolute: 0.7 10*3/uL (ref 0.1–1.0)
Monocytes Relative: 7 %
Neutro Abs: 8.4 10*3/uL — ABNORMAL HIGH (ref 1.7–7.7)
Neutrophils Relative %: 83 %
Platelets: 244 10*3/uL (ref 150–400)
RBC: 4.21 MIL/uL (ref 3.87–5.11)
RDW: 13.9 % (ref 11.5–15.5)
WBC: 10.1 10*3/uL (ref 4.0–10.5)
nRBC: 0 % (ref 0.0–0.2)

## 2021-06-12 LAB — COMPREHENSIVE METABOLIC PANEL
ALT: 44 U/L (ref 0–44)
AST: 50 U/L — ABNORMAL HIGH (ref 15–41)
Albumin: 3.3 g/dL — ABNORMAL LOW (ref 3.5–5.0)
Alkaline Phosphatase: 415 U/L — ABNORMAL HIGH (ref 38–126)
Anion gap: 10 (ref 5–15)
BUN: 10 mg/dL (ref 8–23)
CO2: 27 mmol/L (ref 22–32)
Calcium: 9 mg/dL (ref 8.9–10.3)
Chloride: 93 mmol/L — ABNORMAL LOW (ref 98–111)
Creatinine, Ser: 0.67 mg/dL (ref 0.44–1.00)
GFR, Estimated: >60 mL/min (ref >60)
Glucose, Bld: 138 mg/dL — ABNORMAL HIGH (ref 70–99)
Potassium: 3.7 mmol/L (ref 3.5–5.1)
Sodium: 130 mmol/L — ABNORMAL LOW (ref 135–145)
Total Bilirubin: 1.3 mg/dL — ABNORMAL HIGH (ref 0.3–1.2)
Total Protein: 8.1 g/dL (ref 6.5–8.1)

## 2021-06-12 MED ORDER — SODIUM CHLORIDE 0.9% FLUSH
10.0000 mL | INTRAVENOUS | Status: AC | PRN
  Filled 2021-06-12: qty 10

## 2021-06-12 MED ORDER — HEPARIN SOD (PORK) LOCK FLUSH 100 UNIT/ML IV SOLN
500.0000 [IU] | Freq: Once | INTRAVENOUS | Status: AC
  Filled 2021-06-12: qty 5

## 2021-06-12 NOTE — Telephone Encounter (Signed)
Patient needs to reschedule appointment. ?

## 2021-06-13 ENCOUNTER — Encounter: Payer: Self-pay | Admitting: Nurse Practitioner

## 2021-06-13 ENCOUNTER — Inpatient Hospital Stay (HOSPITAL_BASED_OUTPATIENT_CLINIC_OR_DEPARTMENT_OTHER): Payer: Medicare HMO | Admitting: Internal Medicine

## 2021-06-13 ENCOUNTER — Other Ambulatory Visit: Payer: Self-pay | Admitting: Internal Medicine

## 2021-06-13 ENCOUNTER — Inpatient Hospital Stay (HOSPITAL_BASED_OUTPATIENT_CLINIC_OR_DEPARTMENT_OTHER): Payer: Medicare HMO | Admitting: Nurse Practitioner

## 2021-06-13 VITALS — BP 116/78 | HR 102 | Temp 98.7°F | Resp 20 | Wt 96.8 lb

## 2021-06-13 DIAGNOSIS — C250 Malignant neoplasm of head of pancreas: Secondary | ICD-10-CM

## 2021-06-13 LAB — CANCER ANTIGEN 19-9: CA 19-9: 2077 U/mL — ABNORMAL HIGH (ref 0–35)

## 2021-06-13 MED ORDER — MEGESTROL ACETATE 625 MG/5ML PO SUSP: 625.0000 mg | Freq: Every day | ORAL | 0 refills | Status: AC

## 2021-06-13 NOTE — Progress Notes (Signed)
Patient son states she is not eating and she is extremely tired. Patient states she feels sick on stomach a lot.  ?

## 2021-06-13 NOTE — Progress Notes (Signed)
Patient will see Dr. Rogue Bussing today. Appt cancelled with me.  ? ?

## 2021-06-13 NOTE — Progress Notes (Signed)
[facility] ?CONSULT NOTE ? ?Patient Care Team: ?Erma Heritage, MD as PCP - General (Family Medicine) ?Rico Junker, RN as Registered Nurse ?Theodore Demark, RN as Registered Nurse ?Adron Bene as Physician Assistant (Gastroenterology) ?Robert Bellow, MD (General Surgery) ?Clent Jacks, RN as Oncology Nurse Navigator ?Cammie Sickle, MD as Consulting Physician (Hematology and Oncology) ? ?CHIEF COMPLAINTS/PURPOSE OF CONSULTATION: Pancreatic cancer ? ?#  ?Oncology History Overview Note  ?# MAY 2022-pancreatic adenocarcinoma [obstructive jaundice-]MRI-18 mm pancreatic head mass; EUS [[facility]; Dr.Spaete]-abutment of portal vein/SMV; no lymphadenopathy noted; May 5 CA 19-9-163; s/p stenting -May 25th-47.  ? ?#Obstructive jaundice-s/p ERCP stenting [Dr.Wohl] ? ?MAY 2022- ENDOSONOGRAPHIC FINDING: : ?A round mass was identified in the pancreatic head.  ?The mass was hypoechoic. The mass measured 16 mm by 17  ?mm in maximal cross-sectional diameter. The outer  ?margins were irregular. There was sonographic evidence  ?suggesting invasion into the portal vein (manifested  ?by abutment) and the superior mesenteric vein  ?(manifested by abutment).  ? ?#Pancreatic adenocarcinoma pT1 however on EUS/CT [[facility]. Dr.Shah]-concerning for tumor abutting the celiac axis /common hepatic artery /portal vein/superior mesenteric vein; without any metastatic disease.  Borderline resectable Elevated CA 19-9.  [S/p evaluation at [facility] ? ?# June 9th- FOLFIRINOX cycle #1; discontinued because of poor GI tolerance ? ?# &/01-2021-gemcitabine Abraxane ['100mg'$ /2] day 1 day 815 ? ?# July 11th-gemcitabine and Abraxane day.  November 15, 2020-CT scan progressive disease/poor tolerance to therapy ? ?# AUG 29th, 2022-gemcitabine single agent weekly-concurrent radiation;  ?December 12, 2020-discontinued gemcitabine single agent because of significant respiratory distress post gemcitabine infusion.   Continue local radiation [finished OCT 9211] ?---------------------------------------------------------  ? ? ?  ?Malignant neoplasm of head of pancreas ([place])  ?08/24/2020 Initial Diagnosis  ? Malignant neoplasm of head of pancreas [facility]) ?  ?09/09/2020 - 09/12/2020 Chemotherapy  ?  ? ?  ? ?  ?10/10/2020 -  Chemotherapy  ?  Patient is on Treatment Plan: PANCREATIC ABRAXANE / GEMCITABINE D1,8,15 Q28D ? ?  ? ?  ? ? ? ?HISTORY OF PRESENTING ILLNESS: Caucasian female patient ambulating independently.  Accompanied by her son. ? ?Shirley Barton 71 y.o.  female with a history of COPD; chronic abdominal pain-pancreatic cancer with poor tolerance to  chemotherapy-lmetastatic disease-currently on radiation to the pancreas on a palliative basis is here for follow-up/review results of the CT scan/MRCP ? ?S/p stent exchange-March 2023-patient noted to have improvement of the abdominal pain.  However is not completely resolved. Chronic nausea; vomiting. On zofran BID. Poor appetite.  ? ?Patient finished radiation approximately 3 months ago.  Has intermittent abdominal pain which is currently resolved.  She is needing to take narcotic pain medication only as needed.   ? ?Review of Systems  ?Constitutional:  Positive for malaise/fatigue and weight loss. Negative for chills, diaphoresis and fever.  ?HENT:  Negative for nosebleeds and sore throat.   ?Eyes:  Negative for double vision.  ?Respiratory:  Positive for shortness of breath. Negative for cough, hemoptysis, sputum production and wheezing.   ?Cardiovascular:  Negative for chest pain, palpitations, orthopnea and leg swelling.  ?Gastrointestinal:  Positive for abdominal pain and diarrhea. Negative for blood in stool, constipation, heartburn, melena and vomiting.  ?Genitourinary:  Negative for dysuria, frequency and urgency.  ?Musculoskeletal:  Negative for back pain and joint pain.  ?Skin: Negative.  Negative for itching and rash.  ?Neurological:  Negative for dizziness, tingling,  focal weakness, weakness and headaches.  ?Endo/Heme/Allergies:  Does not bruise/bleed easily.  ?Psychiatric/Behavioral:  Negative for depression. The patient is not nervous/anxious and does not have insomnia.    ? ?MEDICAL HISTORY:  ?Past Medical History:  ?Diagnosis Date  ? Abdominal pain, generalized   ? Anxiety   ? Atypical chest pain   ? Back pain   ? Basal cell carcinoma of skin 06-16-2011  ? resected from Left scalp area.   ? COPD (chronic obstructive pulmonary disease) ([place])   ? Depression   ? DOE (dyspnea on exertion)   ? GERD (gastroesophageal reflux disease)   ? Hypertension   ? Pancreatic cancer ([place])   ? Shortness of breath dyspnea   ? ? ?SURGICAL HISTORY: ?Past Surgical History:  ?Procedure Laterality Date  ? BASAL CELL CARCINOMA EXCISION  Jun 16, 2011  ? CARPAL TUNNEL RELEASE Left 90s  ? CHOLECYSTECTOMY N/A 09/16/2015  ? Procedure: LAPAROSCOPIC CHOLECYSTECTOMY WITH INTRAOPERATIVE CHOLANGIOGRAM;  Surgeon: Robert Bellow, MD;  Location: [facility] ORS;  Service: General;  Laterality: N/A;  ? COLONOSCOPY  1990"s  ? [place]  ? ENDOSCOPIC RETROGRADE CHOLANGIOPANCREATOGRAPHY (ERCP) WITH PROPOFOL N/A 11/10/2020  ? Procedure: ENDOSCOPIC RETROGRADE CHOLANGIOPANCREATOGRAPHY (ERCP) WITH PROPOFOL;  Surgeon: Lucilla Lame, MD;  Location: [facility] ENDOSCOPY;  Service: Endoscopy;  Laterality: N/A;  ? ERCP N/A 08/05/2020  ? Procedure: ENDOSCOPIC RETROGRADE CHOLANGIOPANCREATOGRAPHY (ERCP);  Surgeon: Lucilla Lame, MD;  Location: [facility] ENDOSCOPY;  Service: Endoscopy;  Laterality: N/A;  ? ERCP N/A 06/06/2021  ? Procedure: ENDOSCOPIC RETROGRADE CHOLANGIOPANCREATOGRAPHY (ERCP);  Surgeon: Lucilla Lame, MD;  Location: [facility] ENDOSCOPY;  Service: Endoscopy;  Laterality: N/A;  ? ESOPHAGOGASTRODUODENOSCOPY (EGD) WITH PROPOFOL N/A 02/05/2017  ? Procedure: ESOPHAGOGASTRODUODENOSCOPY (EGD) WITH PROPOFOL;  Surgeon: Lollie Sails, MD;  Location: [facility] ENDOSCOPY;  Service: Endoscopy;  Laterality: N/A;  ? PERIPHERAL VASCULAR CATHETERIZATION Bilateral  12/27/2015  ? Procedure: Lower Extremity Angiography;  Surgeon: Katha Cabal, MD;  Location: [place] CV LAB;  Service: Cardiovascular;  Laterality: Bilateral;  ? PORTA CATH INSERTION N/A 09/08/2020  ? Procedure: PORTA CATH INSERTION;  Surgeon: Algernon Huxley, MD;  Location: [place] CV LAB;  Service: Cardiovascular;  Laterality: N/A;  ? TONSILLECTOMY    ? ? ?SOCIAL HISTORY: ?Social History  ? ?Socioeconomic History  ? Marital status: Divorced  ?  Spouse name: Not on file  ? Number of children: Not on file  ? Years of education: Not on file  ? Highest education level: Not on file  ?Occupational History  ? Occupation: retired  ?Tobacco Use  ? Smoking status: Former  ?  Types: Cigarettes  ?  Quit date: 03/02/2012  ?  Years since quitting: 9.2  ? Smokeless tobacco: Never  ?Vaping Use  ? Vaping Use: Never used  ?Substance and Sexual Activity  ? Alcohol use: No  ?  Alcohol/week: 0.0 standard drinks  ? Drug use: No  ? Sexual activity: Not on file  ?Other Topics Concern  ? Not on file  ?Social History Narrative  ? [place]; self; smoker- quit 2011/06/16; daughter died of leukemia-2020. 2 sons [[place]; [place]]; used to be caregiver/ retd.   ? ?Social Determinants of Health  ? ?Financial Resource Strain: Not on file  ?Food Insecurity: Not on file  ?Transportation Needs: Not on file  ?Physical Activity: Not on file  ?Stress: Not on file  ?Social Connections: Not on file  ?Intimate Partner Violence: Not on file  ? ? ?FAMILY HISTORY: ?Family History  ?Problem Relation Age of Onset  ? Cerebral palsy Daughter   ? Colon polyps Sister   ?  Leukemia Daughter   ? Depression Son   ? Breast cancer Neg Hx   ? ? ?ALLERGIES:  is allergic to tramadol and penicillins. ? ?MEDICATIONS:  ?Current Outpatient Medications  ?Medication Sig Dispense Refill  ? acetaminophen (TYLENOL) 325 MG tablet Take 650 mg by mouth every 6 (six) hours as needed for mild pain or moderate pain. (Patient not taking: Reported on 04/18/2021)    ? ALPRAZolam  (XANAX) 0.25 MG tablet Take 1 tablet (0.25 mg total) by mouth every 12 (twelve) hours as needed for anxiety. (Patient not taking: Reported on 06/13/2021) 45 tablet 0  ? apixaban (ELIQUIS) 5 MG TABS tablet Take 1

## 2021-06-13 NOTE — Assessment & Plan Note (Addendum)
#  Pancreatic adenocarcinoma/locally advanced/metastatic disease [question liver lesions; abdominal/retroperitoneal adenopathy-CT scan November 15, 2020]. Currently s/p palliative radiation to the pancreas;  currently s/p  Radiation [10/11]. CT scan Feb 11th, 2023- Interval increased low-density within the pancreatic head surrounding the biliary stent with soft tissue stranding in the adjacent retroperitoneal fat and mild duodenal wall thickening, attributed to radiation therapy. No measurable residual pancreatic head mass or definite metastases identified. MRI/MRCP- MARCH 2023-Mild intrahepatic biliary duct dilatation with abrupt tapering of the common duct at or just proximal to the proximal end of the biliary stent". ? ?#Discussed with patient the concerns for progressive disease based on imaging [?  Tumor obstructing the stent]; mildly elevated tumor marker-although it is not definitive for progressive disease.  Could consider 5-FU-nab-irinotecan as an next line of therapy given patient's poor tolerance to other therapies including FOLFIRINOX/gemcitabine Abraxane.  Await cancer marker.  If elevated could consider resume chemotherapy; otherwise continue surveillance.  Discussed with Dr. Allen Norris.  Also reviewed at the tumor conference. ? ?#Given the slightly elevated LFTs-alk phos 460 [[facility]]; AST ALT mild elevation-patient underwent stent exchange.  March 2023 AST ALT slightly improved.  Monitor for now. ? ?#Abdominal discomfort-question related to biliary obstruction status post stent exchange; improved monitor for now closely. ?? ?# weight loss: Recommend continue  Megace- stable; refilled  ? ?# COPD- on Advair/  xopenex prn- STABLE ? ?# Chronic Diarrhea/adominal pain - on nocro qhs prn-STABLE.  ? ?# Anxiety: continue xanax - 1 q 12 hours prn.  ?? ?* call son- [phone] ?# DISPOSITION: ?# Follow up to TBD-- Dr.B ? ?# 40 minutes face-to-face with the patient discussing the above plan of care; more than 50% of time  spent on prognosis/ natural history; counseling and coordination. ? ? ? ? ? ? ?

## 2021-06-14 ENCOUNTER — Encounter: Payer: Self-pay | Admitting: Internal Medicine

## 2021-06-14 ENCOUNTER — Inpatient Hospital Stay: Payer: Medicare HMO | Admitting: Internal Medicine

## 2021-06-14 ENCOUNTER — Other Ambulatory Visit: Payer: Self-pay | Admitting: Internal Medicine

## 2021-06-14 NOTE — Progress Notes (Signed)
DISCONTINUE ON PATHWAY REGIMEN - Pancreatic Adenocarcinoma ? ? ?  A cycle is every 28 days: ?    Nab-paclitaxel (protein bound)  ?    Gemcitabine  ? ?**Always confirm dose/schedule in your pharmacy ordering system** ? ?REASON: Disease Progression ?PRIOR TREATMENT: PANOS106: Nab-Paclitaxel (Abraxane) 125 mg/m2 D1, 8, 15 + Gemcitabine 1,000 mg/m2 D1, 8, 15 q28 Days ?TREATMENT RESPONSE: Progressive Disease (PD) ? ?START ON PATHWAY REGIMEN - Pancreatic Adenocarcinoma ? ? ?  A cycle is every 14 days: ?    Liposomal irinotecan  ?    Leucovorin  ?    Fluorouracil  ? ?**Always confirm dose/schedule in your pharmacy ordering system** ? ?Patient Characteristics: ?Metastatic Disease, Second Line, MSS/pMMR or MSI Unknown, Gemcitabine-Based Therapy First Line ?Therapeutic Status: Metastatic Disease ?Line of Therapy: Second Line ?Microsatellite/Mismatch Repair Status: Unknown ?Intent of Therapy: ?Non-Curative / Palliative Intent, Discussed with Patient ?

## 2021-06-14 NOTE — Progress Notes (Signed)
I had a long discussion with the patient's regarding the extremity elevated cancer marker 2000+; in the context of biliary obstruction [likely secondary to due to tumor growth as per Dr.Wohl]-currently s/p stent exchange. ?Again reviewed the inadequacy of imaging to document progression.  Also discussed at tumor conference. ? ?Recommend starting patient on 5FU-Onivyde.  Previous poor tolerance to FOLFIRINOX; gemcitabine Abraxane; single agent gemcitabine etc. ? ?Schedule follow-up- X-MD [27th or 28th]-Labs CBC CMP-CA 19 9; 5FU-pump;onivyde- pump off D-3.  ? ?Patient's son aware of the plan; he is in agreement.  He will inform his mother.  ? ? ?

## 2021-06-15 ENCOUNTER — Inpatient Hospital Stay: Payer: Medicare HMO

## 2021-06-15 NOTE — Progress Notes (Signed)
Nutrition Follow-up: ? ? ?Patient with pancreatic cancer.  Planning to restart chemotherapy 5FU-onivyde.   ? ?Spoke with patient via phone.  Patient reports that her appetite has been poor ever since had to drink contrast for scan.  Did have stent replaced on 3/7.  Feels like she can eat a little bit more following that procedure.  Yesterday able to eat 1/2 soup with crackers and chicken cordon blue meal with sweet potato.  Has started back taking megace.  Drinking water, gingerale, juice and V-8 juice.   ? ? ? ?Medications: megace ? ?Labs: reviewed ? ?Anthropometrics:  ? ?Weight 96 lb 12.8 oz on 3/14 ? ?100 lb 9.6 oz on 2/13 ?100 lb 6.4 oz on 11/18 ?97 lb 3.2 on 10/17 ?92 lb on 8/15 ?98 lb on 7/11 ?100 lb on 6/20 ? ? ?NUTRITION DIAGNOSIS: Inadequate oral intake ongoing ? ? ? ?INTERVENTION:  ?Agree with restarting appetite stimulant ?Encouraged patient to snack frequently during the day.   ? ? ?MONITORING, EVALUATION, GOAL: weight trends, intake ? ? ?NEXT VISIT: Friday, April 7 phone call ? ?Brodi Kari B. Zenia Resides, RD, LDN ?Registered Dietitian ?[phone] (mobile) ? ? ?

## 2021-06-15 NOTE — Progress Notes (Signed)
Labs already entered, appt has been made with Dr. Janese Banks 06/26/21. ?

## 2021-06-19 ENCOUNTER — Ambulatory Visit
Admission: RE | Admit: 2021-06-19 | Discharge: 2021-06-19 | Disposition: A | Discharge status: Home / Self Care | Payer: Medicare HMO | Source: Ambulatory Visit | Attending: Radiation Oncology | Admitting: Radiation Oncology

## 2021-06-19 ENCOUNTER — Other Ambulatory Visit: Payer: Self-pay

## 2021-06-19 VITALS — BP 142/94 | HR 127 | Temp 96.0°F | Resp 22 | Ht 63.5 in | Wt 95.2 lb

## 2021-06-19 DIAGNOSIS — C259 Malignant neoplasm of pancreas, unspecified: Secondary | ICD-10-CM

## 2021-06-19 NOTE — Progress Notes (Signed)
Radiation Oncology ?Follow up Note ? ?Name: Shirley Barton   ?Date:   06/19/2021 ?MRN:  [id number] ?DOB: 1951/03/14  ? ? ?This 71 y.o. female presents to the clinic today for 74-monthfollow-up status post IMRT radiation therapy to her pancreas for stage I adenocarcinoma. ? ?REFERRING PROVIDER: RErma Heritage MD ? 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  She is seen today in routine follow-up she continues to have abdominal pain and discomfort..  Her tumor markers have elevated with CA 19-9 up to 2000+.  She was seen by Dr. WAllen Norrisstatus post stent exchange with high probability of tumor regrowth.  She is to restart 5-FU-Onivyde.  She does not seem to be jaundiced at this time.  She recently had an MRI scan showing mild intrahepatic biliary duct dilatation with abrupt tapering of her common duct just proximal to the proximal end of the biliary stent.  This is progressive since August.  There is also ill-defined soft tissue fullness in the pancreatic head no discrete mass lesion evident.  She also has evidence of portal vein thrombosis. ? ?COMPLICATIONS OF TREATMENT: none ? ?FOLLOW UP COMPLIANCE: keeps appointments  ? ?PHYSICAL EXAM:  ?BP (!) 142/94   Pulse (!) 127   Temp (!) 96 ?F (35.6 ?C)   Resp (!) 22   Ht 5' 3.5" (1.613 m)   Wt 95 lb 3.2 oz (43.2 kg)   LMP 01/03/1995 (Approximate)   BMI 16.60 kg/m?  ?Frail-appearing female in NAD.  Well-developed well-nourished patient in NAD. HEENT reveals PERLA, EOMI, discs not visualized.  Oral cavity is clear. No oral mucosal lesions are identified. Neck is clear without evidence of cervical or supraclavicular adenopathy. Lungs are clear to A&P. Cardiac examination is essentially unremarkable with regular rate and rhythm without murmur rub or thrill. Abdomen is benign with no organomegaly or masses noted. Motor sensory and DTR levels are equal and symmetric in the  upper and lower extremities. Cranial nerves II through XII are grossly intact. Proprioception is intact. No peripheral adenopathy or edema is identified. No motor or sensory levels are noted. Crude visual fields are within normal range. ? ?RADIOLOGY RESULTS: MRI and CT scans of her abdomen reviewed compatible with above-stated findings ? ?PLAN: Present time she is being treated for her markedly elevated CA 19-9 as well as probable recurrence in the area of her stent.  I might advise in the future a PET CT scan to better delineate amount of recurrent disease in this area as sometimes we will offer salvage radiation therapy to the hypermetabolic area.  Patient will proceed with chemotherapy at this time.  I have asked to see her back in 6 months for follow-up.  Patient knows to call with any concerns. ? ?I would like to take this opportunity to thank you for allowing me to participate in the care of your patient.. ?  ? GNoreene Filbert MD ? ?

## 2021-06-23 MED FILL — Dexamethasone Sodium Phosphate Inj 100 MG/10ML: INTRAMUSCULAR | Qty: 1 | Status: AC

## 2021-06-23 NOTE — Progress Notes (Signed)
Pharmacist Chemotherapy Monitoring - Initial Assessment   ? ?Anticipated start date: 06/26/21  ? ?The following has been reviewed per standard work regarding the patient's treatment regimen: ?The patient's diagnosis, treatment plan and drug doses, and organ/hematologic function ?Lab orders and baseline tests specific to treatment regimen  ?The treatment plan start date, drug sequencing, and pre-medications ?Prior authorization status  ?Patient's documented medication list, including drug-drug interaction screen and prescriptions for anti-emetics and supportive care specific to the treatment regimen ?The drug concentrations, fluid compatibility, administration routes, and timing of the medications to be used ?The patient's access for treatment and lifetime cumulative dose history, if applicable  ?The patient's medication allergies and previous infusion related reactions, if applicable  ? ?Changes made to treatment plan:  ?N/A ? ?Follow up needed:  ?N/A ? ? ?Adelina Mings, [facility], ?06/23/2021  9:35 AM  ?

## 2021-06-26 ENCOUNTER — Encounter: Payer: Self-pay | Admitting: Oncology

## 2021-06-26 ENCOUNTER — Inpatient Hospital Stay: Payer: Medicare HMO

## 2021-06-26 ENCOUNTER — Inpatient Hospital Stay (HOSPITAL_BASED_OUTPATIENT_CLINIC_OR_DEPARTMENT_OTHER): Payer: Medicare HMO | Admitting: Oncology

## 2021-06-26 ENCOUNTER — Other Ambulatory Visit: Payer: Self-pay

## 2021-06-26 VITALS — BP 94/68 | HR 133 | Temp 98.7°F | Resp 20 | Wt 92.8 lb

## 2021-06-26 VITALS — BP 92/60 | HR 130 | Temp 97.7°F

## 2021-06-26 DIAGNOSIS — C250 Malignant neoplasm of head of pancreas: Secondary | ICD-10-CM

## 2021-06-26 DIAGNOSIS — R000 Tachycardia, unspecified: Secondary | ICD-10-CM

## 2021-06-26 LAB — IRON AND TIBC
Iron: 16 ug/dL — ABNORMAL LOW (ref 28–170)
Saturation Ratios: 9 % — ABNORMAL LOW (ref 10.4–31.8)
TIBC: 181 ug/dL — ABNORMAL LOW (ref 250–450)
UIBC: 165 ug/dL

## 2021-06-26 LAB — COMPREHENSIVE METABOLIC PANEL
ALT: 20 U/L (ref 0–44)
AST: 28 U/L (ref 15–41)
Albumin: 2.4 g/dL — ABNORMAL LOW (ref 3.5–5.0)
Alkaline Phosphatase: 598 U/L — ABNORMAL HIGH (ref 38–126)
Anion gap: 10 (ref 5–15)
BUN: 19 mg/dL (ref 8–23)
CO2: 22 mmol/L (ref 22–32)
Calcium: 8.6 mg/dL — ABNORMAL LOW (ref 8.9–10.3)
Chloride: 102 mmol/L (ref 98–111)
Creatinine, Ser: 0.69 mg/dL (ref 0.44–1.00)
GFR, Estimated: >60 mL/min (ref >60)
Glucose, Bld: 154 mg/dL — ABNORMAL HIGH (ref 70–99)
Potassium: 3.6 mmol/L (ref 3.5–5.1)
Sodium: 134 mmol/L — ABNORMAL LOW (ref 135–145)
Total Bilirubin: 1 mg/dL (ref 0.3–1.2)
Total Protein: 7.4 g/dL (ref 6.5–8.1)

## 2021-06-26 LAB — FERRITIN: Ferritin: 128 ng/mL (ref 11–307)

## 2021-06-26 LAB — CBC WITH DIFFERENTIAL/PLATELET
Abs Immature Granulocytes: 0.25 10*3/uL — ABNORMAL HIGH (ref 0.00–0.07)
Basophils Absolute: 0.1 10*3/uL (ref 0.0–0.1)
Basophils Relative: 0 %
Eosinophils Absolute: 0.1 10*3/uL (ref 0.0–0.5)
Eosinophils Relative: 0 %
HCT: 22.4 % — ABNORMAL LOW (ref 36.0–46.0)
Hemoglobin: 7.4 g/dL — ABNORMAL LOW (ref 12.0–15.0)
Immature Granulocytes: 1 %
Lymphocytes Relative: 5 %
Lymphs Abs: 1.1 10*3/uL (ref 0.7–4.0)
MCH: 26.4 pg (ref 26.0–34.0)
MCHC: 33 g/dL (ref 30.0–36.0)
MCV: 80 fL (ref 80.0–100.0)
Monocytes Absolute: 1.4 10*3/uL — ABNORMAL HIGH (ref 0.1–1.0)
Monocytes Relative: 7 %
Neutro Abs: 17.9 10*3/uL — ABNORMAL HIGH (ref 1.7–7.7)
Neutrophils Relative %: 87 %
Platelets: 374 10*3/uL (ref 150–400)
RBC: 2.8 MIL/uL — ABNORMAL LOW (ref 3.87–5.11)
RDW: 15.3 % (ref 11.5–15.5)
WBC: 20.7 10*3/uL — ABNORMAL HIGH (ref 4.0–10.5)
nRBC: 0 % (ref 0.0–0.2)

## 2021-06-26 MED ORDER — HEPARIN SOD (PORK) LOCK FLUSH 100 UNIT/ML IV SOLN
INTRAVENOUS | Status: AC
  Administered 2021-06-26: 500 [IU] via INTRAVENOUS
  Filled 2021-06-26: qty 5

## 2021-06-26 MED ORDER — HEPARIN SOD (PORK) LOCK FLUSH 100 UNIT/ML IV SOLN
500.0000 [IU] | Freq: Once | INTRAVENOUS | Status: AC
  Filled 2021-06-26: qty 5

## 2021-06-26 MED ORDER — SODIUM CHLORIDE 0.9 % IV SOLN
Freq: Once | INTRAVENOUS | Status: AC
  Filled 2021-06-26: qty 250

## 2021-06-26 MED ORDER — SODIUM CHLORIDE 0.9% FLUSH
10.0000 mL | Freq: Once | INTRAVENOUS | Status: AC
  Administered 2021-06-26: 10 mL via INTRAVENOUS
  Filled 2021-06-26: qty 10

## 2021-06-26 MED ORDER — LEVOFLOXACIN 500 MG PO TABS: 500.0000 mg | ORAL_TABLET | Freq: Every day | ORAL | 0 refills | Status: DC

## 2021-06-26 NOTE — Progress Notes (Signed)
? ? ? ?Hematology/Oncology Consult note ?[place]  ?Telephone:(336) [phone] [phone] ? ?Patient Care Team: ?Erma Heritage, MD as PCP - General (Family Medicine) ?Rico Junker, RN as Registered Nurse ?Theodore Demark, RN as Registered Nurse ?Adron Bene as Physician Assistant (Gastroenterology) ?Robert Bellow, MD (General Surgery) ?Clent Jacks, RN as Oncology Nurse Navigator ?Cammie Sickle, MD as Consulting Physician (Hematology and Oncology)  ? ?Name of the patient: Shirley Barton  ?[id number]  ?1951-03-28  ? ?Date of visit: 06/26/21 ? ?Diagnosis-metastatic pancreatic cancer ? ?Chief complaint/ Reason for visit-on treatment assessment prior to cycle 1 of 5-FU nab irinotecan chemotherapy ? ?Heme/Onc history:  ?Oncology History Overview Note  ?# MAY 2022-pancreatic adenocarcinoma [obstructive jaundice-]MRI-18 mm pancreatic head mass; EUS [[facility]; Dr.Spaete]-abutment of portal vein/SMV; no lymphadenopathy noted; May 5 CA 19-9-163; s/p stenting -May 25th-47.  ? ?#Obstructive jaundice-s/p ERCP stenting [Dr.Wohl] ? ?MAY 2022- ENDOSONOGRAPHIC FINDING: : ?A round mass was identified in the pancreatic head.  ?The mass was hypoechoic. The mass measured 16 mm by 17  ?mm in maximal cross-sectional diameter. The outer  ?margins were irregular. There was sonographic evidence  ?suggesting invasion into the portal vein (manifested  ?by abutment) and the superior mesenteric vein  ?(manifested by abutment).  ? ?#Pancreatic adenocarcinoma pT1 however on EUS/CT [[facility]. Dr.Shah]-concerning for tumor abutting the celiac axis /common hepatic artery /portal vein/superior mesenteric vein; without any metastatic disease.  Borderline resectable Elevated CA 19-9.  [S/p evaluation at [place] ? ?# June 9th- FOLFIRINOX cycle #1; discontinued because of poor GI tolerance ? ?# &/01-2021-gemcitabine Abraxane ['100mg'$ /2] day 1 day 815 ? ?# July 11th-gemcitabine and Abraxane  day.  November 15, 2020-CT scan progressive disease/poor tolerance to therapy ? ?# AUG 29th, 2022-gemcitabine single agent weekly-concurrent radiation;  ?December 12, 2020-discontinued gemcitabine single agent because of significant respiratory distress post gemcitabine infusion.  Continue local radiation [finished OCT 3790] ?---------------------------------------------------------  ? ? ?  ?Malignant neoplasm of head of pancreas ([place])  ?08/24/2020 Initial Diagnosis  ? Malignant neoplasm of head of pancreas [facility]) ?  ?09/09/2020 - 09/12/2020 Chemotherapy  ?  ? ?  ? ?  ?10/10/2020 - 12/06/2020 Chemotherapy  ? Patient is on Treatment Plan : PANCREATIC Abraxane / Gemcitabine D1,8,15 q28d  ?   ?06/26/2021 -  Chemotherapy  ? Patient is on Treatment Plan : PANCREAS Liposomal Irinotecan / Leucovorin / 5-FU  q14d  ?   ? ? ? ?Interval history-patient reports she is not feeling well today.  She has been having worsening fatigue and states that her throat hurts.  Denies any worsening shortness of breath.  Denies any bleeding in her stool or urine.  Denies any dark melanotic stools.  Denies any burning urination.  Reports chronic abdominal pain ? ?ECOG PS- 2 ?Pain scale- 0 ? ? ?Review of systems- Review of Systems  ?Constitutional:  Positive for malaise/fatigue. Negative for chills, fever and weight loss.  ?HENT:  Negative for congestion, ear discharge and nosebleeds.   ?Eyes:  Negative for blurred vision.  ?Respiratory:  Negative for cough, hemoptysis, sputum production, shortness of breath and wheezing.   ?Cardiovascular:  Negative for chest pain, palpitations, orthopnea and claudication.  ?Gastrointestinal:  Positive for abdominal pain. Negative for blood in stool, constipation, diarrhea, heartburn, melena, nausea and vomiting.  ?Genitourinary:  Negative for dysuria, flank pain, frequency, hematuria and urgency.  ?Musculoskeletal:  Negative for back pain, joint pain and myalgias.  ?Skin:  Negative for rash.  ?Neurological:  Negative  for dizziness, tingling, focal weakness, seizures, weakness and headaches.  ?Endo/Heme/Allergies:  Does not bruise/bleed easily.  ?Psychiatric/Behavioral:  Negative for depression and suicidal ideas. The patient does not have insomnia.    ? ? ?Allergies  ?Allergen Reactions  ? Iodinated Contrast Media Nausea Only and Other (See Comments)  ? Tramadol Other (See Comments)  ?  unknown  ? Penicillins Rash  ?  Pt states that she has been taking this medication and has had not problem  ? ? ? ?Past Medical History:  ?Diagnosis Date  ? Abdominal pain, generalized   ? Anxiety   ? Atypical chest pain   ? Back pain   ? Basal cell carcinoma of skin 2011-07-11  ? resected from Left scalp area.   ? COPD (chronic obstructive pulmonary disease) ([place])   ? Depression   ? DOE (dyspnea on exertion)   ? GERD (gastroesophageal reflux disease)   ? Hypertension   ? Pancreatic cancer ([place])   ? Shortness of breath dyspnea   ? ? ? ?Past Surgical History:  ?Procedure Laterality Date  ? BASAL CELL CARCINOMA EXCISION  July 11, 2011  ? CARPAL TUNNEL RELEASE Left 90s  ? CHOLECYSTECTOMY N/A 09/16/2015  ? Procedure: LAPAROSCOPIC CHOLECYSTECTOMY WITH INTRAOPERATIVE CHOLANGIOGRAM;  Surgeon: Robert Bellow, MD;  Location: [facility] ORS;  Service: General;  Laterality: N/A;  ? COLONOSCOPY  1990"s  ? [place]  ? ENDOSCOPIC RETROGRADE CHOLANGIOPANCREATOGRAPHY (ERCP) WITH PROPOFOL N/A 11/10/2020  ? Procedure: ENDOSCOPIC RETROGRADE CHOLANGIOPANCREATOGRAPHY (ERCP) WITH PROPOFOL;  Surgeon: Lucilla Lame, MD;  Location: [facility] ENDOSCOPY;  Service: Endoscopy;  Laterality: N/A;  ? ERCP N/A 08/05/2020  ? Procedure: ENDOSCOPIC RETROGRADE CHOLANGIOPANCREATOGRAPHY (ERCP);  Surgeon: Lucilla Lame, MD;  Location: [facility] ENDOSCOPY;  Service: Endoscopy;  Laterality: N/A;  ? ERCP N/A 06/06/2021  ? Procedure: ENDOSCOPIC RETROGRADE CHOLANGIOPANCREATOGRAPHY (ERCP);  Surgeon: Lucilla Lame, MD;  Location: [facility] ENDOSCOPY;  Service: Endoscopy;  Laterality: N/A;  ? ESOPHAGOGASTRODUODENOSCOPY (EGD) WITH  PROPOFOL N/A 02/05/2017  ? Procedure: ESOPHAGOGASTRODUODENOSCOPY (EGD) WITH PROPOFOL;  Surgeon: Lollie Sails, MD;  Location: [facility] ENDOSCOPY;  Service: Endoscopy;  Laterality: N/A;  ? PERIPHERAL VASCULAR CATHETERIZATION Bilateral 12/27/2015  ? Procedure: Lower Extremity Angiography;  Surgeon: Katha Cabal, MD;  Location: [place] CV LAB;  Service: Cardiovascular;  Laterality: Bilateral;  ? PORTA CATH INSERTION N/A 09/08/2020  ? Procedure: PORTA CATH INSERTION;  Surgeon: Algernon Huxley, MD;  Location: [place] CV LAB;  Service: Cardiovascular;  Laterality: N/A;  ? TONSILLECTOMY    ? ? ?Social History  ? ?Socioeconomic History  ? Marital status: Divorced  ?  Spouse name: Not on file  ? Number of children: Not on file  ? Years of education: Not on file  ? Highest education level: Not on file  ?Occupational History  ? Occupation: retired  ?Tobacco Use  ? Smoking status: Former  ?  Types: Cigarettes  ?  Quit date: 03/02/2012  ?  Years since quitting: 9.3  ? Smokeless tobacco: Never  ?Vaping Use  ? Vaping Use: Never used  ?Substance and Sexual Activity  ? Alcohol use: No  ?  Alcohol/week: 0.0 standard drinks  ? Drug use: No  ? Sexual activity: Not on file  ?Other Topics Concern  ? Not on file  ?Social History Narrative  ? [place]; self; smoker- quit 07-11-2011; daughter died of leukemia-2020. 2 sons [[place]; [place]]; used to be caregiver/ retd.   ? ?Social Determinants of Health  ? ?Financial Resource Strain: Not on file  ?Food Insecurity: Not on file  ?  Transportation Needs: Not on file  ?Physical Activity: Not on file  ?Stress: Not on file  ?Social Connections: Not on file  ?Intimate Partner Violence: Not on file  ? ? ?Family History  ?Problem Relation Age of Onset  ? Cerebral palsy Daughter   ? Colon polyps Sister   ? Leukemia Daughter   ? Depression Son   ? Breast cancer Neg Hx   ? ? ? ?Current Outpatient Medications:  ?  apixaban (ELIQUIS) 5 MG TABS tablet, Take 1 tablet (5 mg total) by mouth 2 (two)  times daily., Disp: 60 tablet, Rfl: 5 ?  B Complex-C-Folic Acid (B COMPLEX-VITAMIN C-FOLIC ACID) 1 MG tablet, Take 1 tablet by mouth daily with breakfast., Disp: 30 tablet, Rfl: 0 ?  Fluticasone-[place]

## 2021-06-28 ENCOUNTER — Inpatient Hospital Stay (HOSPITAL_BASED_OUTPATIENT_CLINIC_OR_DEPARTMENT_OTHER): Payer: Medicare HMO | Admitting: Medical Oncology

## 2021-06-28 ENCOUNTER — Inpatient Hospital Stay: Payer: Medicare HMO

## 2021-06-28 ENCOUNTER — Inpatient Hospital Stay: Payer: Medicare HMO | Admitting: Medical Oncology

## 2021-06-28 ENCOUNTER — Other Ambulatory Visit: Payer: Self-pay

## 2021-06-28 ENCOUNTER — Emergency Department: Payer: Medicare HMO

## 2021-06-28 ENCOUNTER — Encounter: Payer: Self-pay | Admitting: Intensive Care

## 2021-06-28 ENCOUNTER — Encounter: Payer: Self-pay | Admitting: Medical Oncology

## 2021-06-28 ENCOUNTER — Inpatient Hospital Stay
Admission: EM | Admit: 2021-06-28 | Discharge: 2021-07-05 | DRG: 871 | Disposition: A | Discharge status: Home / Self Care | Payer: Medicare HMO | Source: Ambulatory Visit | Attending: Internal Medicine | Admitting: Internal Medicine

## 2021-06-28 VITALS — BP 125/79 | HR 108 | Temp 97.6°F | Resp 21 | Wt 93.4 lb

## 2021-06-28 DIAGNOSIS — G8929 Other chronic pain: Secondary | ICD-10-CM | POA: Diagnosis present

## 2021-06-28 DIAGNOSIS — Z91041 Radiographic dye allergy status: Secondary | ICD-10-CM

## 2021-06-28 DIAGNOSIS — E440 Moderate protein-calorie malnutrition: Secondary | ICD-10-CM | POA: Diagnosis present

## 2021-06-28 DIAGNOSIS — C250 Malignant neoplasm of head of pancreas: Secondary | ICD-10-CM | POA: Diagnosis present

## 2021-06-28 DIAGNOSIS — I7409 Other arterial embolism and thrombosis of abdominal aorta: Secondary | ICD-10-CM | POA: Diagnosis present

## 2021-06-28 DIAGNOSIS — R188 Other ascites: Secondary | ICD-10-CM | POA: Diagnosis present

## 2021-06-28 DIAGNOSIS — K8309 Other cholangitis: Secondary | ICD-10-CM | POA: Diagnosis present

## 2021-06-28 DIAGNOSIS — A419 Sepsis, unspecified organism: Principal | ICD-10-CM | POA: Diagnosis present

## 2021-06-28 DIAGNOSIS — I714 Abdominal aortic aneurysm, without rupture, unspecified: Secondary | ICD-10-CM | POA: Diagnosis present

## 2021-06-28 DIAGNOSIS — F32A Depression, unspecified: Secondary | ICD-10-CM | POA: Diagnosis present

## 2021-06-28 DIAGNOSIS — R109 Unspecified abdominal pain: Secondary | ICD-10-CM | POA: Diagnosis present

## 2021-06-28 DIAGNOSIS — J449 Chronic obstructive pulmonary disease, unspecified: Secondary | ICD-10-CM | POA: Diagnosis present

## 2021-06-28 DIAGNOSIS — C801 Malignant (primary) neoplasm, unspecified: Principal | ICD-10-CM | POA: Diagnosis present

## 2021-06-28 DIAGNOSIS — I10 Essential (primary) hypertension: Secondary | ICD-10-CM | POA: Diagnosis present

## 2021-06-28 DIAGNOSIS — K831 Obstruction of bile duct: Secondary | ICD-10-CM | POA: Diagnosis present

## 2021-06-28 DIAGNOSIS — Z85828 Personal history of other malignant neoplasm of skin: Secondary | ICD-10-CM

## 2021-06-28 DIAGNOSIS — Z87891 Personal history of nicotine dependence: Secondary | ICD-10-CM

## 2021-06-28 DIAGNOSIS — R748 Abnormal levels of other serum enzymes: Secondary | ICD-10-CM

## 2021-06-28 DIAGNOSIS — Z681 Body mass index (BMI) 19 or less, adult: Secondary | ICD-10-CM | POA: Diagnosis not present

## 2021-06-28 DIAGNOSIS — C259 Malignant neoplasm of pancreas, unspecified: Secondary | ICD-10-CM | POA: Diagnosis present

## 2021-06-28 DIAGNOSIS — R1011 Right upper quadrant pain: Secondary | ICD-10-CM

## 2021-06-28 DIAGNOSIS — D649 Anemia, unspecified: Secondary | ICD-10-CM | POA: Diagnosis present

## 2021-06-28 DIAGNOSIS — F419 Anxiety disorder, unspecified: Secondary | ICD-10-CM | POA: Diagnosis present

## 2021-06-28 DIAGNOSIS — I513 Intracardiac thrombosis, not elsewhere classified: Secondary | ICD-10-CM | POA: Diagnosis present

## 2021-06-28 DIAGNOSIS — Z79899 Other long term (current) drug therapy: Secondary | ICD-10-CM

## 2021-06-28 DIAGNOSIS — Z888 Allergy status to other drugs, medicaments and biological substances status: Secondary | ICD-10-CM

## 2021-06-28 DIAGNOSIS — R140 Abdominal distension (gaseous): Secondary | ICD-10-CM

## 2021-06-28 DIAGNOSIS — Z20822 Contact with and (suspected) exposure to covid-19: Secondary | ICD-10-CM | POA: Diagnosis present

## 2021-06-28 DIAGNOSIS — Z515 Encounter for palliative care: Secondary | ICD-10-CM | POA: Diagnosis not present

## 2021-06-28 DIAGNOSIS — Z818 Family history of other mental and behavioral disorders: Secondary | ICD-10-CM

## 2021-06-28 DIAGNOSIS — I7143 Infrarenal abdominal aortic aneurysm, without rupture: Secondary | ICD-10-CM | POA: Diagnosis present

## 2021-06-28 DIAGNOSIS — K219 Gastro-esophageal reflux disease without esophagitis: Secondary | ICD-10-CM | POA: Diagnosis present

## 2021-06-28 DIAGNOSIS — D72829 Elevated white blood cell count, unspecified: Secondary | ICD-10-CM | POA: Diagnosis not present

## 2021-06-28 DIAGNOSIS — Z9049 Acquired absence of other specified parts of digestive tract: Secondary | ICD-10-CM | POA: Diagnosis not present

## 2021-06-28 DIAGNOSIS — K5900 Constipation, unspecified: Secondary | ICD-10-CM | POA: Diagnosis present

## 2021-06-28 DIAGNOSIS — R0600 Dyspnea, unspecified: Secondary | ICD-10-CM

## 2021-06-28 LAB — URINALYSIS, ROUTINE W REFLEX MICROSCOPIC
Bilirubin Urine: NEGATIVE
Glucose, UA: NEGATIVE mg/dL
Hgb urine dipstick: NEGATIVE
Ketones, ur: 5 mg/dL — AB
Nitrite: NEGATIVE
Protein, ur: NEGATIVE mg/dL
Specific Gravity, Urine: 1.02 (ref 1.005–1.030)
pH: 5 (ref 5.0–8.0)

## 2021-06-28 LAB — COMPREHENSIVE METABOLIC PANEL
ALT: 19 U/L (ref 0–44)
ALT: 19 U/L (ref 0–44)
AST: 32 U/L (ref 15–41)
AST: 27 U/L (ref 15–41)
Albumin: 2.4 g/dL — ABNORMAL LOW (ref 3.5–5.0)
Albumin: 2.1 g/dL — ABNORMAL LOW (ref 3.5–5.0)
Alkaline Phosphatase: 924 U/L — ABNORMAL HIGH (ref 38–126)
Alkaline Phosphatase: 827 U/L — ABNORMAL HIGH (ref 38–126)
Anion gap: 11 (ref 5–15)
Anion gap: 8 (ref 5–15)
BUN: 16 mg/dL (ref 8–23)
BUN: 15 mg/dL (ref 8–23)
CO2: 22 mmol/L (ref 22–32)
CO2: 22 mmol/L (ref 22–32)
Calcium: 8.7 mg/dL — ABNORMAL LOW (ref 8.9–10.3)
Calcium: 8 mg/dL — ABNORMAL LOW (ref 8.9–10.3)
Chloride: 101 mmol/L (ref 98–111)
Chloride: 106 mmol/L (ref 98–111)
Creatinine, Ser: 0.64 mg/dL (ref 0.44–1.00)
Creatinine, Ser: 0.54 mg/dL (ref 0.44–1.00)
GFR, Estimated: >60 mL/min (ref >60)
GFR, Estimated: >60 mL/min (ref >60)
Glucose, Bld: 116 mg/dL — ABNORMAL HIGH (ref 70–99)
Glucose, Bld: 96 mg/dL (ref 70–99)
Potassium: 3.5 mmol/L (ref 3.5–5.1)
Potassium: 3.6 mmol/L (ref 3.5–5.1)
Sodium: 134 mmol/L — ABNORMAL LOW (ref 135–145)
Sodium: 136 mmol/L (ref 135–145)
Total Bilirubin: 1.3 mg/dL — ABNORMAL HIGH (ref 0.3–1.2)
Total Bilirubin: 1.4 mg/dL — ABNORMAL HIGH (ref 0.3–1.2)
Total Protein: 7.2 g/dL (ref 6.5–8.1)
Total Protein: 6.6 g/dL (ref 6.5–8.1)

## 2021-06-28 LAB — CBC WITH DIFFERENTIAL/PLATELET
Abs Immature Granulocytes: 0.23 10*3/uL — ABNORMAL HIGH (ref 0.00–0.07)
Abs Immature Granulocytes: 0.15 10*3/uL — ABNORMAL HIGH (ref 0.00–0.07)
Basophils Absolute: 0 10*3/uL (ref 0.0–0.1)
Basophils Absolute: 0 10*3/uL (ref 0.0–0.1)
Basophils Relative: 0 %
Basophils Relative: 0 %
Eosinophils Absolute: 0 10*3/uL (ref 0.0–0.5)
Eosinophils Absolute: 0 10*3/uL (ref 0.0–0.5)
Eosinophils Relative: 0 %
Eosinophils Relative: 0 %
HCT: 21.9 % — ABNORMAL LOW (ref 36.0–46.0)
HCT: 19.2 % — ABNORMAL LOW (ref 36.0–46.0)
Hemoglobin: 7.1 g/dL — ABNORMAL LOW (ref 12.0–15.0)
Hemoglobin: 6.2 g/dL — ABNORMAL LOW (ref 12.0–15.0)
Immature Granulocytes: 1 %
Immature Granulocytes: 1 %
Lymphocytes Relative: 6 %
Lymphocytes Relative: 5 %
Lymphs Abs: 1.2 10*3/uL (ref 0.7–4.0)
Lymphs Abs: 0.8 10*3/uL (ref 0.7–4.0)
MCH: 25.7 pg — ABNORMAL LOW (ref 26.0–34.0)
MCH: 25.9 pg — ABNORMAL LOW (ref 26.0–34.0)
MCHC: 32.4 g/dL (ref 30.0–36.0)
MCHC: 32.3 g/dL (ref 30.0–36.0)
MCV: 79.3 fL — ABNORMAL LOW (ref 80.0–100.0)
MCV: 80.3 fL (ref 80.0–100.0)
Monocytes Absolute: 1.1 10*3/uL — ABNORMAL HIGH (ref 0.1–1.0)
Monocytes Absolute: 0.9 10*3/uL (ref 0.1–1.0)
Monocytes Relative: 6 %
Monocytes Relative: 6 %
Neutro Abs: 16.7 10*3/uL — ABNORMAL HIGH (ref 1.7–7.7)
Neutro Abs: 12.2 10*3/uL — ABNORMAL HIGH (ref 1.7–7.7)
Neutrophils Relative %: 87 %
Neutrophils Relative %: 88 %
Platelets: 513 10*3/uL — ABNORMAL HIGH (ref 150–400)
Platelets: 382 10*3/uL (ref 150–400)
RBC: 2.76 MIL/uL — ABNORMAL LOW (ref 3.87–5.11)
RBC: 2.39 MIL/uL — ABNORMAL LOW (ref 3.87–5.11)
RDW: 15.5 % (ref 11.5–15.5)
RDW: 15.5 % (ref 11.5–15.5)
WBC: 19.3 10*3/uL — ABNORMAL HIGH (ref 4.0–10.5)
WBC: 14 10*3/uL — ABNORMAL HIGH (ref 4.0–10.5)
nRBC: 0 % (ref 0.0–0.2)
nRBC: 0 % (ref 0.0–0.2)

## 2021-06-28 LAB — RESP PANEL BY RT-PCR (FLU A&B, COVID) ARPGX2
Influenza A by PCR: NEGATIVE
Influenza B by PCR: NEGATIVE
SARS Coronavirus 2 by RT PCR: NEGATIVE

## 2021-06-28 LAB — LACTIC ACID, PLASMA: Lactic Acid, Venous: 1.5 mmol/L (ref 0.5–1.9)

## 2021-06-28 LAB — LIPASE, BLOOD: Lipase: 67 U/L — ABNORMAL HIGH (ref 11–51)

## 2021-06-28 LAB — ABO/RH: ABO/RH(D): O POS

## 2021-06-28 LAB — PREPARE RBC (CROSSMATCH)

## 2021-06-28 IMAGING — CT CT ABD-PELV W/ CM
2 of 5 series · 14 of 46 positions shown, 16 images · IV contrast (APPLIED)
Comparison: CT abdomen pelvis, [DATE] MR abdomen, [DATE]

CLINICAL DATA: Pancreatic cancer, increased right upper quadrant
pain, evaluate for biliary obstruction, small-bowel obstruction *
Tracking Code: BO *

EXAM:
CT ABDOMEN AND PELVIS WITH CONTRAST
TECHNIQUE: Multidetector CT imaging of the abdomen and pelvis was performed
using the standard protocol following bolus administration of
intravenous contrast.

[Series 2: routine abd/pel with · axial · 0.62mm/px · z∈[-1127,-762]mm · 11 of 83 slices shown, 13 images]
[im 5/83  soft-tissue]
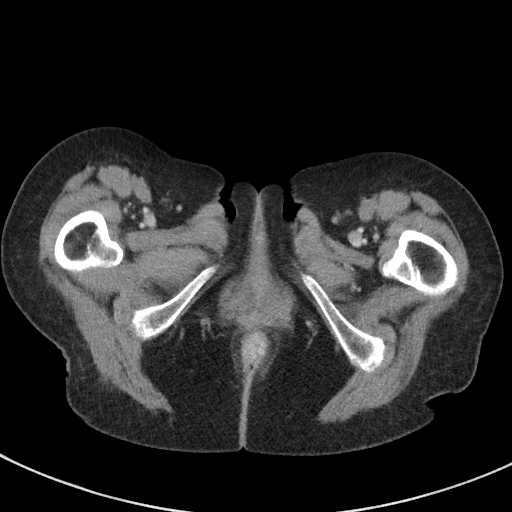
[im 5/83  bone]
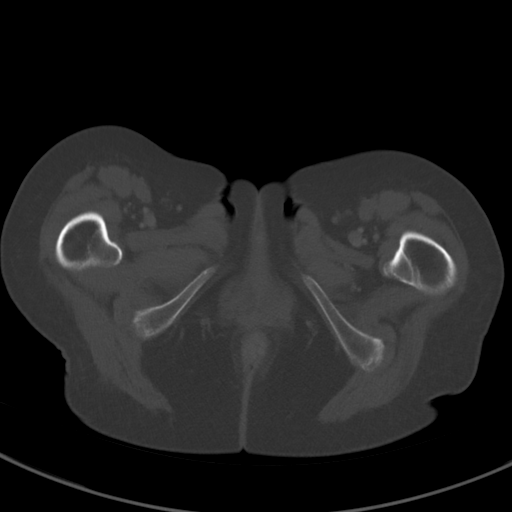
[im 14/83  soft-tissue]
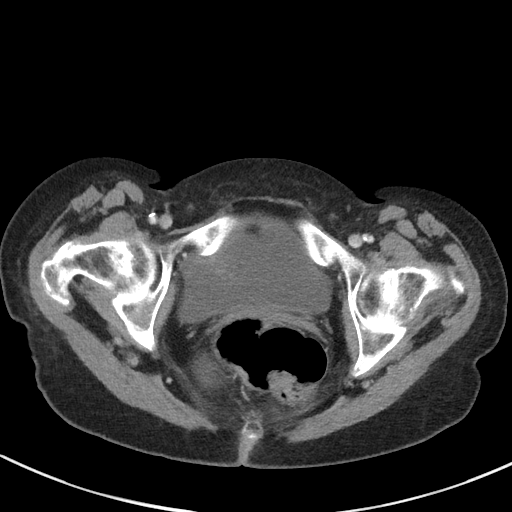
[im 19/83  soft-tissue]
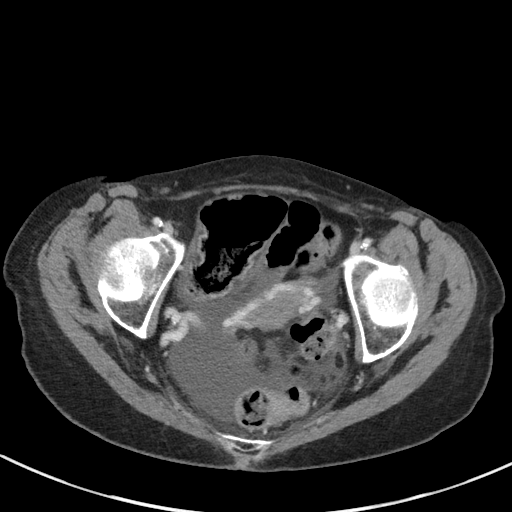
[im 28/83  soft-tissue]
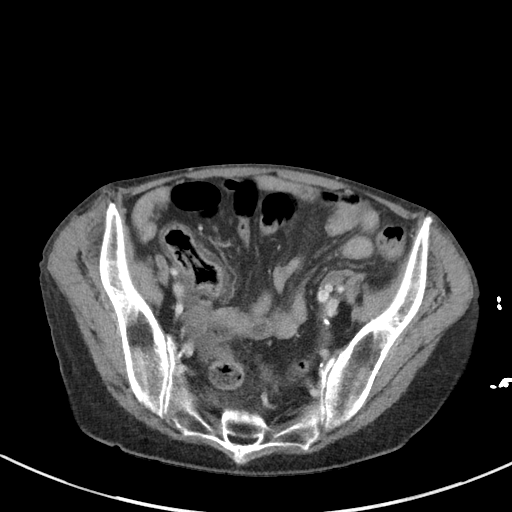
[im 32/83  soft-tissue]
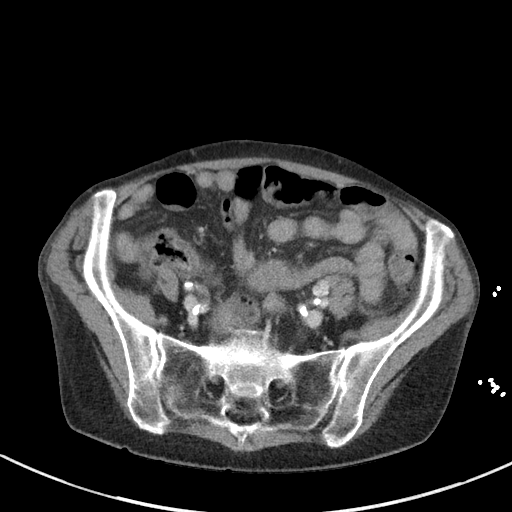
[im 42/83  soft-tissue]
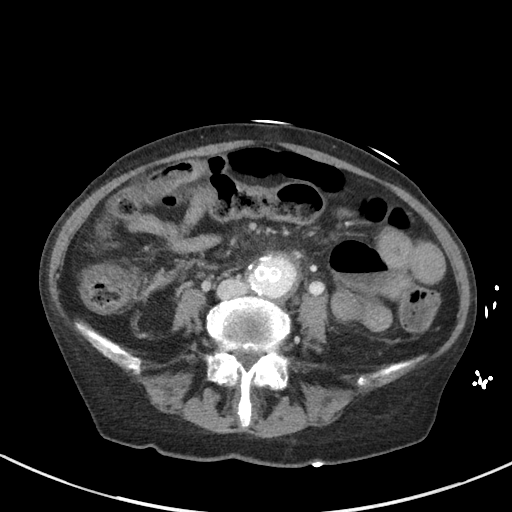
[im 51/83  soft-tissue]
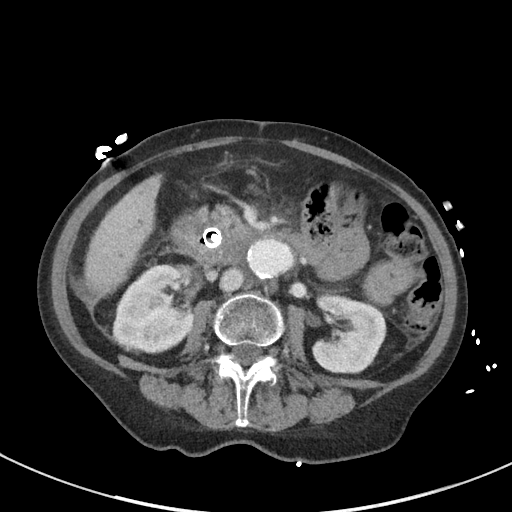
[im 55/83  soft-tissue]
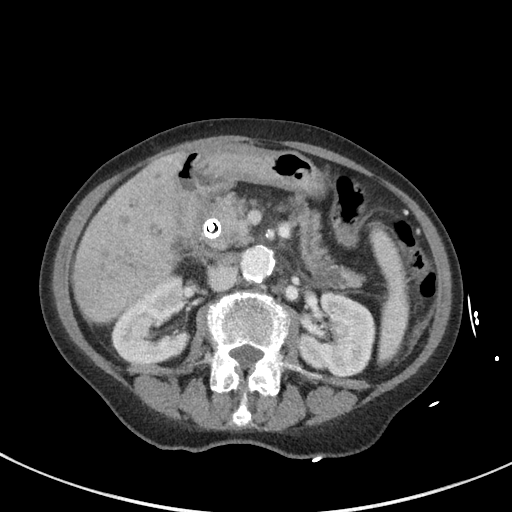
[im 64/83  soft-tissue]
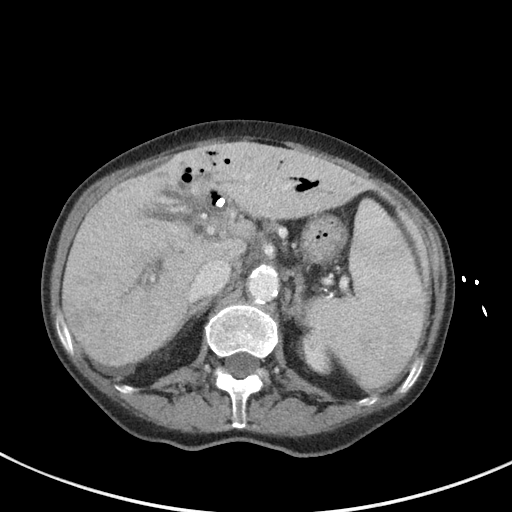
[im 64/83  bone]
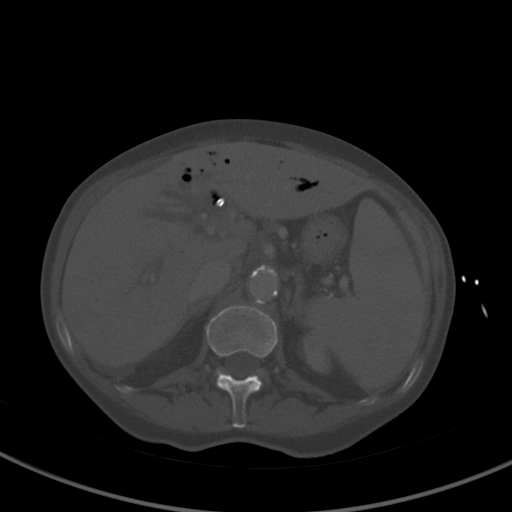
[im 69/83  soft-tissue]
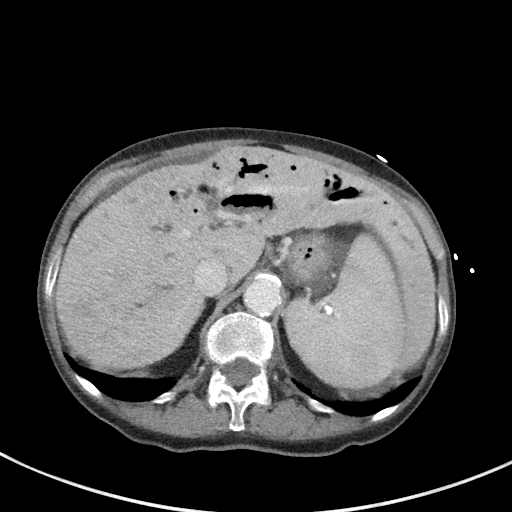
[im 78/83  soft-tissue]
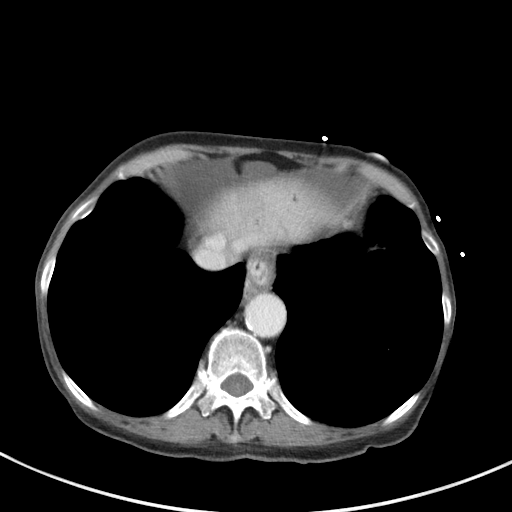

[Series 5: coronal st · coronal · 0.64mm/px · 3 of 71 slices shown]
[im 24/71  soft-tissue]
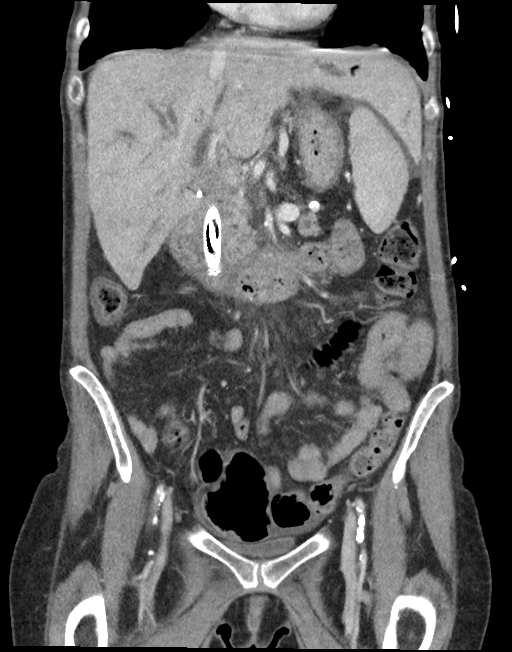
[im 32/71  soft-tissue]
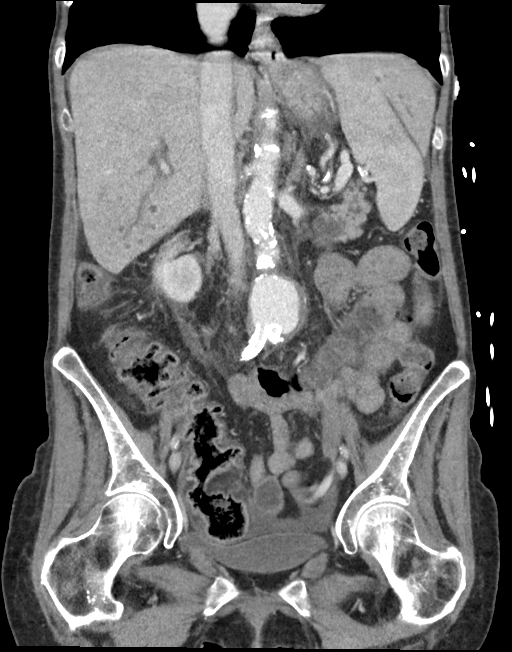
[im 39/71  soft-tissue]
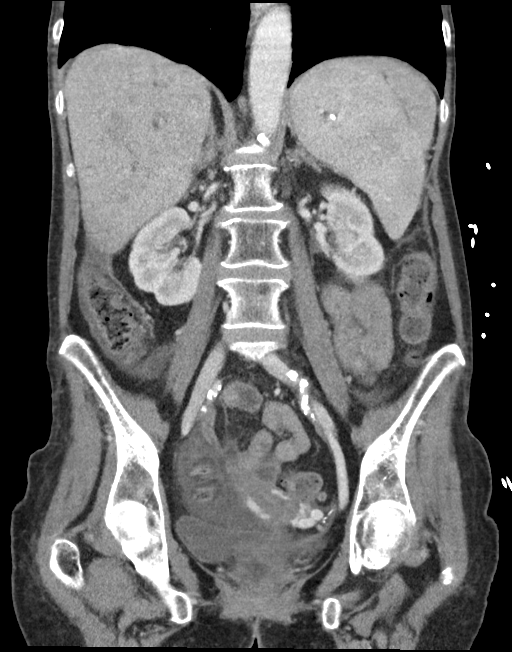

[14 of 46 positions shown; findings below may reference images not displayed]

RADIATION DOSE REDUCTION: This exam was performed according to the
departmental dose-optimization program which includes automated
exposure control, adjustment of the mA and/or kV according to
patient size and/or use of iterative reconstruction technique.

CONTRAST:  80mL OMNIPAQUE IOHEXOL 300 MG/ML  SOLN
FINDINGS: Lower chest: No acute abnormality.  Emphysema.

Hepatobiliary: Redemonstrated large-bore left hepatic and common
bile duct stent. There has been interval placement of an additional
stent catheter within this larger stent. Severe intrahepatic biliary
ductal dilatation and post stenting pneumobilia, increased compared
to prior examination. Subtle, amorphous heterogeneous
hypoenhancement throughout the liver parenchyma, particularly
conspicuous in the right lobe (series 2, image 17).

Pancreas: Unchanged hypodense mass of the pancreatic head, with
atrophy and pancreatic ductal dilatation distally. Degree of
pancreatic ductal dilatation has increased compared to prior
examination, now measuring up to 0.9 cm in caliber (series 2, image
27). The central portal vein is now effaced by pancreatic mass and
adjacent portacaval soft tissue (series 2, image 25).

Spleen: Normal in size without significant abnormality.

Adrenals/Urinary Tract: Adrenal glands are unremarkable. Kidneys are
normal, without renal calculi, solid lesion, or hydronephrosis.
Bladder is unremarkable.

Stomach/Bowel: Stomach is within normal limits. Appendix appears
normal. Wall thickening and edema of the cecal base (series 2, image
61).

Vascular/Lymphatic: Aortic atherosclerosis. Unchanged infrarenal
abdominal aortic aneurysm with eccentric mural thrombus, measuring
3.9 x 3.5 cm in caliber. Bilateral common iliac artery stents. Newly
prominent, indistinct and matted appearing retroperitoneal lymph
nodes, largest retroaortic node measuring 1.1 x 1.1 cm (series 2,
image 32).

Reproductive: No mass or other significant abnormality. Prominent
bilateral ovarian and uterine varices

Other: No abdominal wall hernia or abnormality. Small volume ascites
throughout the abdomen and pelvis, slightly increased compared to
prior examination.

Musculoskeletal: No acute or significant osseous findings.
IMPRESSION: 1. Redemonstrated large-bore left hepatic and common bile duct
stent. There has been interval placement of an additional stent
catheter within this larger stent. Severe intrahepatic biliary
ductal dilatation and post stenting pneumobilia, increased compared
to prior examination.
2. Unchanged hypodense mass of the pancreatic head, with atrophy and
pancreatic ductal dilatation distally. Degree of pancreatic ductal
dilatation has increased compared to prior examination, now
measuring up to 0.9 cm in caliber.
3. The central portal vein is now effaced by pancreatic mass and
increased, adjacent portacaval soft tissue and or matted lymph
nodes.
4. Newly prominent, indistinct and matted appearing retroperitoneal
lymph nodes.
5. Subtle, amorphous heterogeneous hypoenhancement throughout the
liver parenchyma, particularly conspicuous in the right lobe. This
most likely reflects perfusion abnormalities in the setting of new
portal vein occlusion, although could be seen in the setting of
cholangitis. New hepatic metastatic disease is a general
differential consideration.
6. Wall thickening and edema of the cecal base, which may reflect
portal colopathy or, given reported history of acute illness,
nonspecific infectious, inflammatory, or ischemic colitis, including
typhlitis in the setting of immunocompromise.
7. Small volume ascites throughout the abdomen and pelvis, slightly
increased compared to prior examination.
8. Unchanged infrarenal abdominal aortic aneurysm with eccentric
mural thrombus, measuring 3.9 x 3.5 cm in caliber. Recommend
follow-up ultrasound every 2 years if not otherwise imaged and if
clinically appropriate in the setting of advanced malignancy. This
recommendation follows ACR consensus guidelines: White Paper of the
ACR Incidental Findings Committee II on Vascular Findings. [HOSPITAL] [C5]; [DATE].

Aortic Atherosclerosis ([C5]-[C5]).

## 2021-06-28 MED ORDER — ONDANSETRON HCL 4 MG/2ML IJ SOLN
4.0000 mg | Freq: Once | INTRAMUSCULAR | Status: AC
  Administered 2021-06-28: 4 mg via INTRAVENOUS
  Filled 2021-06-28: qty 2

## 2021-06-28 MED ORDER — SODIUM CHLORIDE 0.9% FLUSH
10.0000 mL | Freq: Once | INTRAVENOUS | Status: AC | PRN
  Administered 2021-06-28: 10 mL
  Filled 2021-06-28: qty 10

## 2021-06-28 MED ORDER — IOHEXOL 300 MG/ML  SOLN
80.0000 mL | Freq: Once | INTRAMUSCULAR | Status: AC | PRN
  Administered 2021-06-28: 80 mL via INTRAVENOUS

## 2021-06-28 MED ORDER — ONDANSETRON HCL 4 MG PO TABS: 4.0000 mg | ORAL_TABLET | Freq: Four times a day (QID) | ORAL | Status: DC | PRN

## 2021-06-28 MED ORDER — HEPARIN SOD (PORK) LOCK FLUSH 100 UNIT/ML IV SOLN
500.0000 [IU] | Freq: Once | INTRAVENOUS | Status: DC | PRN
  Filled 2021-06-28: qty 5

## 2021-06-28 MED ORDER — LEVOFLOXACIN IN D5W 500 MG/100ML IV SOLN
500.0000 mg | Freq: Once | INTRAVENOUS | Status: AC
  Administered 2021-06-28: 500 mg via INTRAVENOUS
  Filled 2021-06-28: qty 100

## 2021-06-28 MED ORDER — LACTATED RINGERS IV BOLUS (SEPSIS)
500.0000 mL | Freq: Once | INTRAVENOUS | Status: AC
  Administered 2021-06-28: 500 mL via INTRAVENOUS

## 2021-06-28 MED ORDER — LACTATED RINGERS IV BOLUS
1000.0000 mL | Freq: Once | INTRAVENOUS | Status: AC
  Administered 2021-06-28: 1000 mL via INTRAVENOUS

## 2021-06-28 MED ORDER — SODIUM CHLORIDE 0.9 % IV SOLN
2.0000 g | Freq: Two times a day (BID) | INTRAVENOUS | Status: AC
  Administered 2021-06-28 – 2021-07-04 (×13): 2 g via INTRAVENOUS
  Filled 2021-06-28 (×14): qty 2

## 2021-06-28 MED ORDER — ONDANSETRON HCL 4 MG/2ML IJ SOLN
4.0000 mg | Freq: Four times a day (QID) | INTRAMUSCULAR | Status: DC | PRN
  Administered 2021-07-03: 4 mg via INTRAVENOUS
  Filled 2021-06-28: qty 2

## 2021-06-28 MED ORDER — HYDROMORPHONE HCL 1 MG/ML IJ SOLN
0.5000 mg | Freq: Once | INTRAMUSCULAR | Status: AC
  Administered 2021-06-28: 0.5 mg via INTRAVENOUS
  Filled 2021-06-28: qty 1

## 2021-06-28 MED ORDER — LACTATED RINGERS IV SOLN: INTRAVENOUS | Status: DC

## 2021-06-28 MED ORDER — MORPHINE SULFATE (PF) 2 MG/ML IV SOLN
2.0000 mg | INTRAVENOUS | Status: DC | PRN
  Administered 2021-06-29 – 2021-07-05 (×14): 2 mg via INTRAVENOUS
  Filled 2021-06-28 (×14): qty 1

## 2021-06-28 MED ORDER — SODIUM CHLORIDE 0.9 % IV SOLN
Freq: Once | INTRAVENOUS | Status: AC
  Filled 2021-06-28: qty 250

## 2021-06-28 MED ORDER — METRONIDAZOLE 500 MG/100ML IV SOLN
500.0000 mg | Freq: Two times a day (BID) | INTRAVENOUS | Status: AC
  Administered 2021-06-29 – 2021-07-04 (×13): 500 mg via INTRAVENOUS
  Filled 2021-06-28 (×14): qty 100

## 2021-06-28 MED ORDER — SODIUM CHLORIDE 0.9 % IV SOLN: 10.0000 mL/h | Freq: Once | INTRAVENOUS | Status: DC

## 2021-06-28 NOTE — Assessment & Plan Note (Signed)
Continue Advair with DuoNebs as needed ?

## 2021-06-28 NOTE — Progress Notes (Signed)
Started on an antibiotic for high wbc's on Monday. Dyspnea is the same. Having pain in R abdomen ever since having stent put back in. She has a temporary plastic stent in. Appetite is good for a little while then she doesn't want anything to eat. Bowels not moving good cause she is not eating. Wakes up nauseated every morning chronic in nature. Has nothing to do with current Levaquin. Throat gets sore from being parched at night time.Marland Kitchen ?

## 2021-06-28 NOTE — Assessment & Plan Note (Signed)
Hold Xanax ?

## 2021-06-28 NOTE — ED Notes (Signed)
Called [facility] for an estimate for consult spoke to [place] @ 2016 who needed to ask Md more questions  ?

## 2021-06-28 NOTE — Progress Notes (Signed)
Pt received 1 L NS. Port needle saline locked. Transported to ED at completion of fluids. ?

## 2021-06-28 NOTE — Assessment & Plan Note (Signed)
Hold home antihypertensives due to soft blood pressures ?

## 2021-06-28 NOTE — ED Triage Notes (Addendum)
Patient sent from cancer center due to being worried about infection. Currently on antibiotics. Pancreatic Cancer. Was suppose to have chemo Monday but was cancelled due to being sick. Patient A&O x4. Patient arrived with port accessed from cancer center. Reports worsening sob the last few days ?

## 2021-06-28 NOTE — ED Notes (Signed)
Shirley Barton at [facility] for transfer, images powershared [phone] ?

## 2021-06-28 NOTE — Assessment & Plan Note (Addendum)
Suspected etiology includes acute cholangitis given WBC 20,000 on 3/27, fever of 100.4 in oncology office on 3/29, worsening biliary obstruction, malaise ?Cefepime Flagyl and Vanco for intra-abdominal/sepsis of unknown source, concerning for cholangitis ?Cultures remain negative. ?-Continue with cefepime and Flagyl to complete the course. ?

## 2021-06-28 NOTE — ED Notes (Signed)
Pt given food per permission from provider. Pt aware that she is NPO at midnight  ?

## 2021-06-28 NOTE — Progress Notes (Signed)
? ? ? ?Hematology/Oncology Consult note ?[place]  ?Telephone:(336) [phone] [phone] ? ?Patient Care Team: ?Erma Heritage, MD as PCP - General (Family Medicine) ?Rico Junker, RN as Registered Nurse ?Theodore Demark, RN as Registered Nurse ?Adron Bene as Physician Assistant (Gastroenterology) ?Robert Bellow, MD (General Surgery) ?Clent Jacks, RN as Oncology Nurse Navigator ?Cammie Sickle, MD as Consulting Physician (Hematology and Oncology)  ? ?Name of the patient: Shirley Barton  ?[id number]  ?Aug 23, 1950  ? ?Date of visit: 06/28/21 ? ?Diagnosis-metastatic pancreatic cancer ? ?Chief complaint/ Reason for visit-on treatment assessment prior to cycle 1 of 5-FU nab irinotecan chemotherapy ? ?Heme/Onc history:  ?Oncology History Overview Note  ?# MAY 2022-pancreatic adenocarcinoma [obstructive jaundice-]MRI-18 mm pancreatic head mass; EUS [[facility]; Dr.Spaete]-abutment of portal vein/SMV; no lymphadenopathy noted; May 5 CA 19-9-163; s/p stenting -May 25th-47.  ? ?#Obstructive jaundice-s/p ERCP stenting [Dr.Wohl] ? ?MAY 2022- ENDOSONOGRAPHIC FINDING: : ?A round mass was identified in the pancreatic head.  ?The mass was hypoechoic. The mass measured 16 mm by 17  ?mm in maximal cross-sectional diameter. The outer  ?margins were irregular. There was sonographic evidence  ?suggesting invasion into the portal vein (manifested  ?by abutment) and the superior mesenteric vein  ?(manifested by abutment).  ? ?#Pancreatic adenocarcinoma pT1 however on EUS/CT [[facility]. Dr.Shah]-concerning for tumor abutting the celiac axis /common hepatic artery /portal vein/superior mesenteric vein; without any metastatic disease.  Borderline resectable Elevated CA 19-9.  [S/p evaluation at [place] ? ?# June 9th- FOLFIRINOX cycle #1; discontinued because of poor GI tolerance ? ?# &/01-2021-gemcitabine Abraxane ['100mg'$ /2] day 1 day 815 ? ?# July 11th-gemcitabine and Abraxane  day.  November 15, 2020-CT scan progressive disease/poor tolerance to therapy ? ?# AUG 29th, 2022-gemcitabine single agent weekly-concurrent radiation;  ?December 12, 2020-discontinued gemcitabine single agent because of significant respiratory distress post gemcitabine infusion.  Continue local radiation [finished OCT 6629] ?---------------------------------------------------------  ? ? ?  ?Malignant neoplasm of head of pancreas ([place])  ?08/24/2020 Initial Diagnosis  ? Malignant neoplasm of head of pancreas [facility]) ?  ?09/09/2020 - 09/12/2020 Chemotherapy  ?  ? ?  ? ?  ?10/10/2020 - 12/06/2020 Chemotherapy  ? Patient is on Treatment Plan : PANCREATIC Abraxane / Gemcitabine D1,8,15 q28d  ?   ?06/26/2021 -  Chemotherapy  ? Patient is on Treatment Plan : PANCREAS Liposomal Irinotecan / Leucovorin / 5-FU  q14d  ?   ? ?Accompanied by her daughter in law today  ? ?Interval history- At her last visit two days ago she was started on Levaquin 500 mg daily x 10 days for suspected sepsis of unknown origin. Blood cultures pending. Today she reports that she is not feeling any better- possibly worse. No appetite, fevers off/on Tmax 100.19F, fatigue, nausea. Continued RUQ abdominal pain which she reports is stable to possibly slightly worse- same for her SOB. She denies vomiting, diarrhea, hemoptysis, chest pain.  ? ?ECOG PS- 2 ?Pain scale- 7 ? ? ?Review of systems- Review of Systems  ?Constitutional:  Positive for chills, fever and malaise/fatigue. Negative for weight loss.  ?HENT:  Negative for congestion, ear discharge and nosebleeds.   ?Eyes:  Negative for blurred vision.  ?Respiratory:  Positive for shortness of breath. Negative for cough, hemoptysis, sputum production and wheezing.   ?Cardiovascular:  Negative for chest pain, palpitations, orthopnea, claudication and leg swelling.  ?Gastrointestinal:  Positive for abdominal pain. Negative for blood in stool, constipation, diarrhea, heartburn, melena, nausea and vomiting.   ?  Genitourinary:  Negative for dysuria, flank pain, frequency, hematuria and urgency.  ?Musculoskeletal:  Negative for back pain, joint pain and myalgias.  ?Skin:  Negative for rash.  ?Neurological:  Negative for dizziness, tingling, focal weakness, seizures, weakness and headaches.  ?Endo/Heme/Allergies:  Does not bruise/bleed easily.  ?Psychiatric/Behavioral:  Negative for depression and suicidal ideas. The patient does not have insomnia.    ? ? ?Allergies  ?Allergen Reactions  ? Iodinated Contrast Media Nausea Only and Other (See Comments)  ? Tramadol Other (See Comments)  ?  unknown  ? Penicillins Rash  ?  Pt states that she has been taking this medication and has had not problem  ? ? ? ?Past Medical History:  ?Diagnosis Date  ? Abdominal pain, generalized   ? Anxiety   ? Atypical chest pain   ? Back pain   ? Basal cell carcinoma of skin Jun 17, 2011  ? resected from Left scalp area.   ? COPD (chronic obstructive pulmonary disease) ([place])   ? Depression   ? DOE (dyspnea on exertion)   ? GERD (gastroesophageal reflux disease)   ? Hypertension   ? Pancreatic cancer ([place])   ? Shortness of breath dyspnea   ? ? ? ?Past Surgical History:  ?Procedure Laterality Date  ? BASAL CELL CARCINOMA EXCISION  2011-06-17  ? CARPAL TUNNEL RELEASE Left 90s  ? CHOLECYSTECTOMY N/A 09/16/2015  ? Procedure: LAPAROSCOPIC CHOLECYSTECTOMY WITH INTRAOPERATIVE CHOLANGIOGRAM;  Surgeon: Robert Bellow, MD;  Location: [facility] ORS;  Service: General;  Laterality: N/A;  ? COLONOSCOPY  1990"s  ? [place]  ? ENDOSCOPIC RETROGRADE CHOLANGIOPANCREATOGRAPHY (ERCP) WITH PROPOFOL N/A 11/10/2020  ? Procedure: ENDOSCOPIC RETROGRADE CHOLANGIOPANCREATOGRAPHY (ERCP) WITH PROPOFOL;  Surgeon: Lucilla Lame, MD;  Location: [facility] ENDOSCOPY;  Service: Endoscopy;  Laterality: N/A;  ? ERCP N/A 08/05/2020  ? Procedure: ENDOSCOPIC RETROGRADE CHOLANGIOPANCREATOGRAPHY (ERCP);  Surgeon: Lucilla Lame, MD;  Location: [facility] ENDOSCOPY;  Service: Endoscopy;  Laterality: N/A;  ? ERCP N/A  06/06/2021  ? Procedure: ENDOSCOPIC RETROGRADE CHOLANGIOPANCREATOGRAPHY (ERCP);  Surgeon: Lucilla Lame, MD;  Location[facility] ENDOSCOPY;  Service: Endoscopy;  Laterality: N/A;  ? ESOPHAGOGASTRODUODENOSCOPY (EGD) WITH PROPOFOL N/A 02/05/2017  ? Procedure: ESOPHAGOGASTRODUODENOSCOPY (EGD) WITH PROPOFOL;  Surgeon: Lollie Sails, MD;  Location: [facility] ENDOSCOPY;  Service: Endoscopy;  Laterality: N/A;  ? PERIPHERAL VASCULAR CATHETERIZATION Bilateral 12/27/2015  ? Procedure: Lower Extremity Angiography;  Surgeon: Katha Cabal, MD;  Location: [place] CV LAB;  Service: Cardiovascular;  Laterality: Bilateral;  ? PORTA CATH INSERTION N/A 09/08/2020  ? Procedure: PORTA CATH INSERTION;  Surgeon: Algernon Huxley, MD;  Location: [place] CV LAB;  Service: Cardiovascular;  Laterality: N/A;  ? TONSILLECTOMY    ? ? ?Social History  ? ?Socioeconomic History  ? Marital status: Divorced  ?  Spouse name: Not on file  ? Number of children: Not on file  ? Years of education: Not on file  ? Highest education level: Not on file  ?Occupational History  ? Occupation: retired  ?Tobacco Use  ? Smoking status: Former  ?  Types: Cigarettes  ?  Quit date: 03/02/2012  ?  Years since quitting: 9.3  ? Smokeless tobacco: Never  ?Vaping Use  ? Vaping Use: Never used  ?Substance and Sexual Activity  ? Alcohol use: No  ?  Alcohol/week: 0.0 standard drinks  ? Drug use: No  ? Sexual activity: Not on file  ?Other Topics Concern  ? Not on file  ?Social History Narrative  ? [place]; self; smoker- quit 06/17/2011; daughter died of  leukemia-2020. 2 sons [[place]; [place]]; used to be caregiver/ retd.   ? ?Social Determinants of Health  ? ?Financial Resource Strain: Not on file  ?Food Insecurity: Not on file  ?Transportation Needs: Not on file  ?Physical Activity: Not on file  ?Stress: Not on file  ?Social Connections: Not on file  ?Intimate Partner Violence: Not on file  ? ? ?Family History  ?Problem Relation Age of Onset  ? Cerebral palsy Daughter   ?  Colon polyps Sister   ? Leukemia Daughter   ? Depression Son   ? Breast cancer Neg Hx   ? ? ? ?Current Outpatient Medications:  ?  acetaminophen (TYLENOL) 325 MG tablet, Take 650 mg by mouth every 6 (six) hours as need

## 2021-06-28 NOTE — Assessment & Plan Note (Signed)
3.9 cm AAA with mural thrombus ?Not currently on apixaban ?

## 2021-06-28 NOTE — ED Notes (Signed)
Images powershared to [facility] 

## 2021-06-28 NOTE — ED Notes (Addendum)
Blood work consent signed by pt. Pt family member present at bedside during consent. Pt understands all risks of receiving blood. Pt agrees and signs consent at this time.  ?

## 2021-06-28 NOTE — ED Notes (Signed)
Patient to CT at this time

## 2021-06-28 NOTE — Assessment & Plan Note (Addendum)
Patient with stage IV pancreatic cancer.  Admitted for recurrent biliary obstruction. ?Patient has had multiple biliary stents placed.  Last biliary stent removed and exchanged for a metallic biliary stent. ?Continue to have abdominal pain. ?-Continue with pain management ?-Ordered abdominal ultrasound and x-ray for worsening abdominal distention. ?

## 2021-06-28 NOTE — ED Provider Notes (Signed)
? ?[facility] ?Provider Note ? ? ? Event Date/Time  ? First MD Initiated Contact with Patient 06/28/21 1612   ?  (approximate) ? ? ?History  ? ?Abnormal Lab ? ? ?HPI ? ?Shirley Barton is a 71 y.o. female who presents to the ED for evaluation of Abnormal Lab ?  ?I reviewed outpatient oncology visit from this morning.  History of pancreatic adenocarcinoma being treated with chemotherapy.  On the visit on 3/27 patient was started on Levaquin 500 mg daily for 10 days for suspected sepsis of uncertain etiology.  Patient has continued symptoms of generalized malaise, poor appetite, low-grade fevers, fatigue and nausea. ? ?Patient presents to the ED with her son for evaluation of concerns for infection in the setting of increasing RUQ abdominal pain, weakness, low-grade fevers and poor appetite.  Patient reports "just feeling awful" and reports acute on chronic pain to her RUQ.  Reports her stent was revised by Dr. Allen Norris about 3 weeks ago due to a clogged stent. ? ?Reports constipation and diffuse abdominal pain.  Reports red-colored urine last week and dark-colored urine today but no dysuria, frequency or other changes. ? ? ?Physical Exam  ? ?Triage Vital Signs: ?ED Triage Vitals  ?Enc Vitals Group  ?   BP 06/28/21 1508 115/73  ?   Pulse Rate 06/28/21 1508 (!) 114  ?   Resp 06/28/21 1508 20  ?   Temp 06/28/21 1508 98.5 ?F (36.9 ?C)  ?   Temp Source 06/28/21 1508 Oral  ?   SpO2 06/28/21 1508 97 %  ?   Weight 06/28/21 1509 93 lb (42.2 kg)  ?   Height 06/28/21 1509 5' 3.5" (1.613 m)  ?   Head Circumference --   ?   Peak Flow --   ?   Pain Score 06/28/21 1509 7  ?   Pain Loc --   ?   Pain Edu? --   ?   Excl. in [place]? --   ? ? ?Most recent vital signs: ?Vitals:  ? 06/28/21 2045 06/28/21 2100  ?BP: 108/72 (!) 103/57  ?Pulse: 99 (!) 101  ?Resp: 18   ?Temp: 98.2 ?F (36.8 ?C)   ?SpO2: 100% 100%  ? ? ?General: Awake, no distress.  Chronically ill and cachectic.  Pale. ?CV:  Good peripheral perfusion.  Tachycardic  and regular. ?Resp:  Normal effort.  ?Abd:  No distention.  Diffuse abdominal tenderness with voluntary guarding on deeper palpation making examination difficult. ?MSK:  No deformity noted.  Port to the right chest ?Neuro:  No focal deficits appreciated. ?Other:   ? ? ?ED Results / Procedures / Treatments  ? ?Labs ?(all labs ordered are listed, but only abnormal results are displayed) ?Labs Reviewed  ?COMPREHENSIVE METABOLIC PANEL - Abnormal; Notable for the following components:  ?    Result Value  ? Calcium 8.0 (*)   ? Albumin 2.1 (*)   ? Alkaline Phosphatase 827 (*)   ? Total Bilirubin 1.4 (*)   ? All other components within normal limits  ?CBC WITH DIFFERENTIAL/PLATELET - Abnormal; Notable for the following components:  ? WBC 14.0 (*)   ? RBC 2.39 (*)   ? Hemoglobin 6.2 (*)   ? HCT 19.2 (*)   ? MCH 25.9 (*)   ? Neutro Abs 12.2 (*)   ? Abs Immature Granulocytes 0.15 (*)   ? All other components within normal limits  ?URINALYSIS, ROUTINE W REFLEX MICROSCOPIC - Abnormal; Notable for the following components:  ?  Color, Urine AMBER (*)   ? APPearance CLEAR (*)   ? Ketones, ur 5 (*)   ? Leukocytes,Ua SMALL (*)   ? Bacteria, UA RARE (*)   ? All other components within normal limits  ?LIPASE, BLOOD - Abnormal; Notable for the following components:  ? Lipase 67 (*)   ? All other components within normal limits  ?RESP PANEL BY RT-PCR (FLU A&B, COVID) ARPGX2  ?URINE CULTURE  ?LACTIC ACID, PLASMA  ?PREPARE RBC (CROSSMATCH)  ?ABO/RH  ?TYPE AND SCREEN  ? ? ?EKG ?Sinus tachycardia, rate of 112 bpm.  Normal axis and intervals.  No clear evidence of acute ischemia. ? ?RADIOLOGY ?CXR reviewed by me without evidence of acute cardiopulmonary pathology. ?CT abdomen/pelvis reviewed by me with evidence of biliary obstruction. ? ?Official radiology report(s): ?DG Chest 2 View ? ?Result Date: 06/28/2021 ?CLINICAL DATA:  Tachycardia tachypnea, evaluate infiltrate. EXAM: CHEST - 2 VIEW COMPARISON:  Chest radiograph dated December 08, 2020  FINDINGS: The heart size and mediastinal contours are within normal limits. Atherosclerotic calcification of the aortic arch. Hyperinflated lungs without evidence of focal consolidation or pleural effusion. The visualized skeletal structures are unremarkable. Right IJ access Port-A-Cath. IMPRESSION: No active cardiopulmonary disease. Electronically Signed   By: Keane Police D.O.   On: 06/28/2021 17:22  ? ?CT ABDOMEN PELVIS W CONTRAST ? ?Result Date: 06/28/2021 ?CLINICAL DATA:  Pancreatic cancer, increased right upper quadrant pain, evaluate for biliary obstruction, small-bowel obstruction * Tracking Code: BO * EXAM: CT ABDOMEN AND PELVIS WITH CONTRAST TECHNIQUE: Multidetector CT imaging of the abdomen and pelvis was performed using the standard protocol following bolus administration of intravenous contrast. RADIATION DOSE REDUCTION: This exam was performed according to the departmental dose-optimization program which includes automated exposure control, adjustment of the mA and/or kV according to patient size and/or use of iterative reconstruction technique. CONTRAST:  41m OMNIPAQUE IOHEXOL 300 MG/ML  SOLN COMPARISON:  CT abdomen pelvis, 05/12/2021 MR abdomen, 06/02/2021 FINDINGS: Lower chest: No acute abnormality.  Emphysema. Hepatobiliary: Redemonstrated large-bore left hepatic and common bile duct stent. There has been interval placement of an additional stent catheter within this larger stent. Severe intrahepatic biliary ductal dilatation and post stenting pneumobilia, increased compared to prior examination. Subtle, amorphous heterogeneous hypoenhancement throughout the liver parenchyma, particularly conspicuous in the right lobe (series 2, image 17). Pancreas: Unchanged hypodense mass of the pancreatic head, with atrophy and pancreatic ductal dilatation distally. Degree of pancreatic ductal dilatation has increased compared to prior examination, now measuring up to 0.9 cm in caliber (series 2, image 27). The  central portal vein is now effaced by pancreatic mass and adjacent portacaval soft tissue (series 2, image 25). Spleen: Normal in size without significant abnormality. Adrenals/Urinary Tract: Adrenal glands are unremarkable. Kidneys are normal, without renal calculi, solid lesion, or hydronephrosis. Bladder is unremarkable. Stomach/Bowel: Stomach is within normal limits. Appendix appears normal. Wall thickening and edema of the cecal base (series 2, image 61). Vascular/Lymphatic: Aortic atherosclerosis. Unchanged infrarenal abdominal aortic aneurysm with eccentric mural thrombus, measuring 3.9 x 3.5 cm in caliber. Bilateral common iliac artery stents. Newly prominent, indistinct and matted appearing retroperitoneal lymph nodes, largest retroaortic node measuring 1.1 x 1.1 cm (series 2, image 32). Reproductive: No mass or other significant abnormality. Prominent bilateral ovarian and uterine varices Other: No abdominal wall hernia or abnormality. Small volume ascites throughout the abdomen and pelvis, slightly increased compared to prior examination. Musculoskeletal: No acute or significant osseous findings. IMPRESSION: 1. Redemonstrated large-bore left hepatic and common bile duct stent.  There has been interval placement of an additional stent catheter within this larger stent. Severe intrahepatic biliary ductal dilatation and post stenting pneumobilia, increased compared to prior examination. 2. Unchanged hypodense mass of the pancreatic head, with atrophy and pancreatic ductal dilatation distally. Degree of pancreatic ductal dilatation has increased compared to prior examination, now measuring up to 0.9 cm in caliber. 3. The central portal vein is now effaced by pancreatic mass and increased, adjacent portacaval soft tissue and or matted lymph nodes. 4. Newly prominent, indistinct and matted appearing retroperitoneal lymph nodes. 5. Subtle, amorphous heterogeneous hypoenhancement throughout the liver parenchyma,  particularly conspicuous in the right lobe. This most likely reflects perfusion abnormalities in the setting of new portal vein occlusion, although could be seen in the setting of cholangitis. New hep

## 2021-06-28 NOTE — Progress Notes (Signed)
Pharmacy Antibiotic Note ? ?Shirley Barton is a 71 y.o. female admitted on 06/28/2021 with  intra-abdominal infection .  Pharmacy has been consulted for Cefepime dosing. ? ?Plan: ?Cefepime 2 gm IV Q12H ordered to start on 3/29 @ 2200.  ? ?Height: 5' 3.5" (161.3 cm) ?Weight: 42.2 kg (93 lb) ?IBW/kg (Calculated) : 53.55 ? ?Temp (24hrs), Avg:98.1 ?F (36.7 ?C), Min:97.6 ?F (36.4 ?C), Max:98.5 ?F (36.9 ?C) ? ?Recent Labs  ?Lab 06/26/21 ?0850 06/28/21 ?1333 06/28/21 ?1508  ?WBC 20.7* 19.3* 14.0*  ?CREATININE 0.69 0.64 0.54  ?LATICACIDVEN  --   --  1.5  ?  ?Estimated Creatinine Clearance: 44.2 mL/min (by C-G formula based on SCr of 0.54 mg/dL).   ? ?Allergies  ?Allergen Reactions  ? Iodinated Contrast Media Nausea Only and Other (See Comments)  ? Tramadol Other (See Comments)  ?  unknown  ? Penicillins Rash  ?  Pt states that she has been taking this medication and has had not problem  ? ? ?Antimicrobials this admission: ?  >>  ?  >>  ? ?Dose adjustments this admission: ? ? ?Microbiology results: ? BCx:  ? UCx:   ? Sputum:   ? MRSA PCR:  ? ?Thank you for allowing pharmacy to be a part of this patient?s care. ? ?Branden Vine D ?06/28/2021 10:03 PM ? ?

## 2021-06-28 NOTE — ED Notes (Signed)
Blood transfusion started. Consent obtained. All questions answered. Pt remains connected to all monitors. Double verified by 2 RNs. This Rn remains in room with pt for first 15 minutes.  ?

## 2021-06-28 NOTE — H&P (Signed)
?History and Physical  ? ? ?Patient: Shirley Barton HER:[id number] DOB: 02-16-1951 ?DOA: 06/28/2021 ?DOS: the patient was seen and examined on 06/28/2021 ?PCP: Erma Heritage, MD  ?Patient coming from: Home ? ?Chief Complaint:  ?Chief Complaint  ?Patient presents with  ? Abnormal Lab  ? ? ?HPI: Shirley Barton is a 71 y.o. 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  Right upper quadrant pain about the same.  He has no nausea, vomiting or diarrhea, cough or shortness of breath. ?ED course: On arrival afebrile, tachycardic to 114, tachypneic to 23 with initially normal BP with subsequently went as low as 103/57 just prior to admission.  Blood work reveals WBC of 14,000 with lactic acid 1.5 hemoglobin 6.2 down from 7.4 on 3/27.  Lipase 67, with alk phos 827, up from 598 on 3/27 and total bilirubin 1.4 up from 1.0 on 3/27.  BUN/creatinine WNL urinalysis with 5 ketones small leukocytes negative nitrites and rare bacteria ?CT abdomen and pelvis showed the following ? ?The emergency room provider spoke with a biliary specialist at [facility], Dr. Jola Schmidt, at the recommendation of local GI, Dr. Alice Reichert to request transfer however there was no bed availability.  Dr. Alice Reichert subsequently agreed to consult with Dr. Allen Norris for placement of biliary stent here at this facility.  If unsuccessful, will reattempt transfer.  Hospitalist thus consulted for admission.  ? ?Review of Systems: As  mentioned in the history of present illness. All other systems reviewed and are negative. ?Past Medical History:  ?Diagnosis Date  ? Abdominal pain, generalized   ? Anxiety   ? Atypical chest pain   ? Back pain   ? Basal cell carcinoma of skin 2013  ? resected from Left scalp area.   ? COPD (chronic obstructive pulmonary disease) ([place])   ? Depression   ? DOE (dyspnea on exertion)   ? GERD (gastroesophageal reflux disease)   ? Hypertension   ? Pancreatic cancer ([place])   ? Shortness of breath dyspnea   ? ?Past Surgical History:  ?Procedure Laterality Date  ? BASAL CELL CARCINOMA EXCISION  2013  ? CARPAL TUNNEL RELEASE Left 90s  ? CHOLECYSTECTOMY N/A 09/16/2015  ? Procedure: LAPAROSCOPIC CHOLECYSTECTOMY WITH INTRAOPERATIVE CHOLANGIOGRAM;  Surgeon: Robert Bellow, MD;  Location: [facility] ORS;  Service: General;  Laterality: N/A;  ? COLONOSCOPY  1990"s  ? [place]  ? ENDOSCOPIC RETROGRADE CHOLANGIOPANCREATOGRAPHY (ERCP) WITH PROPOFOL N/A 11/10/2020  ? Procedure: ENDOSCOPIC RETROGRADE CHOLANGIOPANCREATOGRAPHY (ERCP) WITH PROPOFOL;  Surgeon: Lucilla Lame, MD;  Location: [facility] ENDOSCOPY;  Service: Endoscopy;  Laterality: N/A;  ? ERCP N/A 08/05/2020  ? Procedure: ENDOSCOPIC RETROGRADE CHOLANGIOPANCREATOGRAPHY (ERCP);  Surgeon: Lucilla Lame, MD;  [facility] ENDOSCOPY;  Service: Endoscopy;  Laterality: N/A;  ? ERCP N/A 06/06/2021  ? Procedure: ENDOSCOPIC RETROGRADE CHOLANGIOPANCREATOGRAPHY (ERCP);  Surgeon: Lucilla Lame, MD;  Location: [facility] ENDOSCOPY;  Service: Endoscopy;  Laterality: N/A;  ? ESOPHAGOGASTRODUODENOSCOPY (EGD) WITH PROPOFOL N/A 02/05/2017  ? Procedure: ESOPHAGOGASTRODUODENOSCOPY (EGD) WITH PROPOFOL;  Surgeon: Lollie Sails, MD;  Location:  [place] ENDOSCOPY;  Service: Endoscopy;  Laterality: N/A;  ? PERIPHERAL VASCULAR CATHETERIZATION Bilateral 12/27/2015  ? Procedure: Lower Extremity Angiography;  Surgeon: Katha Cabal, MD;  Location: [place] CV LAB;  Service: Cardiovascular;  Laterality: Bilateral;   ? PORTA CATH INSERTION N/A 09/08/2020  ? Procedure: PORTA CATH INSERTION;  Surgeon: Algernon Huxley, MD;  Location: [place] CV LAB;  Service: Cardiovascular;  Laterality: N/A;  ? TONSILLECTOMY    ? ?Social History:  reports that she quit smoking about 9 years ago. Her smoking use included cigarettes. She has never used smokeless tobacco. She reports that she does not drink alcohol and does not use drugs. ? ?Allergies  ?Allergen Reactions  ? Iodinated Contrast Media Nausea Only and Other (See Comments)  ? Tramadol Other (See Comments)  ?  unknown  ? Penicillins Rash  ?  Pt states that she has been taking this medication and has had not problem  ? ? ?Family History  ?Problem Relation Age of Onset  ? Cerebral palsy Daughter   ? Colon polyps Sister   ? Leukemia Daughter   ? Depression Son   ? Breast cancer Neg Hx   ? ? ?Prior to Admission medications   ?Medication Sig Start Date End Date Taking? Authorizing Provider  ?acetaminophen (TYLENOL) 325 MG tablet Take 650 mg by mouth every 6 (six) hours as needed for mild pain or moderate pain.   Yes [provider]  ?B Complex-C-Folic Acid (B COMPLEX-VITAMIN C-FOLIC ACID) 1 MG tablet Take 1 tablet by mouth daily with breakfast. 08/07/20  Yes Fritzi Mandes, MD  ?Fluticasone-Salmeterol (ADVAIR) 100-50 MCG/DOSE AEPB Inhale 1 puff into the lungs 2 (two) times daily.   Yes [provider]  ?HYDROcodone-acetaminophen (NORCO) 5-325 MG tablet Take 1 tablet by mouth every 12 (twelve) hours as needed for moderate pain. 05/17/21  Yes Cammie Sickle, MD  ?KLOR-CON M20 20 MEQ tablet TAKE 1 TABLET BY MOUTH TWICE A DAY 12/07/20  Yes Cammie Sickle, MD  ?levalbuterol [facility] HFA) 45 MCG/ACT inhaler Inhale 1-2 puffs into the lungs every 6 (six) hours as needed for wheezing or shortness of breath. 12/15/20  Yes Borders, Kirt Boys, NP  ?levofloxacin (LEVAQUIN) 500 MG tablet Take 1 tablet (500 mg total) by mouth daily. 06/26/21  Yes Sindy Guadeloupe, MD   ?lidocaine-prilocaine (EMLA) cream Apply 30 -45 mins prior to port access. 09/08/20  Yes Cammie Sickle, MD  ?megestrol (MEGACE ES) 625 MG/5ML suspension Take 5 mLs (625 mg total) by mouth daily. 06/13/21  Yes Cammie Sickle, MD  ?pantoprazole (PROTONIX) 40 MG tablet TAKE 1 TABLET BY MOUTH EVERY DAY 02/27/21  Yes Cammie Sickle, MD  ?ALPRAZolam Duanne Moron) 0.25 MG tablet Take 1 tablet (0.25 mg total) by mouth every 12 (twelve) hours as needed for anxiety. ?Patient not taking: Reported on 06/13/2021 02/13/21   Cammie Sickle, MD  ?apixaban (ELIQUIS) 5 MG TABS tablet Take 1 tablet (5 mg total) by mouth 2 (two) times daily. ?Patient not taking: Reported on 06/28/2021 06/06/21   Cammie Sickle, MD  ?diphenoxylate-atropine (LOMOTIL) 2.5-0.025 MG tablet Take 1 tablet by mouth 4 (four) times daily as needed for diarrhea or loose stools. ?Patient not taking: Reported on 06/06/2021 10/10/20   Cammie Sickle, MD  ?hydrOXYzine (ATARAX) 25 MG tablet TAKE 1 TABLET BY MOUTH 3 TIMES DAILY AS NEEDED FOR ITCHING. ?Patient not taking: Reported on 06/13/2021 05/11/21   Cammie Sickle, MD  ?loratadine (CLARITIN) 10 MG tablet Take  10 mg by mouth daily. ?Patient not taking: Reported on 06/28/2021    [provider]  ?magic mouthwash w/lidocaine SOLN Take 10 mLs by mouth every 2 (two) hours as needed for mouth pain. ?Patient not taking: Reported on 04/18/2021 09/16/20   Borders, Kirt Boys, NP  ?montelukast (SINGULAIR) 10 MG tablet Take 1 tablet (10 mg total) by mouth at bedtime. ?Patient not taking: Reported on 06/13/2021 05/15/21   Cammie Sickle, MD  ?ondansetron (ZOFRAN) 8 MG tablet One pill every 8 hours as needed for nausea/vomitting. 10/17/20   Cammie Sickle, MD  ?prochlorperazine (COMPAZINE) 10 MG tablet Take 1 tablet (10 mg total) by mouth every 6 (six) hours as needed for nausea or vomiting. ?Patient not taking: Reported on 06/28/2021 10/17/20   Cammie Sickle, MD   ? ? ?Physical Exam: ?Vitals:  ? 06/28/21 2030 06/28/21 2033 06/28/21 2045 06/28/21 2100  ?BP: 115/69 115/69 108/72 (!) 103/57  ?Pulse: 96  99 (!) 101  ?Resp:  16 18   ?Temp:  98.2 ?F (36.8 ?C) 98.2 ?F (36.8 ?C)   ?TempSrc:  Oral

## 2021-06-28 NOTE — ED Notes (Signed)
Blood transfusion completed. Pt tolerated well. No signs of suspected reaction. ?

## 2021-06-28 NOTE — ED Notes (Signed)
Patient at xray

## 2021-06-28 NOTE — ED Notes (Signed)
Pt resting comfortably in bed at this time. Pt is alert and oriented with even and regular respirations. No acute distress noted. Pt denies any needs at this time. Call light within reach.  °

## 2021-06-29 DIAGNOSIS — C801 Malignant (primary) neoplasm, unspecified: Secondary | ICD-10-CM | POA: Diagnosis not present

## 2021-06-29 DIAGNOSIS — K831 Obstruction of bile duct: Secondary | ICD-10-CM | POA: Diagnosis not present

## 2021-06-29 LAB — TYPE AND SCREEN
ABO/RH(D): O POS
Antibody Screen: NEGATIVE
Unit division: 0

## 2021-06-29 LAB — CBC
HCT: 22.7 % — ABNORMAL LOW (ref 36.0–46.0)
Hemoglobin: 7.4 g/dL — ABNORMAL LOW (ref 12.0–15.0)
MCH: 26.9 pg (ref 26.0–34.0)
MCHC: 32.6 g/dL (ref 30.0–36.0)
MCV: 82.5 fL (ref 80.0–100.0)
Platelets: 278 10*3/uL (ref 150–400)
RBC: 2.75 MIL/uL — ABNORMAL LOW (ref 3.87–5.11)
RDW: 15.9 % — ABNORMAL HIGH (ref 11.5–15.5)
WBC: 9.3 10*3/uL (ref 4.0–10.5)
nRBC: 0 % (ref 0.0–0.2)

## 2021-06-29 LAB — COMPREHENSIVE METABOLIC PANEL
ALT: 18 U/L (ref 0–44)
AST: 31 U/L (ref 15–41)
Albumin: 1.9 g/dL — ABNORMAL LOW (ref 3.5–5.0)
Alkaline Phosphatase: 878 U/L — ABNORMAL HIGH (ref 38–126)
Anion gap: 6 (ref 5–15)
BUN: 11 mg/dL (ref 8–23)
CO2: 25 mmol/L (ref 22–32)
Calcium: 7.8 mg/dL — ABNORMAL LOW (ref 8.9–10.3)
Chloride: 105 mmol/L (ref 98–111)
Creatinine, Ser: 0.55 mg/dL (ref 0.44–1.00)
GFR, Estimated: >60 mL/min (ref >60)
Glucose, Bld: 130 mg/dL — ABNORMAL HIGH (ref 70–99)
Potassium: 3.6 mmol/L (ref 3.5–5.1)
Sodium: 136 mmol/L (ref 135–145)
Total Bilirubin: 1.8 mg/dL — ABNORMAL HIGH (ref 0.3–1.2)
Total Protein: 5.9 g/dL — ABNORMAL LOW (ref 6.5–8.1)

## 2021-06-29 LAB — PROTIME-INR
INR: 1.2 (ref 0.8–1.2)
Prothrombin Time: 15.5 seconds — ABNORMAL HIGH (ref 11.4–15.2)

## 2021-06-29 LAB — CORTISOL-AM, BLOOD: Cortisol - AM: 5.4 ug/dL — ABNORMAL LOW (ref 6.7–22.6)

## 2021-06-29 LAB — BPAM RBC
Blood Product Expiration Date: 202304292359
ISSUE DATE / TIME: 202303292022
Unit Type and Rh: 5100

## 2021-06-29 LAB — PROCALCITONIN: Procalcitonin: 0.99 ng/mL

## 2021-06-29 MED ORDER — MEGESTROL ACETATE 400 MG/10ML PO SUSP
625.0000 mg | Freq: Every day | ORAL | Status: DC
  Administered 2021-06-29 – 2021-07-05 (×7): 625 mg via ORAL
  Filled 2021-06-29 (×7): qty 20

## 2021-06-29 MED ORDER — MOMETASONE FURO-FORMOTEROL FUM 100-5 MCG/ACT IN AERO
2.0000 | INHALATION_SPRAY | Freq: Two times a day (BID) | RESPIRATORY_TRACT | Status: DC
  Administered 2021-06-30 – 2021-07-05 (×11): 2 via RESPIRATORY_TRACT
  Filled 2021-06-29: qty 8.8

## 2021-06-29 MED ORDER — CHLORHEXIDINE GLUCONATE CLOTH 2 % EX PADS
6.0000 | MEDICATED_PAD | Freq: Every day | CUTANEOUS | Status: DC
  Administered 2021-06-30 – 2021-07-05 (×6): 6 via TOPICAL

## 2021-06-29 MED ORDER — ADULT MULTIVITAMIN W/MINERALS CH
1.0000 | ORAL_TABLET | Freq: Every day | ORAL | Status: DC
  Administered 2021-06-29 – 2021-07-05 (×6): 1 via ORAL
  Filled 2021-06-29 (×6): qty 1

## 2021-06-29 MED ORDER — PANTOPRAZOLE SODIUM 40 MG PO TBEC
40.0000 mg | DELAYED_RELEASE_TABLET | Freq: Every day | ORAL | Status: DC
  Administered 2021-06-29: 40 mg via ORAL
  Filled 2021-06-29: qty 1

## 2021-06-29 MED ORDER — HYDROMORPHONE HCL 1 MG/ML IJ SOLN
0.5000 mg | INTRAMUSCULAR | Status: DC | PRN
  Administered 2021-06-29 – 2021-07-05 (×20): 0.5 mg via INTRAVENOUS
  Filled 2021-06-29 (×21): qty 1

## 2021-06-29 MED ORDER — PROSOURCE PLUS PO LIQD
30.0000 mL | Freq: Three times a day (TID) | ORAL | Status: DC
  Administered 2021-06-29 – 2021-07-02 (×8): 30 mL via ORAL
  Filled 2021-06-29 (×20): qty 30

## 2021-06-29 NOTE — Progress Notes (Addendum)
?PROGRESS NOTE ? ? ? ?Shirley Barton  ZYY:[id number] DOB: 07-25-1950 DOA: 06/28/2021 ?PCP: Erma Heritage, MD  ?Outpatient Specialists: oncology ? ? ? ?Brief Narrative:  ? ?From admission h and p ?Shirley Barton is a 71 y.o. 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  Right upper quadrant pain about the same.  He has no nausea, vomiting or diarrhea, cough or shortness of breath. ?ED course: On arrival afebrile, tachycardic to 114, tachypneic to 23 with initially normal BP with subsequently went as low as 103/57 just prior to admission.  Blood work reveals WBC of 14,000 with lactic acid 1.5 hemoglobin 6.2 down from 7.4 on 3/27.  Lipase 67, with alk phos 827, up from 598 on 3/27 and total bilirubin 1.4 up from 1.0 on 3/27.  BUN/creatinine WNL urinalysis with 5 ketones small leukocytes negative nitrites and rare bacteria ?CT abdomen and pelvis showed the following ?  ?The emergency room provider spoke with a biliary specialist at [facility], Dr. Jola Schmidt, at the recommendation of local GI, Dr. Alice Reichert to request transfer however there was no bed availability.  Dr. Alice Reichert subsequently agreed to consult with Dr. Allen Norris for placement of biliary stent here at this facility.  If unsuccessful, will reattempt transfer.  Hospitalist thus consulted for admission.  ? ?Assessment & Plan: ?  ?Principal Problem: ?  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  Sepsis ([place]) ?   Malignant neoplasm of head of pancreas ([place]) ?  Chronic obstructive pulmonary disease (H[facility]) ?  AAA (abdominal aortic aneurysm) ? ?# Biliary obstruction with cholangitis ?Temp with oncology, failed outpt levaquin, now with worsening ruq pain, leukocytosis, CT with intrahepatic bile duct dilatation worse than prior. Has been stented several times already. Hemodynamically stable. Initial ?- cont cefepime/flagyl ?- gi consulted, plan is ERCP ?- npo, continue IVF ?- morphine prn, zofran prn ? ?# Sepsis ?By HR, leukocytosis. Normal lactate ?- fluids, abx ?- f/u 3/27 blood cultures and repeat set ordered this morning ? ?# Anemia ?Hgb 6.2 on arrival, no report of bleeding, received 1 unit, improved to 7.4 this morning ?- will monitor closely for now, GI to see ? ?# Pancreatic cancer ?- oncology notified ? ?# AAA  ?With mural thrombus ?- no longer taking apixaban ? ?# HTN ?Bp wnl ?- hold home antihypertensives for now ? ?# COPD ?Not exacerbated ?- home dulera ? ?# Malnutrition ?- cont home megace ?- rd consult ? ?# PAD ?S/p iliac stents.  ? ?DVT prophylaxis: SCDs for now pending procedure ?Code Status: full ?Family Communication: son [name] updated telephonically 3/30 ? ?Level of care: Med-Surg ?Status is: Inpatient ?Remains inpatient appropriate because: severity of illness ? ? ? ?Consultants:  ?GI ? ?Procedures: ?none ? ?Antimicrobials:  ?Cefepime/flagyl  ? ? ?Subjective: ?Ongoing ruq pain. Nausea, no vomiting. No diarrhea. No chest pain or cough ? ?Objective: ?Vitals:  ? 06/29/21 0230 06/29/21 0300 06/29/21 0600 06/29/21 0630  ?BP: 121/75 114/73 132/79 136/71  ?Pulse: 91 92 89 89  ?Resp:  17 (!) 23 (!) 24  ?Temp:      ?TempSrc:      ?SpO2: 99% 98% 99% 100%  ?Weight:      ?Height:      ? ? ?Intake/Output Summary (Last 24 hours) at 06/29/2021 0846 ?Last data filed at 06/29/2021 0115 ?Gross per 24 hour  ?Intake 2481.18 ml  ?Output --  ?Net 2481.18 ml  ? ?Filed Weights  ? 06/28/21 1509  ?Weight: 42.2 kg   ? ? ?Examination: ? ?General exam: Appears calm and comfortable, chronically ill appearing ?Respiratory system: Clear to auscultation. Respiratory effort normal. ?Cardiovascular system: S1 & S2 heard, RRR. No JVD, murmurs, rubs, gallops or clicks. No pedal edema. ?Gastrointestinal system: Abdomen is moderately distended, ttp throughout greatest ruq, with guarding.  ?Central nervous system: Alert and oriented. No focal neurological deficits. ?Extremities: Symmetric 5 x 5 power. ?Skin: No rashes, lesions or ulcers ?Psychiatry: Judgement and insight appear normal. Mood & affect appropriate.  ? ? ? ?Data Reviewed: I have personally reviewed following labs and imaging studies ? ?CBC: ?Recent Labs  ?Lab 06/26/21 ?0850 06/28/21 ?1333 06/28/21 ?1508 06/29/21 ?0448  ?WBC 20.7* 19.3* 14.0* 9.3  ?NEUTROABS 17.9* 16.7* 12.2*  --   ?HGB 7.4* 7.1* 6.2* 7.4*  ?HCT 22.4* 21.9* 19.2* 22.7*  ?MCV 80.0 79.3* 80.3 82.5  ?PLT 374 513* 382 278  ? ?Basic Metabolic Panel: ?Recent Labs  ?Lab 06/26/21 ?0850 06/28/21 ?1333 06/28/21 ?1508 06/29/21 ?0448  ?NA 134* 134* 136 136  ?K 3.6 3.5 3.6 3.6  ?CL 102 101 106 105  ?CO2 _0 ?GLUCOSE 154* 116* 96 130*  ?BUN _1 ?CREATININE 0.69 0.64 0.54 0.55  ?CALCIUM 8.6* 8.7* 8.0* 7.8*  ? ?GFR: ?Estimated Creatinine Clearance: 44.2 mL/min (by C-G formula based on SCr of 0.55 mg/dL). ?Liver Function Tests: ?Recent Labs  ?Lab 06/26/21 ?0850 06/28/21 ?1333 06/28/21 ?1508 06/29/21 ?0448  ?AST 28 32 27 31  ?ALT _2 ?ALKPHOS 598* 924* 827* 878*  ?BILITOT 1.0 1.3* 1.4* 1.8*  ?PROT 7.4 7.2 6.6 5.9*  ?ALBUMIN 2.4* 2.4* 2.1* 1.9*  ? ?Recent Labs  ?Lab 06/28/21 ?9233  ?LIPASE 67*  ? ?No results for input(s): AMMONIA in the last 168 hours. ?Coagulation Profile: ?Recent Labs  ?Lab 06/29/21 ?0448  ?INR 1.2  ? ?Cardiac Enzymes: ?No results for input(s): CKTOTAL, CKMB, CKMBINDEX, TROPONINI in the last 168 hours. ?BNP (last 3 results) ?No results for input(s): PROBNP in the last 8760  hours. ?HbA1C: ?No results for input(s): HGBA1C in the last 72 hours. ?CBG: ?No results for input(s): GLUCAP in the last 168 hours. ?Lipid Profile: ?No results for input(s): CHOL, HDL, LDLCALC, TRIG, CHOLHDL, LDLDIRECT in the last 72 hours. ?Thyroid Function Tests: ?No results for input(s): TSH, T4TOTAL, FREET4, T3FREE, THYROIDAB in the last 72 hours. ?Anemia Panel: ?Recent Labs  ?  06/26/21 ?1018  ?FERRITIN 128  ?TIBC 181*  ?IRON 16*  ? ?Urine analysis: ?   ?Component Value Date/Time  ? [place] (A) 06/28/2021 1735  ? APPEARANCEUR CLEAR (A) 06/28/2021 1735  ? APPEARANCEUR Clear 10/06/2015 1406  ? LABSPEC 1.020 06/28/2021 1735  ? PHURINE 5.0 06/28/2021 1735  ? GLUCOSEU NEGATIVE 06/28/2021 1735  ? [place] NEGATIVE 06/28/2021 1735  ? [place] NEGATIVE 06/28/2021 1735  ? BILIRUBINUR Negative 10/06/2015 1406  ? KETONESUR 5 (A) 06/28/2021 1735  ? PROTEINUR NEGATIVE 06/28/2021 1735  ? UROBILINOGEN 0.2 04/10/2012 0946  ? NITRITE NEGATIVE 06/28/2021 1735  ? LEUKOCYTESUR SMALL (A)  06/28/2021 1735  ? ?Sepsis Labs: ?_0 (procalcitonin:4,lacticidven:4) ? ?) ?Recent Results (from the past 240 hour(s))  ?Resp Panel by RT-PCR (Flu A&B, Covid) Nasopharyngeal Swab     Status: None  ? Collection Time: 06/28/21  3:08 PM  ? Specimen: Nasopharyngeal Swab; Nasopharyngeal(NP) swabs in vial transport medium  ?Result Value Ref Range Status  ? SARS Coronavirus 2 by RT PCR NEGATIVE NEGATIVE Final  ?  Comment: (NOTE) ?SARS-CoV-2 target nucleic acids are NOT DETECTED. ? ?The SARS-CoV-2 RNA is generally detectable in upper respiratory ?specimens during the acute phase of infection. The lowest ?concentration of SARS-CoV-2 viral copies this assay can detect is ?138 copies/mL. A negative result does not preclude SARS-Cov-2 ?infection and should not be used as the sole basis for treatment or ?other patient management decisions. A negative result may occur with  ?improper specimen collection/handling, submission of specimen other ?than  nasopharyngeal swab, presence of viral mutation(s) within the ?areas targeted by this assay, and inadequate number of viral ?copies(<138 copies/mL). A negative result must be combined with ?clinical observations, patient history, and

## 2021-06-29 NOTE — ED Notes (Signed)
Dr. Duncan at bedside 

## 2021-06-29 NOTE — Progress Notes (Signed)
Admission profile updated. ?

## 2021-06-29 NOTE — Progress Notes (Signed)
Initial Nutrition Assessment ? ?DOCUMENTATION CODES:  ? ?Underweight, Non-severe (moderate) malnutrition in context of chronic illness ? ?INTERVENTION:  ? ?-30 ml Prosource Plus TID, each supplement provides 100 kcals and 15 grams protein ?-MVI with minerals daily ? ?NUTRITION DIAGNOSIS:  ? ?Moderate Malnutrition related to chronic illness (pancreatic cancer) as evidenced by mild fat depletion, mild muscle depletion, percent weight loss. ? ?GOAL:  ? ?Patient will meet greater than or equal to 90% of their needs ? ?MONITOR:  ? ?PO intake, Supplement acceptance, Diet advancement, Labs, Weight trends, Skin, I & O's ? ?REASON FOR ASSESSMENT:  ? ?Malnutrition Screening Tool ?  ? ?ASSESSMENT:  ? ?Shirley Barton is a 72 y.o. 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  Right upper quadrant pain about the same.  He has no nausea, vomiting or diarrhea, cough or shortness of breath. ? ?Pt admitted with cholangitis with biliary obstruction.  ? ?Reviewed I/O's: +2.5 L x 24 hours ? ?Per GI notes, plan for ERCP tomorrow.  ? ?Spoke with pt at bedside, who reports variable appetite since cancer diagnosis. She reports she has good days and bad days, but has not been able to eat much this week, as food is often unappealing to her. She shares that she has tried multiple supplements, including Boost Breeze, Ensure, and a 500 calorie supplement her son purchased, all of which she didn't  like secondary to texture ("too thick"). She consumed some potato soup without difficulty for lunch, but was unable to eat much as she was moved to a different room.  ? ?Pt shares that weight fluctuates, usually ranges between 93-102#. She shares she has always been small framed. Reviewed wt hx; pt has experienced a 8.9% wt loss over the past 2 months, which is significant for time frame.  ? ?Per pt, she was supposed to start cancer treatments this week, however, these have been postponed due to acute illness. RD provided emotional support and reflective listening. Pt shares she is very appreciative of hospital staff.  ? ?Discussed importance of good meal and supplement intake to promote healing. Reviewed available supplements; she is amenable to Prosource. Pt hopeful that intake will improve after procedure.  ?  ?Medications reviewed and include megace and lactated ringers infusion @ 100 ml/hr.  ? ?Labs reviewed.  ? ?NUTRITION - FOCUSED PHYSICAL EXAM: ? ?Flowsheet Row Most Recent Value  ?Orbital Region No depletion  ?Upper Arm Region Mild depletion  ?Thoracic and Lumbar Region No depletion  ?Buccal Region Mild depletion  ?Temple Region Mild depletion  ?Clavicle Bone Region Mild depletion  ?Clavicle and Acromion Bone Region Mild depletion  ?Scapular Bone Region Mild depletion  ?Dorsal Hand Mild depletion  ?Patellar Region Mild depletion  ?Anterior Thigh Region Mild depletion  ?Posterior Calf Region Mild depletion  ?Edema (RD Assessment) None  ?Hair Reviewed  ?Eyes Reviewed  ?Mouth Reviewed  ?Skin Reviewed  ?Nails Reviewed  ? ?  ? ? ?Diet Order:   ?Diet Order   ? ?       ?  Diet NPO time specified  Diet effective midnight       ?  ?  Diet full liquid Room service appropriate? Yes; Fluid consistency: Thin  Diet effective now       ?  ? ?  ?  ? ?  ? ? ?EDUCATION NEEDS:  ? ?Education needs have been addressed ? ?Skin:  Skin Assessment: Reviewed RN Assessment ? ?Last BM:  Unknown ? ?Height:  ? ?Ht Readings from Last 1  Encounters:  ?06/28/21 5' 3.5" (1.613 m)  ? ? ?Weight:  ? ?Wt Readings from Last 1 Encounters:  ?06/28/21 42.2 kg  ? ? ?Ideal Body Weight:  53.4 kg ? ?BMI:  Body mass index is 16.22 kg/m?. ? ?Estimated Nutritional Needs:  ? ?Kcal:  1300-1500 ? ?Protein:  70-85 grams ? ?Fluid:  > 1.3 L ? ? ? ?Loistine Chance, RD, LDN, CDCES ?Registered Dietitian II ?Certified Diabetes Care and Education Specialist ?Please refer to [facility] for RD and/or RD on-call/weekend/after hours pager  ?

## 2021-06-29 NOTE — Assessment & Plan Note (Addendum)
-   Being followed up with oncology as an outpatient. ?

## 2021-06-29 NOTE — ED Notes (Signed)
Pt helped up to use bathroom  ?

## 2021-06-29 NOTE — Evaluation (Signed)
Physical Therapy Evaluation ?Patient Details ?Name: Shirley Barton ?MRN: [id number] ?DOB: 29-Oct-1950 ?Today's Date: 06/29/2021 ? ?History of Present Illness ? Shirley Barton is a 71 y.o. 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  Right upper quadrant pain about the same.  He has no nausea, vomiting or diarrhea, cough or shortness of breath.     Pt admitted with cholangitis with biliary obstruction. [name] is a 71 y.o. 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  Right upper quadrant pain about the same.  He has no nausea, vomiting or diarrhea, cough or shortness of breath. Pt admitted with cholangitis with biliary obstruction.  ?Clinical Impression ?  ?PT arrived and patient was in [place] position in hospital bed.  Patient agreeable for physical therapy evaluation at this time.  Patient able to independently take  covers off of her legs and set up with some assistance from hospital bed rails.  Patient able to sit independently for several minutes as physical therapist asked her questions regarding her home layout.  Patient able to perform sit to stand independently without use of upper extremities is able to stand and march without upper extremity support.  Patient able to ambulate 125 feet in hospital room performing several laps without assistive device and without loss of balance.  Patient does have steps to enter her home and reports her son is usually there to help her with this.  Patient would benefit from practice with stairs with min assist to supervision level assist in order to ensure safety returning home. Upon returning home patient will need assistance with getting in and out of her home and reports her son should be able to assist with this task. Patient may benefit from home health therapy upon returning home in order to further improve her strength and independence with accessing her home.  Patient left in bed with call bell, phone, and all needs within reach.  Bed alarm was on upon physical therapist leaving room.     ? ?Recommendations for follow up therapy are one component of a multi-disciplinary discharge planning process, led by the attending physician.  Recommendations may be updated based on patient status, additional functional criteria and insurance authorization. ? ?Follow Up Recommendations Home health PT ? ?  ?Assistance Recommended at Discharge Intermittent Supervision/Assistance  ?Patient can return home with the following ? Assistance with cooking/housework;Assist for transportation;Help with stairs or ramp for entrance ? ?  ?Equipment Recommendations    ?Recommendations for Other Services ?    ?  ?  Functional Status Assessment Patient has had a recent decline in their functional status and demonstrates the ability to make significant improvements in function in a reasonable and predictable  amount of time.  ? ?  ?Precautions / Restrictions Precautions ?Precautions: None ?Precaution Comments: Good balance with ambulation around room with no LOB or fatigue. ?Restrictions ?Weight Bearing Restrictions: No  ? ?  ? ?Mobility ? Bed Mobility ?Overal bed mobility: Modified Independent ?  ?  ?  ?  ?  ?  ?General bed mobility comments: used handrails for bed mobility but no A from PT. ?  ? ?Transfers ?Overall transfer level: Independent ?  ?  ?  ?  ?  ?  ?  ?  ?General transfer comment: Able to perform STS transfer and ambulation wihtout AD and with LOB ?  ? ?Ambulation/Gait ?Ambulation/Gait assistance: Independent ?Gait Distance (Feet): 125 Feet ?Assistive device: None ?Gait Pattern/deviations: Decreased step length - left, Decreased step length - right ?  ?  ?  ?General Gait Details: small steps, good speed overall although not tested officially. Able to ambulate adn make turns without AD and with good balance. ? ?Stairs ?  ?  ?  ?  ?  ? ?Wheelchair Mobility ?  ? ?Modified Rankin (Stroke Patients Only) ?  ? ?  ? ?Balance   ?  ?  ?  ?  ?  ?  ?  ?  ?  ?  ?  ?  ?  ?  ?  ?  ?  ?  ?   ? ? ? ?Pertinent Vitals/Pain Pain Assessment ?Pain Assessment: 0-10 ?Pain Score: 7  ?Pain Location: abdomen  ? ? ?Home Living Family/patient expects to be discharged to:: Private residence ?Living Arrangements: Alone ?  ?Type of Home: House ?Home Access: Stairs to enter ?Entrance Stairs-Rails: Left;Right ?Entrance Stairs-Number of Steps: 5 STS with rails Bilateral ?  ?Home Layout: One level ?Home Equipment: None ?   ?  ?Prior Function Prior Level of Function : Independent/Modified Independent ?  ?  ?  ?  ?  ?  ?  ?  ?  ? ? ?Hand Dominance  ?   ? ?  ?Extremity/Trunk Assessment  ? Upper Extremity Assessment ?Upper Extremity Assessment: Defer to OT evaluation ?  ? ?Lower Extremity Assessment ?Lower Extremity Assessment: Overall WFL for tasks assessed ?  ? ?Cervical / Trunk Assessment ?Cervical / Trunk Assessment: Normal  ?Communication  ?  Communication: No difficulties  ?Cognition Arousal/Alertness: Awake/alert ?Behavior During Therapy: [facility] for tasks assessed/performed ?Overall Cognitive Status: Within Functional Limits for tasks assessed ?  ?  ?  ?  ?  ?  ?  ?  ?  ?  ?  ?  ?  ?  ?  ?  ?  ?  ?  ? ?  ?General Comments   ? ?  ?Exercises Other Exercises ?Other Exercises: Marching x 10 ea no UE support  ? ?Assessment/Plan  ?  ?PT Assessment Patient needs continued PT services  ?PT Problem List Decreased strength;Decreased activity tolerance ? ?   ?  ?PT Treatment Interventions Gait training;Stair training;Therapeutic exercise;Therapeutic activities;Balance training;Patient/family education;Neuromuscular re-education   ? ?PT Goals (Current goals can be found in the Care Plan section)  ?Acute Rehab PT Goals ?Patient Stated Goal: Go home ?PT Goal Formulation: With patient ?Time For Goal Achievement: 07/13/21 ?Potential to Achieve Goals: Good ? ?  ?Frequency Min 2X/week ?  ? ? ?Co-evaluation   ?  ?  ?  ?  ? ? ?  ?  AM-PAC PT "6 Clicks" Mobility  ?Outcome Measure Help needed turning from your back to your side while in a flat bed without using bedrails?: A Little ?Help needed moving from lying on your back to sitting on the side of a flat bed without using bedrails?: None ?Help needed moving to and from a bed to a chair (including a wheelchair)?: A Lot ?Help needed standing up from a chair using your arms (e.g., wheelchair or bedside chair)?: A Little ?Help needed to walk in hospital room?: A Little ?Help needed climbing 3-5 steps with a railing? : A Lot ?6 Click Score: 17 ? ?  ?End of Session Equipment Utilized During Treatment: Gait belt ?Activity Tolerance: Patient tolerated treatment well ?Patient left: in bed;with call bell/phone within reach;with bed alarm set ?  ?PT Visit Diagnosis: Unsteadiness on feet (R26.81);Muscle weakness (generalized) (M62.81) ?  ? ?Time: 2595-6387 ?PT Time Calculation (min) (ACUTE ONLY): 29 min ? ? ?Charges:   PT Evaluation ?$PT  Eval Low Complexity: 1 Low ?PT Treatments ?$Therapeutic Exercise: 8-22 mins ?  ?   ? ? ?Rivka Barbara PT, DPT  ? ? ?Particia Lather ?06/29/2021, 4:38 PM ? ?

## 2021-06-29 NOTE — Consult Note (Signed)
? ? ?GI Inpatient Consult Note ? ?Reason for Consult: Cholangitis with biliary obstruction  ?  ?Attending Requesting Consult: Dr. Vladimir Crofts ? ?History of Present Illness: ?Shirley Barton is a 71 y.o. female seen for evaluation of cholangitis at the request of ED physician - Dr. Vladimir Crofts. Patient has a PMH of anxiety, depression, COPD, GERD, HTN, AAA with mural thrombus, pancreatic cancer diagnosed 07/2020 s/p biliary stent placements 07/2020, 10/2020, and recently 06/07/21 s/p palliative radiation completed 01/2021. She presented to the [facility] ED yesterday afternoon at the request of her oncology team after being seen by them for low-grade fevers, fatigue, nausea, malaise, poor appetite, RUQ abdominal pain, and failure to respond to outpatient antibiotic therapy for concerns of sepsis of unknown etiology. She was seen by Dr. Janese Banks in office 3/27 this week for concerns of disease progression based on recent scans and had labs showing worsening leukocytosis (WBC 20.7K) with increased neutrophils, hypotension, and tachycardia. She was given 1L IV fluids in office and was started on Levaquin for presumed sepsis. Symptoms did not improve and actually got worse so she was instructed to come to the ED for admission. Upon presentation to the ED, she was tachycardic with HR 114 and otherwise normal vital signs. Labs were significant for WBC 14K, hemoglobin 7.1, MCV 79, normal serum electrolytes, BUN 15, serum creatinine 0.54, alk phos 827, total bilirubin 1.4, lactic acid 1.5. CT scan abd/pelvis performed and showed concerns for progression of biliary obstruction with severe intrahepatic biliary ductal dilatation increased compared to prior examination, increased PD dilatation up to 0.9 cm, newly indistinct and matted appearing retroperitoneal lymph nodes, concerns for cholangitis, and small volume ascites. ED physician spoke with Dr. Alice Reichert who recommended transfer to [facility] for higher level of care. ED physician spoke with [facility] physician, Dr. Francella Solian, who said no beds were available and recommended attempt at ERCP here locally with Dr. Allen Norris for re-stenting. Patient was admitted to the hospital. Dr. Alice Reichert and myself consulted for further evaluation and management.  ? ?Patient seen and examined this morning resting comfortably in ED stretcher. She denies any acute events overnight. She denies any fevers, chills, or altered mental status. Her pain is not well controlled. She reports pain is currently 7.5/10 in severity and located in her RUQ. It comes in waves and is dull and then sharp in intensity. Pain can radiate around to her back. She denies any diarrhea and does endorse some constipation. She did have ERCP performed 06/06/21 this month with Dr. Allen Norris where plastic stent was placed into CBD through the already present metal stent. She reports she lives by herself. She does not have a healthcare power of attorney. Her son, Shirley Barton, is involved in her medical care and lives locally. She is trying to keep a positive attitude. 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 Eliquis was held by Dr. Janese Banks this week. She did receive 1 unit pRBCs yesterday due to her anemia. There has been no overt GI blood loss.  ? ? ?Past Medical History:  ?Past Medical History:  ?Diagnosis Date  ? Abdominal pain, generalized   ? Anxiety   ? Atypical chest pain   ? Back pain   ? Basal cell carcinoma of skin 2013  ? resected from Left scalp area.   ? COPD (chronic obstructive pulmonary disease) ([place])   ? Depression   ? DOE (dyspnea on exertion)   ? GERD (gastroesophageal reflux disease)   ?  Hypertension   ? Pancreatic cancer ([place])   ? Shortness of breath dyspnea   ?  ?Problem List: ?Patient Active Problem List  ? Diagnosis Date Noted  ? 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 Sepsis ([place]) 06/28/2021  ? Shortness of breath 11/28/2020  ? Obstruction of bile duct   ? Malignant neoplasm of head of pancreas ([place])  08/24/2020  ? Bilirubinemia   ? Cholestasis   ? Abdominal pain 08/04/2020  ? HLD (hyperlipidemia) 08/04/2020  ? GERD (gastroesophageal reflux disease) 08/04/2020  ? Depression 08/04/2020  ? AAA (abdominal aortic aneurysm) 08/04/2020  ? Pancreatic mass 08/04/2020  ? Dilated bile duct 08/04/2020  ? Diarrhea 12/07/2017  ? Nausea and vomiting 12/07/2017  ? PAD (peripheral artery disease) ([place]) 01/25/2016  ? 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 Atherosclerosis of native artery of both lower extremities with intermittent claudication ([place]) 01/25/2016  ? Atypical chest pain 10/11/2015  ? DOE (dyspnea on exertion) 10/11/2015  ? Chronic abdominal pain 10/06/2015  ? Chronic obstructive pulmonary disease ([place]) 09/10/2013  ? Essential (primary) hypertension 06/30/2008  ?  ?Past Surgical History: ?Past Surgical History:  ?Procedure Laterality Date  ? BASAL CELL CARCINOMA EXCISION  2013  ? CARPAL TUNNEL RELEASE Left 90s  ? CHOLECYSTECTOMY N/A 09/16/2015  ? Procedure: LAPAROSCOPIC CHOLECYSTECTOMY WITH INTRAOPERATIVE CHOLANGIOGRAM;  Surgeon: Robert Bellow, MD;  Location: [facility] ORS;  Service: General;  Laterality: N/A;  ? COLONOSCOPY  1990"s  ? [place]  ? ENDOSCOPIC RETROGRADE CHOLANGIOPANCREATOGRAPHY (ERCP) WITH PROPOFOL N/A 11/10/2020  ? Procedure: ENDOSCOPIC RETROGRADE CHOLANGIOPANCREATOGRAPHY (ERCP) WITH PROPOFOL;  Surgeon: Lucilla Lame, MD;  Location: [facility] ENDOSCOPY;  Service: Endoscopy;  Laterality: N/A;  ? ERCP N/A 08/05/2020  ? Procedure: ENDOSCOPIC RETROGRADE CHOLANGIOPANCREATOGRAPHY (ERCP);  Surgeon: Lucilla Lame, MD;  Location: [facility] ENDOSCOPY;  Service: Endoscopy;  Laterality: N/A;  ? ERCP N/A 06/06/2021  ? Procedure: ENDOSCOPIC RETROGRADE CHOLANGIOPANCREATOGRAPHY (ERCP);  Surgeon: Lucilla Lame, MD;  Location: [facility] ENDOSCOPY;  Service: Endoscopy;  Laterality: N/A;  ? ESOPHAGOGASTRODUODENOSCOPY (EGD) WITH PROPOFOL N/A 02/05/2017  ? Procedure: ESOPHAGOGASTRODUODENOSCOPY (EGD) WITH PROPOFOL;  Surgeon:  Lollie Sails, MD;  Location: [facility] ENDOSCOPY;  Service: Endoscopy;  Laterality: N/A;  ? PERIPHERAL VASCULAR CATHETERIZATION Bilateral 12/27/2015  ? Procedure: Lower Extremity Angiography;  Surgeon: Katha Cabal, MD;  Location: [place] CV LAB;  Service: Cardiovascular;  Laterality: Bilateral;  ? PORTA CATH INSERTION N/A 09/08/2020  ? Procedure: PORTA CATH INSERTION;  Surgeon: Algernon Huxley, MD;  Location: [place] CV LAB;  Service: Cardiovascular;  Laterality: N/A;  ? TONSILLECTOMY    ?  ?Allergies: ?Allergies  ?Allergen Reactions  ? Iodinated Contrast Media Nausea Only and Other (See Comments)  ? Tramadol Other (See Comments)  ?  unknown  ? Penicillins Rash  ?  Pt states that she has been taking this medication and has had not problem  ?  ?Home Medications: ?(Not in a hospital admission) ? ?Home medication reconciliation was completed with the patient.  ? ?Scheduled Inpatient Medications: ?  ? megestrol  625 mg Oral Daily  ? [START ON 06/30/2021] mometasone-formoterol  2 puff Inhalation BID  ? pantoprazole  40 mg Oral Daily  ? ? ?Continuous Inpatient Infusions: ?  ? sodium chloride    ? ceFEPime (MAXIPIME) IV 2 g (06/29/21 1694)  ? lactated ringers 150 mL/hr at 06/29/21 0459  ? metronidazole 500 mg (06/29/21 0856)  ? ? ?PRN Inpatient Medications:  ?HYDROmorphone (DILAUDID) injection, morphine injection, ondansetron **OR** ondansetron (ZOFRAN) IV ? ?Family History: ?  family history includes Cerebral palsy in her daughter; Colon polyps in her sister; Depression in her son; Leukemia in her daughter.  The patient's family history is negative for inflammatory bowel disorders, GI malignancy, or solid organ transplantation. ? ?Social History:  ? reports that she quit smoking about 9 years ago. Her smoking use included cigarettes. She has never used smokeless tobacco. She reports that she does not drink alcohol and does not use drugs. The patient denies ETOH, tobacco, or drug use.  ? ?Review of  Systems: ?Constitutional: Weight is stable.  ?Eyes: No changes in vision. ?ENT: No oral lesions, sore throat.  ?GI: see HPI.  ?Heme/Lymph: No easy bruising.  ?CV: No chest pain.  ?GU: No hematuria.  ?Integumentary: No

## 2021-06-29 NOTE — ED Notes (Signed)
Pt transported to C-pod, room 30. Report received from [place], [place] ?

## 2021-06-29 NOTE — Consult Note (Signed)
?[place]  ?[phone] [phone] ? ?ID: Shirley Barton OB: [birth year]  MR#: [id number]  TDV#:[id number] ? ?Patient Care Team: ?Erma Heritage, MD as PCP - General (Family Medicine) ?Rico Junker, RN as Registered Nurse ?Theodore Demark, RN as Registered Nurse ?Adron Bene as Physician Assistant (Gastroenterology) ?Robert Bellow, MD (General Surgery) ?Clent Jacks, RN as Oncology Nurse Navigator ?Cammie Sickle, MD as Consulting Physician (Hematology and Oncology) ? ?CHIEF COMPLAINT: Pancreatic cancer, biliary obstruction, possible sepsis. ? ?INTERVAL HISTORY: Patient is a 71 year old female actively receiving chemotherapy for pancreatic cancer who presented to the emergency room with increasing abdominal pain, low-grade fever, and worsening anemia.  She has no neurologic complaints.  She has a poor appetite.  She has no chest pain, shortness of breath, cough, or hemoptysis.  She denies any nausea, vomiting, constipation, or diarrhea.  She has no urinary complaints.  Patient feels generally terrible, but offers no further specific complaints today. ? ?REVIEW OF SYSTEMS:   ?Review of Systems  ?Constitutional:  Positive for fever and malaise/fatigue. Negative for weight loss.  ?Respiratory: Negative.  Negative for cough, hemoptysis and shortness of breath.   ?Cardiovascular: Negative.  Negative for chest pain and leg swelling.  ?Gastrointestinal:  Positive for abdominal pain.  ?Genitourinary: Negative.  Negative for dysuria.  ?Musculoskeletal: Negative.  Negative for back pain.  ?Skin: Negative.  Negative for rash.  ?Neurological:  Positive for weakness. Negative for dizziness, focal weakness and headaches.  ?Psychiatric/Behavioral:  The patient is nervous/anxious.   ? ?As per HPI. Otherwise, a complete review of systems is negative. ? ?PAST MEDICAL HISTORY: ?Past Medical History:  ?Diagnosis Date  ? Abdominal pain, generalized   ? Anxiety    ? Atypical chest pain   ? Back pain   ? Basal cell carcinoma of skin 2013  ? resected from Left scalp area.   ? COPD (chronic obstructive pulmonary disease) ([place])   ? Depression   ? DOE (dyspnea on exertion)   ? GERD (gastroesophageal reflux disease)   ? Hypertension   ? Pancreatic cancer ([place])   ? Shortness of breath dyspnea   ? ? ?PAST SURGICAL HISTORY: ?Past Surgical History:  ?Procedure Laterality Date  ? BASAL CELL CARCINOMA EXCISION  2013  ? CARPAL TUNNEL RELEASE Left 90s  ? CHOLECYSTECTOMY N/A 09/16/2015  ? Procedure: LAPAROSCOPIC CHOLECYSTECTOMY WITH INTRAOPERATIVE CHOLANGIOGRAM;  Surgeon: Robert Bellow, MD;  Location: [facility] ORS;  Service: General;  Laterality: N/A;  ? COLONOSCOPY  1990"s  ? [place]  ? ENDOSCOPIC RETROGRADE CHOLANGIOPANCREATOGRAPHY (ERCP) WITH PROPOFOL N/A 11/10/2020  ? Procedure: ENDOSCOPIC RETROGRADE CHOLANGIOPANCREATOGRAPHY (ERCP) WITH PROPOFOL;  Surgeon: Lucilla Lame, MD;  Location: [facility] ENDOSCOPY;  Service: Endoscopy;  Laterality: N/A;  ? ERCP N/A 08/05/2020  ? Procedure: ENDOSCOPIC RETROGRADE CHOLANGIOPANCREATOGRAPHY (ERCP);  Surgeon: Lucilla Lame, MD;  [facility] ENDOSCOPY;  Service: Endoscopy;  Laterality: N/A;  ? ERCP N/A 06/06/2021  ? Procedure: ENDOSCOPIC RETROGRADE CHOLANGIOPANCREATOGRAPHY (ERCP);  Surgeon: Lucilla Lame, MD;  Location: [facility] ENDOSCOPY;  Service: Endoscopy;  Laterality: N/A;  ? ESOPHAGOGASTRODUODENOSCOPY (EGD) WITH PROPOFOL N/A 02/05/2017  ? Procedure: ESOPHAGOGASTRODUODENOSCOPY (EGD) WITH PROPOFOL;  Surgeon: Lollie Sails, MD;  Location: [facility] ENDOSCOPY;  Service: Endoscopy;  Laterality: N/A;  ? PERIPHERAL VASCULAR CATHETERIZATION Bilateral 12/27/2015  ? Procedure: Lower Extremity Angiography;  Surgeon: Katha Cabal, MD;  Location: [place] CV LAB;  Service: Cardiovascular;  Laterality: Bilateral;  ? PORTA CATH INSERTION N/A 09/08/2020  ? Procedure: PORTA CATH INSERTION;  Surgeon:  Algernon Huxley, MD;  Location: [place] CV LAB;  Service:  Cardiovascular;  Laterality: N/A;  ? TONSILLECTOMY    ? ? ?FAMILY HISTORY: ?Family History  ?Problem Relation Age of Onset  ? Cerebral palsy Daughter   ? Colon polyps Sister   ? Leukemia Daughter   ? Depression Son   ? Breast cancer Neg Hx   ? ? ?ADVANCED DIRECTIVES (Y/N):  '@ADVDIR'$ @ ? ?HEALTH MAINTENANCE: ?Social History  ? ?Tobacco Use  ? Smoking status: Former  ?  Types: Cigarettes  ?  Quit date: 03/02/2012  ?  Years since quitting: 9.3  ? Smokeless tobacco: Never  ?Vaping Use  ? Vaping Use: Never used  ?Substance Use Topics  ? Alcohol use: No  ?  Alcohol/week: 0.0 standard drinks  ? Drug use: No  ? ? ? Colonoscopy: ? PAP: ? Bone density: ? Lipid panel: ? ?Allergies  ?Allergen Reactions  ? Iodinated Contrast Media Nausea Only and Other (See Comments)  ? Tramadol Other (See Comments)  ?  unknown  ? Penicillins Rash  ?  Pt states that she has been taking this medication and has had not problem  ? ? ?Current Facility-Administered Medications  ?Medication Dose Route Frequency Provider Last Rate Last Admin  ? 0.9 %  sodium chloride infusion  10 mL/hr Intravenous Once Vladimir Crofts, MD      ? ceFEPIme (MAXIPIME) 2 g in sodium chloride 0.9 % 100 mL IVPB  2 g Intravenous Q12H Athena Masse, MD   Stopped at 06/29/21 [phone]  ? HYDROmorphone (DILAUDID) injection 0.5 mg  0.5 mg Intravenous Q3H PRN Athena Masse, MD   0.5 mg at 06/29/21 1243  ? lactated ringers infusion   Intravenous Continuous Gwynne Edinger, MD 100 mL/hr at 06/29/21 1000 Rate Change at 06/29/21 1000  ? megestrol (MEGACE) 400 MG/10ML suspension 625 mg  625 mg Oral Daily Gwynne Edinger, MD   625 mg at 06/29/21 1029  ? metroNIDAZOLE (FLAGYL) IVPB 500 mg  500 mg Intravenous Q12H Athena Masse, MD   Stopped at 06/29/21 [phone]  ? [START ON 06/30/2021] mometasone-formoterol (DULERA) 100-5 MCG/ACT inhaler 2 puff  2 puff Inhalation BID Wouk, Ailene Rud, MD      ? morphine (PF) 2 MG/ML injection 2 mg  2 mg Intravenous Q2H PRN Athena Masse, MD   2 mg at  06/29/21 1030  ? ondansetron (ZOFRAN) tablet 4 mg  4 mg Oral Q6H PRN Athena Masse, MD      ? Or  ? ondansetron [facility]) injection 4 mg  4 mg Intravenous Q6H PRN Athena Masse, MD      ? pantoprazole (PROTONIX) EC tablet 40 mg  40 mg Oral Daily Gwynne Edinger, MD   40 mg at 06/29/21 1038  ? ?Facility-Administered Medications Ordered in Other Encounters  ?Medication Dose Route Frequency Provider Last Rate Last Admin  ? heparin lock flush 100 unit/mL  500 Units Intravenous Once Charlaine Dalton R, MD      ? sodium chloride flush (NS) 0.9 % injection 10 mL  10 mL Intravenous PRN Cammie Sickle, MD      ? ? ?OBJECTIVE: ?Vitals:  ? 06/29/21 1145 06/29/21 1200  ?BP: (!) 148/80   ?Pulse: 100 96  ?Resp: (!) 22   ?Temp: 98.6 ?F (37 ?C)   ?SpO2: 94% 96%  ?   Body mass index is 16.22 kg/m?Marland Kitchen    ECOG FS:3 - Symptomatic, >50% confined to bed ? ?General:  Ill-appearing, no acute distress. ?Eyes: Pink conjunctiva, anicteric sclera. ?HEENT: Normocephalic, moist mucous membranes. ?Lungs: No audible wheezing or coughing. ?Heart: Regular rate and rhythm. ?Abdomen: Moderate tenderness.  Musculoskeletal: No edema, cyanosis, or clubbing. ?Neuro: Alert, answering all questions appropriately. Cranial nerves grossly intact. ?Skin: No rashes or petechiae noted. ?Psych: Normal affect. ? ?LAB RESULTS: ? ?Lab Results  ?Component Value Date  ? NA 136 06/29/2021  ? K 3.6 06/29/2021  ? CL 105 06/29/2021  ? CO2 25 06/29/2021  ? GLUCOSE 130 (H) 06/29/2021  ? BUN 11 06/29/2021  ? CREATININE 0.55 06/29/2021  ? CALCIUM 7.8 (L) 06/29/2021  ? PROT 5.9 (L) 06/29/2021  ? ALBUMIN 1.9 (L) 06/29/2021  ? AST 31 06/29/2021  ? ALT 18 06/29/2021  ? ALKPHOS 878 (H) 06/29/2021  ? BILITOT 1.8 (H) 06/29/2021  ? GFRNONAA >60 06/29/2021  ? GFRAA >60 04/06/2017  ? ? ?Lab Results  ?Component Value Date  ? WBC 9.3 06/29/2021  ? NEUTROABS 12.2 (H) 06/28/2021  ? HGB 7.4 (L) 06/29/2021  ? HCT 22.7 (L) 06/29/2021  ? MCV 82.5 06/29/2021  ? PLT 278 06/29/2021   ? ? ? ?STUDIES: ?DG Chest 2 View ? ?Result Date: 06/28/2021 ?CLINICAL DATA:  Tachycardia tachypnea, evaluate infiltrate. EXAM: CHEST - 2 VIEW COMPARISON:  Chest radiograph dated December 08, 2020 FINDINGS: T

## 2021-06-30 ENCOUNTER — Inpatient Hospital Stay: Payer: Medicare HMO | Admitting: Certified Registered Nurse Anesthetist

## 2021-06-30 ENCOUNTER — Encounter
Admission: EM | Disposition: A | Discharge status: Home / Self Care | Payer: Self-pay | Source: Ambulatory Visit | Attending: Internal Medicine

## 2021-06-30 ENCOUNTER — Inpatient Hospital Stay: Payer: Medicare HMO

## 2021-06-30 DIAGNOSIS — K831 Obstruction of bile duct: Secondary | ICD-10-CM | POA: Diagnosis not present

## 2021-06-30 DIAGNOSIS — C801 Malignant (primary) neoplasm, unspecified: Secondary | ICD-10-CM | POA: Diagnosis not present

## 2021-06-30 DIAGNOSIS — Z515 Encounter for palliative care: Secondary | ICD-10-CM | POA: Diagnosis not present

## 2021-06-30 DIAGNOSIS — E440 Moderate protein-calorie malnutrition: Secondary | ICD-10-CM | POA: Diagnosis present

## 2021-06-30 HISTORY — PX: ERCP: SHX5425

## 2021-06-30 LAB — CBC
HCT: 27.9 % — ABNORMAL LOW (ref 36.0–46.0)
Hemoglobin: 8.7 g/dL — ABNORMAL LOW (ref 12.0–15.0)
MCH: 26.2 pg (ref 26.0–34.0)
MCHC: 31.2 g/dL (ref 30.0–36.0)
MCV: 84 fL (ref 80.0–100.0)
Platelets: 355 10*3/uL (ref 150–400)
RBC: 3.32 MIL/uL — ABNORMAL LOW (ref 3.87–5.11)
RDW: 16.5 % — ABNORMAL HIGH (ref 11.5–15.5)
WBC: 11.4 10*3/uL — ABNORMAL HIGH (ref 4.0–10.5)
nRBC: 0 % (ref 0.0–0.2)

## 2021-06-30 LAB — COMPREHENSIVE METABOLIC PANEL
ALT: 20 U/L (ref 0–44)
AST: 35 U/L (ref 15–41)
Albumin: 2 g/dL — ABNORMAL LOW (ref 3.5–5.0)
Alkaline Phosphatase: 891 U/L — ABNORMAL HIGH (ref 38–126)
Anion gap: 7 (ref 5–15)
BUN: 9 mg/dL (ref 8–23)
CO2: 25 mmol/L (ref 22–32)
Calcium: 8.1 mg/dL — ABNORMAL LOW (ref 8.9–10.3)
Chloride: 104 mmol/L (ref 98–111)
Creatinine, Ser: 0.51 mg/dL (ref 0.44–1.00)
GFR, Estimated: >60 mL/min (ref >60)
Glucose, Bld: 85 mg/dL (ref 70–99)
Potassium: 3.6 mmol/L (ref 3.5–5.1)
Sodium: 136 mmol/L (ref 135–145)
Total Bilirubin: 2.1 mg/dL — ABNORMAL HIGH (ref 0.3–1.2)
Total Protein: 6 g/dL — ABNORMAL LOW (ref 6.5–8.1)

## 2021-06-30 LAB — URINALYSIS, COMPLETE (UACMP) WITH MICROSCOPIC
Bilirubin Urine: NEGATIVE
Glucose, UA: NEGATIVE mg/dL
Ketones, ur: 5 mg/dL — AB
Nitrite: NEGATIVE
Protein, ur: NEGATIVE mg/dL
Specific Gravity, Urine: 1.014 (ref 1.005–1.030)
pH: 5 (ref 5.0–8.0)

## 2021-06-30 LAB — URINE CULTURE: Culture: NO GROWTH

## 2021-06-30 LAB — GLUCOSE, CAPILLARY: Glucose-Capillary: 124 mg/dL — ABNORMAL HIGH (ref 70–99)

## 2021-06-30 SURGERY — ERCP, WITH INTERVENTION IF INDICATED
Anesthesia: General

## 2021-06-30 MED ORDER — PROPOFOL 10 MG/ML IV BOLUS
INTRAVENOUS | Status: DC | PRN
  Administered 2021-06-30: 60 mg via INTRAVENOUS

## 2021-06-30 MED ORDER — DEXMEDETOMIDINE (PRECEDEX) IN NS 20 MCG/5ML (4 MCG/ML) IV SYRINGE
PREFILLED_SYRINGE | INTRAVENOUS | Status: DC | PRN
  Administered 2021-06-30 (×2): 4 ug via INTRAVENOUS

## 2021-06-30 MED ORDER — PANTOPRAZOLE SODIUM 40 MG IV SOLR
40.0000 mg | Freq: Two times a day (BID) | INTRAVENOUS | Status: DC
  Administered 2021-06-30 – 2021-07-05 (×11): 40 mg via INTRAVENOUS
  Filled 2021-06-30 (×11): qty 10

## 2021-06-30 MED ORDER — SODIUM CHLORIDE 0.9 % IV SOLN
INTRAVENOUS | Status: DC
  Administered 2021-06-30: 1000 mL via INTRAVENOUS

## 2021-06-30 MED ORDER — LIDOCAINE HCL (CARDIAC) PF 100 MG/5ML IV SOSY
PREFILLED_SYRINGE | INTRAVENOUS | Status: DC | PRN
  Administered 2021-06-30: 60 mg via INTRAVENOUS

## 2021-06-30 MED ORDER — PROPOFOL 500 MG/50ML IV EMUL
INTRAVENOUS | Status: DC | PRN
  Administered 2021-06-30: 140 ug/kg/min via INTRAVENOUS

## 2021-06-30 NOTE — Consult Note (Signed)
? ?  ?Palliative Medicine ?[place] [facility] ?Telephone:(336) [phone] Fax:(336) [phone] ? ? ?Name: Shirley Barton ?Date: 06/30/2021 ?MRN: [id number]  ?DOB: Jul 17, 1950 ? ?Patient Care Team: ?Erma Heritage, MD as PCP - General (Family Medicine) ?Rico Junker, RN as Registered Nurse ?Theodore Demark, RN as Registered Nurse ?Adron Bene as Physician Assistant (Gastroenterology) ?Robert Bellow, MD (General Surgery) ?Clent Jacks, RN as Oncology Nurse Navigator ?Cammie Sickle, MD as Consulting Physician (Hematology and Oncology)  ? ? ?REASON FOR CONSULTATION: ?Shirley Barton is a 71 y.o. female with multiple medical problems including metastatic pancreatic cancer with concern for disease progression based on recent imaging he has been on systemic chemotherapy.  Patient saw Dr. Janese Banks on 06/26/2021 with concern for sepsis.  Patient was started on oral Levaquin after refusing hospitalization.  Patient was subsequently hospitalized 06/28/2021 with sepsis and found to have cholangitis with CT showing intrahepatic bile duct dilation.  Patient is status post several biliary stents.  She is pending ERCP.  Palliative care was consulted to address goals. ? ?SOCIAL HISTORY:    ? reports that she quit smoking about 9 years ago. Her smoking use included cigarettes. She has never used smokeless tobacco. She reports that she does not drink alcohol and does not use drugs. ? ?Patient lives at home alone.  She has a son who lives nearby.  She has no son in [place]. ? ?ADVANCE DIRECTIVES:  ?Does not have ? ?CODE STATUS: Full code ? ?PAST MEDICAL HISTORY: ?Past Medical History:  ?Diagnosis Date  ? Abdominal pain, generalized   ? Anxiety   ? Atypical chest pain   ? Back pain   ? Basal cell carcinoma of skin 2013  ? resected from Left scalp area.   ? COPD (chronic obstructive pulmonary disease) ([place])   ? Depression   ? DOE (dyspnea on exertion)   ? GERD (gastroesophageal reflux  disease)   ? Hypertension   ? Pancreatic cancer ([place])   ? Shortness of breath dyspnea   ? ? ?PAST SURGICAL HISTORY:  ?Past Surgical History:  ?Procedure Laterality Date  ? BASAL CELL CARCINOMA EXCISION  2013  ? CARPAL TUNNEL RELEASE Left 90s  ? CHOLECYSTECTOMY N/A 09/16/2015  ? Procedure: LAPAROSCOPIC CHOLECYSTECTOMY WITH INTRAOPERATIVE CHOLANGIOGRAM;  Surgeon: Robert Bellow, MD;  Location: [facility] ORS;  Service: General;  Laterality: N/A;  ? COLONOSCOPY  1990"s  ? [place]  ? ENDOSCOPIC RETROGRADE CHOLANGIOPANCREATOGRAPHY (ERCP) WITH PROPOFOL N/A 11/10/2020  ? Procedure: ENDOSCOPIC RETROGRADE CHOLANGIOPANCREATOGRAPHY (ERCP) WITH PROPOFOL;  Surgeon: Lucilla Lame, MD;  Location: [facility] ENDOSCOPY;  Service: Endoscopy;  Laterality: N/A;  ? ERCP N/A 08/05/2020  ? Procedure: ENDOSCOPIC RETROGRADE CHOLANGIOPANCREATOGRAPHY (ERCP);  Surgeon: Lucilla Lame, MD;  Location: [facility] ENDOSCOPY;  Service: Endoscopy;  Laterality: N/A;  ? ERCP N/A 06/06/2021  ? Procedure: ENDOSCOPIC RETROGRADE CHOLANGIOPANCREATOGRAPHY (ERCP);  Surgeon: Lucilla Lame, MD;  Location: [facility] ENDOSCOPY;  Service: Endoscopy;  Laterality: N/A;  ? ESOPHAGOGASTRODUODENOSCOPY (EGD) WITH PROPOFOL N/A 02/05/2017  ? Procedure: ESOPHAGOGASTRODUODENOSCOPY (EGD) WITH PROPOFOL;  Surgeon: Lollie Sails, MD;  Location: [facility] ENDOSCOPY;  Service: Endoscopy;  Laterality: N/A;  ? PERIPHERAL VASCULAR CATHETERIZATION Bilateral 12/27/2015  ? Procedure: Lower Extremity Angiography;  Surgeon: Katha Cabal, MD;  Location: [facility];  Service: Cardiovascular;  Laterality: Bilateral;  ? PORTA CATH INSERTION N/A 09/08/2020  ? Procedure: PORTA CATH INSERTION;  Surgeon: Algernon Huxley, MD;  Location: [place] CV LAB;  Service: Cardiovascular;  Laterality: N/A;  ?  TONSILLECTOMY    ? ? ?HEMATOLOGY/ONCOLOGY HISTORY:  ?Oncology History Overview Note  ?# MAY 2022-pancreatic adenocarcinoma [obstructive jaundice-]MRI-18 mm pancreatic head mass; EUS [[facility];  Dr.Spaete]-abutment of portal vein/SMV; no lymphadenopathy noted; May 5 CA 19-9-163; s/p stenting -May 25th-47.  ? ?#Obstructive jaundice-s/p ERCP stenting [Dr.Wohl] ? ?MAY 2022- ENDOSONOGRAPHIC FINDING: : ?A round mass was identified in the pancreatic head.  ?The mass was hypoechoic. The mass measured 16 mm by 17  ?mm in maximal cross-sectional diameter. The outer  ?margins were irregular. There was sonographic evidence  ?suggesting invasion into the portal vein (manifested  ?by abutment) and the superior mesenteric vein  ?(manifested by abutment).  ? ?#Pancreatic adenocarcinoma pT1 however on EUS/CT [[facility]. Dr.Shah]-concerning for tumor abutting the celiac axis /common hepatic artery /portal vein/superior mesenteric vein; without any metastatic disease.  Borderline resectable Elevated CA 19-9.  [S/p evaluation at [place] ? ?# June 9th- FOLFIRINOX cycle #1; discontinued because of poor GI tolerance ? ?# &/01-2021-gemcitabine Abraxane ['100mg'$ /2] day 1 day 815 ? ?# July 11th-gemcitabine and Abraxane day.  November 15, 2020-CT scan progressive disease/poor tolerance to therapy ? ?# AUG 29th, 2022-gemcitabine single agent weekly-concurrent radiation;  ?December 12, 2020-discontinued gemcitabine single agent because of significant respiratory distress post gemcitabine infusion.  Continue local radiation [finished OCT 7902] ?---------------------------------------------------------  ? ? ?  ?Malignant neoplasm of head of pancreas ([place])  ?08/24/2020 Initial Diagnosis  ? Malignant neoplasm of head of pancreas [facility]) ?  ?09/09/2020 - 09/12/2020 Chemotherapy  ?  ? ?  ? ?  ?10/10/2020 - 12/06/2020 Chemotherapy  ? Patient is on Treatment Plan : PANCREATIC Abraxane / Gemcitabine D1,8,15 q28d  ?   ?06/26/2021 -  Chemotherapy  ? Patient is on Treatment Plan : PANCREAS Liposomal Irinotecan / Leucovorin / 5-FU  q14d  ?   ? ? ?ALLERGIES:  is allergic to iodinated contrast media and tramadol. ? ?MEDICATIONS:  ?Current  Facility-Administered Medications  ?Medication Dose Route Frequency Provider Last Rate Last Admin  ? [MAR Hold] (feeding supplement) PROSource Plus liquid 30 mL  30 mL Oral TID BM Athena Masse, MD   30 mL at 06/29/21 1622  ? [MAR Hold] 0.9 %  sodium chloride infusion  10 mL/hr Intravenous Once Vladimir Crofts, MD      ? 0.9 %  sodium chloride infusion   Intravenous Continuous Geanie Kenning, PA-C 20 mL/hr at 06/30/21 1240 1,000 mL at 06/30/21 1240  ? [MAR Hold] ceFEPIme (MAXIPIME) 2 g in sodium chloride 0.9 % 100 mL IVPB  2 g Intravenous Q12H Athena Masse, MD 200 mL/hr at 06/30/21 0846 2 g at 06/30/21 0846  ? [MAR Hold] Chlorhexidine Gluconate Cloth 2 % PADS 6 each  6 each Topical Daily Athena Masse, MD   6 each at 06/30/21 [phone]  ? [MAR Hold] HYDROmorphone (DILAUDID) injection 0.5 mg  0.5 mg Intravenous Q3H PRN Athena Masse, MD   0.5 mg at 06/30/21 0950  ? lactated ringers infusion   Intravenous Continuous Gwynne Edinger, MD 100 mL/hr at 06/29/21 2315 New Bag at 06/29/21 2315  ? [MAR Hold] megestrol (MEGACE) 400 MG/10ML suspension 625 mg  625 mg Oral Daily Gwynne Edinger, MD   625 mg at 06/30/21 3532  ? [MAR Hold] metroNIDAZOLE (FLAGYL) IVPB 500 mg  500 mg Intravenous Q12H Judd Gaudier V, MD 100 mL/hr at 06/30/21 0951 500 mg at 06/30/21 0951  ? [MAR Hold] mometasone-formoterol (DULERA) 100-5 MCG/ACT inhaler 2 puff  2 puff Inhalation BID Wouk, [employer]  Cathleen Fears, MD   2 puff at 06/30/21 [phone]  ? [MAR Hold] morphine (PF) 2 MG/ML injection 2 mg  2 mg Intravenous Q2H PRN Athena Masse, MD   2 mg at 06/29/21 1030  ? [MAR Hold] multivitamin with minerals tablet 1 tablet  1 tablet Oral Daily Athena Masse, MD   1 tablet at 06/29/21 1622  ? [MAR Hold] ondansetron (ZOFRAN) tablet 4 mg  4 mg Oral Q6H PRN Athena Masse, MD      ? Or  ? [MAR Hold] ondansetron (ZOFRAN) injection 4 mg  4 mg Intravenous Q6H PRN Athena Masse, MD      ? Doug Sou Hold] pantoprazole (PROTONIX) injection 40 mg  40 mg Intravenous  Q12H Wouk, Ailene Rud, MD   40 mg at 06/30/21 1151  ? ?Facility-Administered Medications Ordered in Other Encounters  ?Medication Dose Route Frequency Provider Last Rate Last Admin  ? heparin lock flush 100 unit/mL  500 Units Intravenou

## 2021-06-30 NOTE — TOC Initial Note (Signed)
Transition of Care (TOC) - Initial/Assessment Note  ? ? ?Patient Details  ?Name: Shirley Barton ?MRN: [id number] ?Date of Birth: 03-13-1951 ? ?Transition of Care (TOC) CM/SW Contact:    ?Kandiss Ihrig E Takeia Ciaravino, LCSW ?Phone Number: ?06/30/2021, 10:25 AM ? ?Clinical Narrative:                CSW met with patient at bedside. Patient lives alone. Patient can drive but her son has been driving her to appts. Currently getting chemo. PCP is Dr. Albertina Parr. Pharmacy is [facility]. No DME, HH, SNF history. Patient states she does not feel she needs HHPT, she agreed to let staff know if she changes her mind prior to DC, and agreed to reach out to her PCP if she decides she wants [place] after DC. ? ? ?Expected Discharge Plan: Home/Self Care ?Barriers to Discharge: Continued Medical Work up ? ? ?Patient Goals and CMS Choice ?Patient states their goals for this hospitalization and ongoing recovery are:: to return home ?CMS Medicare.gov Compare Post Acute Care list provided to:: Patient ?Choice offered to / list presented to : Patient ? ?Expected Discharge Plan and Services ?Expected Discharge Plan: Home/Self Care ?  ?  ?  ?  ?                ?  ?  ?  ?  ?  ?  ?  ?  ?  ?  ? ?Prior Living Arrangements/Services ?  ?  ?Patient language and need for interpreter reviewed:: Yes ?Do you feel safe going back to the place where you live?: Yes      ?Need for Family Participation in Patient Care: Yes (Comment) ?Care giver support system in place?: Yes (comment) ?  ?Criminal Activity/Legal Involvement Pertinent to Current Situation/Hospitalization: No - Comment as needed ? ?Activities of Daily Living ?Home Assistive Devices/Equipment: None ?ADL Screening (condition at time of admission) ?Patient's cognitive ability adequate to safely complete daily activities?: Yes ?Is the patient deaf or have difficulty hearing?: No ?Does the patient have difficulty seeing, even when wearing glasses/contacts?: No ?Does the patient have difficulty concentrating,  remembering, or making decisions?: No ?Patient able to express need for assistance with ADLs?: Yes ?Does the patient have difficulty dressing or bathing?: No ?Independently performs ADLs?: Yes (appropriate for developmental age) ?Does the patient have difficulty walking or climbing stairs?: No ?Weakness of Legs: None ?Weakness of Arms/Hands: None ? ?Permission Sought/Granted ?  ?Permission granted to share information with : Yes, Verbal Permission Granted ?   ?   ?   ?   ? ?Emotional Assessment ?  ?  ?  ?Orientation: : Oriented to Self, Oriented to Place, Oriented to  Time, Oriented to Situation ?Alcohol / Substance Use: Not Applicable ?Psych Involvement: No (comment) ? 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 Diagnosis Date Noted  ? Malnutrition of moderate degree 06/30/2021  ? 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 Sepsis ([place]) 06/28/2021  ? Shortness of breath 11/28/2020  ? Obstruction of bile duct   ? Malignant neoplasm of head of pancreas ([place]) 08/24/2020  ? Bilirubinemia   ? Cholestasis   ? Abdominal pain 08/04/2020  ? HLD (hyperlipidemia) 08/04/2020  ? GERD (gastroesophageal reflux disease) 08/04/2020  ? Depression 08/04/2020  ? AAA (abdominal aortic aneurysm) 08/04/2020  ? Pancreatic mass 08/04/2020  ? Dilated bile duct 08/04/2020  ? Diarrhea  12/07/2017  ? Nausea and vomiting 12/07/2017  ? PAD (peripheral artery disease) ([place]) 01/25/2016  ? Iliac artery stenosis, bilateral ([place]) 01/25/2016  ? Atherosclerosis of native artery of both lower extremities with intermittent claudication ([place]) 01/25/2016  ? Atypical chest pain 10/11/2015  ? DOE (dyspnea on exertion) 10/11/2015  ? Chronic abdominal pain 10/06/2015  ? Chronic obstructive pulmonary disease ([place]) 09/10/2013  ? Essential (primary) hypertension 06/30/2008  ? ?PCP:  Erma Heritage,  MD ?Pharmacy:   ?[facility] [address] ?[place] [address] ?Phone: [phone] Fax: [phone] ? ? ? ? ?Social Determinants of Health (SDOH) Interventions ?  ? ?Readmission Risk Interventions ? ?  06/30/2021  ? 10:24 AM  ?Readmission Risk Prevention Plan  ?Transportation Screening Complete  ?PCP or Specialist Appt within 3-5 Days Complete  ?[place] or Home Care Consult Complete  ?Social Work Consult for [place] Planning/Counseling Complete  ?Palliative Care Screening Not Applicable  ?Medication Review Press photographer) Complete  ? ? ? ?

## 2021-06-30 NOTE — Transfer of Care (Signed)
Immediate Anesthesia Transfer of Care Note ? ?Patient: Shirley Barton ? ?Procedure(s) Performed: ENDOSCOPIC RETROGRADE CHOLANGIOPANCREATOGRAPHY (ERCP) ? ?Patient Location: Endoscopy Unit ? ?Anesthesia Type:General ? ?Level of Consciousness: sedated ? ?Airway & Oxygen Therapy: Patient Spontanous Breathing ? ?Post-op Assessment: Report given to RN and Post -op Vital signs reviewed and stable ? ?Post vital signs: Reviewed ? ?Last Vitals:  ?Vitals Value Taken Time  ?BP 108/65 06/30/21 1418  ?Temp 35.8 ?C 06/30/21 1418  ?Pulse 89 06/30/21 1418  ?Resp 17 06/30/21 1418  ?SpO2 98 % 06/30/21 1418  ? ? ?Last Pain:  ?Vitals:  ? 06/30/21 1418  ?TempSrc: Temporal  ?PainSc: Asleep  ?   ? ?Patients Stated Pain Goal: 0 (06/30/21 1231) ? ?Complications: No notable events documented. ?

## 2021-06-30 NOTE — Op Note (Signed)
[facility] ?Gastroenterology ?Patient Name: Shirley Barton ?Procedure Date: 06/30/2021 12:54 PM ?MRN: [id number] ?Account #: [id number] ?Date of Birth: 10/24/1950 ?Admit Type: Outpatient ?Age: 71 ?Room: [facility] ENDO ROOM 4 ?Gender: Female ?Note Status: Finalized ?Instrument Name: [id number] ?Procedure:             ERCP ?Indications:           Malignant tumor of the head of pancreas ?Providers:             Lucilla Lame MD, MD ?Referring MD:          Erma Heritage MD ?Medicines:             Propofol per Anesthesia ?Complications:         No immediate complications. ?Procedure:             Pre-Anesthesia Assessment: ?                       - Prior to the procedure, a History and Physical was  ?                       performed, and patient medications and allergies were  ?                       reviewed. The patient's tolerance of previous  ?                       anesthesia was also reviewed. The risks and benefits  ?                       of the procedure and the sedation options and risks  ?                       were discussed with the patient. All questions were  ?                       answered, and informed consent was obtained. Prior  ?                       Anticoagulants: The patient has taken no previous  ?                       anticoagulant or antiplatelet agents. ASA Grade  ?                       Assessment: III - A patient with severe systemic  ?                       disease. After reviewing the risks and benefits, the  ?                       patient was deemed in satisfactory condition to  ?                       undergo the procedure. ?                       After obtaining informed consent, the scope was passed  ?  under direct vision. Throughout the procedure, the  ?                       patient's blood pressure, pulse, and oxygen  ?                       saturations were monitored continuously. The  ?                       Duodenoscope was introduced  through the mouth, and  ?                       used to inject contrast into and used to inject  ?                       contrast into the bile duct. The ERCP was accomplished  ?                       without difficulty. The patient tolerated the  ?                       procedure well. ?Findings: ?     A scout film of the abdomen was obtained. Two stents ending in the main  ?     bile duct were seen. Two covered metal plastic stents originating in the  ?     common bile duct were emerging from the major papilla. One stent was  ?     removed from the common bile duct using a snare. A wire was passed into  ?     the biliary tree. The previous metal stent could not be removed. One 2  ?     by 8 cm covered metal stent was placed into the common bile duct. Bile  ?     flowed through the stent. The stent was in good position. ?Impression:            - Two stents from the common bile duct were seen in  ?                       the major papilla. ?                       - One stent was removed from the common bile duct. ?                       - One covered metal stent was placed into the common  ?                       bile duct. ?                       - There was a lot of inflammation in the antrum and  ?                       duodenum without any fresh blood or ulcers seen. ?                       - Tumor invasion into the duodenum cant be r/o. ?Recommendation:        -  Resume previous diet. ?Procedure Code(s):     --- Professional --- ?                       [phone], Endoscopic retrograde cholangiopancreatography  ?                       (ERCP); with removal and exchange of stent(s), biliary  ?                       or pancreatic duct, including pre- and post-dilation  ?                       and guide wire passage, when performed, including  ?                       sphincterotomy, when performed, each stent exchanged ?Diagnosis Code(s):     --- Professional --- ?                       C25.0, Malignant neoplasm of head  of pancreas ?                       Z46.59, Encounter for fitting and adjustment of other  ?                       gastrointestinal appliance and device ?CPT copyright 2019 American Medical Association. All rights reserved. ?The codes documented in this report are preliminary and upon coder review may  ?be revised to meet current compliance requirements. ?Lucilla Lame MD, MD ?06/30/2021 5:18:43 PM ?This report has been signed electronically. ?Number of Addenda: 0 ?Note Initiated On: 06/30/2021 12:54 PM ?Estimated Blood Loss:  Estimated blood loss: none. ?     [facility] ?

## 2021-06-30 NOTE — Progress Notes (Signed)
PT attempt. Pt currently off floor at procedure. Acute therapy will continue to follow per current POC and return when pt is available to participate.  ?

## 2021-06-30 NOTE — Care Management Important Message (Signed)
Important Message ? ?Patient Details  ?Name: Shirley Barton ?MRN: [id number] ?Date of Birth: 11/01/1950 ? ? ?Medicare Important Message Given:  Yes ? ? ? ? ?[name] A [name] ?06/30/2021, 2:59 PM ?

## 2021-06-30 NOTE — Anesthesia Preprocedure Evaluation (Addendum)
Anesthesia Evaluation  ?Patient identified by MRN, date of birth, ID band ?Patient awake ? ? ? ?Reviewed: ?Allergy & Precautions, NPO status , Patient's Chart, lab work & pertinent test results ? ?History of Anesthesia Complications ?Negative for: history of anesthetic complications ? ?Airway ?Mallampati: III ? ?TM Distance: <3 FB ?Neck ROM: limited ? ? ? Dental ? ?(+) Lower Dentures, Upper Dentures, Dental Advidsory Given ?  ?Pulmonary ?shortness of breath and with exertion, COPD, former smoker,  ?  ?Pulmonary exam normal ? ? ? ? ? ? ? Cardiovascular ?hypertension, (-) angina+ Peripheral Vascular Disease and + DOE  ?Normal cardiovascular exam ? ? ?  ?Neuro/Psych ?PSYCHIATRIC DISORDERS negative neurological ROS ?   ? GI/Hepatic ?Neg liver ROS, GERD  Medicated,  ?Endo/Other  ?negative endocrine ROS ? Renal/GU ?negative Renal ROS  ?negative genitourinary ?  ?Musculoskeletal ? ? Abdominal ?Normal abdominal exam  (+)   ?Peds ? Hematology ?negative hematology ROS ?(+)   ?Anesthesia Other Findings ?Patient is NPO appropriate and reports no nausea or vomiting today. ? ?Past Medical History: ?No date: Abdominal pain, generalized ?No date: Anxiety ?No date: Atypical chest pain ?No date: Back pain ?2013: Basal cell carcinoma of skin ?    Comment:  resected from Left scalp area.  ?No date: COPD (chronic obstructive pulmonary disease) ([place]) ?No date: Depression ?No date: DOE (dyspnea on exertion) ?No date: GERD (gastroesophageal reflux disease) ?No date: Hypertension ?No date: Pancreatic cancer [facility]) ?No date: Shortness of breath dyspnea ? ?Past Surgical History: ?2013: BASAL CELL CARCINOMA EXCISION ?90s: CARPAL TUNNEL RELEASE; Left ?09/16/2015: CHOLECYSTECTOMY; N/A ?    Comment:  Procedure: LAPAROSCOPIC CHOLECYSTECTOMY WITH  ?             INTRAOPERATIVE CHOLANGIOGRAM;  Surgeon: Forest Gleason  ?             Bary Castilla, MD;  Location: [facility] ORS;  Service: General;   ?             Laterality:  N/A; ?1990"s: COLONOSCOPY ?    Comment:  [place] ?08/05/2020: ERCP; N/A ?    Comment:  Procedure: ENDOSCOPIC RETROGRADE  ?             CHOLANGIOPANCREATOGRAPHY (ERCP);  Surgeon: Lucilla Lame,  ?             MD;  Location: [facility] ENDOSCOPY;  Service: Endoscopy;   ?             Laterality: N/A; ?02/05/2017: ESOPHAGOGASTRODUODENOSCOPY (EGD) WITH PROPOFOL; N/A ?    Comment:  Procedure: ESOPHAGOGASTRODUODENOSCOPY (EGD) WITH  ?             PROPOFOL;  Surgeon: Lollie Sails, MD;  Location:  ?             [facility] ENDOSCOPY;  Service: Endoscopy;  Laterality: N/A; ?12/27/2015: PERIPHERAL VASCULAR CATHETERIZATION; Bilateral ?    Comment:  Procedure: Lower Extremity Angiography;  Surgeon:  ?             Katha Cabal, MD;  Location: [place] CV LAB;   ?             Service: Cardiovascular;  Laterality: Bilateral; ?09/08/2020: PORTA CATH INSERTION; N/A ?    Comment:  Procedure: PORTA CATH INSERTION;  Surgeon: Algernon Huxley, ?             MD;  Location: [place] CV LAB;  Service:  ?  Cardiovascular;  Laterality: N/A; ?No date: TONSILLECTOMY ? ?BMI   ? Body Mass Index: 16.82 kg/m?  ?  ? ? Reproductive/Obstetrics ?negative OB ROS ? ?  ? ? ? ? ? ? ? ? ? ? ? ? ? ?  ?  ? ? ? ? ? ? ? ?Anesthesia Physical ? ?Anesthesia Plan ? ?ASA: 3 ? ?Anesthesia Plan: General  ? ?Post-op Pain Management:   ? ?Induction: Intravenous ? ?PONV Risk Score and Plan: Propofol infusion and TIVA ? ?Airway Management Planned: Natural Airway and Nasal Cannula ? ?Additional Equipment:  ? ?Intra-op Plan:  ? ?Post-operative Plan:  ? ?Informed Consent: I have reviewed the patients History and Physical, chart, labs and discussed the procedure including the risks, benefits and alternatives for the proposed anesthesia with the patient or authorized representative who has indicated his/her understanding and acceptance.  ? ? ? ?Dental Advisory Given ? ?Plan Discussed with: Anesthesiologist, CRNA and Surgeon ? ?Anesthesia Plan Comments: (Patient  consented for risks of anesthesia including but not limited to:  ?- adverse reactions to medications ?- risk of airway placement if required ?- damage to eyes, teeth, lips or other oral mucosa ?- nerve damage due to positioning  ?- sore throat or hoarseness ?- Damage to heart, brain, nerves, lungs, other parts of body or loss of life ? ?Patient voiced understanding.)  ? ? ? ? ? ? ?Anesthesia Quick Evaluation ? ?

## 2021-06-30 NOTE — Anesthesia Postprocedure Evaluation (Signed)
Anesthesia Post Note ? ?Patient: Shirley Barton ? ?Procedure(s) Performed: ENDOSCOPIC RETROGRADE CHOLANGIOPANCREATOGRAPHY (ERCP) ? ?Patient location during evaluation: Endoscopy ?Anesthesia Type: General ?Level of consciousness: awake and alert ?Pain management: pain level controlled ?Vital Signs Assessment: post-procedure vital signs reviewed and stable ?Respiratory status: spontaneous breathing, nonlabored ventilation and respiratory function stable ?Cardiovascular status: blood pressure returned to baseline and stable ?Postop Assessment: no apparent nausea or vomiting ?Anesthetic complications: no ? ? ?No notable events documented. ? ? ?Last Vitals:  ?Vitals:  ? 06/30/21 1517 06/30/21 2018  ?BP: (!) 141/94 (!) 150/79  ?Pulse: 84 95  ?Resp: 15 17  ?Temp: 36.6 ?C 36.6 ?C  ?SpO2: 99% 100%  ?  ?Last Pain:  ?Vitals:  ? 06/30/21 2018  ?TempSrc: Oral  ?PainSc:   ? ? ?  ?  ?  ?  ?  ?  ? ?[place] Ouch ? ? ? ? ?

## 2021-06-30 NOTE — Progress Notes (Signed)
?PROGRESS NOTE ? ? ? ?Shirley Barton  MRN:[id number] DOB: 09/21/1950 DOA: 06/28/2021 ?PCP: Roach, Chelsea A, MD  ?Outpatient Specialists: oncology ? ? ? ?Brief Narrative:  ? ?From admission h and p ?Shirley Barton is a 71 y.o. 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  Right upper quadrant pain about the same.  He has no nausea, vomiting or diarrhea, cough or shortness of breath. ?ED course: On arrival afebrile, tachycardic to 114, tachypneic to 23 with initially normal BP with subsequently went as low as 103/57 just prior to admission.  Blood work reveals WBC of 14,000 with lactic acid 1.5 hemoglobin 6.2 down from 7.4 on 3/27.  Lipase 67, with alk phos 827, up from 598 on 3/27 and total bilirubin 1.4 up from 1.0 on 3/27.  BUN/creatinine WNL urinalysis with 5 ketones small leukocytes negative nitrites and rare bacteria ?CT abdomen and pelvis showed the following ?  ?The emergency room provider spoke with a biliary specialist at [facility], Dr. Paul Jowell, at the recommendation of local GI, Dr. Toledo to request transfer however there was no bed availability.  Dr. Toledo subsequently agreed to consult with Dr. Wohl for placement of biliary stent here at this facility.  If unsuccessful, will reattempt transfer.  Hospitalist thus consulted for admission.  ? ?Assessment & Plan: ?  ?Principal Problem: ?  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  Sepsis (HCC) ?   Malignant neoplasm of head of pancreas (HCC) ?  Chronic obstructive pulmonary disease (HCC) ?  AAA (abdominal aortic aneurysm) ?  Malnutrition of moderate degree ? ?# Biliary obstruction with cholangitis ?Elevated temp with oncology, failed outpt levaquin, now with worsening ruq pain, leukocytosis, CT with intrahepatic bile duct dilatation worse than prior. Has been stented several times already. Hemodynamically stable.  ?- cont cefepime/flagyl ?- gi consulted, plan is ERCP today ?- npo, continue IVF ?- morphine prn, zofran prn ? ?# Sepsis ?By HR, leukocytosis. Normal lactate ?- fluids, abx as above ?- f/u 3/27 blood cultures and repeat set from 3/29 ngtd ? ?# Anemia ?Hgb 6.2 on arrival, this morning brb when wiping, says has had dark stools lately. Received 1 unit 3/30, hgb pending this morning.  ?- start iv ppi ?- GI made aware ?- f/u AM labs, transfuse if hgb less than 7 ? ?# Pancreatic cancer ?- oncology following ?- will message josh borders w/ onc palliative who has followed pt as outpt ?- PT advises HH PT ? ?# AAA  ?With mural thrombus ?- no longer taking apixaban ? ?# HTN ?Bp wnl ?- hold home antihypertensives for now ? ?# COPD ?Not exacerbated ?- home dulera ? ?# Malnutrition ?- cont home megace ?- rd consult ? ?# PAD ?S/p iliac stents.  ? ?DVT prophylaxis: SCDs for now pending procedure ?Code Status: full ?Family Communication: son Jonas updated telephonically 3/30 ? ?Level of care: Med-Surg ?Status is: Inpatient ?Remains inpatient appropriate because: severity of illness ? ? ? ?Consultants:  ?GI ? ?Procedures: ?none ? ?Antimicrobials:  ?  Cefepime/flagyl  ? ? ?Subjective: ?Ongoing ruq pain. Nausea, no vomiting. No diarrhea. No chest pain or cough. Brb when wiping this morning. ? ?Objective: ?Vitals:  ? 06/29/21 1630 06/30/21 0037 06/30/21 0510 06/30/21 0739  ?BP: (!) 155/83 126/82 (!) 156/93 (!) 141/81  ?Pulse: 90 93 98 96  ?Resp: 16  20 16  ?Temp: 97.8 ?F (36.6 ?C) 98.3 ?F (36.8 ?C) 98.6 ?F (37 ?C) 98.1 ?F  (36.7 ?C)  ?TempSrc:    Oral  ?SpO2: 100% 97% 98% 99%  ?Weight:      ?Height:      ? ?No intake or output data in the 24 hours ending 06/30/21 1108 ? ?Filed Weights  ? 06/28/21 1509  ?Weight: 42.2 kg  ? ? ?Examination: ? ?General exam: Appears calm and comfortable, chronically ill appearing ?Respiratory system: Clear to auscultation. Respiratory effort normal. ?Cardiovascular system: S1 & S2 heard, RRR. No JVD, murmurs, rubs, gallops or clicks. No pedal edema. ?Gastrointestinal system: Abdomen is moderately distended, ttp throughout greatest ruq, with guarding.  ?Central nervous system: Alert and oriented. No focal neurological deficits. ?Extremities: Symmetric 5 x 5 power. ?Skin: No rashes, lesions or ulcers ?Psychiatry: Judgement and insight appear normal. Mood & affect appropriate.  ? ? ? ?Data Reviewed: I have personally reviewed following labs and imaging studies ? ?CBC: ?Recent Labs  ?Lab 06/26/21 ?0850 06/28/21 ?1333 06/28/21 ?1508 06/29/21 ?0448  ?WBC 20.7* 19.3* 14.0* 9.3  ?NEUTROABS 17.9* 16.7* 12.2*  --   ?HGB 7.4* 7.1* 6.2* 7.4*  ?HCT 22.4* 21.9* 19.2* 22.7*  ?MCV 80.0 79.3* 80.3 82.5  ?PLT 374 513* 382 278  ? ?Basic Metabolic Panel: ?Recent Labs  ?Lab 06/26/21 ?0850 06/28/21 ?1333 06/28/21 ?1508 06/29/21 ?0448 06/30/21 ?1000  ?NA 134* 134* 136 136 136  ?K 3.6 3.5 3.6 3.6 3.6  ?CL 102 101 106 105 104  ?CO2 22 22 22 25 25  ?GLUCOSE 154* 116* 96 130* 85  ?BUN 19 16 15 11 9  ?CREATININE 0.69 0.64 0.54 0.55 0.51  ?CALCIUM 8.6* 8.7* 8.0* 7.8* 8.1*  ? ?GFR: ?Estimated Creatinine Clearance: 44.2 mL/min (by C-G formula based on SCr of 0.51 mg/dL). ?Liver Function Tests: ?Recent Labs  ?Lab 06/26/21 ?0850 06/28/21 ?1333 06/28/21 ?1508 06/29/21 ?0448 06/30/21 ?1000  ?AST 28 32 27 31 35  ?ALT 20 19 19 18 20  ?ALKPHOS 598* 924* 827* 878* 891*  ?BILITOT 1.0 1.3* 1.4* 1.8* 2.1*  ?PROT 7.4 7.2 6.6 5.9* 6.0*  ?ALBUMIN 2.4* 2.4* 2.1* 1.9* 2.0*  ? ?Recent Labs  ?Lab 06/28/21 ?1758  ?LIPASE 67*  ? ?No results for input(s):  AMMONIA in the last 168 hours. ?Coagulation Profile: ?Recent Labs  ?Lab 06/29/21 ?0448  ?INR 1.2  ? ?Cardiac Enzymes: ?No results for input(s): CKTOTAL, CKMB, CKMBINDEX, TROPONINI in the last 168 hours. ?BNP (last 3 results) ?No results for input(s): PROBNP in the last 8760 hours. ?HbA1C: ?No results for input(s): HGBA1C in the last 72 hours. ?CBG: ?No results for input(s): GLUCAP in the last 168 hours. ?Lipid Profile: ?No results for input(s): CHOL, HDL, LDLCALC, TRIG, CHOLHDL, LDLDIRECT in the last 72 hours. ?Thyroid Function Tests: ?No results for input(s): TSH, T4TOTAL, FREET4, T3FREE, THYROIDAB in the last 72 hours. ?Anemia Panel: ?No results for input(s): VITAMINB12, FOLATE, FERRITIN, TIBC, IRON, RETICCTPCT in the last 72 hours. ? ?Urine analysis: ?   ?Component Value Date/Time  ? COLORURINE AMBER (A) 06/28/2021 1735  ? APPEARANCEUR CLEAR (A) 06/28/2021 1735  ? APPEARANCEUR Clear 10/06/2015 1406  ? LABSPEC 1.020 06/28/2021   1735  ? PHURINE 5.0 06/28/2021 1735  ? GLUCOSEU NEGATIVE 06/28/2021 1735  ? [place] NEGATIVE 06/28/2021 1735  ? [place] NEGATIVE 06/28/2021 1735  ? BILIRUBINUR Negative 10/06/2015 1406  ? KETONESUR 5 (A) 06/28/2021 1735  ? PROTEINUR NEGATIVE 06/28/2021 1735  ? UROBILINOGEN 0.2 04/10/2012 0946  ? NITRITE NEGATIVE 06/28/2021 1735  ? LEUKOCYTESUR SMALL (A) 06/28/2021 1735  ? ?Sepsis Labs: ?_0 (procalcitonin:4,lacticidven:4) ? ?) ?Recent Results (from the past 240 hour(s))  ?Resp Panel by RT-PCR (Flu A&B, Covid) Nasopharyngeal Swab     Status: None  ? Collection Time: 06/28/21  3:08 PM  ? Specimen: Nasopharyngeal Swab; Nasopharyngeal(NP) swabs in vial transport medium  ?Result Value Ref Range Status  ? SARS Coronavirus 2 by RT PCR NEGATIVE NEGATIVE Final  ?  Comment: (NOTE) ?SARS-CoV-2 target nucleic acids are NOT DETECTED. ? ?The SARS-CoV-2 RNA is generally detectable in upper respiratory ?specimens during the acute phase of infection. The lowest ?concentration of SARS-CoV-2 viral  copies this assay can detect is ?138 copies/mL. A negative result does not preclude SARS-Cov-2 ?infection and should not be used as the sole basis for treatment or ?other patient management decisions. A

## 2021-07-01 ENCOUNTER — Inpatient Hospital Stay: Payer: Medicare HMO

## 2021-07-01 DIAGNOSIS — C250 Malignant neoplasm of head of pancreas: Secondary | ICD-10-CM

## 2021-07-01 DIAGNOSIS — I7143 Infrarenal abdominal aortic aneurysm, without rupture: Secondary | ICD-10-CM

## 2021-07-01 DIAGNOSIS — E440 Moderate protein-calorie malnutrition: Secondary | ICD-10-CM

## 2021-07-01 DIAGNOSIS — A419 Sepsis, unspecified organism: Secondary | ICD-10-CM

## 2021-07-01 DIAGNOSIS — K831 Obstruction of bile duct: Secondary | ICD-10-CM | POA: Diagnosis not present

## 2021-07-01 LAB — COMPREHENSIVE METABOLIC PANEL
ALT: 18 U/L (ref 0–44)
AST: 31 U/L (ref 15–41)
Albumin: 1.9 g/dL — ABNORMAL LOW (ref 3.5–5.0)
Alkaline Phosphatase: 770 U/L — ABNORMAL HIGH (ref 38–126)
Anion gap: 11 (ref 5–15)
BUN: 10 mg/dL (ref 8–23)
CO2: 24 mmol/L (ref 22–32)
Calcium: 8.2 mg/dL — ABNORMAL LOW (ref 8.9–10.3)
Chloride: 103 mmol/L (ref 98–111)
Creatinine, Ser: 0.52 mg/dL (ref 0.44–1.00)
GFR, Estimated: >60 mL/min (ref >60)
Glucose, Bld: 96 mg/dL (ref 70–99)
Potassium: 3.5 mmol/L (ref 3.5–5.1)
Sodium: 138 mmol/L (ref 135–145)
Total Bilirubin: 1.5 mg/dL — ABNORMAL HIGH (ref 0.3–1.2)
Total Protein: 6.2 g/dL — ABNORMAL LOW (ref 6.5–8.1)

## 2021-07-01 LAB — CBC
HCT: 24.4 % — ABNORMAL LOW (ref 36.0–46.0)
Hemoglobin: 8 g/dL — ABNORMAL LOW (ref 12.0–15.0)
MCH: 26.9 pg (ref 26.0–34.0)
MCHC: 32.8 g/dL (ref 30.0–36.0)
MCV: 82.2 fL (ref 80.0–100.0)
Platelets: 338 10*3/uL (ref 150–400)
RBC: 2.97 MIL/uL — ABNORMAL LOW (ref 3.87–5.11)
RDW: 17 % — ABNORMAL HIGH (ref 11.5–15.5)
WBC: 11.9 10*3/uL — ABNORMAL HIGH (ref 4.0–10.5)
nRBC: 0 % (ref 0.0–0.2)

## 2021-07-01 LAB — CULTURE, BLOOD (ROUTINE X 2)
Culture: NO GROWTH
Culture: NO GROWTH
Special Requests: ADEQUATE
Special Requests: ADEQUATE

## 2021-07-01 IMAGING — DX DG ABDOMEN 1V
2 series · 2 of 2 positions shown · non-contrast
Comparison: [DATE]

CLINICAL DATA: Biliary obstruction due to pancreas cancer.

EXAM:
ABDOMEN - 1 VIEW

[abdomen supine (1 of 2)]
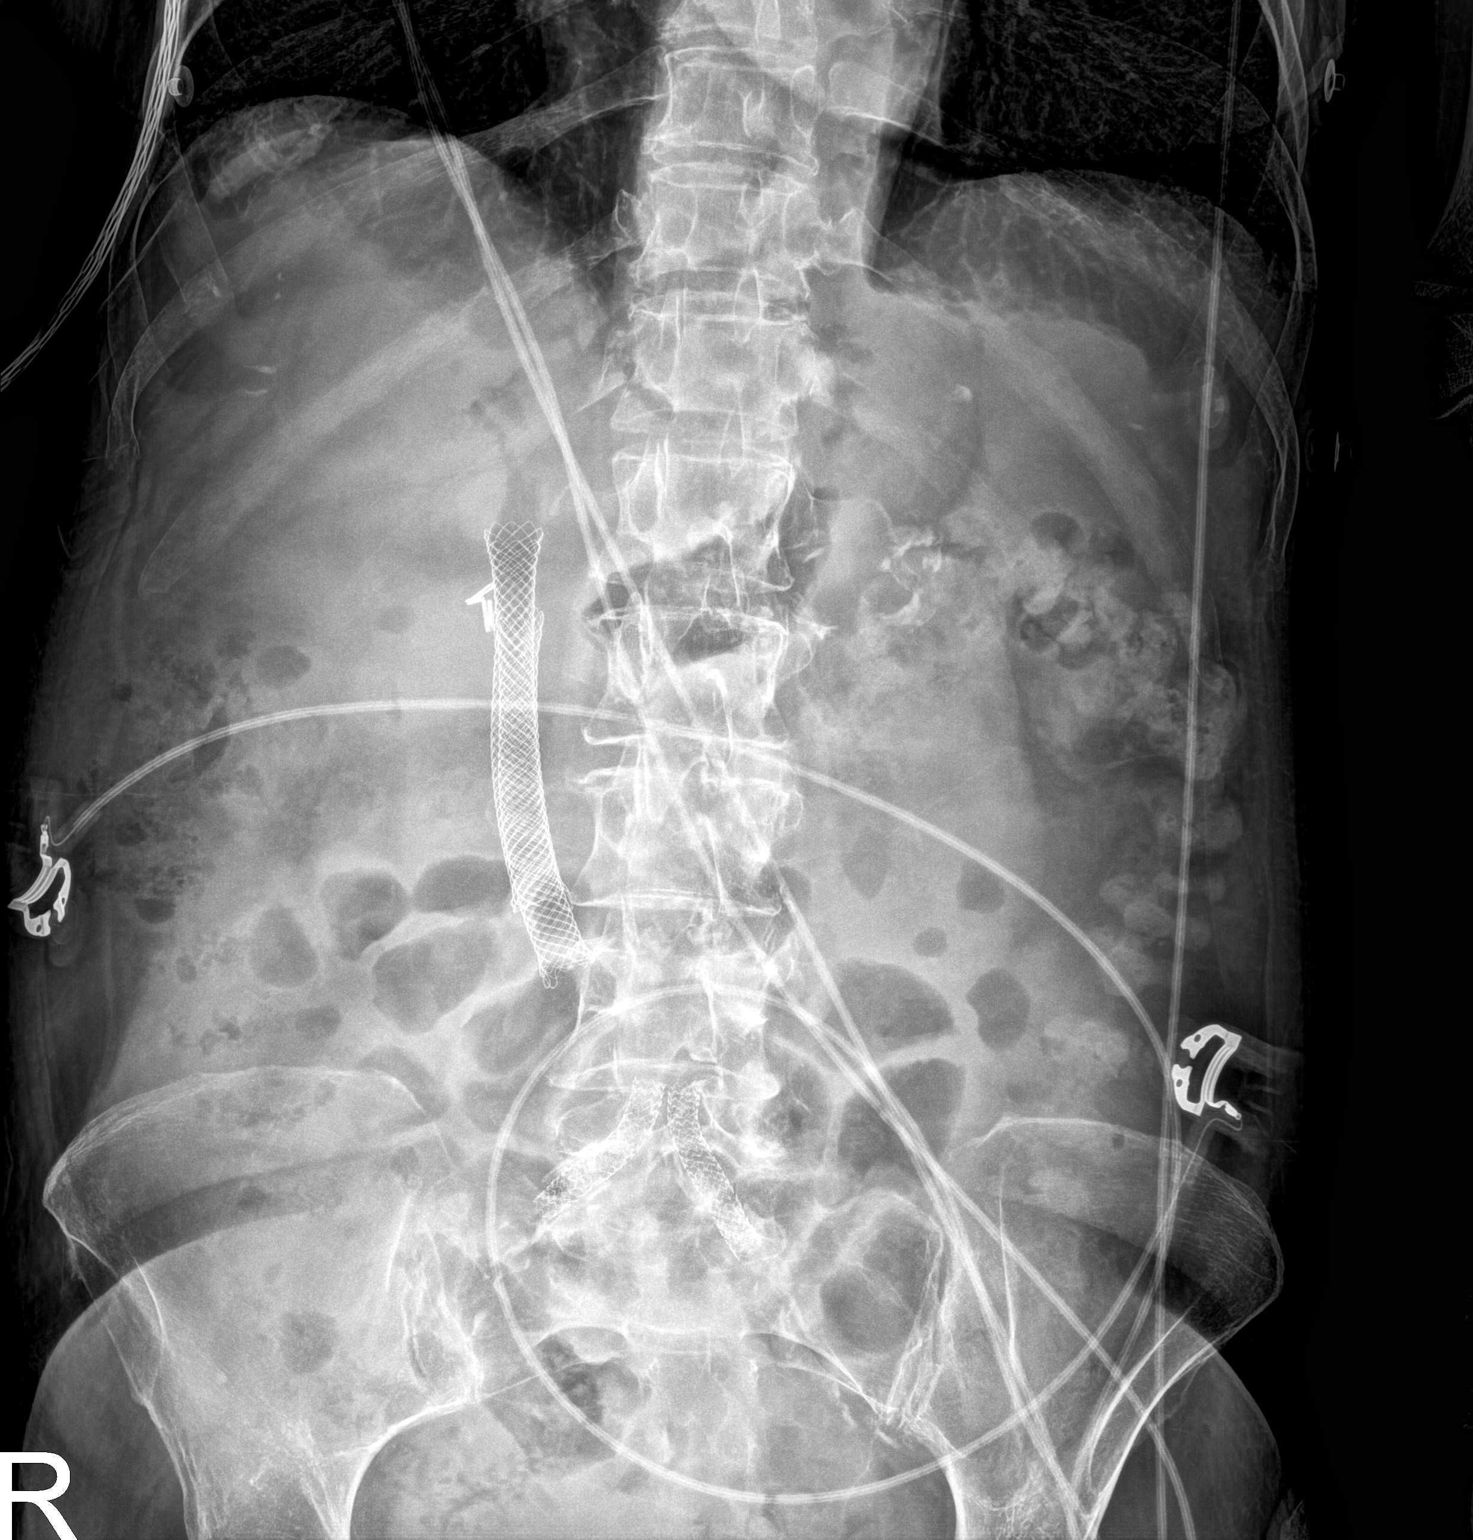

[abdomen supine (2 of 2)]
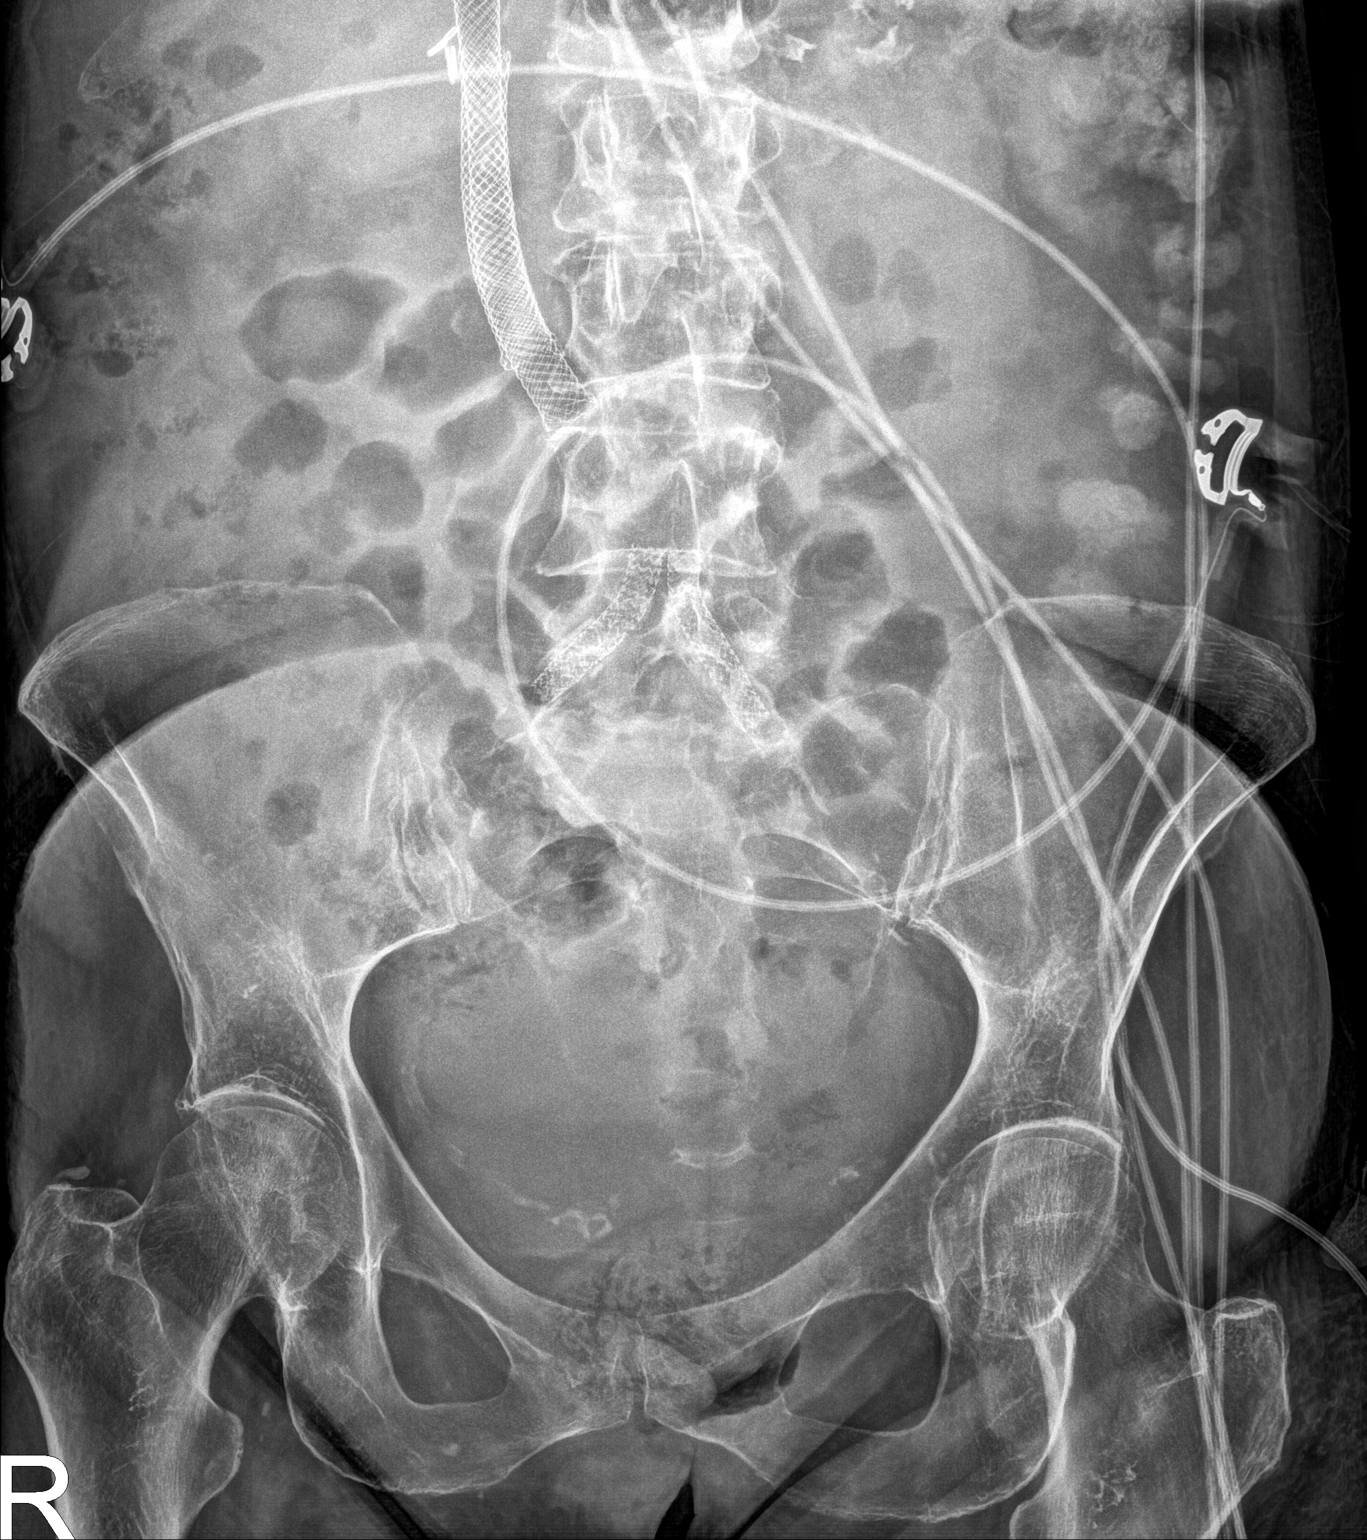

[2 of 2 positions shown; findings below may reference images not displayed]

FINDINGS: Common bile duct and left hepatic duct stents are again noted.
Pneumobilia within the left hepatic lobe is again noted as seen on
CT from [DATE]. Status post proximal iliac artery stenting.
Bowel gas pattern appears nonobstructed. No dilated loops of large
or small bowel. Enteric contrast material is identified within the
distal transverse colon and descending colon.
IMPRESSION: 1. Nonobstructive bowel gas pattern.
2. Pneumobilia within the left hepatic lobe is again noted.

## 2021-07-01 NOTE — Progress Notes (Signed)
PT Cancellation Note ? ?Patient Details ?Name: Shirley Barton ?MRN: [id number] ?DOB: Feb 28, 1951 ? ? ?Cancelled Treatment:    Reason Eval/Treat Not Completed: Pain limiting ability to participate.  Pt declined to get up due to abd pain, with nursing at the time.  Retry at another time. ? ? ?Ramond Dial ?07/01/2021, 11:15 AM ? ?Mee Hives, PT PhD ?Acute Rehab Dept. Number: [facility] [phone] and [place] [phone] ? ?

## 2021-07-01 NOTE — Plan of Care (Signed)
?  Problem: Clinical Measurements: Goal: Diagnostic test results will improve Outcome: Progressing   Problem: Pain Managment: Goal: General experience of comfort will improve Outcome: Progressing   

## 2021-07-01 NOTE — Progress Notes (Signed)
? ?GI Inpatient Follow-up Note ? ?Subjective: ? ?Patient seen in follow-up for biliary obstruction with cholangitis and sepsis. No acute events overnight. She is s/p ERCP yesterday afternoon with Dr. Allen Norris where plastic stent was removed and a longer metal stent was placed across the previously placed metal stent with good drainage. The tumor appeared to be involving the duodenum. No active bleeding seen during procedure. Labs today showed stable WBC at 11.9K with hemoglobin down to 8.0. Per nursing staff, no bowel movements overnight or this morning with no rectal bleeding. Liver enzymes trending down from yesterday alk phos 891 and tbili 2.1 to now 770 and 1.5, respectively. Patient does complain of some abdominal distention and pressure which started this morning. She denies any nausea or vomiting. She is wanting to try to eat something this morning.  ? ? ?Scheduled Inpatient Medications:  ? (feeding supplement) PROSource Plus  30 mL Oral TID BM  ? Chlorhexidine Gluconate Cloth  6 each Topical Daily  ? megestrol  625 mg Oral Daily  ? mometasone-formoterol  2 puff Inhalation BID  ? multivitamin with minerals  1 tablet Oral Daily  ? pantoprazole (PROTONIX) IV  40 mg Intravenous Q12H  ? ? ?Continuous Inpatient Infusions: ?  ? sodium chloride    ? ceFEPime (MAXIPIME) IV 2 g (07/01/21 0826)  ? lactated ringers 100 mL/hr at 07/01/21 0700  ? metronidazole Stopped (07/01/21 0029)  ? ? ?PRN Inpatient Medications:  ?HYDROmorphone (DILAUDID) injection, morphine injection, ondansetron **OR** ondansetron (ZOFRAN) IV ? ?Review of Systems: ?Constitutional: Weight is stable.  ?Eyes: No changes in vision. ?ENT: No oral lesions, sore throat.  ?GI: see HPI.  ?Heme/Lymph: No easy bruising.  ?CV: No chest pain.  ?GU: No hematuria.  ?Integumentary: No rashes.  ?Neuro: No headaches.  ?Psych: No depression/anxiety.  ?Endocrine: No heat/cold intolerance.  ?Allergic/Immunologic: No urticaria.  ?Resp: No cough, SOB.  ?Musculoskeletal: No  joint swelling.  ?  ?Physical Examination: ?BP (!) 158/79 (BP Location: Left Arm)   Pulse 100   Temp 98.1 ?F (36.7 ?C) (Oral)   Resp 20   Ht 5' 3.5" (1.613 m)   Wt 42.2 kg   LMP 01/03/1995 (Approximate)   SpO2 98%   BMI 16.22 kg/m?  ?Gen: NAD, alert and oriented x 4 ?HEENT: PEERLA, EOMI, ?Neck: supple, no JVD or thyromegaly ?Chest: CTA bilaterally, no wheezes, crackles, or other adventitious sounds ?CV: RRR, no m/g/c/r ?Abd: mildly distended, + bowel sounds present, tender to deep palpation diffusely,no HSM, guarding, ridigity, or rebound tenderness, no fluid wave  ?Ext: no edema, well perfused with 2+ pulses, ?Skin: no rash or lesions noted ?Lymph: no LAD ? ?Data: ?Lab Results  ?Component Value Date  ? WBC 11.9 (H) 07/01/2021  ? HGB 8.0 (L) 07/01/2021  ? HCT 24.4 (L) 07/01/2021  ? MCV 82.2 07/01/2021  ? PLT 338 07/01/2021  ? ?Recent Labs  ?Lab 06/29/21 ?0448 06/30/21 ?1109 07/01/21 ?5631  ?HGB 7.4* 8.7* 8.0*  ? ?Lab Results  ?Component Value Date  ? NA 138 07/01/2021  ? K 3.5 07/01/2021  ? CL 103 07/01/2021  ? CO2 24 07/01/2021  ? BUN 10 07/01/2021  ? CREATININE 0.52 07/01/2021  ? ?Lab Results  ?Component Value Date  ? ALT 18 07/01/2021  ? AST 31 07/01/2021  ? ALKPHOS 770 (H) 07/01/2021  ? BILITOT 1.5 (H) 07/01/2021  ? ?Recent Labs  ?Lab 06/29/21 ?0448  ?INR 1.2  ? ?ERCP 06/30/21: ?Impression:             ?-  Two stents from the common bile duct were seen in the major papilla. ?- One stent was removed from the common bile duct. ?- One covered metal stent was placed into the common bile duct. ?- There was a lot of inflammation in the antrum and duodenum without any fresh blood or ulcers seen. ?- Tumor invasion into the duodenum cant be r/o. ? ? ?Assessment/Plan: ? 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 Imaging concerning for worsening biliary obstruction with increased alkaline phosphatase and total bilirubin c/w cholestasis. GI consulted for further evaluation and management. She is s/p ERCP yesterday afternoon with Dr. Allen Norris where plastic stent was removed and a longer metal stent was placed across the previously placed metal stent with good drainage.  ? ?  ?Sepsis 2/2 acute cholangitis with biliary obstruction - s/p ERCP yesterday with new metal stent placed, cholestasis improving with decrease in alk phos and tbili  ?  ?Anemia - stable with no overt GI bleeding, ERCP yesterday with no signs of bleeding in esophagus, stomach, or proximal small intestine. Hemoglobin 8.0 this morning. She received 1 unit pRBCs 3/30.  ?  ?Pancreatic cancer - palliative care following  ?  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  ?Malnutrition ?  ?COPD - stable  ? ?Abdominal distention - DDx includes constipation, post-ERCP pancreatitis, partial small bowel obstruction, etc ? ? ?Recommendations: ? ?- Reviewed ERCP procedure report and findings with patient this morning  ?- Agree with abdominal radiograph to rule out free air, large stool burden, pSBO, etc ?- Clinically, patient is non-toxic appearing and is not exhibiting signs of acute abdomen. If x-ray is nondiagnostic, low threshold for repeat cross-sectional imaging.  ?- Diet advance as tolerated ?- Continue to monitor serial H&H. Transfuse for Hgb <7.0.  ?- No signs of overt GI bleed. Continue to monitor for signs of bleeding.  ?- Continue to monitor daily LFTs ?- Appreciate palliative care involvement given overall poor prognosis  ?- Continue antibiotics. Blood cultures negative x 2 days.  ?- Following along with you ?- Continue care of comorbidities per primary team ? ?Please call with questions or concerns. ? ? ?Octavia Bruckner, PA-C ?[facility]  Gastroenterology ?[phone] ?[phone] (Cell) ? ? ? ?

## 2021-07-01 NOTE — Progress Notes (Signed)
?PROGRESS NOTE ? ? ? ?Shirley Barton  AGT:[id number] DOB: [birth year] DOA: 06/28/2021 ?PCP: Erma Heritage, MD  ? ?Brief Narrative:  ?  ?From admission h and p ?Shirley Barton is a 71 y.o. 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  Right upper quadrant pain about the same.  He has no nausea, vomiting or diarrhea, cough or shortness of breath. ?ED course: On arrival afebrile, tachycardic to 114, tachypneic to 23 with initially normal BP with subsequently went as low as 103/57 just prior to admission.  Blood work reveals WBC of 14,000 with lactic acid 1.5 hemoglobin 6.2 down from 7.4 on 3/27.  Lipase 67, with alk phos 827, up from 598 on 3/27 and total bilirubin 1.4 up from 1.0 on 3/27.  BUN/creatinine WNL urinalysis with 5 ketones small leukocytes negative nitrites and rare bacteria ?CT abdomen and pelvis showed the following ?  ?The emergency room provider spoke with a biliary specialist at [facility], Dr. Jola Schmidt, at the recommendation of local GI, Dr. Alice Reichert to request transfer however there was no bed availability.  Dr. Alice Reichert subsequently agreed to consult with Dr. Allen Norris for placement of biliary stent here at this facility.   ? ?Pt is now s/p biliary tube placement via ercp and has tolerated the procedure well ? ?Assessment & Plan: ?  ?Principal Problem: ?  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) ?Active Problems: ?  Sepsis ([place]) ?  Malignant neoplasm of head of  pancreas ([place]) ?  Chronic obstructive pulmonary disease (HCC) ?  AAA (abdominal aortic aneurysm) ([place]) ?  Malnutrition of moderate degree ?  Palliative care encounter ? ? ?# Biliary obstruction with cholangitis ?Elevated temp with oncology, failed outpt levaquin, now with worsening ruq pain, leukocytosis, CT with intrahepatic bile duct dilatation worse than prior. Has been stented several times already. Hemodynamically stable.  ?- cont cefepime/flagyl ?- gi consulted, POD 1 ERCP ?- Diet as tolerated ?- morphine prn, zofran prn ?  ?# Sepsis ?By HR, leukocytosis. Normal lactate ?- fluids, abx as above ?- improving ?  ?# Anemia ?Hgb 6.2 on arrival, this morning brb when wiping, says has had dark stools lately. Received 1 unit 3/30, hgb 8.0 this morning.  ?- c/w iv ppi ?- GI aware ?- f/u AM labs, transfuse if hgb less than 7 ?  ?# Pancreatic cancer ?- oncology following ?- previously messaged josh borders w/ onc palliative who has followed pt as outpt ?- PT advises [place] ?  ?# AAA  ?With mural thrombus ?- no longer taking apixaban ?  ?# HTN ?Bp wnl ?- hold home antihypertensives for now-bp varriable ?  ?# COPD ?Not exacerbated ?- home dulera ?  ?# Malnutrition ?- cont home megace ?- rd consult ?  ?# PAD ?S/p iliac stents.  ? ?DVT prophylaxis: SCD/Compression stockings  ?Code Status: full ? ?  ?Code Status Orders  ?(From admission, onward)  ?  ? ? ?  ? ?  Start     Ordered  ? 06/28/21 2158  Full code  Continuous       ? 06/28/21 2200  ? ?  ?  ? ?  ? ?Code Status History   ? ? Date Active Date Inactive Code Status Order ID Comments User Context  ? 11/28/2020 2031 11/29/2020 1906 Full Code [id number]  Criss Alvine, DO ED  ? 08/05/2020 0757 08/07/2020 2105 Full Code [id number]  Ivor Costa, MD Inpatient  ? 12/27/2015 1312 12/27/2015 1849 Full Code [id number]  Schnier, Dolores Lory, MD Inpatient  ? ?  ? ?Family Communication: called son, answered all questions  ?Disposition Plan:   pt not stable for discharge, requiring iv pain meda and  abx ?Consults called: None ?Admission status: Inpatient ? ? ?Consultants:  ?Gi, oncology ? ?Procedures:  ?DG Chest 2 View ? ?Result Date: 06/28/2021 ?CLINICAL DATA:  Tachycardia tachypnea, evaluate infiltrate. EXAM: CHEST - 2 VIEW COMPARISON:  Chest radiograph dated December 08, 2020 FINDINGS: The heart size and mediastinal contours are within normal limits. Atherosclerotic calcification of the aortic arch. Hyperinflated lungs without evidence of focal consolidation or pleural effusion. The visualized skeletal structures are unremarkable. Right IJ access Port-A-Cath. IMPRESSION: No active cardiopulmonary disease. Electronically Signed   By: Keane Police D.O.   On: 06/28/2021 17:22  ? ?DG Abd 1 View ? ?Result Date: 07/01/2021 ?CLINICAL DATA:  Biliary obstruction due to pancreas cancer. EXAM: ABDOMEN - 1 VIEW COMPARISON:  06/28/2021 FINDINGS: Common bile duct and left hepatic duct stents are again noted. Pneumobilia within the left hepatic lobe is again noted as seen on CT from 06/28/2021. Status post proximal iliac artery stenting. Bowel gas pattern appears nonobstructed. No dilated loops of large or small bowel. Enteric contrast material is identified within the distal transverse colon and descending colon. IMPRESSION: 1. Nonobstructive bowel gas pattern. 2. Pneumobilia within the left hepatic lobe is again noted. Electronically Signed   By: Kerby Moors M.D.   On: 07/01/2021 11:08  ? ?CT ABDOMEN PELVIS W CONTRAST ? ?Result Date: 06/28/2021 ?CLINICAL DATA:  Pancreatic cancer, increased right upper quadrant pain, evaluate for biliary obstruction, small-bowel obstruction * Tracking Code: BO * EXAM: CT ABDOMEN AND PELVIS WITH CONTRAST TECHNIQUE: Multidetector CT imaging of the abdomen and pelvis was performed using the standard protocol following bolus administration of intravenous contrast. RADIATION DOSE REDUCTION: This exam was performed according to the departmental dose-optimization program which includes automated  exposure control, adjustment of the mA and/or kV according to patient size and/or use of iterative reconstruction technique. CONTRAST:  75mL OMNIPAQUE IOHEXOL 300 MG/ML  SOLN COMPARISON:  CT abdomen pelvis, 05/12/2021 MR abdomen, 06/02/2021 FINDINGS: Lower chest: No acute abnormality.  Emphysema. Hepatobiliary: Redemonstrated large-bore left hepatic and common bile duct stent. There has been interval placement of an additional stent catheter within this larger stent. Severe intrahepatic biliary ductal dilatation and post stenting pneumobilia, increased compared to prior examination. Subtle, amorphous heterogeneous hypoenhancement throughout the liver parenchyma, particularly conspicuous in the right lobe (series 2, image 17). Pancreas: Unchanged hypodense mass of the pancreatic head, with atrophy and pancreatic ductal dilatation distally. Degree of pancreatic ductal dilatation has increased compared to prior examination, now measuring up to 0.9 cm in caliber (series 2, image 27). The central portal vein is now effaced by pancreatic mass and adjacent portacaval soft tissue (series 2, image 25). Spleen: Normal in size without significant abnormality. Adrenals/Urinary Tract: Adrenal glands are unremarkable. Kidneys are normal, without renal calculi, solid lesion, or hydronephrosis. Bladder is unremarkable. Stomach/Bowel: Stomach is within normal limits. Appendix appears normal. Wall thickening  and edema of the cecal base (series 2, image 61). Vascular/Lymphatic: Aortic atherosclerosis. Unchanged infrarenal abdominal aortic aneurysm with eccentric mural thrombus, measuring 3.9 x 3.5 cm in caliber. Bilateral common iliac artery stents. Newly prominent, indistinct and matted appearing retroperitoneal lymph nodes, largest retroaortic node measuring 1.1 x 1.1 cm (series 2, image 32). Reproductive: No mass or other significant abnormality. Prominent bilateral ovarian and uterine varices Other: No abdominal wall hernia or  abnormality. Small volume ascites throughout the abdomen and pelvis, slightly increased compared to prior examination. Musculoskeletal: No acute or significant osseous findings. IMPRESSION: 1. Redemonstrated larg

## 2021-07-02 DIAGNOSIS — C250 Malignant neoplasm of head of pancreas: Secondary | ICD-10-CM | POA: Diagnosis not present

## 2021-07-02 DIAGNOSIS — K831 Obstruction of bile duct: Secondary | ICD-10-CM | POA: Diagnosis not present

## 2021-07-02 DIAGNOSIS — E440 Moderate protein-calorie malnutrition: Secondary | ICD-10-CM | POA: Diagnosis not present

## 2021-07-02 DIAGNOSIS — I7143 Infrarenal abdominal aortic aneurysm, without rupture: Secondary | ICD-10-CM | POA: Diagnosis not present

## 2021-07-02 LAB — COMPREHENSIVE METABOLIC PANEL
ALT: 17 U/L (ref 0–44)
AST: 27 U/L (ref 15–41)
Albumin: 2 g/dL — ABNORMAL LOW (ref 3.5–5.0)
Alkaline Phosphatase: 806 U/L — ABNORMAL HIGH (ref 38–126)
Anion gap: 5 (ref 5–15)
BUN: 12 mg/dL (ref 8–23)
CO2: 27 mmol/L (ref 22–32)
Calcium: 7.9 mg/dL — ABNORMAL LOW (ref 8.9–10.3)
Chloride: 104 mmol/L (ref 98–111)
Creatinine, Ser: 0.37 mg/dL — ABNORMAL LOW (ref 0.44–1.00)
GFR, Estimated: >60 mL/min (ref >60)
Glucose, Bld: 104 mg/dL — ABNORMAL HIGH (ref 70–99)
Potassium: 3.8 mmol/L (ref 3.5–5.1)
Sodium: 136 mmol/L (ref 135–145)
Total Bilirubin: 1.3 mg/dL — ABNORMAL HIGH (ref 0.3–1.2)
Total Protein: 6.1 g/dL — ABNORMAL LOW (ref 6.5–8.1)

## 2021-07-02 LAB — CBC
HCT: 26.4 % — ABNORMAL LOW (ref 36.0–46.0)
Hemoglobin: 8.5 g/dL — ABNORMAL LOW (ref 12.0–15.0)
MCH: 26.9 pg (ref 26.0–34.0)
MCHC: 32.2 g/dL (ref 30.0–36.0)
MCV: 83.5 fL (ref 80.0–100.0)
Platelets: 366 10*3/uL (ref 150–400)
RBC: 3.16 MIL/uL — ABNORMAL LOW (ref 3.87–5.11)
RDW: 17.5 % — ABNORMAL HIGH (ref 11.5–15.5)
WBC: 12.8 10*3/uL — ABNORMAL HIGH (ref 4.0–10.5)
nRBC: 0 % (ref 0.0–0.2)

## 2021-07-02 MED ORDER — CALCIUM CARBONATE ANTACID 500 MG PO CHEW
200.0000 mg | CHEWABLE_TABLET | Freq: Once | ORAL | Status: AC
Start: 2021-07-02 — End: 2021-07-02
  Administered 2021-07-02: 200 mg via ORAL
  Filled 2021-07-02: qty 1

## 2021-07-02 NOTE — Progress Notes (Signed)
?PROGRESS NOTE ? ? ? ?KENDELL GAMMON  BSJ:[id number] DOB: 1950/12/03 DOA: 06/28/2021 ?PCP: Erma Heritage, MD  ? ?Brief Narrative:  ?From admission h and p ?Shirley Barton is a 71 y.o. 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  Right upper quadrant pain about the same.  He has no nausea, vomiting or diarrhea, cough or shortness of breath. ?ED course: On arrival afebrile, tachycardic to 114, tachypneic to 23 with initially normal BP with subsequently went as low as 103/57 just prior to admission.  Blood work reveals WBC of 14,000 with lactic acid 1.5 hemoglobin 6.2 down from 7.4 on 3/27.  Lipase 67, with alk phos 827, up from 598 on 3/27 and total bilirubin 1.4 up from 1.0 on 3/27.  BUN/creatinine WNL urinalysis with 5 ketones small leukocytes negative nitrites and rare bacteria ?CT abdomen and pelvis showed the following ?  ?The emergency room provider spoke with a biliary specialist at [facility], Dr. Jola Schmidt, at the recommendation of local GI, Dr. Alice Reichert to request transfer however there was no bed availability.  Dr. Alice Reichert subsequently agreed to consult with Dr. Allen Norris for placement of biliary stent here at this facility.   ?  ?Pt is now s/p biliary tube placement via ercp and has tolerated the procedure well ? ? ?Assessment & Plan: ?  ?Principal Problem: ?  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  Sepsis ([place]) ?  Malignant neoplasm of head of  pancreas ([place]) ?  Chronic obstructive pulmonary disease (HCC) ?  AAA (abdominal aortic aneurysm) ([place]) ?  Malnutrition of moderate degree ?  Palliative care encounter ? ? ?# Biliary obstruction with cholangitis ?Elevated temp with oncology, failed outpt levaquin, now with worsening ruq pain, leukocytosis, CT with intrahepatic bile duct dilatation worse than prior. Has been stented several times already. Hemodynamically stable.  ?- cont cefepime/flagyl-anticipate narrowing in next 24 hr ?- gi consulted, POD 2 ERCP, t bili continue to decline post stent ?- Diet as tolerated ?- morphine prn, zofran prn ?  ?# Sepsis ?By HR, leukocytosis. Normal lactate ?- fluids, abx as above ?- improving ?  ?# Anemia ?Hgb 6.2 on arrival, this morning brb when wiping, says has had dark stools lately. Received 1 unit 3/30, hgb 8.5 this morning, up from 8.0 ?- c/w iv ppi ?- GI aware-no evid of bleeding ?- f/u AM labs, transfuse if hgb less than 7 ?  ?# Pancreatic cancer ?- oncology following ?- previously messaged josh borders w/ onc palliative who has followed pt as outpt ?- PT advises [facility] ?  ?# AAA  ?With mural thrombus ?- no longer taking apixaban ?  ?# HTN ?- Bp wnl ?- hold home antihypertensives for now-bp varriable ?  ?# COPD ?- Not exacerbated ?- home dulera ?  ?# Malnutrition ?- cont home megace ?- rd consult ?  ?# PAD ?- S/p iliac stents.  ?  ?DVT prophylaxis: SCD/Compression stockings  ?Code Status: full  ? ?  ?Code Status Orders  ?(From  admission, onward)  ?  ? ? ?  ? ?  Start     Ordered  ? 06/28/21 2158  Full code  Continuous       ? 06/28/21 2200  ? ?  ?  ? ?  ? ?Code Status History   ? ? Date Active Date Inactive Code Status Order ID Comments User Context  ? 11/28/2020 2031 11/29/2020 1906 Full Code [id number]  Criss Alvine, DO ED  ? 08/05/2020 0757 08/07/2020 2105 Full Code [id number]  Ivor Costa, MD Inpatient  ? 12/27/2015 1312 12/27/2015 1849 Full Code [id number]  Schnier, Dolores Lory, MD Inpatient  ? ?  ? ?Family Communication:  discussed with son on Saturday, pt on Sunday, f/up Monday of pt continues to improve  ?Disposition Plan:   not stable for discharge, still requiring iv abx ?Consults called: None ?Admission status: Inpatient ? ? ?Consultants:  ?Gi oncology ? ?Procedures:  ?DG Chest 2 View ? ?Result Date: 06/28/2021 ?CLINICAL DATA:  Tachycardia tachypnea, evaluate infiltrate. EXAM: CHEST - 2 VIEW COMPARISON:  Chest radiograph dated December 08, 2020 FINDINGS: The heart size and mediastinal contours are within normal limits. Atherosclerotic calcification of the aortic arch. Hyperinflated lungs without evidence of focal consolidation or pleural effusion. The visualized skeletal structures are unremarkable. Right IJ access Port-A-Cath. IMPRESSION: No active cardiopulmonary disease. Electronically Signed   By: Keane Police D.O.   On: 06/28/2021 17:22  ? ?DG Abd 1 View ? ?Result Date: 07/01/2021 ?CLINICAL DATA:  Biliary obstruction due to pancreas cancer. EXAM: ABDOMEN - 1 VIEW COMPARISON:  06/28/2021 FINDINGS: Common bile duct and left hepatic duct stents are again noted. Pneumobilia within the left hepatic lobe is again noted as seen on CT from 06/28/2021. Status post proximal iliac artery stenting. Bowel gas pattern appears nonobstructed. No dilated loops of large or small bowel. Enteric contrast material is identified within the distal transverse colon and descending colon. IMPRESSION: 1. Nonobstructive bowel gas pattern. 2. Pneumobilia within the left hepatic lobe is again noted. Electronically Signed   By: Kerby Moors M.D.   On: 07/01/2021 11:08  ? ?CT ABDOMEN PELVIS W CONTRAST ? ?Result Date: 06/28/2021 ?CLINICAL DATA:  Pancreatic cancer, increased right upper quadrant pain, evaluate for biliary obstruction, small-bowel obstruction * Tracking Code: BO * EXAM: CT ABDOMEN AND PELVIS WITH CONTRAST TECHNIQUE: Multidetector CT imaging of the abdomen and pelvis was performed using the standard protocol following bolus administration of  intravenous contrast. RADIATION DOSE REDUCTION: This exam was performed according to the departmental dose-optimization program which includes automated exposure control, adjustment of the mA and/or kV according to patient size and/or use of iterative reconstruction technique. CONTRAST:  29m OMNIPAQUE IOHEXOL 300 MG/ML  SOLN COMPARISON:  CT abdomen pelvis, 05/12/2021 MR abdomen, 06/02/2021 FINDINGS: Lower chest: No acute abnormality.  Emphysema. Hepatobiliary: Redemonstrated large-bore left hepatic and common bile duct stent. There has been interval placement of an additional stent catheter within this larger stent. Severe intrahepatic biliary ductal dilatation and post stenting pneumobilia, increased compared to prior examination. Subtle, amorphous heterogeneous hypoenhancement throughout the liver parenchyma, particularly conspicuous in the right lobe (series 2, image 17). Pancreas: Unchanged hypodense mass of the pancreatic head, with atrophy and pancreatic ductal dilatation distally. Degree of pancreatic ductal dilatation has increased compared to prior examination, now measuring up to 0.9 cm in caliber (series 2, image 27). The central portal vein is now effaced by pancreatic mass and adjacent portacaval soft tissue (series 2, image 25). Spleen: Normal in size without significant abnormality.  Adrenals/Urinary Tract: Adrenal glands are unremarkable. Kidneys are normal, without renal calculi, solid lesion, or hydronephrosis. Bladder is unremarkable. Stomach/Bowel: Stomach is within normal limits. Appendix appears normal. Wall thickening and edema of the cecal base (series 2, image 61). Vascular/Lymphatic: Aortic atherosclerosis. Unchanged infrarenal abdominal aortic aneurysm with eccentric mural thrombus, measuring 3.9 x 3.5 cm in caliber. Bilateral common iliac artery stents. Newly prominent, indistinct and matted appearing retroperitoneal lymph nodes, largest retroaortic node measuring 1.1 x 1.1 cm (series 2,  image 32). Reproductive: No mass or other significant abnormality. Prominent bilateral ovarian and uterine varices Other: No abdominal wall hernia or abnormality. Small volume ascites throughout the abdomen and

## 2021-07-02 NOTE — Progress Notes (Signed)
? ?GI Inpatient Follow-up Note ? ?Subjective: ? ?Patient seen in follow-up for biliary obstruction with cholangitis and sepsis. No acute events overnight. She denies any new acute complaints. She denies any abdominal pain, nausea, vomiting, altered mental status, or rectal bleeding. Hemoglobin is stable. She is tolerating regular diet.  ? ?Scheduled Inpatient Medications:  ? (feeding supplement) PROSource Plus  30 mL Oral TID BM  ? Chlorhexidine Gluconate Cloth  6 each Topical Daily  ? megestrol  625 mg Oral Daily  ? mometasone-formoterol  2 puff Inhalation BID  ? multivitamin with minerals  1 tablet Oral Daily  ? pantoprazole (PROTONIX) IV  40 mg Intravenous Q12H  ? ? ?Continuous Inpatient Infusions: ?  ? sodium chloride    ? ceFEPime (MAXIPIME) IV Stopped (07/01/21 2140)  ? lactated ringers 100 mL/hr at 07/02/21 0751  ? metronidazole Stopped (07/02/21 0037)  ? ? ?PRN Inpatient Medications:  ?HYDROmorphone (DILAUDID) injection, morphine injection, ondansetron **OR** ondansetron (ZOFRAN) IV ? ?Review of Systems: ?Constitutional: Weight is stable.  ?Eyes: No changes in vision. ?ENT: No oral lesions, sore throat.  ?GI: see HPI.  ?Heme/Lymph: No easy bruising.  ?CV: No chest pain.  ?GU: No hematuria.  ?Integumentary: No rashes.  ?Neuro: No headaches.  ?Psych: No depression/anxiety.  ?Endocrine: No heat/cold intolerance.  ?Allergic/Immunologic: No urticaria.  ?Resp: No cough, SOB.  ?Musculoskeletal: No joint swelling.  ?  ?Physical Examination: ?BP (!) 149/92 (BP Location: Left Arm)   Pulse 88   Temp 98 ?F (36.7 ?C) (Oral)   Resp 20   Ht 5' 3.5" (1.613 m)   Wt 42.2 kg   LMP 01/03/1995 (Approximate)   SpO2 99%   BMI 16.22 kg/m?  ?Gen: NAD, alert and oriented x 4 ?HEENT: PEERLA, EOMI, ?Neck: supple, no JVD or thyromegaly ?Chest: CTA bilaterally, no wheezes, crackles, or other adventitious sounds ?CV: RRR, no m/g/c/r ?Abd: mildly distended, + bowel sounds present, tender to deep palpation diffusely,no HSM,  guarding, ridigity, or rebound tenderness, no fluid wave  ?Ext: no edema, well perfused with 2+ pulses, ?Skin: no rash or lesions noted ?Lymph: no LAD ? ?Data: ?Lab Results  ?Component Value Date  ? WBC 12.8 (H) 07/02/2021  ? HGB 8.5 (L) 07/02/2021  ? HCT 26.4 (L) 07/02/2021  ? MCV 83.5 07/02/2021  ? PLT 366 07/02/2021  ? ?Recent Labs  ?Lab 06/30/21 ?1109 07/01/21 ?5885 07/02/21 ?0277  ?HGB 8.7* 8.0* 8.5*  ? ?Lab Results  ?Component Value Date  ? NA 136 07/02/2021  ? K 3.8 07/02/2021  ? CL 104 07/02/2021  ? CO2 27 07/02/2021  ? BUN 12 07/02/2021  ? CREATININE 0.37 (L) 07/02/2021  ? ?Lab Results  ?Component Value Date  ? ALT 17 07/02/2021  ? AST 27 07/02/2021  ? ALKPHOS 806 (H) 07/02/2021  ? BILITOT 1.3 (H) 07/02/2021  ? ?Recent Labs  ?Lab 06/29/21 ?0448  ?INR 1.2  ? ?ERCP 06/30/21: ?Impression:             ?- Two stents from the common bile duct were seen in the major papilla. ?- One stent was removed from the common bile duct. ?- One covered metal stent was placed into the common bile duct. ?- There was a lot of inflammation in the antrum and duodenum without any fresh blood or ulcers seen. ?- Tumor invasion into the duodenum cant be r/o. ? ?Assessment/Plan: ? 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 ED 3/29 for low-grade fever, fatigue, nausea, malaise, and RUQ abdominal pain. Imaging concerning for worsening biliary obstruction with increased alkaline phosphatase and total bilirubin c/w cholestasis. GI consulted for further evaluation and management. She is s/p ERCP 3/31 with Dr. Allen Norris where plastic stent was removed and a longer metal stent was placed across the previously placed metal stent with good drainage.  ? ?  ?Sepsis 2/2 acute cholangitis with biliary obstruction - s/p ERCP  3/31 with new metal stent placed, cholestasis is stable.  ?  ?Anemia - stable with no overt GI bleeding, ERCP 3/31 with no signs of bleeding in esophagus, stomach, or proximal small intestine. Hemoglobin 8.5 this morning. She received 1 unit pRBCs 3/30.  ?  ?Pancreatic cancer - palliative care following  ?  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  ?Malnutrition ?  ?COPD - stable  ? ?Abdominal distention - stable, abdominal radiograph showing nonobstructive bowel gas pattern ? ? ?Recommendations: ? ?- No further GI interventions recommended at this time. Biliary drainage has been achieved.  ?- Continue to monitor daily LFTs ?- Continue to monitor serial H&H. Transfuse for Hgb <7.0.  ?- No signs of overt GI bleed. Continue to monitor for signs of bleeding.  ?- Appreciate palliative care involvement given overall poor prognosis  ?- Continue care of comorbidities per primary team ?- Continue antibiotics per primary team  ?- Continue regular diet as tolerated ?- GI will sign off at this time ? ?Please call with questions or concerns. ? ? ?Octavia Bruckner, PA-C ?[facility] ?[phone] ?[phone] (Cell) ? ? ? ?

## 2021-07-03 ENCOUNTER — Inpatient Hospital Stay: Payer: Medicare HMO

## 2021-07-03 ENCOUNTER — Encounter: Payer: Self-pay | Admitting: Gastroenterology

## 2021-07-03 DIAGNOSIS — R140 Abdominal distension (gaseous): Secondary | ICD-10-CM

## 2021-07-03 DIAGNOSIS — C801 Malignant (primary) neoplasm, unspecified: Secondary | ICD-10-CM | POA: Diagnosis not present

## 2021-07-03 DIAGNOSIS — K831 Obstruction of bile duct: Secondary | ICD-10-CM | POA: Diagnosis not present

## 2021-07-03 LAB — COMPREHENSIVE METABOLIC PANEL
ALT: 16 U/L (ref 0–44)
AST: 31 U/L (ref 15–41)
Albumin: 2 g/dL — ABNORMAL LOW (ref 3.5–5.0)
Alkaline Phosphatase: 777 U/L — ABNORMAL HIGH (ref 38–126)
Anion gap: 7 (ref 5–15)
BUN: 13 mg/dL (ref 8–23)
CO2: 27 mmol/L (ref 22–32)
Calcium: 8.4 mg/dL — ABNORMAL LOW (ref 8.9–10.3)
Chloride: 104 mmol/L (ref 98–111)
Creatinine, Ser: 0.39 mg/dL — ABNORMAL LOW (ref 0.44–1.00)
GFR, Estimated: >60 mL/min (ref >60)
Glucose, Bld: 122 mg/dL — ABNORMAL HIGH (ref 70–99)
Potassium: 3.7 mmol/L (ref 3.5–5.1)
Sodium: 138 mmol/L (ref 135–145)
Total Bilirubin: 1.1 mg/dL (ref 0.3–1.2)
Total Protein: 6.6 g/dL (ref 6.5–8.1)

## 2021-07-03 LAB — CBC
HCT: 27.5 % — ABNORMAL LOW (ref 36.0–46.0)
Hemoglobin: 8.7 g/dL — ABNORMAL LOW (ref 12.0–15.0)
MCH: 26.5 pg (ref 26.0–34.0)
MCHC: 31.6 g/dL (ref 30.0–36.0)
MCV: 83.8 fL (ref 80.0–100.0)
Platelets: 360 10*3/uL (ref 150–400)
RBC: 3.28 MIL/uL — ABNORMAL LOW (ref 3.87–5.11)
RDW: 18.1 % — ABNORMAL HIGH (ref 11.5–15.5)
WBC: 15.1 10*3/uL — ABNORMAL HIGH (ref 4.0–10.5)
nRBC: 0 % (ref 0.0–0.2)

## 2021-07-03 IMAGING — CT CT ABD-PELV W/ CM
2 of 5 series · 14 of 46 positions shown, 16 images · IV contrast (APPLIED)
Comparison: CT [DATE]

CLINICAL DATA: Bowel obstruction suspected

EXAM:
CT ABDOMEN AND PELVIS WITH CONTRAST
TECHNIQUE: Multidetector CT imaging of the abdomen and pelvis was performed
using the standard protocol following bolus administration of
intravenous contrast.

[Series 3: coronal st · coronal · 0.66mm/px · 3 of 102 slices shown]
[im 34/102  soft-tissue]
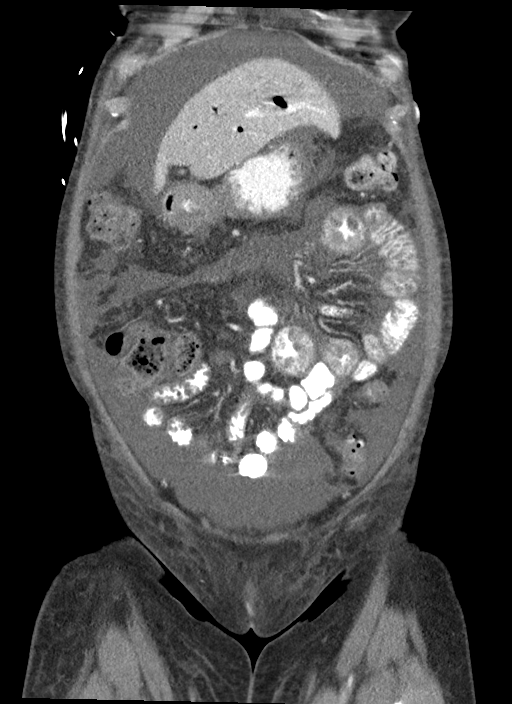
[im 45/102  soft-tissue]
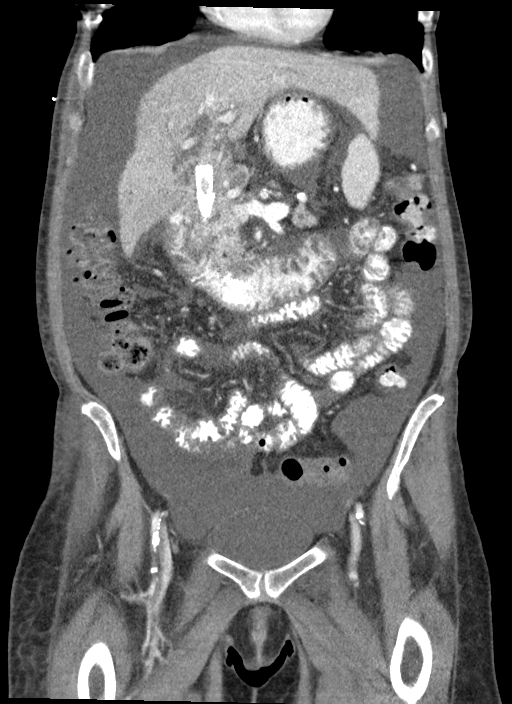
[im 57/102  soft-tissue]
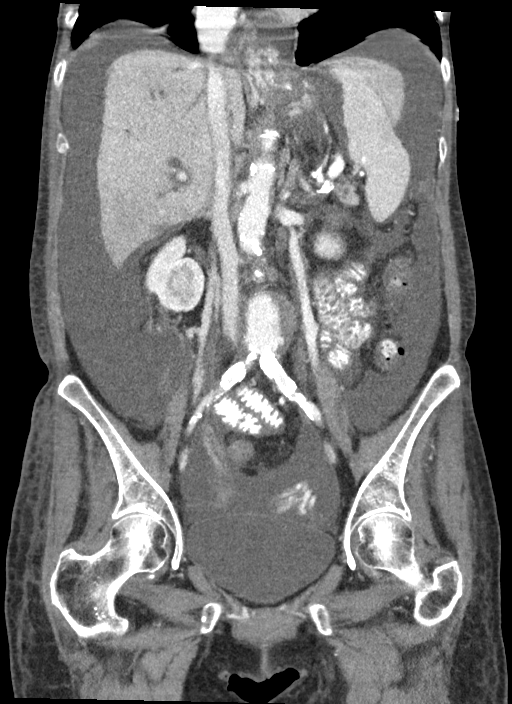

[Series 6: axial st · axial · 0.68mm/px · z∈[-561,-151]mm · 11 of 92 slices shown, 13 images]
[im 5/92  soft-tissue]
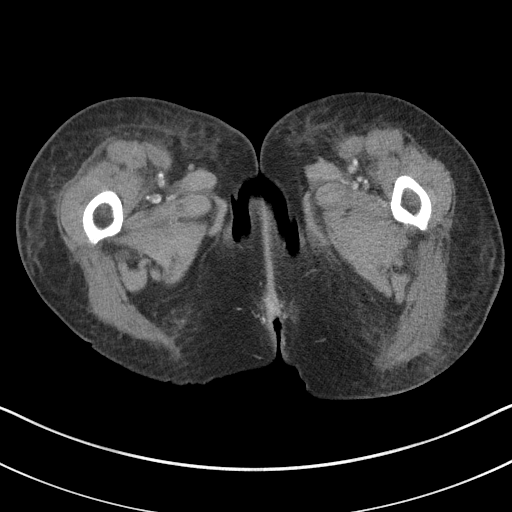
[im 5/92  bone]
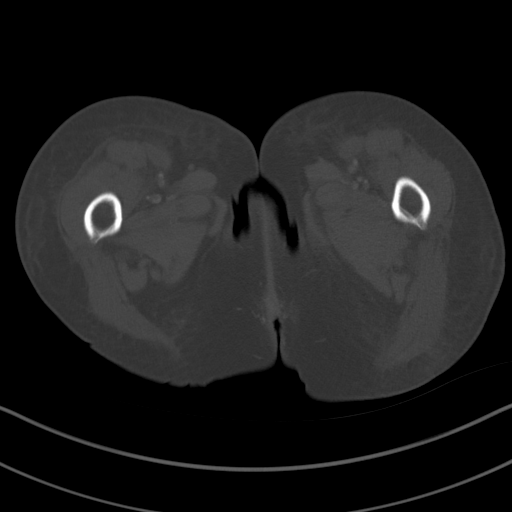
[im 14/92  soft-tissue]
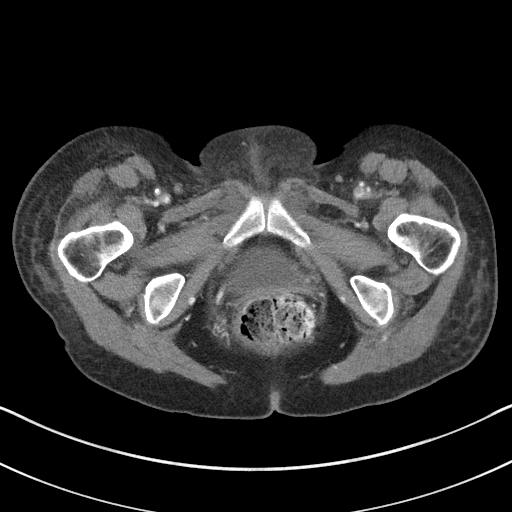
[im 23/92  soft-tissue]
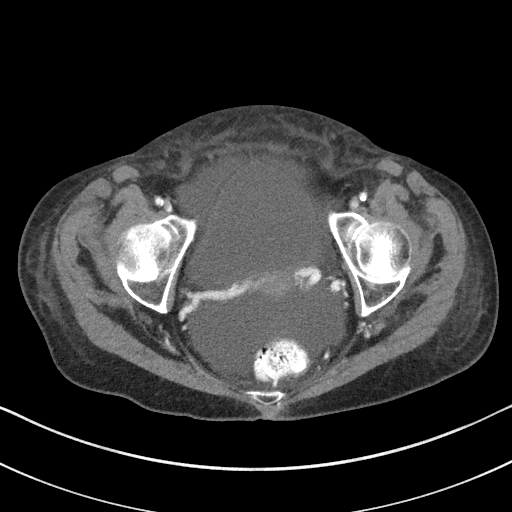
[im 32/92  soft-tissue]
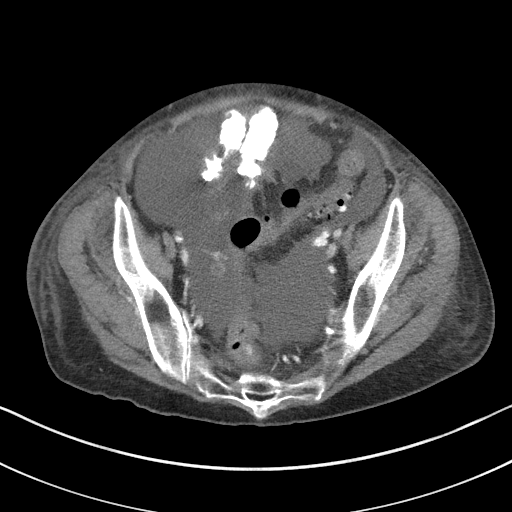
[im 37/92  soft-tissue]
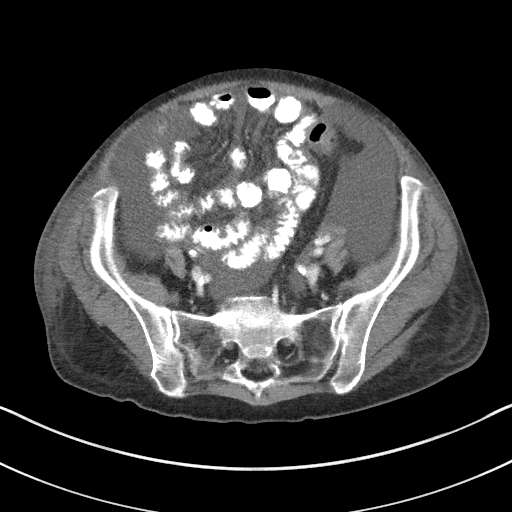
[im 46/92  soft-tissue]
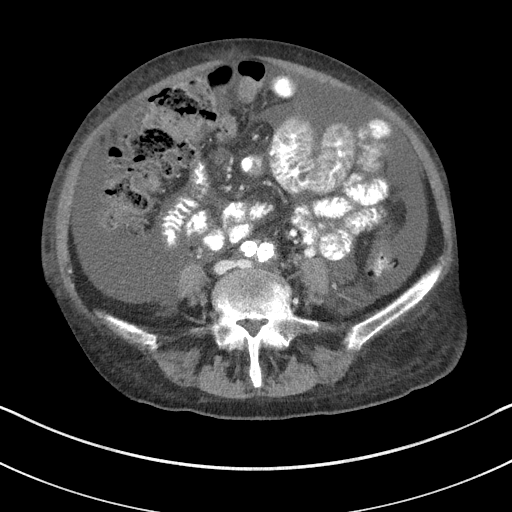
[im 55/92  soft-tissue]
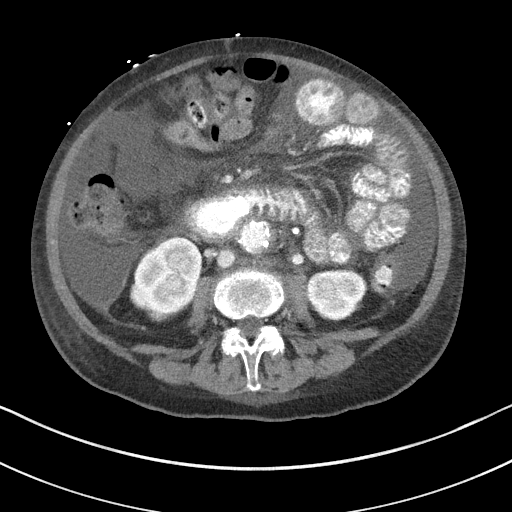
[im 60/92  soft-tissue]
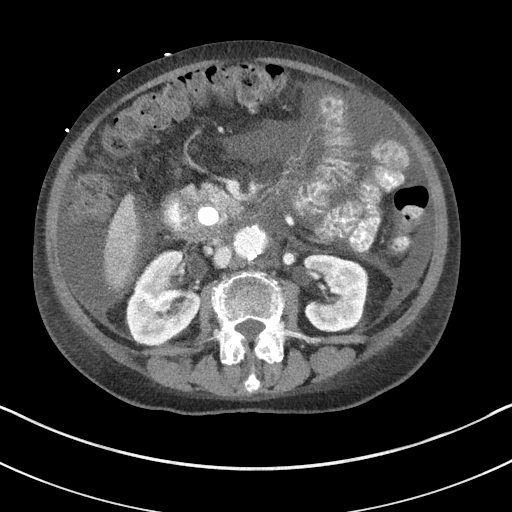
[im 69/92  soft-tissue]
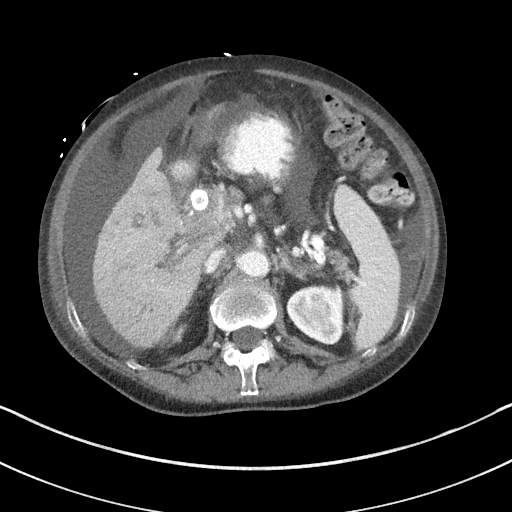
[im 69/92  bone]
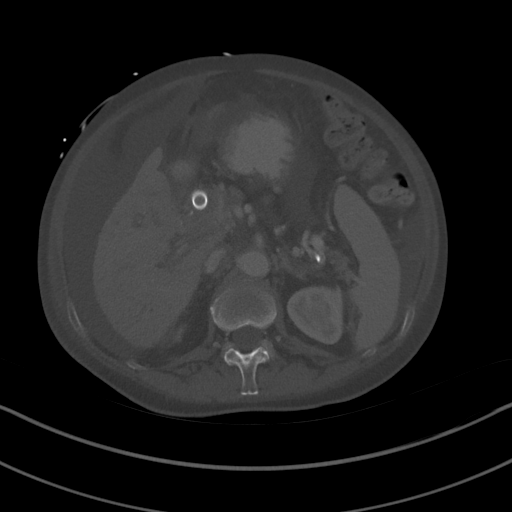
[im 78/92  soft-tissue]
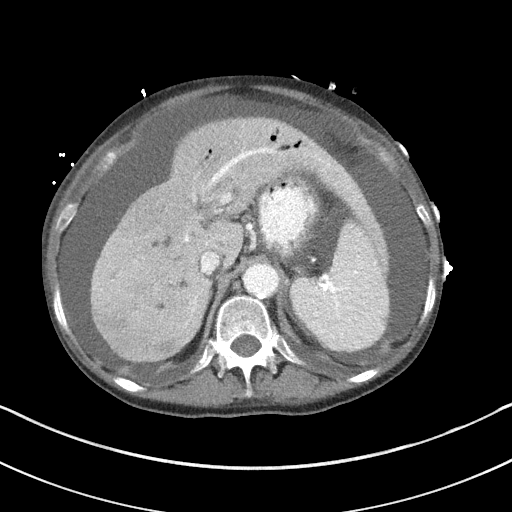
[im 87/92  soft-tissue]
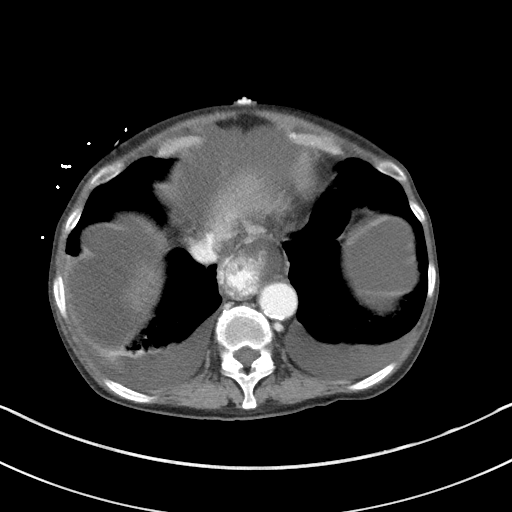

[14 of 46 positions shown; findings below may reference images not displayed]

RADIATION DOSE REDUCTION: This exam was performed according to the
departmental dose-optimization program which includes automated
exposure control, adjustment of the mA and/or kV according to
patient size and/or use of iterative reconstruction technique.

CONTRAST:  75mL OMNIPAQUE IOHEXOL 300 MG/ML  SOLN
FINDINGS: Lower chest: There are new small to moderate size bilateral pleural
effusions adjacent atelectasis.

Hepatobiliary: Status post removal of plastic stent removal and
placement of an additional metallic biliary stent. There are now 2
metallic biliary stents. There is persistent dilation of the
intrahepatic biliary ducts and post stent pneumobilia, not
significantly changed. Similar heterogeneity throughout the right
hepatic lobe.

Pancreas: Unchanged hypodense mass of the pancreatic head, with
atrophy and pancreatic ductal dilation distally. The pancreatic duct
measures up to 7 mm, previously 9 mm (series 6, image 27). Unchanged
effacement of the portal vein by the pancreatic mass and portacaval
soft tissue and/or lymphadenopathy.

Spleen: New small wedge-shaped peripheral defects in the spleen.

Adrenals/Urinary Tract: Adrenal glands are unremarkable. No
hydronephrosis or nephrolithiasis. The bladder is moderately
distended.

Stomach/Bowel: Moderate size hiatal hernia. There is no evidence of
bowel obstruction. The appendix is normal. Colonic
diverticulosis.Unchanged mild wall thickening of the cecum.

Vascular/Lymphatic: Aortoiliac atherosclerosis. Unchanged infrarenal
abdominal aortic aneurysm measuring up to 3.9 x 3.3 cm with
eccentric mural thrombus. There are bilateral common iliac artery
stents which appear patent. There are unchanged multiple enlarged
retroperitoneal lymph nodes. For reference, largest node measures
1.2 x 1.0 cm, previously 1.1 x 1.1 cm (series 6, image 36).

Reproductive: Unremarkable.

Other: Increased, large volume abdominopelvic ascites.

Musculoskeletal: No acute osseous abnormality. No suspicious lytic
or blastic lesions.
IMPRESSION: Interval placement of a new metallic stent within the existing
metallic stent. Persistent dilation of the intrahepatic biliary
ducts with post stenting pneumobilia, not significantly changed in
comparison to prior exam.

Increased, large volume abdominopelvic ascites. New, small to
moderate-sized bilateral pleural effusions.

Unchanged pancreatic head mass with atrophy and pancreatic ductal
dilatation distally. Pancreatic duct measures up to 7 mm, previously
9 mm.

Unchanged effacement of the portal vein by the pancreatic mass and
adjacent portacaval soft tissue and/or lymphadenopathy.

Unchanged retroperitoneal lymphadenopathy.

Persistent heterogeneous hypoenhancement throughout the liver, most
prominent the right hepatic lobe, which could be perfusion related
changes in the setting of portal vein occlusion. Cholangitis and
hepatic metastatic disease are additional possibilities.

Unchanged wall thickening and edema of the cecum, differential
considerations include portal colopathy or focal colitis.

Unchanged infrarenal abdominal aortic aneurysm with eccentric mural
thrombus. This measures 3.9 x 3.3 cm. Recommend follow-up ultrasound
every 2 years. This recommendation follows ACR consensus guidelines:
White Paper of the ACR Incidental Findings Committee II on Vascular
Findings. [HOSPITAL] [CC]; [DATE].

## 2021-07-03 IMAGING — US US ABDOMEN LIMITED
1 series · 8 of 8 positions shown · non-contrast
Comparison: [DATE].

CLINICAL DATA: Abdominal distension.

EXAM:
LIMITED ABDOMEN ULTRASOUND FOR ASCITES
TECHNIQUE: Limited ultrasound survey for ascites was performed in all four
abdominal quadrants.

[Series 1: us ascites (abdomen limited) · 8 of 8 slices shown]
[im 1/8]
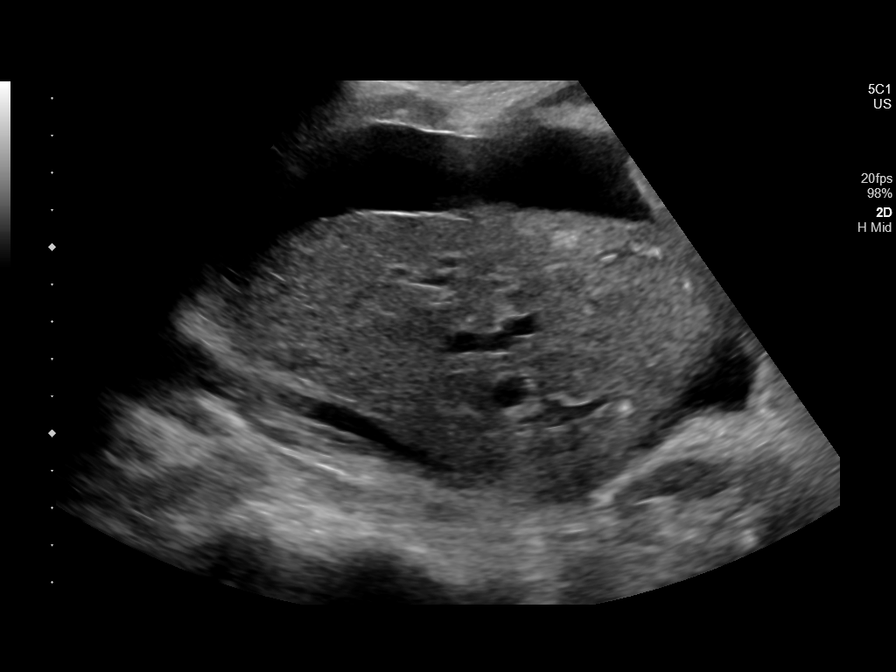
[im 2/8]
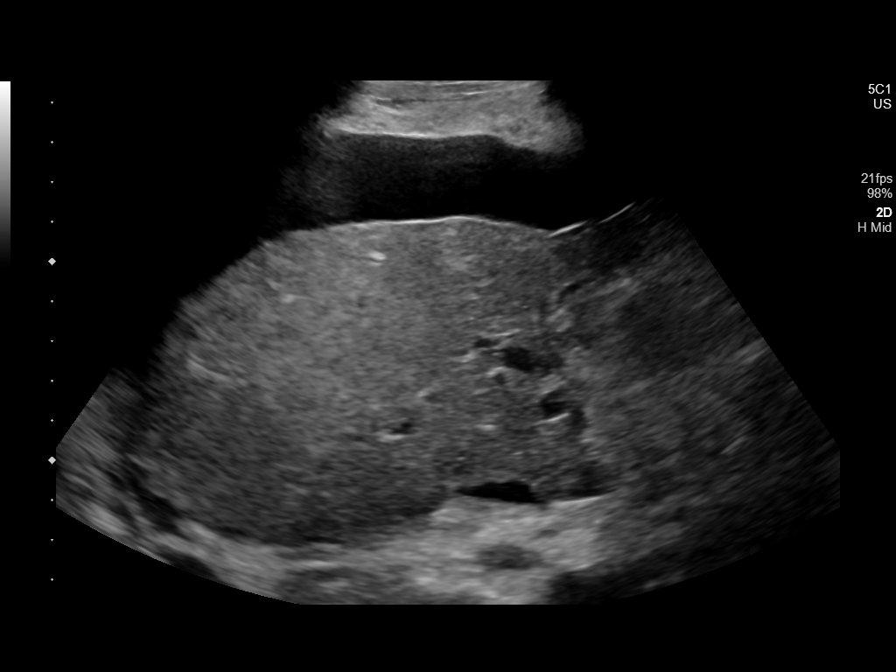
[im 3/8]
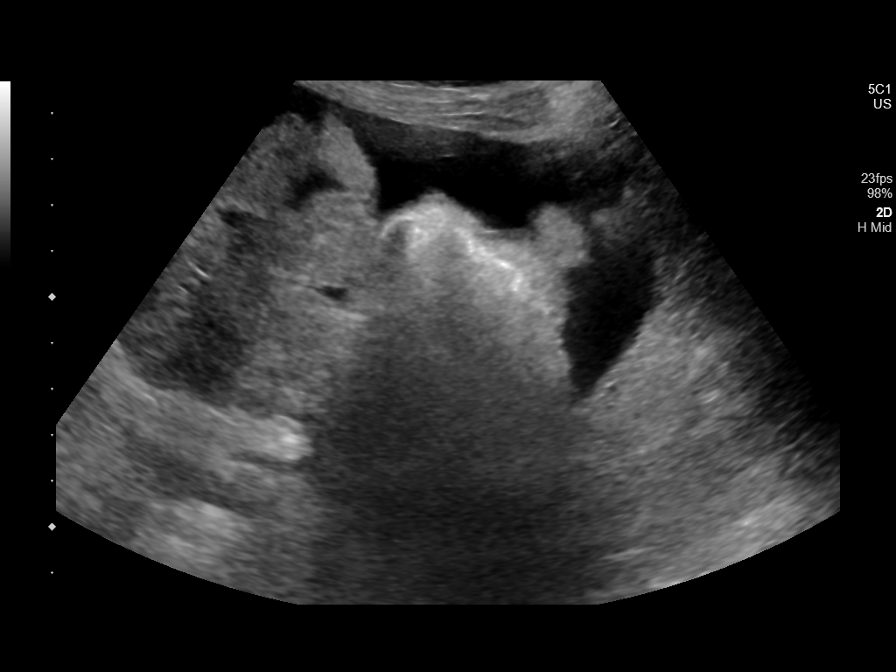
[im 4/8]
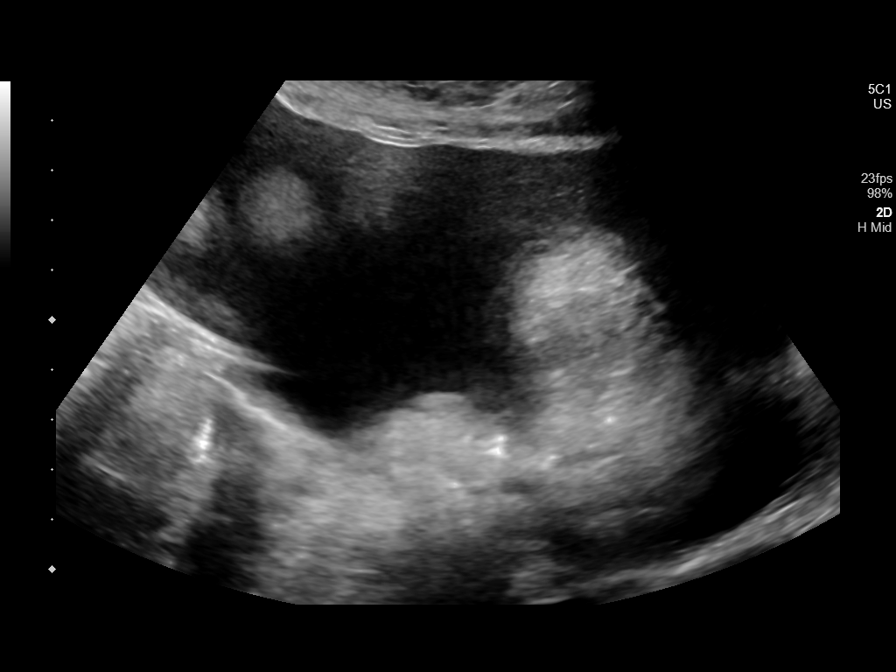
[im 5/8]
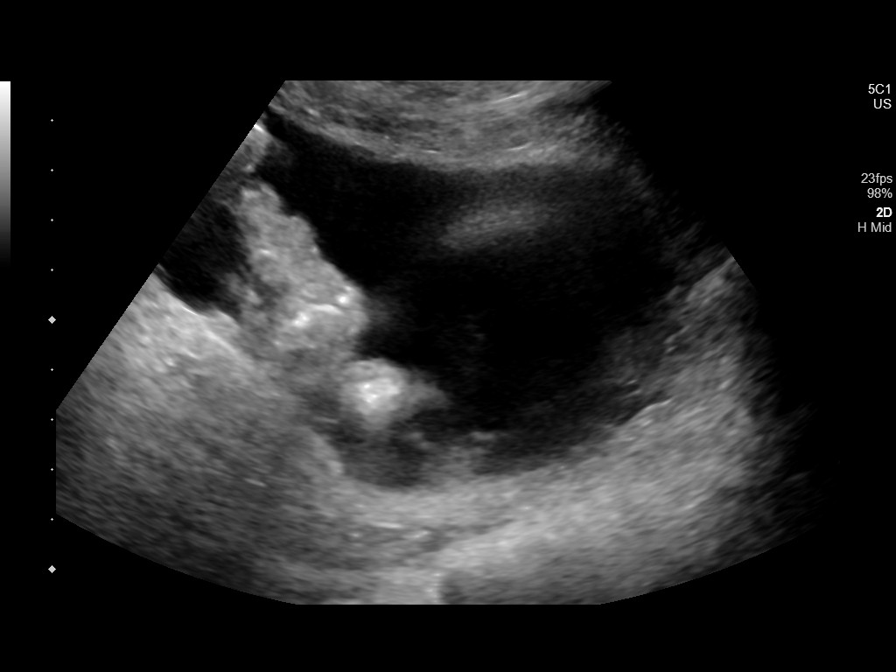
[im 6/8]
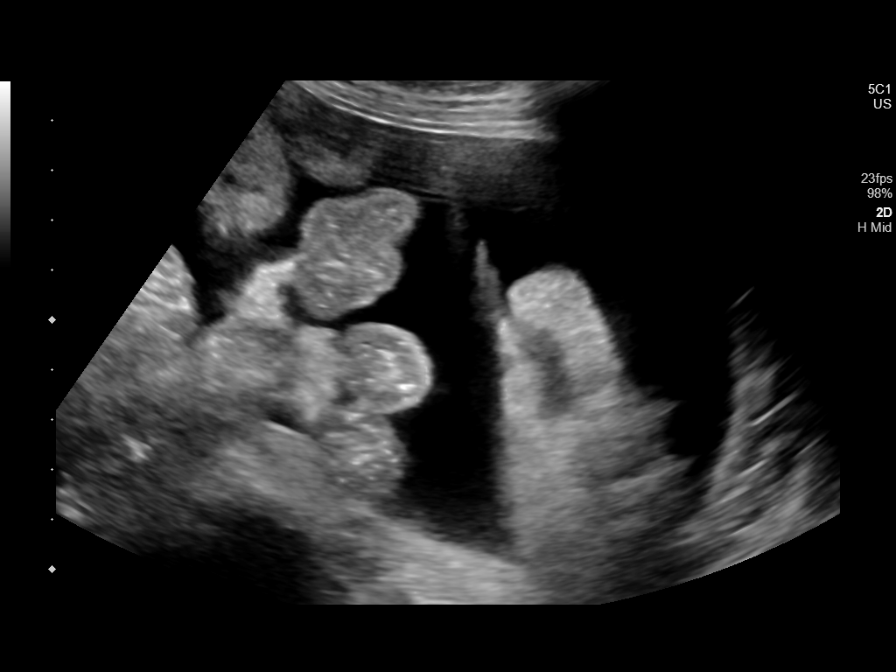
[im 7/8]
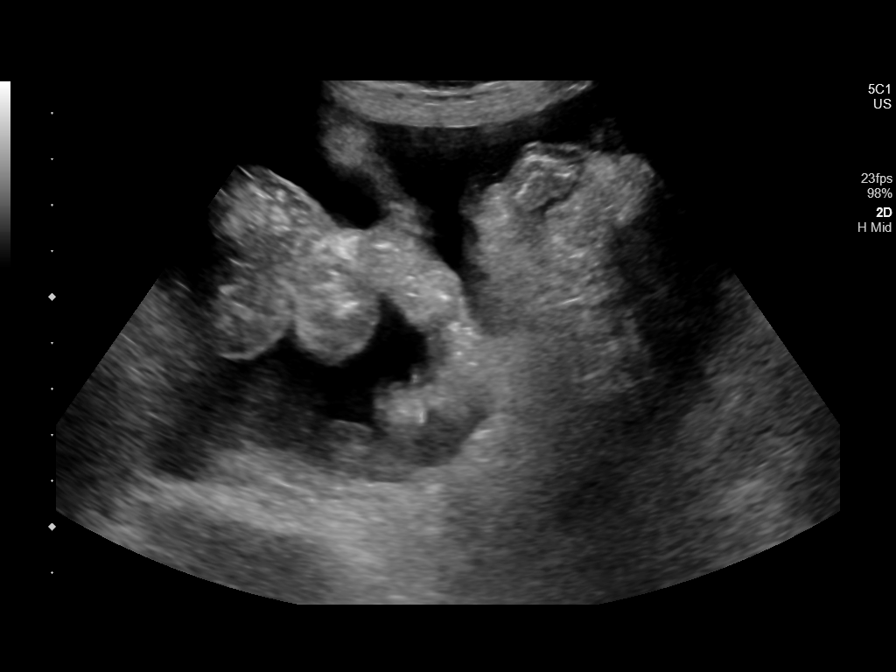
[im 8/8]
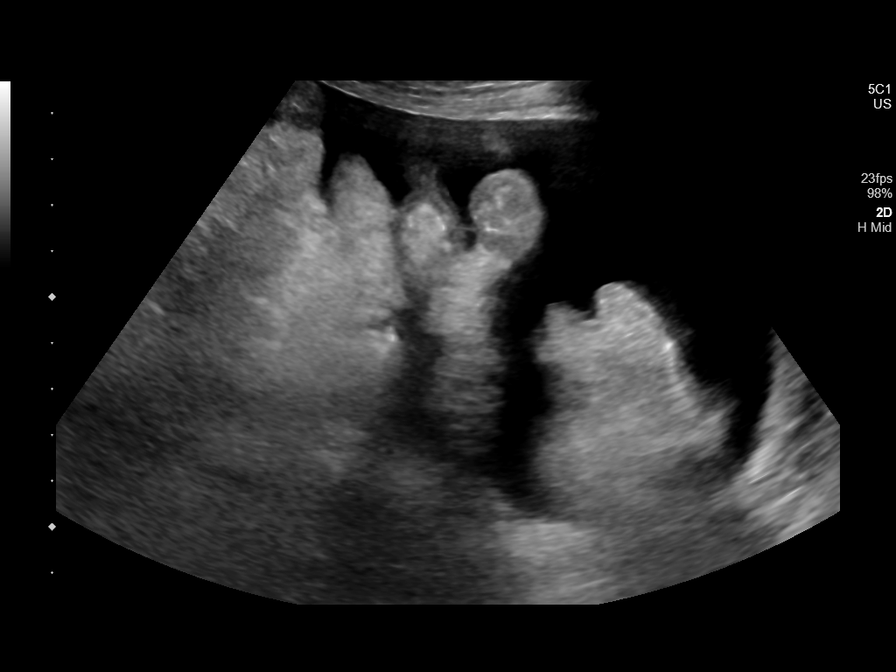

[8 of 8 positions shown; findings below may reference images not displayed]

FINDINGS: Mild ascites is noted in all 4 quadrants of the abdomen.
IMPRESSION: Mild ascites is noted.

## 2021-07-03 IMAGING — DX DG ABDOMEN 1V
2 series · 2 of 2 positions shown · non-contrast
Comparison: Abdominal radiograph dated [DATE]

CLINICAL DATA: Abdominal distension

EXAM:
ABDOMEN - 1 VIEW

[abdomen supine (1 of 2)]
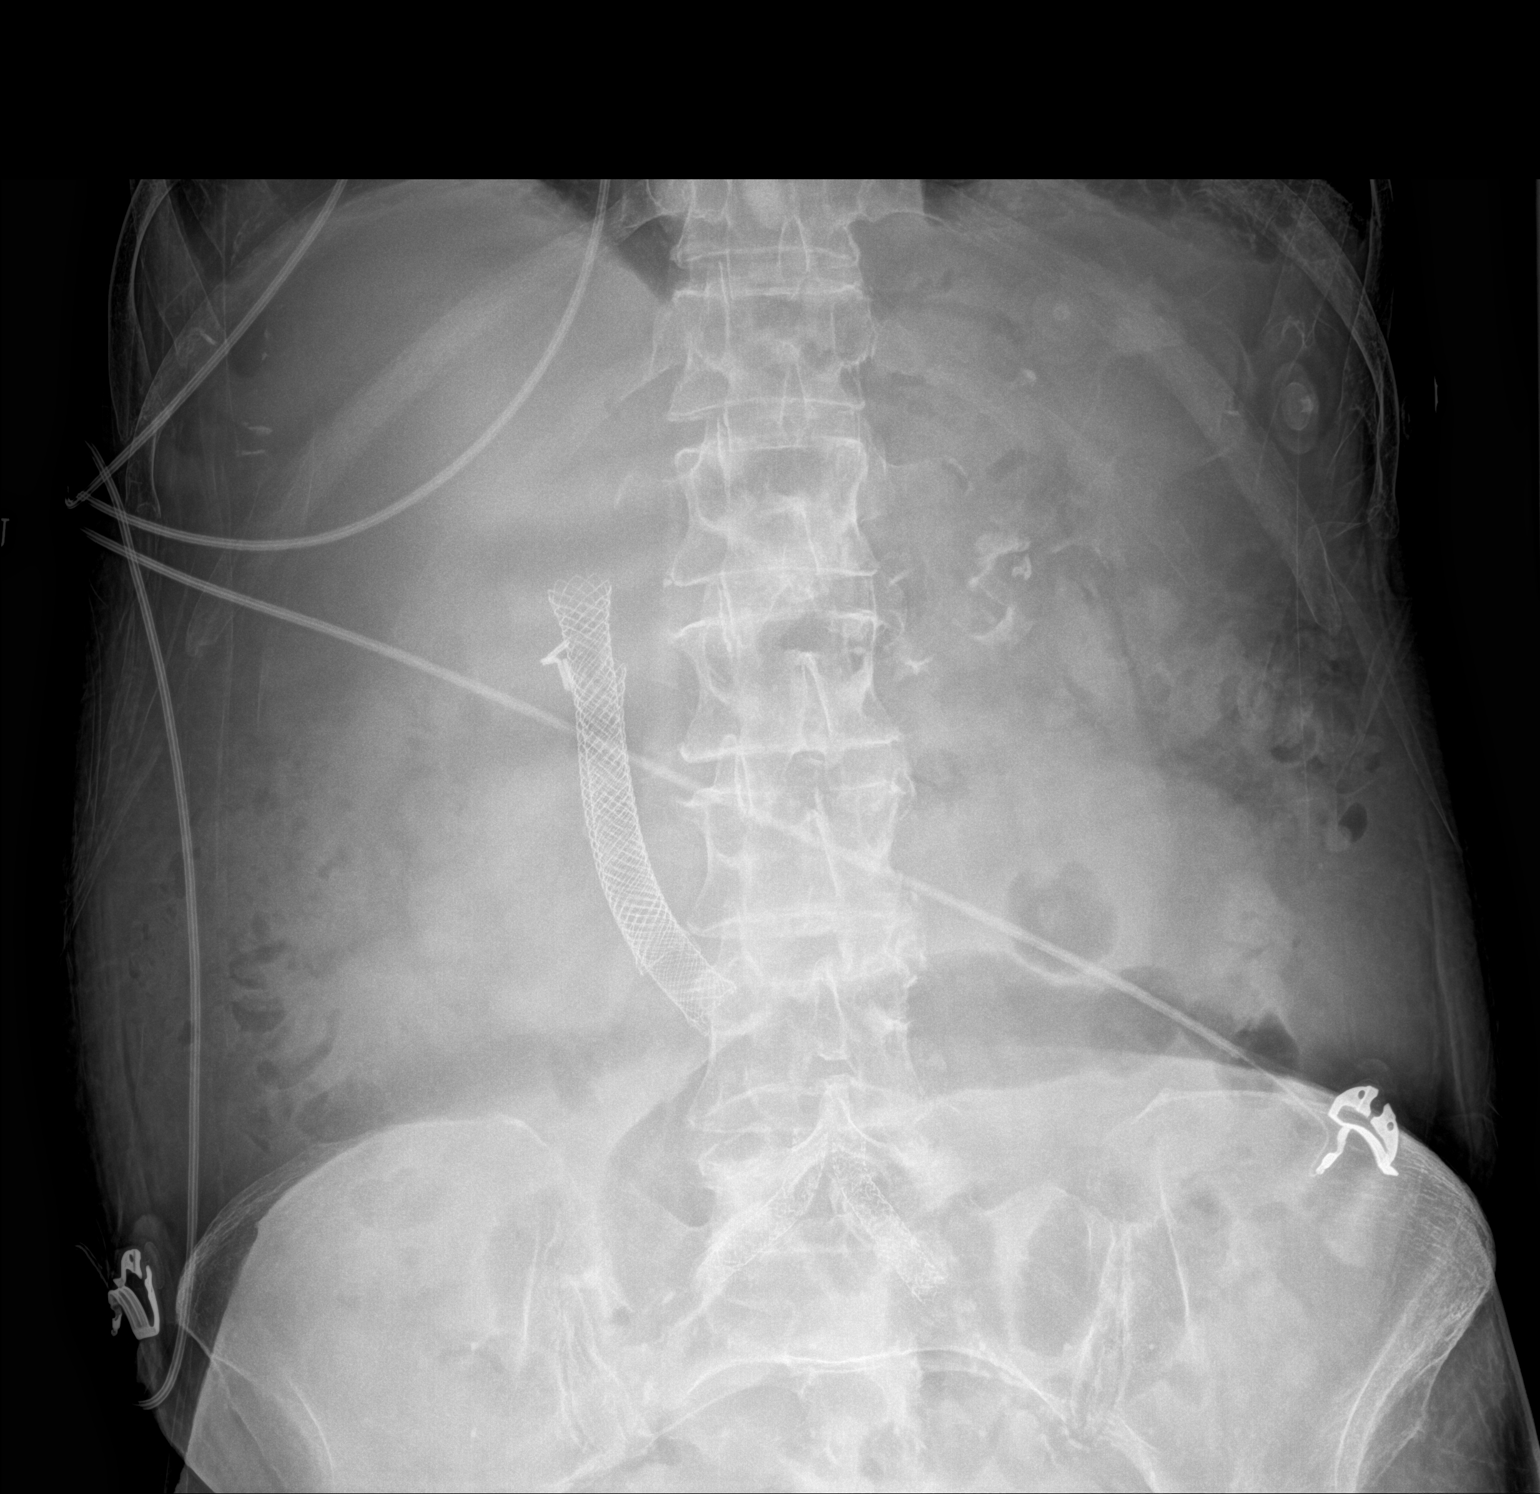

[abdomen supine (2 of 2)]
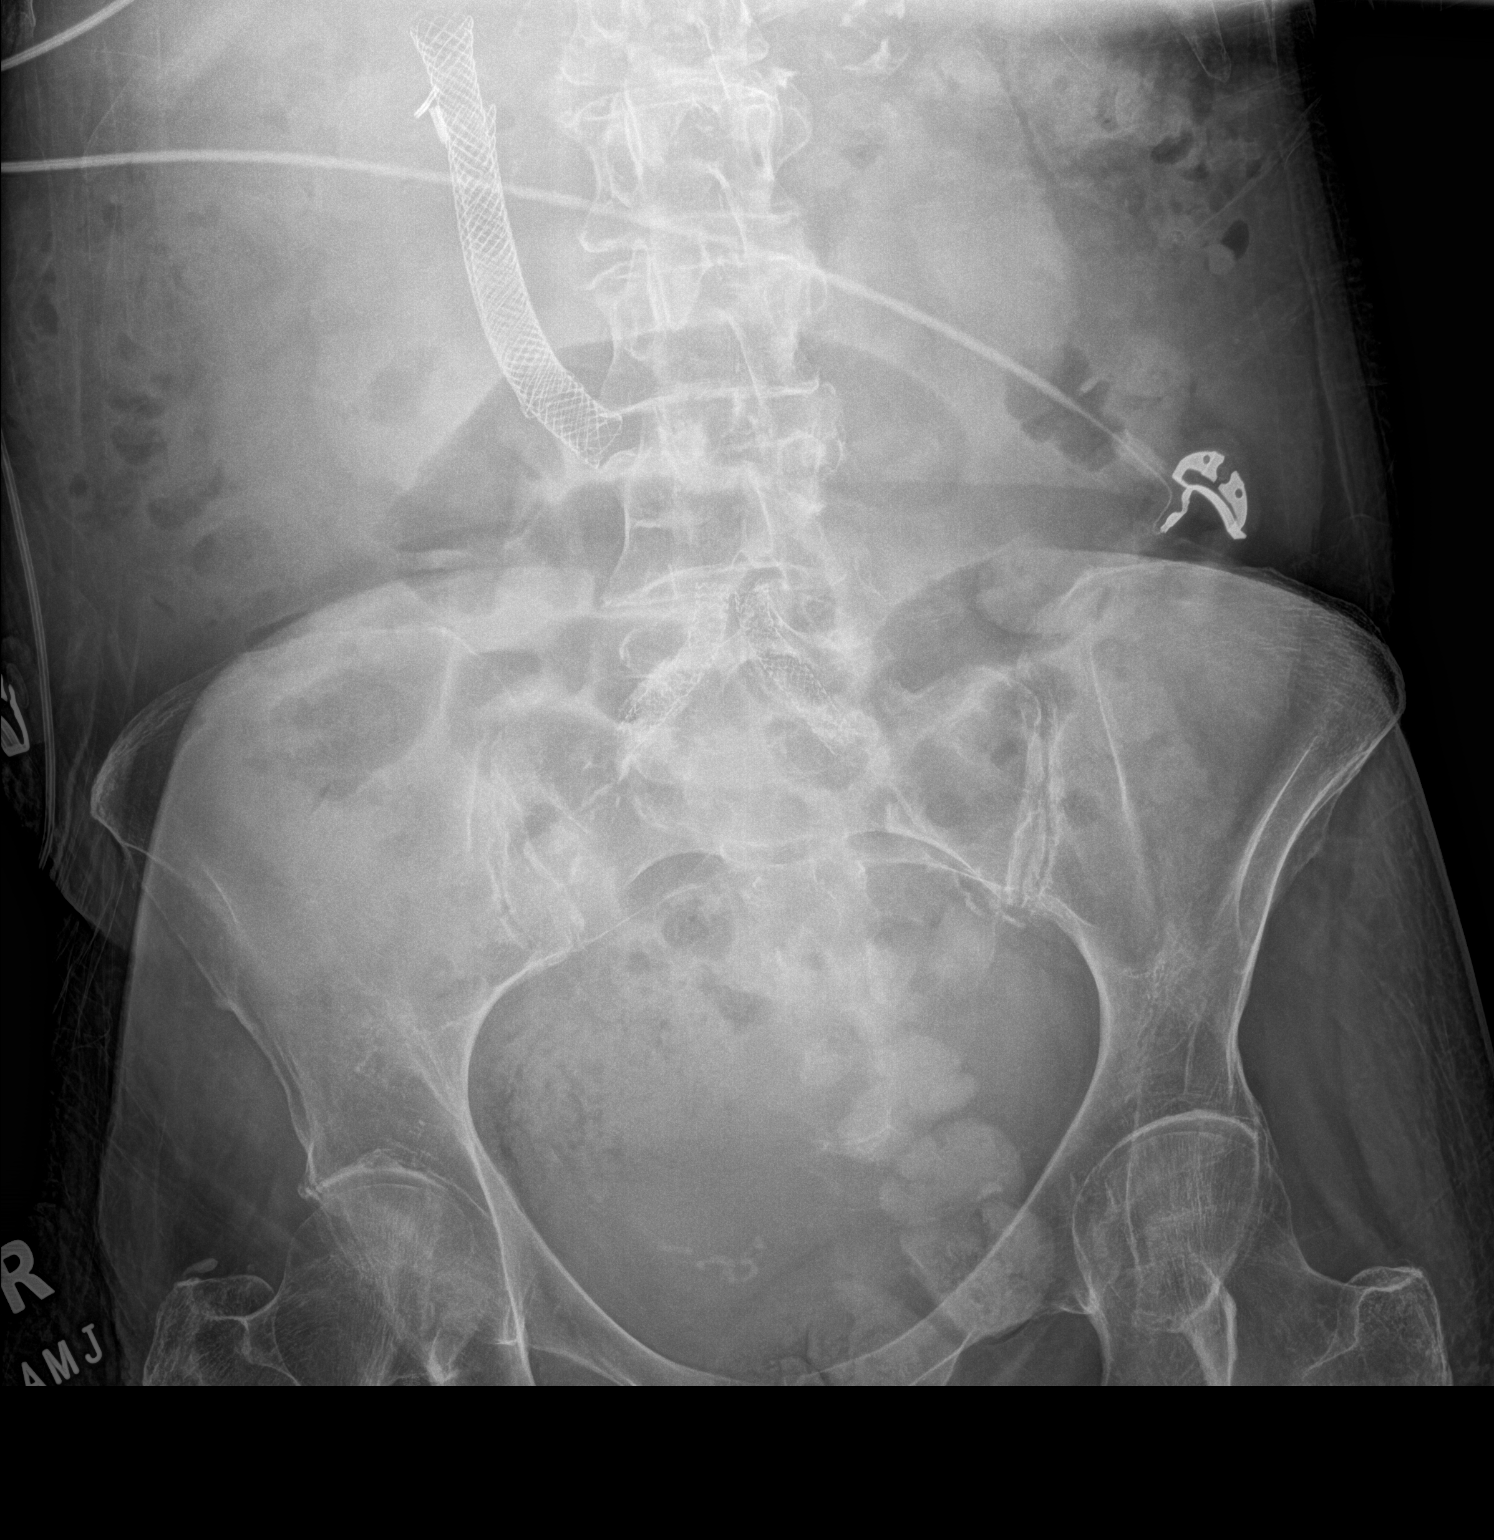

[2 of 2 positions shown; findings below may reference images not displayed]

FINDINGS: Mildly dilated loops of small bowel with gas seen in the large
bowel. Moderate stool burden. Common bile duct and left hepatic duct
stents again seen. Status post iliac artery stenting.
IMPRESSION: Mildly dilated loops of small bowel with gas seen in the large
bowel, differential considerations include ileus versus early or
partial small bowel obstruction.

## 2021-07-03 IMAGING — DX DG CHEST 1V PORT
1 series · 1 of 1 positions shown · non-contrast
Comparison: [DATE]

CLINICAL DATA: Persistent cough

EXAM:
PORTABLE CHEST 1 VIEW

[chest ap]
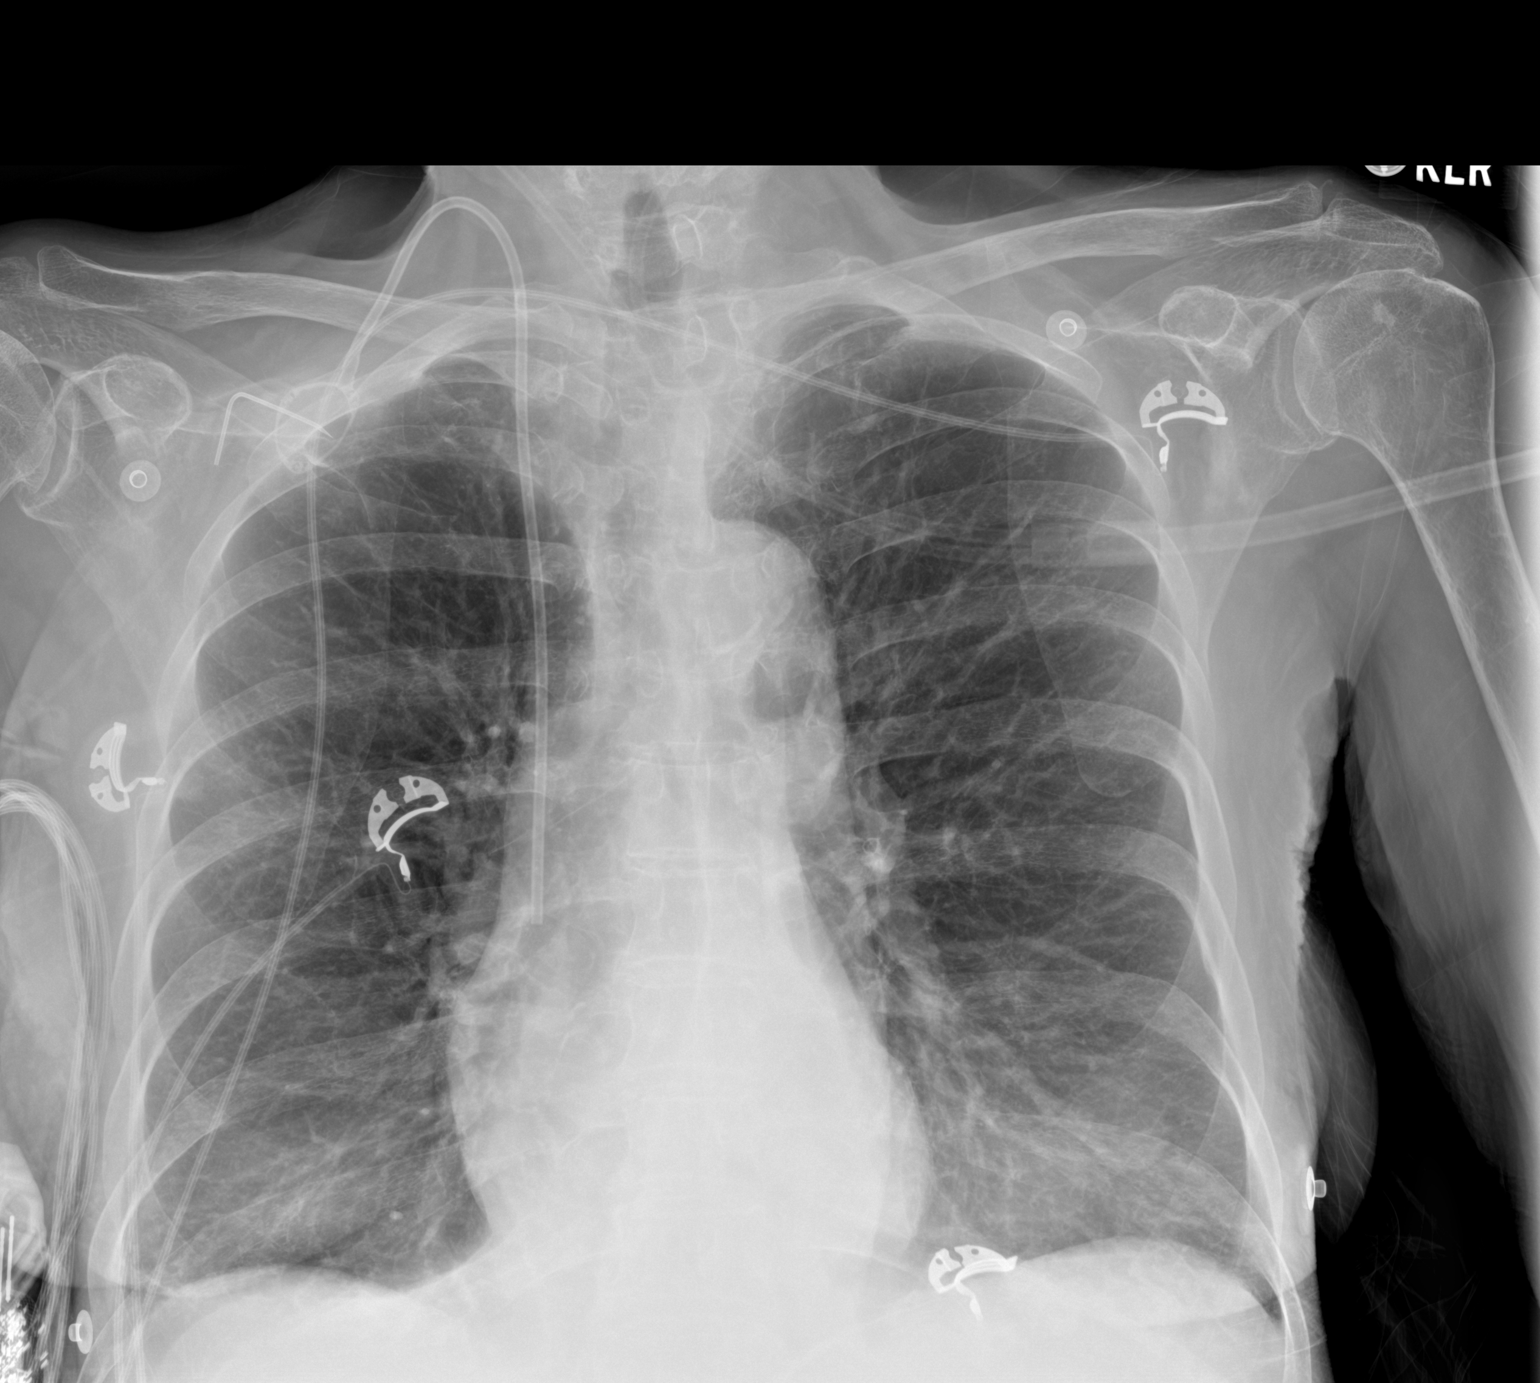

[1 of 1 positions shown; findings below may reference images not displayed]

FINDINGS: Power port type central venous catheter with tip over the low SVC
region. No pneumothorax. Heart size and pulmonary vascularity are
normal. Emphysematous changes and scattered fibrosis in the lungs.
No airspace disease or consolidation. No pleural effusions. No
pneumothorax. Mediastinal contours appear intact. Calcified and
tortuous aorta. Degenerative changes in the spine and shoulders.
IMPRESSION: Emphysematous changes and fibrosis in the lungs. No evidence of
active pulmonary disease.

## 2021-07-03 MED ORDER — IOHEXOL 9 MG/ML PO SOLN
500.0000 mL | ORAL | Status: AC
  Administered 2021-07-03 (×2): 500 mL via ORAL

## 2021-07-03 MED ORDER — IOHEXOL 300 MG/ML  SOLN
100.0000 mL | Freq: Once | INTRAMUSCULAR | Status: AC | PRN
  Administered 2021-07-03: 75 mL via INTRAVENOUS

## 2021-07-03 MED ORDER — DIPHENHYDRAMINE HCL 50 MG/ML IJ SOLN: 50.0000 mg | Freq: Once | INTRAMUSCULAR | Status: DC | PRN

## 2021-07-03 MED ORDER — METHYLPREDNISOLONE SODIUM SUCC 125 MG IJ SOLR: 125.0000 mg | Freq: Once | INTRAMUSCULAR | Status: DC | PRN

## 2021-07-03 MED ORDER — FAMOTIDINE 20 MG PO TABS: 40.0000 mg | ORAL_TABLET | Freq: Once | ORAL | Status: DC | PRN

## 2021-07-03 NOTE — Progress Notes (Signed)
Physical Therapy Treatment ?Patient Details ?Name: Shirley Barton ?MRN: [id number] ?DOB: Sep 25, 1950 ?Today's Date: 07/03/2021 ? ? ?History of Present Illness 71 y.o. female who presents to the ED from her oncologist office where she went for follow-up  due to development of a fever of 100.4 and continued malaise.  Right upper quadrant pain about the same.  Pt admitted with cholangitis with biliary obstruction.  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 ?  ?PT Comments  ? ? Pt received in bed. Upon initial assessment, pt with labored breathing noted, O2 sats at 95% at rest, HR 104bpm, pt c/o abdominal discomfort. Pt assisted up to the bathroom using RW and CGA for safe mobility and returned after a few minutes with significant DOE O2 sats 73%. Nursing called in and contacted MD regarding concers. Pt did return to 90% after several minutes however continued to display labored breathing even at rest.  ?  ?Recommendations for follow up therapy are one component of a multi-disciplinary discharge planning process, led by the attending physician.  Recommendations may be updated based on patient status, additional functional criteria and insurance authorization. ? ?Follow Up Recommendations ? Home health PT ?  ?  ?Assistance Recommended at Discharge Intermittent Supervision/Assistance  ?Patient can return home with the following Assistance with cooking/housework;Assist for transportation;Help with stairs or ramp for entrance ?  ?Equipment Recommendations ?    ?  ?Recommendations for Other Services   ? ? ?  ?Precautions / Restrictions  Precautions ?Precautions: None ?Precaution Comments: Good balance with ambulation around room with no LOB or fatigue. ?Restrictions ?Weight Bearing Restrictions: No  ?  ? ?Mobility ? Bed Mobility ?Overal bed mobility: Modified Independent ?  ?  ?  ?  ?  ?  ?General bed mobility comments: used handrails for bed mobility but no A from PT. ?  ? ?Transfers ?Overall transfer level: Independent ?Equipment used: Rolling walker (2 wheels) ?  ?  ?  ?  ?  ?  ?  ?General transfer comment: Able to perform STS transfer and ambulation wihtout AD and with LOB ?  ? ?Ambulation/Gait ?Ambulation/Gait assistance: Independent ?Gait Distance (Feet):  (40 x 2) ?Assistive device: Rolling walker (2 wheels) ?Gait Pattern/deviations: Decreased step length - left, Decreased step length - right ?  ?  ?  ?General Gait Details:  (RW utilized due to increased abd discomfort) ? ? ?Stairs ?  ?  ?  ?  ?  ? ? ?Wheelchair Mobility ?  ? ?Modified Rankin (Stroke Patients Only) ?  ? ? ?  ?Balance   ?  ?  ?  ?  ?  ?  ?  ?  ?  ?  ?  ?  ?  ?  ?  ?  ?  ?  ?  ? ?  ?Cognition Arousal/Alertness: Awake/alert ?Behavior During Therapy: [facility] for tasks assessed/performed ?Overall Cognitive Status: Within Functional Limits for tasks assessed ?  ?  ?  ?  ?  ?  ?  ?  ?  ?  ?  ?  ?  ?  ?  ?  ?General Comments:  (Limited activity due to DOE with O2 sats dropping) ?  ?  ? ?  ?  Exercises   ? ?  ?General Comments General comments (skin integrity, edema, etc.): Pt with labored breathing on RA, O2 sats 95% at rest ?  ?  ? ?Pertinent Vitals/Pain Pain Assessment ?Pain Assessment: 0-10 ?Pain Score: 3  ?Pain Location: abdomen ?Pain Descriptors / Indicators: Aching, Discomfort, Grimacing, Pressure, Tightness  ? ? ?Home Living   ?  ?  ?  ?  ?  ?  ?  ?  ?  ?   ?  ?Prior Function    ?  ?  ?   ? ?PT Goals (current goals can now be found in the care plan section) Acute Rehab PT Goals ?Patient Stated Goal: Go home ? ?  ?Frequency ? ? ? Min 2X/week ? ? ? ?  ?PT Plan    ? ? ?Co-evaluation    ?  ?  ?  ?  ? ?  ?AM-PAC PT "6 Clicks" Mobility   ?Outcome Measure ? Help needed turning from your back to your side while in a flat bed without using bedrails?: A Little ?Help needed moving from lying on your back to sitting on the side of a flat bed without using bedrails?: None ?Help needed moving to and from a bed to a chair (including a wheelchair)?: A Little ?Help needed standing up from a chair using your arms (e.g., wheelchair or bedside chair)?: A Little ?Help needed to walk in hospital room?: A Little ?Help needed climbing 3-5 steps with a railing? : A Lot ?6 Click Score: 18 ? ?  ?End of Session Equipment Utilized During Treatment: Gait belt ?Activity Tolerance: Other (comment) (limited due to drop in O2 sats) ?Patient left: in bed;with call bell/phone within reach;with bed alarm set ?Nurse Communication: Mobility status (O2 sats) ?PT Visit Diagnosis: Unsteadiness on feet (R26.81);Muscle weakness (generalized) (M62.81) ?  ? ? ?Time: 8891-6945 ?PT Time Calculation (min) (ACUTE ONLY): 30 min ? ?Charges:  $Gait Training: 8-22 mins ?$Therapeutic Activity: 8-22 mins          ?          ?Mikel Cella, PTA ? ? ? ?Shirley Barton ?07/03/2021, 2:53 PM ? ?

## 2021-07-03 NOTE — Hospital Course (Addendum)
Taken from prior notes. ? ?Shirley Barton is a 71 y.o. 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  Right upper quadrant pain about the same.  He has no nausea, vomiting or diarrhea, cough or shortness of breath. ?ED course: On arrival afebrile, tachycardic to 114, tachypneic to 23 with initially normal BP with subsequently went as low as 103/57 just prior to admission.  Blood work reveals WBC of 14,000 with lactic acid 1.5 hemoglobin 6.2 down from 7.4 on 3/27.  Lipase 67, with alk phos 827, up from 598 on 3/27 and total bilirubin 1.4 up from 1.0 on 3/27.  BUN/creatinine WNL urinalysis with 5 ketones small leukocytes negative nitrites and rare bacteria. ? ?CT abdomen was obtained on admission-please see the full report. ? ?The emergency room provider spoke with a biliary specialist at [facility], Dr. Jola Schmidt, at the recommendation of local GI, Dr. Alice Reichert to request transfer however there was no bed availability.  Dr. Alice Reichert subsequently agreed to consult with Dr. Allen Norris for placement of biliary stent here . ?S/p biliary stent placement with ERCP.  Patient tolerated the procedure well. ? ?Continue to have worsening abdominal distention and pain. ?KUB with dilated small bowel loops. ?Abdominal ultrasound with mild ascites. ? ?General surgery was consulted for concern of partial small bowel obstruction. ? ?Palliative care was also consulted but patient would like to remain  full code with full scope of medical management.  Palliative care with follow-up at cancer center as an outpatient. ? ?4/4: CT abdomen with significant ascites and no bowel obstruction.  Ultrasound-guided paracentesis was ordered, removal of 2.5 L of fluid, appears transudate with normal LDH and low protein, total nucleated cells of 375 with 60% neutrophil, patient received antibiotics for concern of cholangitis and completed the course today. ?Continue to have abdominal pain, much less distention today. ?Last bowel movement was on Saturday-MiraLAX ordered. ?If remains stable can be discharged home with home health services tomorrow. ? ?4/5: Patient remained stable and no new complaints when seen today.  She is being discharged home with home health services and need to have a close follow-up with her oncologist for further recommendations.  She was provided with pain medications and should avoid constipation. ? ?She will continue current medications and follow-up with her providers. ? ?

## 2021-07-03 NOTE — Progress Notes (Signed)
?Progress Note ? ? ?Patient: Shirley Barton LKG:[id number] DOB: [birth year] DOA: 06/28/2021     5 ?DOS: the patient was seen and examined on 07/03/2021 ?  ?Brief hospital course: ?Taken from prior notes. ? ?Shirley Barton is a 71 y.o. 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  Right upper quadrant pain about the same.  He has no nausea, vomiting or diarrhea, cough or shortness of breath. ?ED course: On arrival afebrile, tachycardic to 114, tachypneic to 23 with initially normal BP with subsequently went as low as 103/57 just prior to admission.  Blood work reveals WBC of 14,000 with lactic acid 1.5 hemoglobin 6.2 down from 7.4 on 3/27.  Lipase 67, with alk phos 827, up from 598 on 3/27 and total bilirubin 1.4 up from 1.0 on 3/27.  BUN/creatinine WNL urinalysis with 5 ketones small leukocytes negative nitrites and rare bacteria. ? ?CT abdomen was obtained on admission-please see the full report. ? ?The emergency room provider spoke with a biliary specialist at [facility], Dr. Jola Schmidt, at the recommendation of local GI, Dr. Alice Reichert to request transfer however there was no bed availability.  Dr. Alice Reichert subsequently agreed to consult with Dr. Allen Norris for placement of biliary stent here . ?S/p biliary stent placement with ERCP.  Patient tolerated the procedure well. ? ?Continue to have worsening abdominal distention and pain. ?KUB with dilated small bowel loops. ?Abdominal ultrasound  with mild ascites. ? ?General surgery was consulted for concern of partial small bowel obstruction. ? ?Palliative care was also consulted but patient would like to remain full code with full scope of medical management.  Palliative care with follow-up at cancer center as an outpatient. ? ? ? ? ? ?Assessment and Plan: ?* Biliary obstruction due to cancer [facility]) ?Patient with stage IV pancreatic cancer.  Admitted for recurrent biliary obstruction. ?Patient has had multiple biliary stents placed.  Last biliary stent removed and exchanged for a metallic biliary stent. ?Continue to have abdominal pain. ?-Continue with pain management ?-Ordered abdominal ultrasound and x-ray for worsening abdominal distention. ? ?Abdominal distention ?Patient developed worsening abdominal distention and pain. ?Abdominal ultrasound with mild ascites. ?Abdominal x-ray with concern of ileus/partial obstruction with dilated bowel. ?General surgery was consulted and they ordered abdominal ultrasound with contrast. ?Also concern of restriction secondary to disease progression. ?No nausea or vomiting-NG tube was not ordered but if she develops nausea or vomiting, she will need NG tube. ?-Continue to monitor ?-Follow CT abdomen and surgery recommendations ? ?Malignant neoplasm of head of pancreas ([place]) ?- Being followed up with oncology as an outpatient. ? ?Sepsis ([place]) ?Suspected etiology includes acute cholangitis given WBC 20,000 on 3/27, fever of 100.4 in oncology office on 3/29, worsening biliary obstruction, malaise ?Cefepime Flagyl and Vanco for intra-abdominal/sepsis of unknown source, concerning for cholangitis ?Cultures remain negative. ?-Continue with cefepime and Flagyl to complete the course. ? ?Chronic obstructive pulmonary disease ([place]) ?Continue Advair with DuoNebs as needed ? ?Malnutrition of moderate degree ?Estimated body mass index is 16.22 kg/m? as calculated  from the following: ?  Height as of this encounter: 5' 3.5" (1.613  m). ?  Weight as of this encounter: 42.2 kg.  ? ?-Dietitian consult ? 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 ?Depression ?Hold Xanax ? ?Essential (primary) hypertension ?Hold home antihypertensives due to soft blood pressures ? ? ?  ? ?Subjective: Patient was complaining of worsening abdominal pain and distention.  No nausea or vomiting. ? ?Physical Exam: ?Vitals:  ? 07/03/21 0733 07/03/21 1124 07/03/21 1548 07/03/21 1548  ?BP: (!) 166/88 126/81 120/81 120/81  ?Pulse: 98 95 (!) 102 (!) 101  ?Resp: _0 ?Temp: 97.9 ?F (36.6 ?C) 98.4 ?F (36.9 ?C) 97.9 ?F (36.6 ?C) 97.9 ?F (36.6 ?C)  ?TempSrc: Oral Oral Oral Oral  ?SpO2: 97% 96% 93% 94%  ?Weight:      ?Height:      ? ?General.  Frail and cachectic lady, in no acute distress. ?Pulmonary.  Lungs clear bilaterally, normal respiratory effort. ?CV.  Regular rate and rhythm, no JVD, rub or murmur. ?Abdomen.  Tense and distended, diffuse tenderness, BS positive. ?CNS.  Alert and oriented .  No focal neurologic deficit. ?Extremities.  No edema, no cyanosis, pulses intact and symmetrical. ?Psychiatry.  Judgment and insight appears normal. ? ?Data Reviewed: ?Prior notes, labs and images reviewed ? ?Family Communication: Discussed with son on phone ? ?Disposition: ?Status is: Inpatient ?Remains inpatient appropriate because: Severity of illness ? ? Planned Discharge Destination: Home with Home Health ? ?Time spent: 50 minutes ? ?This record has been created using Systems analyst. Errors have been sought and corrected,but may not always be located. Such creation errors do not reflect on the standard of care. ? ?Author: ?Lorella Nimrod, MD ?07/03/2021 5:38 PM ? ?For on call review [email or web address].  ?

## 2021-07-03 NOTE — Assessment & Plan Note (Signed)
Patient developed worsening abdominal distention and pain. ?Abdominal ultrasound with mild ascites. ?Abdominal x-ray with concern of ileus/partial obstruction with dilated bowel. ?General surgery was consulted and they ordered abdominal ultrasound with contrast. ?Also concern of restriction secondary to disease progression. ?No nausea or vomiting-NG tube was not ordered but if she develops nausea or vomiting, she will need NG tube. ?-Continue to monitor ?-Follow CT abdomen and surgery recommendations ?

## 2021-07-03 NOTE — Assessment & Plan Note (Signed)
Estimated body mass index is 16.22 kg/m? as calculated from the following: ?  Height as of this encounter: 5' 3.5" (1.613 m). ?  Weight as of this encounter: 42.2 kg.  ? ?-Dietitian consult ?

## 2021-07-03 NOTE — Progress Notes (Signed)
Patient ID: Shirley Barton, female   DOB: 02-18-1951, 71 y.o.   MRN: [id number] ?    SURGICAL PROGRESS NOTE  ? ?Hospital Day(s): 5.  ? ?Interval History: Patient seen and reevaluated.  Patient continue with abdominal distention and discomfort.  No nausea or vomiting. ? ?Vital signs in last 24 hours: [min-max] current  ?Temp:  [97.9 ?F (36.6 ?C)-98.7 ?F (37.1 ?C)] 97.9 ?F (36.6 ?C) (04/03 1548) ?Pulse Rate:  [95-103] 101 (04/03 1548) ?Resp:  [16-18] 17 (04/03 1548) ?BP: (120-166)/(81-88) 120/81 (04/03 1548) ?SpO2:  [93 %-97 %] 94 % (04/03 1548)     Height: 5' 3.5" (161.3 cm) Weight: 42.2 kg BMI (Calculated): 16.21  ? ?Physical Exam:  ?Constitutional: alert, cooperative and no distress  ?Respiratory: breathing non-labored at rest  ?Cardiovascular: regular rate and sinus rhythm  ?Gastrointestinal: soft, mild-tender, and distended ? ?Labs:  ? ?  Latest Ref Rng & Units 07/03/2021  ?  5:00 AM 07/02/2021  ?  4:45 AM 07/01/2021  ?  5:13 AM  ?CBC  ?WBC 4.0 - 10.5 K/uL 15.1   12.8   11.9    ?Hemoglobin 12.0 - 15.0 g/dL 8.7   8.5   8.0    ?Hematocrit 36.0 - 46.0 % 27.5   26.4   24.4    ?Platelets 150 - 400 K/uL 360   366   338    ? ? ?  Latest Ref Rng & Units 07/03/2021  ?  5:00 AM 07/02/2021  ?  4:45 AM 07/01/2021  ?  5:13 AM  ?CMP  ?Glucose 70 - 99 mg/dL 122   104   96    ?BUN 8 - 23 mg/dL '13   12   10    '$ ?Creatinine 0.44 - 1.00 mg/dL 0.39   0.37   0.52    ?Sodium 135 - 145 mmol/L 138   136   138    ?Potassium 3.5 - 5.1 mmol/L 3.7   3.8   3.5    ?Chloride 98 - 111 mmol/L 104   104   103    ?CO2 22 - 32 mmol/L '27   27   24    '$ ?Calcium 8.9 - 10.3 mg/dL 8.4   7.9   8.2    ?Total Protein 6.5 - 8.1 g/dL 6.6   6.1   6.2    ?Total Bilirubin 0.3 - 1.2 mg/dL 1.1   1.3   1.5    ?Alkaline Phos 38 - 126 U/L 777   806   770    ?AST 15 - 41 U/L '31   27   31    '$ ?ALT 0 - 44 U/L '16   17   18    '$ ? ? ?Imaging studies: I personally evaluated the images ? ? ?   5 days ago                                                          Today ? ? ?5 days ago  Today ? ? ? ?Assessment/Plan:  ?Patient was evaluated with the images.  CT scan shows significantly worsening ascites.  This may have minutes on the ultrasound.  I think that this is the cause of the abdominal distention.  Contrast seen throughout the small intestine or large intestine.  I will restart diet.  I recommend to consider paracentesis as a diagnostic and therapeutic procedure.  Considered spontaneous bacterial peritonitis due to increased white blood cell count and abdominal pain. ? ?All changes of abdominal distention are related to the increase ascites. No small bowel obstruction or ileus. Continue medical management. Surgery will sign off. May reconsult if any changes.  ? ?This follow up encounter was for 30 minutes most of the time evaluation the images, discussion with radiologis, discussing case with Hospitalist and orienting the patient about CT scan results.  ? ?Gwendolyn Grant, MD ? ? ? ?

## 2021-07-03 NOTE — Consult Note (Addendum)
SURGICAL CONSULTATION NOTE  ? ?HISTORY OF PRESENT ILLNESS (HPI):  ?71 y.o. female presented to [facility] ED for evaluation of abdominal pain. Patient reports she has stage IV pancreatic cancer currently on chemotherapy and about of the radiation therapy.  She was admitted due to recurrent biliary obstruction.  Patient has had multiple biliary stents placed.  Last biliary stent removed and exchanged for a metallic biliary stent. ? ?Today the patient started complaining of worsening abdominal distention.  She had an ultrasound of the abdomen for evaluation of worsening ascites but it shows mild ascites on the ultrasound.  At present evaluated the images.  Abdominal x-ray shows mild small bowel dilation concerning for ileus versus small bowel obstruction.  I personally evaluated the images of the x-ray.  No free air. ? ?Patient endorses that she is passing gas.  Denies any nausea or vomiting.  Pain generalized.  No alleviating or aggravating factors.  No pain radiation. ? ?Surgery is consulted by Dr. Reesa Chew in this context for evaluation and management of abdominal distention. ? ?PAST MEDICAL HISTORY (PMH):  ?Past Medical History:  ?Diagnosis Date  ? Abdominal pain, generalized   ? Anxiety   ? Atypical chest pain   ? Back pain   ? Basal cell carcinoma of skin 2013  ? resected from Left scalp area.   ? COPD (chronic obstructive pulmonary disease) ([place])   ? Depression   ? DOE (dyspnea on exertion)   ? GERD (gastroesophageal reflux disease)   ? Hypertension   ? Pancreatic cancer ([place])   ? Shortness of breath dyspnea   ?  ? ?PAST SURGICAL HISTORY ([place]):  ?Past Surgical History:  ?Procedure Laterality Date  ? BASAL CELL CARCINOMA EXCISION  2013  ? CARPAL TUNNEL RELEASE Left 90s  ? CHOLECYSTECTOMY N/A 09/16/2015  ? Procedure: LAPAROSCOPIC CHOLECYSTECTOMY WITH INTRAOPERATIVE CHOLANGIOGRAM;  Surgeon: Robert Bellow, MD;  Location: [facility] ORS;  Service: General;  Laterality: N/A;  ? COLONOSCOPY  1990"s  ? [place]  ? ENDOSCOPIC  RETROGRADE CHOLANGIOPANCREATOGRAPHY (ERCP) WITH PROPOFOL N/A 11/10/2020  ? Procedure: ENDOSCOPIC RETROGRADE CHOLANGIOPANCREATOGRAPHY (ERCP) WITH PROPOFOL;  Surgeon: Lucilla Lame, MD;  Location: [facility] ENDOSCOPY;  Service: Endoscopy;  Laterality: N/A;  ? ERCP N/A 08/05/2020  ? Procedure: ENDOSCOPIC RETROGRADE CHOLANGIOPANCREATOGRAPHY (ERCP);  Surgeon: Lucilla Lame, MD;  Location: [facility] ENDOSCOPY;  Service: Endoscopy;  Laterality: N/A;  ? ERCP N/A 06/06/2021  ? Procedure: ENDOSCOPIC RETROGRADE CHOLANGIOPANCREATOGRAPHY (ERCP);  Surgeon: Lucilla Lame, MD;  Location: [facility] ENDOSCOPY;  Service: Endoscopy;  Laterality: N/A;  ? ERCP N/A 06/30/2021  ? Procedure: ENDOSCOPIC RETROGRADE CHOLANGIOPANCREATOGRAPHY (ERCP);  Surgeon: Lucilla Lame, MD;  Location: [facility] ENDOSCOPY;  Service: Endoscopy;  Laterality: N/A;  ? ESOPHAGOGASTRODUODENOSCOPY (EGD) WITH PROPOFOL N/A 02/05/2017  ? Procedure: ESOPHAGOGASTRODUODENOSCOPY (EGD) WITH PROPOFOL;  Surgeon: Lollie Sails, MD;  Location: [facility] ENDOSCOPY;  Service: Endoscopy;  Laterality: N/A;  ? PERIPHERAL VASCULAR CATHETERIZATION Bilateral 12/27/2015  ? Procedure: Lower Extremity Angiography;  Surgeon: Katha Cabal, MD;  Location: [place] CV LAB;  Service: Cardiovascular;  Laterality: Bilateral;  ? PORTA CATH INSERTION N/A 09/08/2020  ? Procedure: PORTA CATH INSERTION;  Surgeon: Algernon Huxley, MD;  Location: [place] CV LAB;  Service: Cardiovascular;  Laterality: N/A;  ? TONSILLECTOMY    ?  ? ?MEDICATIONS:  ?Prior to Admission medications   ?Medication Sig Start Date End Date Taking? Authorizing Provider  ?acetaminophen (TYLENOL) 325 MG tablet Take 650 mg by mouth every 6 (six) hours as needed for mild pain or moderate pain.   Yes  [provider]  ?B Complex-C-Folic Acid (B COMPLEX-VITAMIN C-FOLIC ACID) 1 MG tablet Take 1 tablet by mouth daily with breakfast. 08/07/20  Yes Fritzi Mandes, MD  ?Fluticasone-Salmeterol (ADVAIR) 100-50 MCG/DOSE AEPB Inhale 1 puff into the lungs  2 (two) times daily.   Yes [provider]  ?HYDROcodone-acetaminophen (NORCO) 5-325 MG tablet Take 1 tablet by mouth every 12 (twelve) hours as needed for moderate pain. 05/17/21  Yes Cammie Sickle, MD  ?KLOR-CON M20 20 MEQ tablet TAKE 1 TABLET BY MOUTH TWICE A DAY 12/07/20  Yes Cammie Sickle, MD  ?levalbuterol [facility] HFA) 45 MCG/ACT inhaler Inhale 1-2 puffs into the lungs every 6 (six) hours as needed for wheezing or shortness of breath. 12/15/20  Yes Borders, Kirt Boys, NP  ?levofloxacin (LEVAQUIN) 500 MG tablet Take 1 tablet (500 mg total) by mouth daily. 06/26/21  Yes Sindy Guadeloupe, MD  ?lidocaine-prilocaine (EMLA) cream Apply 30 -45 mins prior to port access. 09/08/20  Yes Cammie Sickle, MD  ?megestrol (MEGACE ES) 625 MG/5ML suspension Take 5 mLs (625 mg total) by mouth daily. 06/13/21  Yes Cammie Sickle, MD  ?pantoprazole (PROTONIX) 40 MG tablet TAKE 1 TABLET BY MOUTH EVERY DAY 02/27/21  Yes Cammie Sickle, MD  ?ALPRAZolam Duanne Moron) 0.25 MG tablet Take 1 tablet (0.25 mg total) by mouth every 12 (twelve) hours as needed for anxiety. ?Patient not taking: Reported on 06/13/2021 02/13/21   Cammie Sickle, MD  ?apixaban (ELIQUIS) 5 MG TABS tablet Take 1 tablet (5 mg total) by mouth 2 (two) times daily. ?Patient not taking: Reported on 06/28/2021 06/06/21   Cammie Sickle, MD  ?diphenoxylate-atropine (LOMOTIL) 2.5-0.025 MG tablet Take 1 tablet by mouth 4 (four) times daily as needed for diarrhea or loose stools. ?Patient not taking: Reported on 06/06/2021 10/10/20   Cammie Sickle, MD  ?hydrOXYzine (ATARAX) 25 MG tablet TAKE 1 TABLET BY MOUTH 3 TIMES DAILY AS NEEDED FOR ITCHING. ?Patient not taking: Reported on 06/13/2021 05/11/21   Cammie Sickle, MD  ?loratadine (CLARITIN) 10 MG tablet Take 10 mg by mouth daily. ?Patient not taking: Reported on 06/28/2021    [provider]  ?magic mouthwash w/lidocaine SOLN Take 10 mLs by mouth every 2 (two) hours  as needed for mouth pain. ?Patient not taking: Reported on 04/18/2021 09/16/20   Borders, Kirt Boys, NP  ?montelukast (SINGULAIR) 10 MG tablet Take 1 tablet (10 mg total) by mouth at bedtime. ?Patient not taking: Reported on 06/13/2021 05/15/21   Cammie Sickle, MD  ?ondansetron (ZOFRAN) 8 MG tablet One pill every 8 hours as needed for nausea/vomitting. 10/17/20   Cammie Sickle, MD  ?prochlorperazine (COMPAZINE) 10 MG tablet Take 1 tablet (10 mg total) by mouth every 6 (six) hours as needed for nausea or vomiting. ?Patient not taking: Reported on 06/28/2021 10/17/20   Cammie Sickle, MD  ?  ? ?ALLERGIES:  ?Allergies  ?Allergen Reactions  ? Iodinated Contrast Media Nausea Only and Other (See Comments)  ? Tramadol Other (See Comments)  ?  unknown  ?  ? ?SOCIAL HISTORY:  ?Social History  ? ?Socioeconomic History  ? Marital status: Divorced  ?  Spouse name: Not on file  ? Number of children: Not on file  ? Years of education: Not on file  ? Highest education level: Not on file  ?Occupational History  ? Occupation: retired  ?Tobacco Use  ? Smoking status: Former  ?  Types: Cigarettes  ?  Quit date: 03/02/2012  ?  Years since quitting: 9.3  ? Smokeless tobacco: Never  ?Vaping Use  ? Vaping Use: Never used  ?Substance and Sexual Activity  ? Alcohol use: No  ?  Alcohol/week: 0.0 standard drinks  ? Drug use: No  ? Sexual activity: Not on file  ?Other Topics Concern  ? Not on file  ?Social History Narrative  ? [place]; self; smoker- quit June 22, 2011; daughter died of leukemia-2020. 2 sons [[place]; [place]]; used to be caregiver/ retd.   ? ?Social Determinants of Health  ? ?Financial Resource Strain: Not on file  ?Food Insecurity: Not on file  ?Transportation Needs: Not on file  ?Physical Activity: Not on file  ?Stress: Not on file  ?Social Connections: Not on file  ?Intimate Partner Violence: Not on file  ?  ? ? ?FAMILY HISTORY:  ?Family History  ?Problem Relation Age of Onset  ? Cerebral palsy Daughter   ? Colon  polyps Sister   ? Leukemia Daughter   ? Depression Son   ? Breast cancer Neg Hx   ?  ? ?REVIEW OF SYSTEMS:  ?Constitutional: denies weight loss, positive fever, chills, or sweats  ?Eyes: denies any other

## 2021-07-03 NOTE — Progress Notes (Signed)
Nutrition Follow-up ? ?DOCUMENTATION CODES:  ? ?Underweight, Non-severe (moderate) malnutrition in context of chronic illness ? ?INTERVENTION:  ? ?-Continue MVI with minerals daily ?-Continue 30 ml Prosource Plus TID, each supplement provides 100 kcals and 15 grams protein ? ?NUTRITION DIAGNOSIS:  ? ?Moderate Malnutrition related to chronic illness (pancreatic cancer) as evidenced by mild fat depletion, mild muscle depletion, percent weight loss. ? ?Ongoing ? ?GOAL:  ? ?Patient will meet greater than or equal to 90% of their needs ? ?Progressing  ? ?MONITOR:  ? ?PO intake, Supplement acceptance, Diet advancement, Labs, Weight trends, Skin, I & O's ? ?REASON FOR ASSESSMENT:  ? ?Malnutrition Screening Tool ?  ? ?ASSESSMENT:  ? ?Shirley Barton is a 71 y.o. 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  Right upper quadrant pain about the same.  He has no nausea, vomiting or diarrhea, cough or shortness of breath. ? ?3/31- s/p ERCP- revealed two stents from the common bile duct, once stent removed, one stent placed into common bile duct, inflammation in the antrum and duodenum present, tumor invasion into duodenum cannot be ruled out; advanced to regular diet ?4/2- GI signed off ? ?Reviewed I/O's: +3.3 L x 24 hours and +7.2 L since admission ?  ?Pt unavailable at time of visit. Attempted to speak with pt via call to hospital room phone, however, unable to reach.  ? ?Pt on  regular diet. Pt with improved oral intake. Noted meal completions 25-50%. Pt has been taking Prosource supplements, however, refused AM dose. Pt has tried multiple other supplements and does not accept these well.  ? ?Medications reviewed and include megace.  ? ?Palliative care following; plan for continued current scope of treatment.  ? ?Labs reviewed: CBGS: 124 (inpatient orders for glycemic control are none).   ? ?Diet Order:   ?Diet Order   ? ?       ?  Diet regular Room service appropriate? Yes; Fluid consistency: Thin  Diet effective now       ?  ? ?  ?  ? ?  ? ? ?EDUCATION NEEDS:  ? ?Education needs have been addressed ? ?Skin:  Skin Assessment: Reviewed RN Assessment ? ?Last BM:  Unknown ? ?Height:  ? ?Ht Readings from Last 1 Encounters:  ?06/30/21 5' 3.5" (1.613 m)  ? ? ?Weight:  ? ?Wt Readings from Last 1 Encounters:  ?06/30/21 42.2 kg  ? ? ?Ideal Body Weight:  53.4 kg ? ?BMI:  Body mass index is 16.22 kg/m?. ? ?Estimated Nutritional Needs:  ? ?Kcal:  1300-1500 ? ?Protein:  70-85 grams ? ?Fluid:  > 1.3 L ? ? ? ?Loistine Chance, RD, LDN, CDCES ?Registered Dietitian II ?Certified Diabetes Care and Education Specialist ?Please refer to [facility] for RD and/or RD on-call/weekend/after hours pager  ?

## 2021-07-04 ENCOUNTER — Inpatient Hospital Stay: Payer: Medicare HMO

## 2021-07-04 DIAGNOSIS — C801 Malignant (primary) neoplasm, unspecified: Secondary | ICD-10-CM | POA: Diagnosis not present

## 2021-07-04 DIAGNOSIS — K831 Obstruction of bile duct: Secondary | ICD-10-CM | POA: Diagnosis not present

## 2021-07-04 LAB — CULTURE, BLOOD (ROUTINE X 2)
Culture: NO GROWTH
Culture: NO GROWTH
Special Requests: ADEQUATE

## 2021-07-04 LAB — GLUCOSE, PLEURAL OR PERITONEAL FLUID: Glucose, Fluid: 99 mg/dL

## 2021-07-04 LAB — COMPREHENSIVE METABOLIC PANEL
ALT: 14 U/L (ref 0–44)
AST: 27 U/L (ref 15–41)
Albumin: 1.9 g/dL — ABNORMAL LOW (ref 3.5–5.0)
Alkaline Phosphatase: 619 U/L — ABNORMAL HIGH (ref 38–126)
Anion gap: 5 (ref 5–15)
BUN: 10 mg/dL (ref 8–23)
CO2: 28 mmol/L (ref 22–32)
Calcium: 8 mg/dL — ABNORMAL LOW (ref 8.9–10.3)
Chloride: 105 mmol/L (ref 98–111)
Creatinine, Ser: 0.39 mg/dL — ABNORMAL LOW (ref 0.44–1.00)
GFR, Estimated: >60 mL/min (ref >60)
Glucose, Bld: 90 mg/dL (ref 70–99)
Potassium: 3.8 mmol/L (ref 3.5–5.1)
Sodium: 138 mmol/L (ref 135–145)
Total Bilirubin: 1.2 mg/dL (ref 0.3–1.2)
Total Protein: 5.9 g/dL — ABNORMAL LOW (ref 6.5–8.1)

## 2021-07-04 LAB — BODY FLUID CELL COUNT WITH DIFFERENTIAL
Eos, Fluid: 0 %
Lymphs, Fluid: 14 %
Monocyte-Macrophage-Serous Fluid: 26 %
Neutrophil Count, Fluid: 60 %
Total Nucleated Cell Count, Fluid: 375 cu mm

## 2021-07-04 LAB — ALBUMIN, PLEURAL OR PERITONEAL FLUID: Albumin, Fluid: <1.5 g/dL

## 2021-07-04 LAB — CBC
HCT: 25.3 % — ABNORMAL LOW (ref 36.0–46.0)
Hemoglobin: 8 g/dL — ABNORMAL LOW (ref 12.0–15.0)
MCH: 27 pg (ref 26.0–34.0)
MCHC: 31.6 g/dL (ref 30.0–36.0)
MCV: 85.5 fL (ref 80.0–100.0)
Platelets: 279 10*3/uL (ref 150–400)
RBC: 2.96 MIL/uL — ABNORMAL LOW (ref 3.87–5.11)
RDW: 18.6 % — ABNORMAL HIGH (ref 11.5–15.5)
WBC: 10.6 10*3/uL — ABNORMAL HIGH (ref 4.0–10.5)
nRBC: 0 % (ref 0.0–0.2)

## 2021-07-04 LAB — LACTATE DEHYDROGENASE, PLEURAL OR PERITONEAL FLUID: LD, Fluid: 20 U/L (ref 3–23)

## 2021-07-04 IMAGING — US US PARACENTESIS
1 series · 8 of 8 positions shown · non-contrast
Comparison: none

INDICATION: Pancreatic cancer with biliary obstruction and ascites request
received for diagnostic and therapeutic paracentesis.

[Series 1: us paracentesis · 0.26mm/px · 8 of 8 slices shown]
[im 1/8]
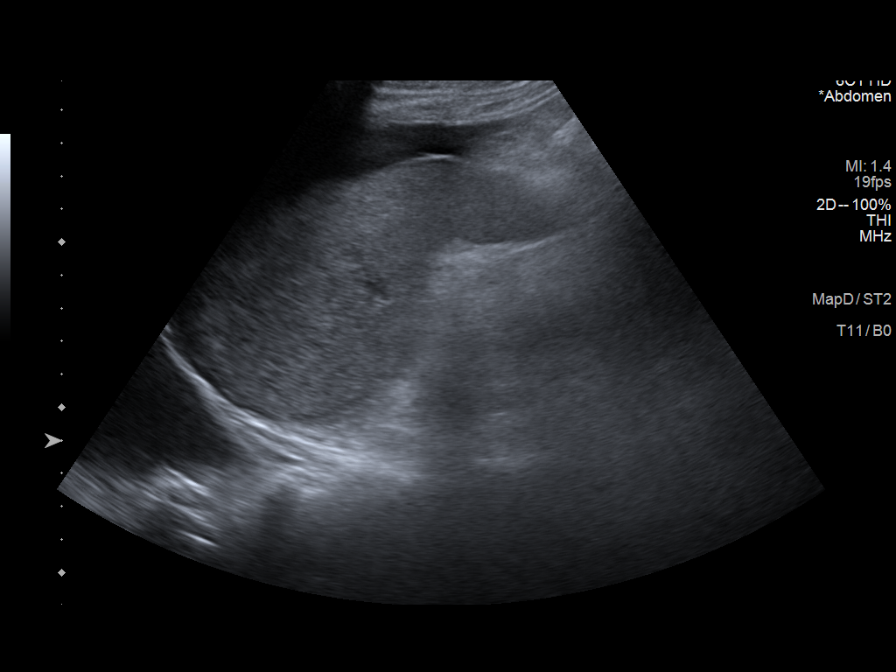
[im 2/8]
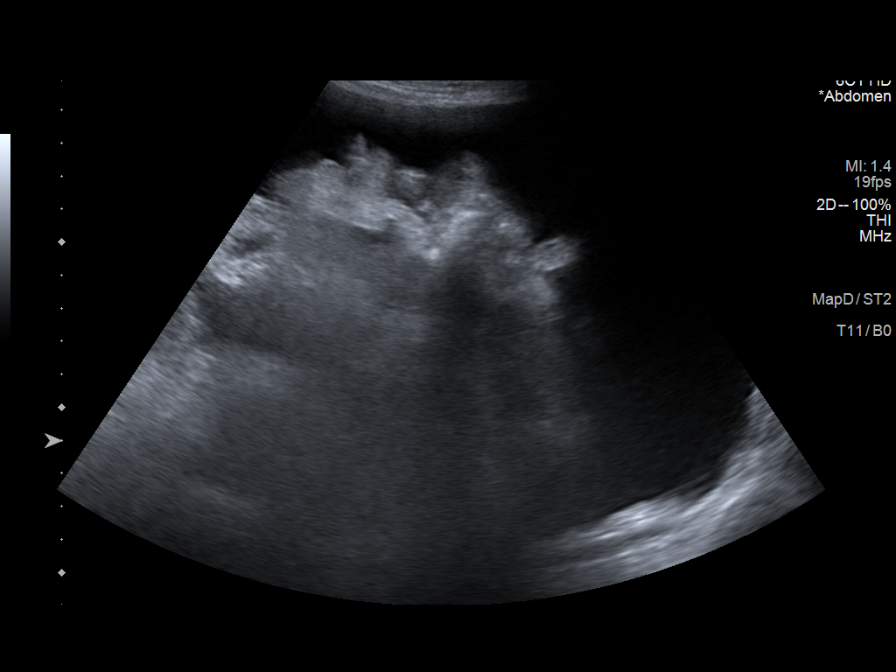
[im 3/8]
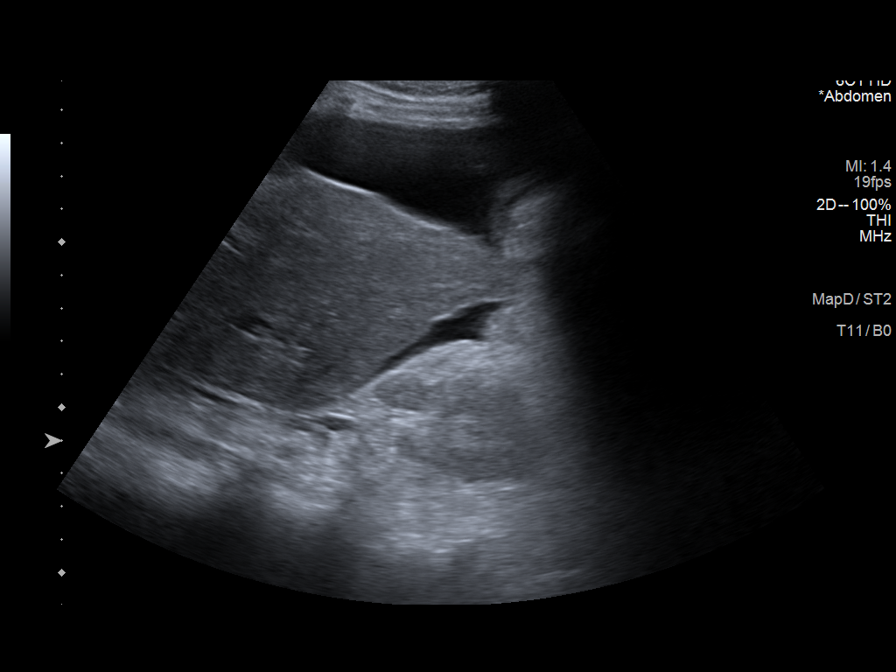
[im 4/8]
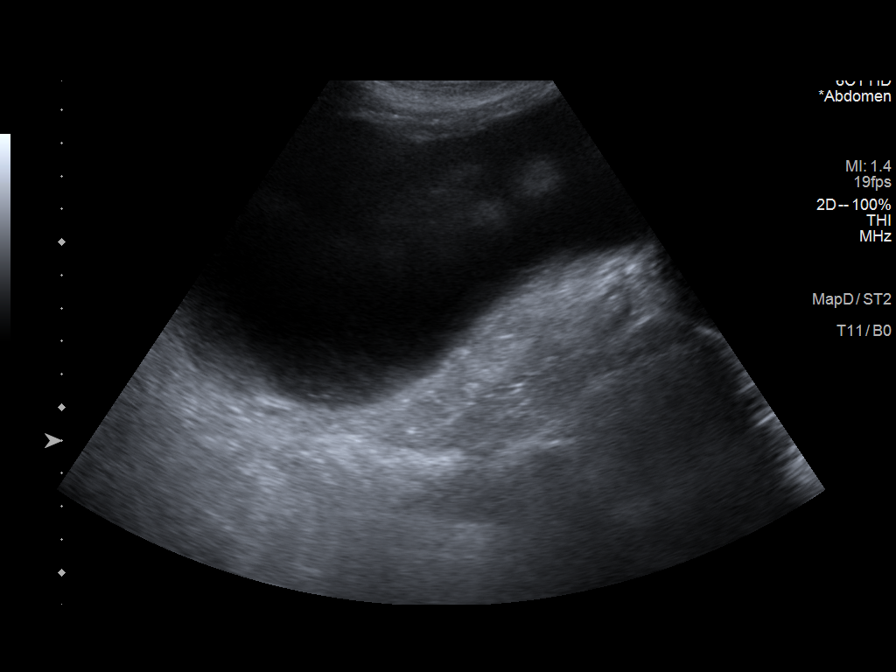
[im 5/8]
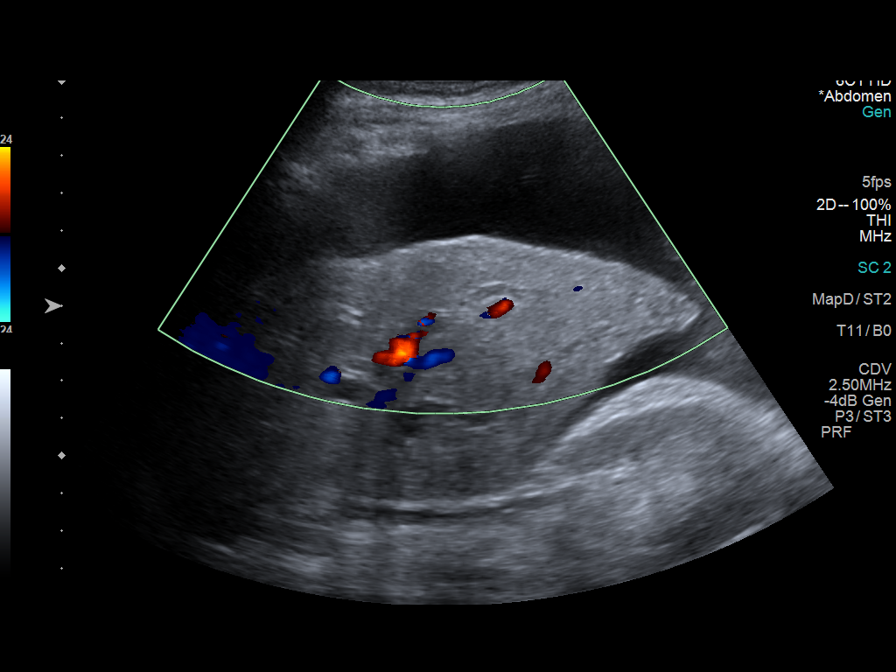
[im 6/8]
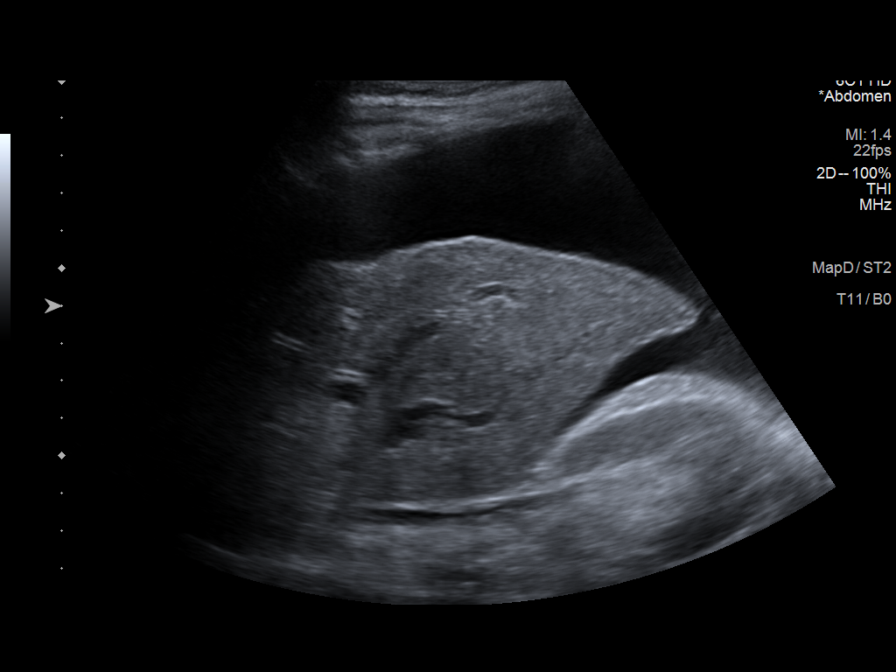
[im 7/8]
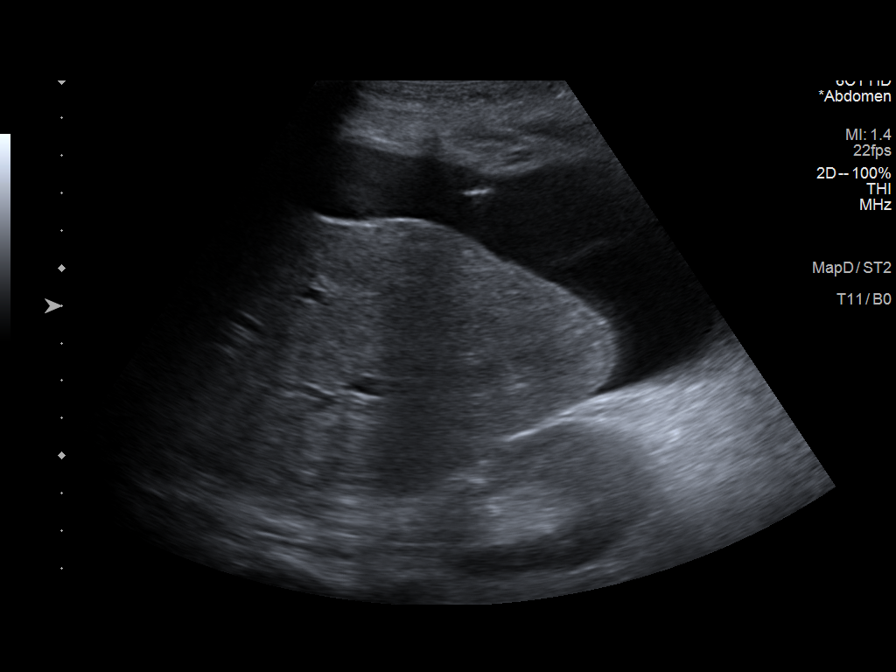
[im 8/8]
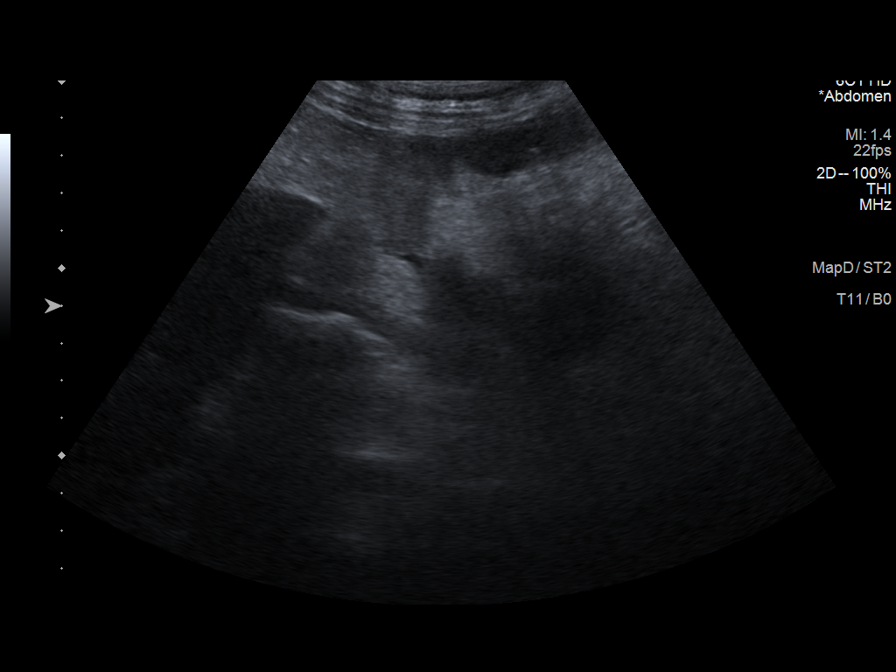

[8 of 8 positions shown; findings below may reference images not displayed]

EXAM:
ULTRASOUND GUIDED  PARACENTESIS

MEDICATIONS:
Local 1% lidocaine only.

COMPLICATIONS:
None immediate.

PROCEDURE:
Informed written consent was obtained from the patient after a
discussion of the risks, benefits and alternatives to treatment. A
timeout was performed prior to the initiation of the procedure.

Initial ultrasound scanning demonstrates a small amount of ascites
within the right upper abdominal quadrant. The right upper abdomen
was prepped and draped in the usual sterile fashion. 1% lidocaine
was used for local anesthesia.

Following this, a 19 gauge, 7-cm, Yueh catheter was introduced. An
ultrasound image was saved for documentation purposes. The
paracentesis was performed. The catheter was removed and a dressing
was applied. The patient tolerated the procedure well without
immediate post procedural complication.
FINDINGS: A total of approximately 2.5 L of clear yellow fluid was removed.
Samples were sent to the laboratory as requested by the clinical
team.
IMPRESSION: Successful ultrasound-guided paracentesis yielding 2.5 liters of
peritoneal fluid.

This exam was performed by HUAT, and was supervised
and interpreted by Dr. HUAT.

## 2021-07-04 MED ORDER — POLYETHYLENE GLYCOL 3350 17 G PO PACK
17.0000 g | PACK | Freq: Every day | ORAL | Status: DC
  Administered 2021-07-04 – 2021-07-05 (×2): 17 g via ORAL
  Filled 2021-07-04 (×2): qty 1

## 2021-07-04 NOTE — Progress Notes (Signed)
?Progress Note ? ? ?Patient: Shirley Barton ERX:[id number] DOB: [birth year] DOA: 06/28/2021     6 ?DOS: the patient was seen and examined on 07/04/2021 ?  ?Brief hospital course: ?Taken from prior notes. ? ?Shirley Barton is a 71 y.o. 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  Right upper quadrant pain about the same.  He has no nausea, vomiting or diarrhea, cough or shortness of breath. ?ED course: On arrival afebrile, tachycardic to 114, tachypneic to 23 with initially normal BP with subsequently went as low as 103/57 just prior to admission.  Blood work reveals WBC of 14,000 with lactic acid 1.5 hemoglobin 6.2 down from 7.4 on 3/27.  Lipase 67, with alk phos 827, up from 598 on 3/27 and total bilirubin 1.4 up from 1.0 on 3/27.  BUN/creatinine WNL urinalysis with 5 ketones small leukocytes negative nitrites and rare bacteria. ? ?CT abdomen was obtained on admission-please see the full report. ? ?The emergency room provider spoke with a biliary specialist at [facility], Dr. Jola Schmidt, at the recommendation of local GI, Dr. Alice Reichert to request transfer however there was no bed availability.  Dr. Alice Reichert subsequently agreed to consult with Dr. Allen Norris for placement of biliary stent here . ?S/p biliary stent placement with ERCP.  Patient tolerated the procedure well. ? ?Continue to have worsening abdominal distention and pain. ?KUB with dilated small bowel loops. ?Abdominal ultrasound  with mild ascites. ? ?General surgery was consulted for concern of partial small bowel obstruction. ? ?Palliative care was also consulted but patient would like to remain full code with full scope of medical management.  Palliative care with follow-up at cancer center as an outpatient. ? ?4/4: CT abdomen with significant ascites and no bowel obstruction.  Ultrasound-guided paracentesis was ordered, removal of 2.5 L of fluid, appears transudate with normal LDH and low protein, total nucleated cells of 375 with 60% neutrophil, patient received antibiotics for concern of cholangitis and completed the course today. ?Continue to have abdominal pain, much less distention today. ?Last bowel movement was on Saturday-MiraLAX ordered. ?If remains stable can be discharged home with home health services tomorrow. ? ? ?Assessment and Plan: ?* Biliary obstruction due to cancer [facility]) ?Patient with stage IV pancreatic cancer.  Admitted for recurrent biliary obstruction. ?Patient has had multiple biliary stents placed.  Last biliary stent removed and exchanged for a metallic biliary stent. ?Continue to have abdominal pain. ?-Continue with pain management ?-Abdominal CT with no small bowel obstruction and significant ascites, paracentesis was done with removal of 2.5 L of transudative fluid. ?-Completed the course of antibiotics for concern of cholangitis. ? ?Abdominal distention ?Patient developed worsening abdominal distention and pain. ?Abdominal ultrasound with mild ascites. ?Abdominal x-ray with concern of ileus/partial obstruction with dilated bowel. ?General surgery was consulted and they ordered abdominal CT with contrast. ?Which shows significant ascites and no small bowel obstruction. ?Paracentesis was done today with removal of transudative 2.5 L of fluid, cultures pending.  Significant improvement in distention, still have some pain but  stating that it is improved as compared to before. ?-Continue with pain  management ?-If remains stable can be discharged home tomorrow ? ?Malignant neoplasm of head of pancreas ([place]) ?- Being followed up with oncology as an outpatient. ? ?Sepsis ([place]) ?Suspected etiology includes acute cholangitis given WBC 20,000 on 3/27, fever of 100.4 in oncology office on 3/29, worsening biliary obstruction, malaise ?Cefepime Flagyl and Vanco for intra-abdominal/sepsis of unknown source, concerning for cholangitis ?Cultures remain negative. ?-She will complete the course of cefepime and Flagyl today. ? ?Chronic obstructive pulmonary disease ([place]) ?Continue Advair with DuoNebs as needed ? ?Malnutrition of moderate degree ?Estimated body mass index is 16.22 kg/m? as calculated from the following: ?  Height as of this encounter: 5' 3.5" (1.613 m). ?  Weight as of this encounter: 42.2 kg.  ? ?-Dietitian consult ? 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 ?Depression ?Hold Xanax ? ?Essential (primary) hypertension ?Hold home antihypertensives due to soft blood pressures ? ? ?  ? ?Subjective: Patient continued to have some abdominal pain, better than before.  Last bowel movement was on Saturday. ? ?Physical Exam: ?Vitals:  ? 07/04/21 0354 07/04/21 0804 07/04/21 0900 07/04/21 1557  ?BP: 121/75 (!) 146/84 113/68 (!) 115/91  ?Pulse: 89 99 99 96  ?Resp: _0 ?Temp: 98.2 ?F (36.8 ?C) 98.4 ?F (36.9 ?C)  98 ?F (36.7 ?C)  ?TempSrc:    Oral  ?SpO2: 100% 94% 100% 100%  ?Weight:      ?Height:      ? ?General.  Ill-appearing, cachectic lady, in no acute distress. ?Pulmonary.  Lungs clear bilaterally, normal respiratory effort. ?CV.  Regular rate and rhythm, no JVD, rub or murmur. ?Abdomen.  Soft, nontender, nondistended, BS positive. ?CNS.  Alert and oriented .  No focal neurologic deficit. ?Extremities.  No edema, no cyanosis, pulses intact and symmetrical. ?Psychiatry.  Judgment and insight appears normal. ? ?Data Reviewed: ?Prior notes and labs  reviewed ? ?Family Communication: Discussed with son on phone ? ?Disposition: ?Status is: Inpatient ?Remains inpatient appropriate because: Severity of illness ? ? Planned Discharge Destination: Home with Home Health ? ?Time spent: 40 minutes ? ?This record has been created using Systems analyst. Errors have been sought and corrected,but may not always be located. Such creation errors do not reflect on the standard of care. ? ?Author: ?Lorella Nimrod, MD ?07/04/2021 4:35 PM ? ?For on call review [email or web address].  ?

## 2021-07-04 NOTE — Progress Notes (Signed)
PT Cancellation Note ? ?Patient Details ?Name: Shirley Barton ?MRN: [id number] ?DOB: May 17, 1950 ? ? ?Cancelled Treatment:     Will hold on PT today and await new orders. Pt currently trying to prep for surgery due to possible bowel obstruction.  ? ? ?Josie Dixon ?07/04/2021, 9:51 AM ?

## 2021-07-04 NOTE — Assessment & Plan Note (Signed)
Patient with stage IV pancreatic cancer.  Admitted for recurrent biliary obstruction. ?Patient has had multiple biliary stents placed.  Last biliary stent removed and exchanged for a metallic biliary stent. ?Continue to have abdominal pain. ?-Continue with pain management ?-Abdominal CT with no small bowel obstruction and significant ascites, paracentesis was done with removal of 2.5 L of transudative fluid. ?-Completed the course of antibiotics for concern of cholangitis. ?

## 2021-07-04 NOTE — Procedures (Signed)
PROCEDURE SUMMARY: ? ?Successful US guided paracentesis from RUQ.  ?Yielded 2.5 L of clear yellow fluid.  ?No immediate complications.  ?Pt tolerated well.  ? ?Specimen was sent for labs. ? ?EBL <  5m ? ?MTsosie BillingD PA-C ?07/04/2021 ?10:16 AM ? ? ? ?

## 2021-07-04 NOTE — Assessment & Plan Note (Signed)
Suspected etiology includes acute cholangitis given WBC 20,000 on 3/27, fever of 100.4 in oncology office on 3/29, worsening biliary obstruction, malaise ?Cefepime Flagyl and Vanco for intra-abdominal/sepsis of unknown source, concerning for cholangitis ?Cultures remain negative. ?-She will complete the course of cefepime and Flagyl today. ?

## 2021-07-04 NOTE — TOC Progression Note (Signed)
Transition of Care (TOC) - Progression Note  ? ? ?Patient Details  ?Name: JERMIA RIGSBY ?MRN: [id number] ?Date of Birth: May 08, 1950 ? ?Transition of Care (TOC) CM/SW Contact  ?Pete Pelt, RN ?Phone Number: ?07/04/2021, 3:40 PM ? ?Clinical Narrative:   PT recommends rolling walker.  Ordered by [employer]. ? ? ? ?Expected Discharge Plan: Home/Self Care ?Barriers to Discharge: Continued Medical Work up ? ?Expected Discharge Plan and Services ?Expected Discharge Plan: Home/Self Care ?  ?  ?  ?  ?                ?  ?  ?  ?  ?  ?  ?  ?  ?  ?  ? ? ?Social Determinants of Health (SDOH) Interventions ?  ? ?Readmission Risk Interventions ? ?  06/30/2021  ? 10:24 AM  ?Readmission Risk Prevention Plan  ?Transportation Screening Complete  ?PCP or Specialist Appt within 3-5 Days Complete  ?[place] or Home Care Consult Complete  ?Social Work Consult for [place] Planning/Counseling Complete  ?Palliative Care Screening Not Applicable  ?Medication Review Press photographer) Complete  ? ? ?

## 2021-07-04 NOTE — TOC Progression Note (Addendum)
Transition of Care (TOC) - Progression Note  ? ? ?Patient Details  ?Name: Shirley Barton ?MRN: [id number] ?Date of Birth: 06/24/1950 ? ?Transition of Care (TOC) CM/SW Contact  ?Pete Pelt, RN ?Phone Number: ?07/04/2021, 2:15 PM ? ?Clinical Narrative:   Patient lives alone, son lives near and helps her with care, transportation and medication pick up.  Patient states son works wed to Sat and schedules her appointments around his work schedule. ? ?Patient feels comfortable to return home when medically discharged.  She accepts Home Health PT OT RN.  Patient does not have BSC, states she feels she does not need one due to proximity of bathroom to her bed.  Patient requests walker, she states she does not have one at home.  [employer] notified of need, will deliver.   ? ?Toc to follow, patient states she does not feel she has further TOC needs at this time. ? ?ADDENDUM 1503 hours:  Notified Sarah at [facility], she will submit to her office and advise RNCM if they can accept.  [facility] cannot accept at this time. ? ?Expected Discharge Plan: Home/Self Care ?Barriers to Discharge: Continued Medical Work up ? ?Expected Discharge Plan and Services ?Expected Discharge Plan: Home/Self Care ?  ?  ?  ?  ?                ?  ?  ?  ?  ?  ?  ?  ?  ?  ?  ? ? ?Social Determinants of Health (SDOH) Interventions ?  ? ?Readmission Risk Interventions ? ?  06/30/2021  ? 10:24 AM  ?Readmission Risk Prevention Plan  ?Transportation Screening Complete  ?PCP or Specialist Appt within 3-5 Days Complete  ?[place] or Home Care Consult Complete  ?Social Work Consult for [place] Planning/Counseling Complete  ?Palliative Care Screening Not Applicable  ?Medication Review Press photographer) Complete  ? ? ?

## 2021-07-04 NOTE — Assessment & Plan Note (Signed)
Patient developed worsening abdominal distention and pain. ?Abdominal ultrasound with mild ascites. ?Abdominal x-ray with concern of ileus/partial obstruction with dilated bowel. ?General surgery was consulted and they ordered abdominal CT with contrast. ?Which shows significant ascites and no small bowel obstruction. ?Paracentesis was done today with removal of transudative 2.5 L of fluid, cultures pending.  Significant improvement in distention, still have some pain but stating that it is improved as compared to before. ?-Continue with pain management ?-If remains stable can be discharged home tomorrow ?

## 2021-07-05 DIAGNOSIS — K831 Obstruction of bile duct: Secondary | ICD-10-CM | POA: Diagnosis not present

## 2021-07-05 DIAGNOSIS — C801 Malignant (primary) neoplasm, unspecified: Secondary | ICD-10-CM | POA: Diagnosis not present

## 2021-07-05 LAB — CYTOLOGY - NON PAP

## 2021-07-05 MED ORDER — PROSOURCE PLUS PO LIQD: 30.0000 mL | Freq: Three times a day (TID) | ORAL | 3 refills | Status: AC

## 2021-07-05 MED ORDER — HYDROCODONE-ACETAMINOPHEN 5-325 MG PO TABS: 1.0000 | ORAL_TABLET | Freq: Four times a day (QID) | ORAL | 0 refills | Status: AC | PRN

## 2021-07-05 MED ORDER — ADULT MULTIVITAMIN W/MINERALS CH: 1.0000 | ORAL_TABLET | Freq: Every day | ORAL | 0 refills | Status: AC

## 2021-07-05 MED ORDER — POLYETHYLENE GLYCOL 3350 17 G PO PACK: 17.0000 g | PACK | Freq: Every day | ORAL | 0 refills | Status: AC

## 2021-07-05 MED ORDER — PANTOPRAZOLE SODIUM 40 MG PO TBEC: 40.0000 mg | DELAYED_RELEASE_TABLET | Freq: Two times a day (BID) | ORAL | Status: DC

## 2021-07-05 NOTE — Progress Notes (Signed)
SATURATION QUALIFICATIONS: (This note is used to comply with regulatory documentation for home oxygen) ? ?Patient Saturations on Room Air at Rest = 90% ? ?Patient Saturations on Room Air while Ambulating = 93% ? ?Patient Saturations on 0 Liters of oxygen while Ambulating = 93% ? ?Please was short of breath the entire time she was ambulating.   ?

## 2021-07-05 NOTE — Care Management Important Message (Signed)
Important Message ? ?Patient Details  ?Name: Shirley Barton ?MRN: [id number] ?Date of Birth: 10/15/1950 ? ? ?Medicare Important Message Given:  Yes ? ? ? ? ?[name] [name] ?07/05/2021, 11:07 AM ?

## 2021-07-05 NOTE — Progress Notes (Signed)
Ambulated to nursing desk and back on room air.  Oxygen Saturations did not fall below 93% ?

## 2021-07-05 NOTE — Discharge Summary (Signed)
?Physician Discharge Summary ?  ?Patient: Shirley Barton MRN: [id number] DOB: [birth year]  ?Admit date:     06/28/2021  ?Discharge date: 07/05/21  ?Discharge Physician: Lorella Nimrod  ? ?PCP: Erma Heritage, MD  ? ?Recommendations at discharge:  ?Please obtain CBC and CMP in 1 week ?Follow-up with your oncologist for further recommendations. ? ?Discharge Diagnoses: ?Principal Problem: ?  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  Malignant neoplasm of head of pancreas ([place]) ?  Abdominal distention ?  Abdominal pain ?  Sepsis ([place]) ?  Chronic obstructive pulmonary disease (HCC) ?  Malnutrition of moderate degree ?  AAA (abdominal aortic aneurysm) ([place]) ?  Palliative care encounter ? ? ?Hospital Course: ?Taken from prior notes. ? ?Shirley Barton is a 71 y.o. 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  Right upper quadrant pain about the same.  He has no nausea, vomiting or diarrhea, cough or shortness of breath. ?ED course: On arrival afebrile, tachycardic to 114, tachypneic to 23 with initially normal BP with subsequently went as low as 103/57 just prior to admission.  Blood work reveals WBC of 14,000 with lactic acid 1.5 hemoglobin 6.2 down from 7.4 on 3/27.  Lipase 67, with alk phos 827, up from 598 on 3/27 and total bilirubin 1.4 up from 1.0 on 3/27.  BUN/creatinine WNL urinalysis with 5 ketones small leukocytes negative nitrites and rare  bacteria. ? ?CT abdomen was obtained on admission-please see the full report. ? ?The emergency room provider spoke with a biliary specialist at [facility], Dr. Jola Schmidt, at the recommendation of local GI, Dr. Alice Reichert to request transfer however there was no bed availability.  Dr. Alice Reichert subsequently agreed to consult with Dr. Allen Norris for placement of biliary stent here . ?S/p biliary stent placement with ERCP.  Patient tolerated the procedure well. ? ?Continue to have worsening abdominal distention and pain. ?KUB with dilated small bowel loops. ?Abdominal ultrasound with mild ascites. ? ?General surgery was consulted for concern of partial small bowel obstruction. ? ?Palliative care was also consulted but patient would like to remain full code with full scope of medical management.  Palliative care with follow-up at cancer center as an outpatient. ? ?4/4: CT abdomen with significant ascites and no bowel obstruction.  Ultrasound-guided paracentesis was ordered, removal of 2.5 L of fluid, appears transudate with normal LDH and low protein, total nucleated cells of 375 with 60% neutrophil, patient received antibiotics for concern of cholangitis and completed the course today. ?Continue to have abdominal pain, much less distention today. ?Last bowel movement was on Saturday-MiraLAX ordered. ?If remains stable can be discharged home with home health services tomorrow. ? ?4/5: Patient remained stable and no new complaints when seen today.  She is being discharged home with home health services and need to have a close follow-up with her oncologist for further recommendations.  She was provided with pain medications and should avoid constipation. ? ?She will continue current medications and follow-up with her providers. ? 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  Admitted for recurrent biliary obstruction. ?Patient has had multiple biliary stents placed.  Last  biliary stent removed and exchanged for a metallic biliary stent. ?Continue to have abdominal pain. ?-Continue with pain management ?-Abdominal CT with no small bowel obstruction and significant ascites, paracentesis was done with removal of 2.5 L of transudative fluid. ?-Completed the course of antibiotics for concern of cholangitis. ? ?Abdominal distention ?Patient developed worsening abdominal distention and pain. ?Abdominal ultrasound with mild ascites. ?Abdominal x-ray with concern of ileus/partial obstruction with dilated bowel. ?General surgery was consulted and they ordered abdominal CT with contrast. ?Which shows significant ascites and no small bowel obstruction. ?Paracentesis was done today with removal of transudative 2.5 L of fluid, cultures pending.  Significant improvement in distention, still have some pain but stating that it is improved as compared to before. ?-Continue with pain management ?-If remains stable can be discharged home tomorrow ? ?Malignant neoplasm of head of pancreas ([place]) ?- Being followed up with oncology as an outpatient. ? ?Sepsis ([place]) ?Suspected etiology includes acute cholangitis given WBC 20,000 on 3/27, fever of 100.4 in oncology office on 3/29, worsening biliary obstruction, malaise ?Cefepime Flagyl and Vanco for intra-abdominal/sepsis of unknown source, concerning for cholangitis ?Cultures remain negative. ?-She will complete the course of cefepime and Flagyl today. ? ?Chronic obstructive pulmonary disease ([place]) ?Continue Advair with DuoNebs as needed ? ?Malnutrition of moderate degree ?Estimated body mass index is 16.22 kg/m? as calculated from the following: ?  Height as of this encounter: 5' 3.5" (1.613 m). ?  Weight as of this encounter: 42.2 kg.  ? ?-Dietitian consult ? 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 ?Depression ?Hold Xanax ? ?Essential (primary) hypertension ?Hold home antihypertensives due to soft  blood pressures ? ? ? ? ?  ? ?Pain control - [employer] Controlled Substance Reporting System database was reviewed. and patient was instructed, not to drive, operate heavy machinery, perform activities at heights, swimming or participation in water activities or provide baby-sitting services while on Pain, Sleep and Anxiety Medications; until their outpatient Physician has advised to do so again. Also recommended to not to take more than prescribed Pain, Sleep and Anxiety Medications.  ?Consultants: Gastroenterology, general surgery, IR ?Procedures performed: ERCP, paracentesis ?Disposition: Home health ?Diet recommendation:  ?Discharge Diet Orders (From admission, onward)  ? ?  Start     Ordered  ? 07/05/21 0000  Diet - low sodium heart healthy       ? 07/05/21 1054  ? ?  ?  ? ?  ? ?Regular diet ?DISCHARGE MEDICATION: ?Allergies as of 07/05/2021   ? ?   Reactions  ? Tramadol Other (See Comments)  ? unknown  ? ?  ? ?  ?Medication List  ?  ? ?STOP taking these medications   ? ?ALPRAZolam 0.25 MG tablet ?Commonly known as: Duanne Moron ?  ?apixaban 5 MG Tabs tablet ?Commonly known as: ELIQUIS ?  ?diphenoxylate-atropine 2.5-0.025 MG tablet ?Commonly known as: LOMOTIL ?  ?Klor-Con M20 20 MEQ tablet ?Generic drug: potassium chloride SA ?  ?levofloxacin 500 MG tablet ?Commonly known as: Levaquin ?  ?prochlorperazine 10 MG tablet ?Commonly known as: COMPAZINE ?  ? ?  ? ?TAKE these medications   ? ?(feeding supplement) PROSource Plus liquid ?Take 30 mLs by mouth 3 (three) times daily between meals. ?  ?acetaminophen 325 MG tablet ?Commonly known as:  TYLENOL ?Take 650 mg by mouth every 6 (six) hours as needed for mild pain or moderate pain. ?  ?B complex-vitamin C-folic acid 1 MG tablet ?Take 1 tablet by mouth daily with breakfast. ?  ?Fluticasone-Salmeterol 100-50 MCG/DOSE Aepb ?Commonly known as: ADVAIR ?Inhale 1 puff into the lungs 2 (two) times daily. ?  ?HYDROcodone-acetaminophen 5-325 MG tablet ?Commonly known as:  Norco ?Take 1 tablet by mouth every 6 (six) hours as needed for moderate pain. ?What changed: when to take this ?  ?hydrOXYzine 25 MG tablet ?Commonly known as: ATARAX ?TAKE 1 TABLET BY MOUTH 3 TIMES DAILY AS NEEDED FOR ITC

## 2021-07-07 ENCOUNTER — Inpatient Hospital Stay: Payer: Medicare HMO | Attending: Internal Medicine

## 2021-07-07 DIAGNOSIS — R197 Diarrhea, unspecified: Secondary | ICD-10-CM | POA: Insufficient documentation

## 2021-07-07 DIAGNOSIS — J449 Chronic obstructive pulmonary disease, unspecified: Secondary | ICD-10-CM | POA: Insufficient documentation

## 2021-07-07 DIAGNOSIS — Z806 Family history of leukemia: Secondary | ICD-10-CM | POA: Insufficient documentation

## 2021-07-07 DIAGNOSIS — Z87891 Personal history of nicotine dependence: Secondary | ICD-10-CM | POA: Insufficient documentation

## 2021-07-07 DIAGNOSIS — C250 Malignant neoplasm of head of pancreas: Secondary | ICD-10-CM | POA: Insufficient documentation

## 2021-07-07 DIAGNOSIS — F419 Anxiety disorder, unspecified: Secondary | ICD-10-CM | POA: Insufficient documentation

## 2021-07-07 DIAGNOSIS — Z923 Personal history of irradiation: Secondary | ICD-10-CM | POA: Insufficient documentation

## 2021-07-07 DIAGNOSIS — I10 Essential (primary) hypertension: Secondary | ICD-10-CM | POA: Insufficient documentation

## 2021-07-07 DIAGNOSIS — R188 Other ascites: Secondary | ICD-10-CM | POA: Insufficient documentation

## 2021-07-07 LAB — BODY FLUID CULTURE W GRAM STAIN: Culture: NO GROWTH

## 2021-07-07 LAB — LIPASE, FLUID: Lipase-Fluid: 40 U/L

## 2021-07-07 MED FILL — Dexamethasone Sodium Phosphate Inj 100 MG/10ML: INTRAMUSCULAR | Qty: 1 | Status: AC

## 2021-07-07 NOTE — Progress Notes (Signed)
Nutrition Follow-up: ? ?Patient with pancreatic cancer.  Noted recent admission to the hospital from biliary obstruction, sepsis.  Treatment currently on hold.  ? ?Spoke with patient via phone.  Patient discharged on 4/5.  Says that she does not feel good.  Eating a little bit.  Says that she ate some pizza.  Does not really like the oral nutrition supplement shakes anymore.  Talked about the protein modular that she took in the hospital.  She is taking the megace.   ? ? ? ?Medications: reviewed ? ?Labs: reviewed ? ?Anthropometrics:  ? ?Weight noted 93 lb on 3/31 (hospital admission) ? ?96 lb 12.8 oz on 3/14 ?100 lb 9.6 oz on 2/13 ?100 lb on 11/18 ?97 lb 3.2 oz on 10/17 ?92 lb on 8/15 ?98 lb on 7/11 ?100 lb on 6/20 ? ? ?NUTRITION DIAGNOSIS: Inadequate oral intake ongoing ? ? ?INTERVENTION:  ?Continue appetite stimulant ?Answered questions regarding how to get protein modular.   ?Small frequent snacks as able ?  ? ?MONITORING, EVALUATION, GOAL: weight trends, intake ? ? ?NEXT VISIT: Friday, May 5, phone ? ?Shirley Barton, RD, LDN ?Registered Dietitian ?[phone] ? ? ?

## 2021-07-10 ENCOUNTER — Inpatient Hospital Stay (HOSPITAL_BASED_OUTPATIENT_CLINIC_OR_DEPARTMENT_OTHER): Payer: Medicare HMO | Admitting: Internal Medicine

## 2021-07-10 ENCOUNTER — Encounter: Payer: Self-pay | Admitting: Internal Medicine

## 2021-07-10 ENCOUNTER — Telehealth: Payer: Self-pay

## 2021-07-10 ENCOUNTER — Telehealth: Payer: Self-pay | Admitting: Internal Medicine

## 2021-07-10 VITALS — BP 121/86 | HR 99 | Temp 97.8°F | Ht 63.5 in | Wt 101.1 lb

## 2021-07-10 DIAGNOSIS — R188 Other ascites: Secondary | ICD-10-CM | POA: Diagnosis not present

## 2021-07-10 DIAGNOSIS — R180 Malignant ascites: Secondary | ICD-10-CM | POA: Diagnosis not present

## 2021-07-10 DIAGNOSIS — Z806 Family history of leukemia: Secondary | ICD-10-CM | POA: Diagnosis not present

## 2021-07-10 DIAGNOSIS — I10 Essential (primary) hypertension: Secondary | ICD-10-CM | POA: Diagnosis not present

## 2021-07-10 DIAGNOSIS — Z87891 Personal history of nicotine dependence: Secondary | ICD-10-CM | POA: Diagnosis not present

## 2021-07-10 DIAGNOSIS — F419 Anxiety disorder, unspecified: Secondary | ICD-10-CM | POA: Diagnosis not present

## 2021-07-10 DIAGNOSIS — Z923 Personal history of irradiation: Secondary | ICD-10-CM | POA: Diagnosis not present

## 2021-07-10 DIAGNOSIS — J449 Chronic obstructive pulmonary disease, unspecified: Secondary | ICD-10-CM | POA: Diagnosis not present

## 2021-07-10 DIAGNOSIS — R197 Diarrhea, unspecified: Secondary | ICD-10-CM | POA: Diagnosis not present

## 2021-07-10 DIAGNOSIS — C250 Malignant neoplasm of head of pancreas: Secondary | ICD-10-CM | POA: Diagnosis present

## 2021-07-10 MED ORDER — LORAZEPAM 0.5 MG PO TABS: 0.5000 mg | ORAL_TABLET | Freq: Three times a day (TID) | ORAL | 0 refills | Status: AC | PRN

## 2021-07-10 NOTE — Assessment & Plan Note (Addendum)
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 Currently s/p palliative radiation to the pancreas;  currently s/p  Radiation [10/11]. CT scan Feb 11th, 2023- Interval increased low-density within the pancreatic head surrounding the biliary stent with soft tissue stranding in the adjacent retroperitoneal fat and mild duodenal wall thickening, attributed to radiation therapy. No measurable residual pancreatic head mass or definite metastases identified. MRI/MRCP- MARCH 2023-Mild intrahepatic biliary duct dilatation with abrupt tapering of the common duct at or just proximal to the proximal end of the biliary stent". ? ?#Given the declining performance status; severe malnutrition/recurrent admission to hospital-I would not recommend any palliative chemotherapy.  I suspect patient will have extreme poor tolerance to therapy.  Patient at high risk of complications from chemotherapy.  I would recommend hospice/see below ? ?#Abdominal discomfort/ascites-likely secondary to underlying malignancy/biliary obstruction status post stent exchange; improved monitor for now closely.  Recommend repeat paracentesis in approximately 1 week. ? ?# COPD- on Advair/  xopenex prn- STABLE ? ?# Chronic Diarrhea/adominal pain - on nocro qhs prn-STABLE.  ? ?# Anxiety: start ativan 0.5 mg q 8 hours prnx - sent.  ? ?# Given the incurable nature of the disease /poor tolerance of therapy and in general less than 6 months of life expectancy-I introduced hospice philosophy to the patient and family.  Discussed that goal of care should be directed to symptom management rather than treating the underlying disease; and in the process help improve quality of life rather than quantity.   Discussed with hospice team would include-nurse, nurse aide, social worker and chaplain for help take care of patient with physical/emotional needs.  Patient aware  of hospice services [father mother went through hospice in 2008].  ? ?* call son- [phone] ? ?# DISPOSITION: ?# Referral to hospice re: pancreatic cancer ASAP ?# US paracentesis in 1 week ?# follow up in 2 weeks- MD; labs-cbc/cmp;ca-19-9; port- ?# Follow up in 2 weeks- Josh-  Dr.B ? ?# 40 minutes face-to-face with the patient discussing the above plan of care; more than 50% of time spent on prognosis/ natural history; counseling and coordination. ? ? ? ? ? ? ? ? ?

## 2021-07-10 NOTE — Telephone Encounter (Signed)
Schedule request for Paracentesis to be done next week faxed to specialty scheduling.   ?

## 2021-07-10 NOTE — Progress Notes (Signed)
Shirley Barton ?CONSULT NOTE ? ?Patient Care Team: ?Erma Heritage, MD as PCP - General (Family Medicine) ?Rico Junker, RN as Registered Nurse ?Theodore Demark, RN as Registered Nurse ?Adron Bene as Physician Assistant (Gastroenterology) ?Robert Bellow, MD (General Surgery) ?Clent Jacks, RN as Oncology Nurse Navigator ?Cammie Sickle, MD as Consulting Physician (Hematology and Oncology) ? ?CHIEF COMPLAINTS/PURPOSE OF CONSULTATION: Pancreatic cancer ? ?#  ?Oncology History Overview Note  ?# MAY 2022-pancreatic adenocarcinoma [obstructive jaundice-]MRI-18 mm pancreatic head mass; EUS [[facility]; Dr.Spaete]-abutment of portal vein/SMV; no lymphadenopathy noted; May 5 CA [id number]; s/p stenting -May 25th-47.  ? ?#Obstructive jaundice-s/p ERCP stenting [Dr.Wohl] ? ?MAY 2022- ENDOSONOGRAPHIC FINDING: : ?A round mass was identified in the pancreatic head.  ?The mass was hypoechoic. The mass measured 16 mm by 17  ?mm in maximal cross-sectional diameter. The outer  ?margins were irregular. There was sonographic evidence  ?suggesting invasion into the portal vein (manifested  ?by abutment) and the superior mesenteric vein  ?(manifested by abutment).  ? ?#Pancreatic adenocarcinoma pT1 however on EUS/CT [[facility]. Dr.Shah]-concerning for tumor abutting the celiac axis /common hepatic artery /portal vein/superior mesenteric vein; without any metastatic disease.  Borderline resectable Elevated CA 19-9.  [S/p evaluation at [facility] ? ?# June 9th- FOLFIRINOX cycle #1; discontinued because of poor GI tolerance ? ?# &/01-2021-gemcitabine Abraxane ['100mg'$ /2] day 1 day 815 ? ?# July 11th-gemcitabine and Abraxane day.  November 15, 2020-CT scan progressive disease/poor tolerance to therapy ? ?# AUG 29th, 2022-gemcitabine single agent weekly-concurrent radiation;  ?December 12, 2020-discontinued gemcitabine single agent because of significant respiratory distress post gemcitabine infusion.   Continue local radiation [finished OCT 2094] ?---------------------------------------------------------  ? ? ?  ?Malignant neoplasm of head of pancreas ([place])  ?08/24/2020 Initial Diagnosis  ? Malignant neoplasm of head of pancreas [facility]) ?  ?09/09/2020 - 09/12/2020 Chemotherapy  ?  ? ?  ? ?  ?10/10/2020 - 12/06/2020 Chemotherapy  ? Patient is on Treatment Plan : PANCREATIC Abraxane / Gemcitabine D1,8,15 q28d  ?   ?06/26/2021 -  Chemotherapy  ? Patient is on Treatment Plan : PANCREAS Liposomal Irinotecan / Leucovorin / 5-FU  q14d  ?   ? ? ? ?HISTORY OF PRESENTING ILLNESS: Caucasian female patient ambulating i in a wheelchair.  Accompanied by her son. ? ?MALIK RUFFINO 71 y.o.  female with a history of COPD; chronic abdominal pain-pancreatic cancer with poor tolerance to  chemotherapy-lmetastatic disease-currently on radiation to the pancreas on a palliative basis is here for follow-up/review results of the CT scan/MRCP ? ?Patient was recently admitted to hospital for biliary stent obstruction s/p re-exchange.  Also had ascites noted on CT scan-s/p paracentesis.  Fluid was transudative; cytology negative for malignancy. ? ?Patient continues a poor appetite.  Complains of nausea; [place] and vomiting.  She continues to take hydrocodone every 4-6 hours. ? ?Review of Systems  ?Constitutional:  Positive for malaise/fatigue and weight loss. Negative for chills, diaphoresis and fever.  ?HENT:  Negative for nosebleeds and sore throat.   ?Eyes:  Negative for double vision.  ?Respiratory:  Positive for shortness of breath. Negative for cough, hemoptysis, sputum production and wheezing.   ?Cardiovascular:  Negative for chest pain, palpitations, orthopnea and leg swelling.  ?Gastrointestinal:  Positive for abdominal pain and diarrhea. Negative for blood in stool, constipation, heartburn, melena and vomiting.  ?Genitourinary:  Negative for dysuria, frequency and urgency.  ?Musculoskeletal:  Negative for back pain and joint pain.  ?Skin:  Negative.  Negative  for itching and rash.  ?Neurological:  Negative for dizziness, tingling, focal weakness, weakness and headaches.  ?Endo/Heme/Allergies:  Does not bruise/bleed easily.  ?Psychiatric/Behavioral:  Negative for depression. The patient is not nervous/anxious and does not have insomnia.    ? ?MEDICAL HISTORY:  ?Past Medical History:  ?Diagnosis Date  ? Abdominal pain, generalized   ? Anxiety   ? Atypical chest pain   ? Back pain   ? Basal cell carcinoma of skin 2011-06-17  ? resected from Left scalp area.   ? COPD (chronic obstructive pulmonary disease) ([place])   ? Depression   ? DOE (dyspnea on exertion)   ? GERD (gastroesophageal reflux disease)   ? Hypertension   ? Pancreatic cancer ([place])   ? Shortness of breath dyspnea   ? ? ?SURGICAL HISTORY: ?Past Surgical History:  ?Procedure Laterality Date  ? BASAL CELL CARCINOMA EXCISION  06/17/2011  ? CARPAL TUNNEL RELEASE Left 90s  ? CHOLECYSTECTOMY N/A 09/16/2015  ? Procedure: LAPAROSCOPIC CHOLECYSTECTOMY WITH INTRAOPERATIVE CHOLANGIOGRAM;  Surgeon: Robert Bellow, MD;  Location: [facility] ORS;  Service: General;  Laterality: N/A;  ? COLONOSCOPY  1990"s  ? [place]  ? ENDOSCOPIC RETROGRADE CHOLANGIOPANCREATOGRAPHY (ERCP) WITH PROPOFOL N/A 11/10/2020  ? Procedure: ENDOSCOPIC RETROGRADE CHOLANGIOPANCREATOGRAPHY (ERCP) WITH PROPOFOL;  Surgeon: Lucilla Lame, MD;  Location: [facility] ENDOSCOPY;  Service: Endoscopy;  Laterality: N/A;  ? ERCP N/A 08/05/2020  ? Procedure: ENDOSCOPIC RETROGRADE CHOLANGIOPANCREATOGRAPHY (ERCP);  Surgeon: Lucilla Lame, MD;  Location: [facility] ENDOSCOPY;  Service: Endoscopy;  Laterality: N/A;  ? ERCP N/A 06/06/2021  ? Procedure: ENDOSCOPIC RETROGRADE CHOLANGIOPANCREATOGRAPHY (ERCP);  Surgeon: Lucilla Lame, MD;  Location[facility] ENDOSCOPY;  Service: Endoscopy;  Laterality: N/A;  ? ERCP N/A 06/30/2021  ? Procedure: ENDOSCOPIC RETROGRADE CHOLANGIOPANCREATOGRAPHY (ERCP);  Surgeon: Lucilla Lame, MD;  Location[facility] ENDOSCOPY;  Service: Endoscopy;  Laterality: N/A;  ?  ESOPHAGOGASTRODUODENOSCOPY (EGD) WITH PROPOFOL N/A 02/05/2017  ? Procedure: ESOPHAGOGASTRODUODENOSCOPY (EGD) WITH PROPOFOL;  Surgeon: Lollie Sails, MD;  Location[facility] ENDOSCOPY;  Service: Endoscopy;  Laterality: N/A;  ? PERIPHERAL VASCULAR CATHETERIZATION Bilateral 12/27/2015  ? Procedure: Lower Extremity Angiography;  Surgeon: Katha Cabal, MD;  Location: [facility] CV LAB;  Service: Cardiovascular;  Laterality: Bilateral;  ? PORTA CATH INSERTION N/A 09/08/2020  ? Procedure: PORTA CATH INSERTION;  Surgeon: Algernon Huxley, MD;  Location: [place] CV LAB;  Service: Cardiovascular;  Laterality: N/A;  ? TONSILLECTOMY    ? ? ?SOCIAL HISTORY: ?Social History  ? ?Socioeconomic History  ? Marital status: Divorced  ?  Spouse name: Not on file  ? Number of children: Not on file  ? Years of education: Not on file  ? Highest education level: Not on file  ?Occupational History  ? Occupation: retired  ?Tobacco Use  ? Smoking status: Former  ?  Types: Cigarettes  ?  Quit date: 03/02/2012  ?  Years since quitting: 9.3  ? Smokeless tobacco: Never  ?Vaping Use  ? Vaping Use: Never used  ?Substance and Sexual Activity  ? Alcohol use: No  ?  Alcohol/week: 0.0 standard drinks  ? Drug use: No  ? Sexual activity: Not on file  ?Other Topics Concern  ? Not on file  ?Social History Narrative  ? [place]; self; smoker- quit 06-17-11; daughter died of leukemia-2020. 2 sons [[place]; [place]]; used to be caregiver/ retd.   ? ?Social Determinants of Health  ? ?Financial Resource Strain: Not on file  ?Food Insecurity: Not on file  ?Transportation Needs: Not on file  ?Physical Activity: Not on  file  ?Stress: Not on file  ?Social Connections: Not on file  ?Intimate Partner Violence: Not on file  ? ? ?FAMILY HISTORY: ?Family History  ?Problem Relation Age of Onset  ? Cerebral palsy Daughter   ? Colon polyps Sister   ? Leukemia Daughter   ? Depression Son   ? Breast cancer Neg Hx   ? ? ?ALLERGIES:  is allergic to  tramadol. ? ?MEDICATIONS:  ?Current Outpatient Medications  ?Medication Sig Dispense Refill  ? acetaminophen (TYLENOL) 325 MG tablet Take 650 mg by mouth every 6 (six) hours as needed for mild pain or moderate pain.    ? B

## 2021-07-10 NOTE — Telephone Encounter (Signed)
Recv call from [facility] stating that family has requested for Dr.Brahmanday to serve as Hospice Attending.  ?

## 2021-07-10 NOTE — Progress Notes (Signed)
Pt states weight gain may be due to fluid retention. Feet are swelling, since 07/05/2021 when d/c from the hospital. ?

## 2021-07-11 ENCOUNTER — Encounter: Payer: Self-pay | Admitting: Internal Medicine

## 2021-07-11 NOTE — Telephone Encounter (Signed)
LVM for [facility] stating that DR.B has agreed to be attending Provider for pt KJ  ?

## 2021-07-11 NOTE — Telephone Encounter (Signed)
Patient informed of paracentesis on 07/18/21 arrive at 12:30 for 1:00 appt ?

## 2021-07-13 ENCOUNTER — Ambulatory Visit: Payer: Medicaid Other | Admitting: Internal Medicine

## 2021-07-13 ENCOUNTER — Other Ambulatory Visit: Payer: Medicaid Other

## 2021-07-14 ENCOUNTER — Telehealth: Payer: Self-pay | Admitting: *Deleted

## 2021-07-14 ENCOUNTER — Other Ambulatory Visit: Payer: Self-pay | Admitting: Internal Medicine

## 2021-07-14 MED ORDER — OXYCODONE HCL 10 MG PO TABS: ORAL_TABLET | ORAL | 0 refills | Status: AC

## 2021-07-14 NOTE — Telephone Encounter (Signed)
Patient needs something stronger for pain as the Norco every 6 hours is no longer working patient does not want Morphine. Please advise Mitzi of new orders [phone]. ?Patient uses [place] ?

## 2021-07-14 NOTE — Progress Notes (Signed)
I spoke to hospice nurse, Mitzi-we will start the patient on oxycodone 10 mg 4 to 6 hours.  Prescription sent to [place] ? ?GB ?

## 2021-07-17 ENCOUNTER — Encounter: Payer: Self-pay | Admitting: Internal Medicine

## 2021-07-18 ENCOUNTER — Telehealth: Payer: Self-pay | Admitting: *Deleted

## 2021-07-18 ENCOUNTER — Ambulatory Visit: Admission: RE | Admit: 2021-07-18 | Payer: Medicare HMO | Source: Ambulatory Visit

## 2021-07-18 NOTE — Telephone Encounter (Signed)
[place] in [place] called reporting that patient did NOT show up for her [place] this morning ?

## 2021-07-18 NOTE — Telephone Encounter (Signed)
See phone note from Dr. B 

## 2021-07-19 ENCOUNTER — Telehealth: Payer: Self-pay | Admitting: Internal Medicine

## 2021-07-19 NOTE — Telephone Encounter (Signed)
Called patient to f/u after she missed her appointment on 4/19 for US PARACENTESIS. Patient stated that she does not want to r/s at this time because she felt like she didn't "need it anymore", but would discuss with her hospice nurse.  ? ?Routing to clinical team to make aware.  ?

## 2021-07-19 NOTE — Telephone Encounter (Signed)
Noted  

## 2021-07-25 ENCOUNTER — Inpatient Hospital Stay: Payer: Medicare HMO | Admitting: Hospice and Palliative Medicine

## 2021-07-25 ENCOUNTER — Inpatient Hospital Stay: Payer: Medicare HMO | Admitting: Internal Medicine

## 2021-07-25 ENCOUNTER — Inpatient Hospital Stay: Payer: Medicare HMO

## 2021-07-28 ENCOUNTER — Telehealth: Payer: Self-pay | Admitting: *Deleted

## 2021-07-28 ENCOUNTER — Other Ambulatory Visit: Payer: Self-pay | Admitting: Hospice and Palliative Medicine

## 2021-07-28 MED ORDER — FENTANYL 25 MCG/HR TD PT72
1.0000 | MEDICATED_PATCH | TRANSDERMAL | 0 refills | Status: AC
Start: 2021-07-28 — End: ?

## 2021-07-28 NOTE — Telephone Encounter (Addendum)
Shirley Barton with hospice called reporting that patient pain is uncontrolled with her current medicine. She is rating her pain as a 6/10 2 hour after taking her Oxycodone 10 mg which she takes every 4 hours Please advise ?

## 2021-07-28 NOTE — Progress Notes (Signed)
Call from hospice nurse, Vivien Rota.  Patient has poorly controlled generalized pain despite taking oxycodone 10 mg about every 4 hours around-the-clock.  We will start transdermal fentanyl 25 mcg every 72 hours.  Continue oxycodone as needed for breakthrough pain.  Continue daily bowel regimen. ?

## 2021-08-01 ENCOUNTER — Ambulatory Visit: Payer: Medicaid Other | Admitting: Gastroenterology

## 2021-08-04 ENCOUNTER — Inpatient Hospital Stay: Payer: Medicare HMO

## 2021-08-30 ENCOUNTER — Encounter: Payer: Self-pay | Admitting: Internal Medicine

## 2021-08-31 NOTE — Progress Notes (Signed)
I spoke to patient son express my condolences. son was thankful for the call and the care provided the cancer center.

## 2021-08-31 DEATH — deceased

## 2021-10-17 ENCOUNTER — Ambulatory Visit (INDEPENDENT_AMBULATORY_CARE_PROVIDER_SITE_OTHER): Payer: Medicare HMO | Admitting: Vascular Surgery

## 2021-10-17 ENCOUNTER — Other Ambulatory Visit (INDEPENDENT_AMBULATORY_CARE_PROVIDER_SITE_OTHER): Payer: Medicare HMO

## 2021-10-17 ENCOUNTER — Encounter (INDEPENDENT_AMBULATORY_CARE_PROVIDER_SITE_OTHER): Payer: Medicare HMO

## 2021-12-18 ENCOUNTER — Ambulatory Visit: Payer: Medicare HMO | Admitting: Radiation Oncology
